# Patient Record
Sex: Female | Born: 1985 | Race: White | Hispanic: No | Smoking: Current every day smoker
Health system: Southern US, Community
[De-identification: ages and names within clinical notes are randomized; demographics above are authoritative.]

## PROBLEM LIST (undated history)

## (undated) DIAGNOSIS — F32A Depression, unspecified: Secondary | ICD-10-CM

## (undated) DIAGNOSIS — G43909 Migraine, unspecified, not intractable, without status migrainosus: Secondary | ICD-10-CM

## (undated) DIAGNOSIS — F319 Bipolar disorder, unspecified: Secondary | ICD-10-CM

## (undated) DIAGNOSIS — H269 Unspecified cataract: Secondary | ICD-10-CM

## (undated) DIAGNOSIS — F419 Anxiety disorder, unspecified: Secondary | ICD-10-CM

## (undated) DIAGNOSIS — K219 Gastro-esophageal reflux disease without esophagitis: Secondary | ICD-10-CM

## (undated) DIAGNOSIS — F99 Mental disorder, not otherwise specified: Secondary | ICD-10-CM

## (undated) DIAGNOSIS — F329 Major depressive disorder, single episode, unspecified: Secondary | ICD-10-CM

## (undated) HISTORY — PX: ABDOMINAL SURGERY: SHX537

## (undated) HISTORY — PX: TUBAL LIGATION: SHX77

## (undated) HISTORY — DX: Unspecified cataract: H26.9

## (undated) HISTORY — PX: CHOLECYSTECTOMY: SHX55

## (undated) HISTORY — PX: HERNIA REPAIR: SHX51

## (undated) HISTORY — DX: Migraine, unspecified, not intractable, without status migrainosus: G43.909

---
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
## 2006-05-02 ENCOUNTER — Emergency Department (HOSPITAL_COMMUNITY): Admission: EM | Admit: 2006-05-02 | Discharge: 2006-05-02 | Payer: Self-pay | Admitting: Emergency Medicine

## 2006-05-04 ENCOUNTER — Emergency Department (HOSPITAL_COMMUNITY): Admission: EM | Admit: 2006-05-04 | Discharge: 2006-05-04 | Payer: Self-pay | Admitting: Family Medicine

## 2007-01-03 ENCOUNTER — Emergency Department (HOSPITAL_COMMUNITY): Admission: EM | Admit: 2007-01-03 | Discharge: 2007-01-03 | Payer: Self-pay | Admitting: Emergency Medicine

## 2007-02-06 ENCOUNTER — Emergency Department (HOSPITAL_COMMUNITY): Admission: EM | Admit: 2007-02-06 | Discharge: 2007-02-06 | Payer: Self-pay | Admitting: *Deleted

## 2007-07-11 ENCOUNTER — Inpatient Hospital Stay (HOSPITAL_COMMUNITY): Admission: AD | Admit: 2007-07-11 | Discharge: 2007-07-11 | Payer: Self-pay | Admitting: Obstetrics and Gynecology

## 2007-08-01 ENCOUNTER — Inpatient Hospital Stay (HOSPITAL_COMMUNITY): Admission: AD | Admit: 2007-08-01 | Discharge: 2007-08-01 | Payer: Self-pay | Admitting: Obstetrics and Gynecology

## 2007-08-01 ENCOUNTER — Ambulatory Visit (HOSPITAL_COMMUNITY): Admission: RE | Admit: 2007-08-01 | Discharge: 2007-08-01 | Payer: Self-pay | Admitting: Neurology

## 2007-09-16 ENCOUNTER — Inpatient Hospital Stay (HOSPITAL_COMMUNITY): Admission: AD | Admit: 2007-09-16 | Discharge: 2007-09-16 | Payer: Self-pay | Admitting: Obstetrics and Gynecology

## 2007-10-05 ENCOUNTER — Emergency Department (HOSPITAL_COMMUNITY): Admission: EM | Admit: 2007-10-05 | Discharge: 2007-10-05 | Payer: Self-pay | Admitting: Emergency Medicine

## 2007-10-13 ENCOUNTER — Inpatient Hospital Stay (HOSPITAL_COMMUNITY): Admission: RE | Admit: 2007-10-13 | Discharge: 2007-10-15 | Payer: Self-pay | Admitting: Obstetrics and Gynecology

## 2008-08-26 ENCOUNTER — Emergency Department (HOSPITAL_COMMUNITY): Admission: EM | Admit: 2008-08-26 | Discharge: 2008-08-26 | Payer: Self-pay | Admitting: Emergency Medicine

## 2008-08-30 ENCOUNTER — Emergency Department (HOSPITAL_COMMUNITY): Admission: EM | Admit: 2008-08-30 | Discharge: 2008-08-30 | Payer: Self-pay | Admitting: Family Medicine

## 2008-09-15 ENCOUNTER — Emergency Department (HOSPITAL_COMMUNITY): Admission: EM | Admit: 2008-09-15 | Discharge: 2008-09-15 | Payer: Self-pay | Admitting: Emergency Medicine

## 2009-09-11 ENCOUNTER — Emergency Department (HOSPITAL_COMMUNITY): Admission: EM | Admit: 2009-09-11 | Discharge: 2009-09-11 | Payer: Self-pay | Admitting: Family Medicine

## 2009-09-30 ENCOUNTER — Emergency Department (HOSPITAL_COMMUNITY): Admission: EM | Admit: 2009-09-30 | Discharge: 2009-09-30 | Payer: Self-pay | Admitting: Family Medicine

## 2009-11-14 ENCOUNTER — Inpatient Hospital Stay (HOSPITAL_COMMUNITY): Admission: AD | Admit: 2009-11-14 | Discharge: 2009-11-15 | Payer: Self-pay | Admitting: Obstetrics and Gynecology

## 2009-12-10 ENCOUNTER — Emergency Department (HOSPITAL_COMMUNITY): Admission: EM | Admit: 2009-12-10 | Discharge: 2009-12-10 | Payer: Self-pay | Admitting: Emergency Medicine

## 2009-12-23 ENCOUNTER — Ambulatory Visit: Payer: Self-pay | Admitting: Psychiatry

## 2009-12-23 ENCOUNTER — Inpatient Hospital Stay (HOSPITAL_COMMUNITY): Admission: AD | Admit: 2009-12-23 | Discharge: 2009-12-29 | Payer: Self-pay | Admitting: Psychiatry

## 2010-03-21 ENCOUNTER — Inpatient Hospital Stay (HOSPITAL_COMMUNITY): Admission: AD | Admit: 2010-03-21 | Discharge: 2010-03-21 | Payer: Self-pay | Admitting: Obstetrics and Gynecology

## 2010-03-21 ENCOUNTER — Ambulatory Visit: Payer: Self-pay | Admitting: Advanced Practice Midwife

## 2010-04-07 ENCOUNTER — Inpatient Hospital Stay (HOSPITAL_COMMUNITY): Admission: AD | Admit: 2010-04-07 | Discharge: 2010-04-07 | Payer: Self-pay | Admitting: Obstetrics and Gynecology

## 2010-04-07 ENCOUNTER — Ambulatory Visit: Payer: Self-pay | Admitting: Nurse Practitioner

## 2010-04-20 ENCOUNTER — Inpatient Hospital Stay (HOSPITAL_COMMUNITY): Admission: AD | Admit: 2010-04-20 | Discharge: 2010-04-20 | Payer: Self-pay | Admitting: Obstetrics and Gynecology

## 2010-04-20 ENCOUNTER — Ambulatory Visit: Payer: Self-pay | Admitting: Family

## 2010-06-03 ENCOUNTER — Inpatient Hospital Stay (HOSPITAL_COMMUNITY): Admission: RE | Admit: 2010-06-03 | Discharge: 2010-06-05 | Payer: Self-pay | Admitting: Obstetrics and Gynecology

## 2010-08-05 ENCOUNTER — Ambulatory Visit (HOSPITAL_COMMUNITY)
Admission: RE | Admit: 2010-08-05 | Discharge: 2010-08-05 | Payer: Self-pay | Source: Home / Self Care | Attending: Obstetrics and Gynecology | Admitting: Obstetrics and Gynecology

## 2010-08-30 HISTORY — PX: OOPHORECTOMY: SHX86

## 2010-09-20 ENCOUNTER — Encounter: Payer: Self-pay | Admitting: Emergency Medicine

## 2010-11-09 LAB — CBC
HCT: 39.2 % (ref 36.0–46.0)
MCHC: 34.1 g/dL (ref 30.0–36.0)
RBC: 4.11 MIL/uL (ref 3.87–5.11)
WBC: 5.5 10*3/uL (ref 4.0–10.5)

## 2010-11-09 LAB — SURGICAL PCR SCREEN
MRSA, PCR: NEGATIVE
Staphylococcus aureus: NEGATIVE

## 2010-11-11 LAB — CBC
Hemoglobin: 11.9 g/dL — ABNORMAL LOW (ref 12.0–15.0)
Hemoglobin: 12.6 g/dL (ref 12.0–15.0)
MCH: 34.1 pg — ABNORMAL HIGH (ref 26.0–34.0)
MCHC: 34.8 g/dL (ref 30.0–36.0)
MCV: 96.3 fL (ref 78.0–100.0)
RBC: 3.47 MIL/uL — ABNORMAL LOW (ref 3.87–5.11)
RBC: 3.73 MIL/uL — ABNORMAL LOW (ref 3.87–5.11)
RDW: 14.2 % (ref 11.5–15.5)
WBC: 8.5 10*3/uL (ref 4.0–10.5)

## 2010-11-12 LAB — FETAL FIBRONECTIN: Fetal Fibronectin: NEGATIVE

## 2010-11-12 LAB — URINALYSIS, ROUTINE W REFLEX MICROSCOPIC
Nitrite: NEGATIVE
Specific Gravity, Urine: 1.01 (ref 1.005–1.030)
Urobilinogen, UA: 2 mg/dL — ABNORMAL HIGH (ref 0.0–1.0)

## 2010-11-13 LAB — URINALYSIS, ROUTINE W REFLEX MICROSCOPIC
Hgb urine dipstick: NEGATIVE
Ketones, ur: >80 mg/dL — AB
Nitrite: NEGATIVE
Urobilinogen, UA: 4 mg/dL — ABNORMAL HIGH (ref 0.0–1.0)
pH: 6.5 (ref 5.0–8.0)

## 2010-11-13 LAB — CBC
HCT: 37.1 % (ref 36.0–46.0)
Hemoglobin: 12.9 g/dL (ref 12.0–15.0)
MCH: 34.1 pg — ABNORMAL HIGH (ref 26.0–34.0)
MCV: 98.2 fL (ref 78.0–100.0)
WBC: 7.2 10*3/uL (ref 4.0–10.5)

## 2010-11-13 LAB — COMPREHENSIVE METABOLIC PANEL
AST: 18 U/L (ref 0–37)
Albumin: 3 g/dL — ABNORMAL LOW (ref 3.5–5.2)
Calcium: 8.5 mg/dL (ref 8.4–10.5)
Chloride: 102 mEq/L (ref 96–112)
Glucose, Bld: 79 mg/dL (ref 70–99)
Total Protein: 6.1 g/dL (ref 6.0–8.3)

## 2010-11-14 LAB — URINALYSIS, ROUTINE W REFLEX MICROSCOPIC
Bilirubin Urine: NEGATIVE
Hgb urine dipstick: NEGATIVE
Ketones, ur: NEGATIVE mg/dL
Nitrite: NEGATIVE
Urobilinogen, UA: 0.2 mg/dL (ref 0.0–1.0)
pH: 7 (ref 5.0–8.0)

## 2010-11-14 LAB — URINE MICROSCOPIC-ADD ON

## 2010-11-14 LAB — WET PREP, GENITAL: Trich, Wet Prep: NONE SEEN

## 2010-11-14 LAB — GC/CHLAMYDIA PROBE AMP, GENITAL: Chlamydia, DNA Probe: NEGATIVE

## 2010-11-14 LAB — URINE CULTURE

## 2010-11-17 LAB — COMPREHENSIVE METABOLIC PANEL
Albumin: 2.8 g/dL — ABNORMAL LOW (ref 3.5–5.2)
BUN: 4 mg/dL — ABNORMAL LOW (ref 6–23)
CO2: 21 mEq/L (ref 19–32)
Chloride: 109 mEq/L (ref 96–112)
Creatinine, Ser: 0.38 mg/dL — ABNORMAL LOW (ref 0.4–1.2)
Glucose, Bld: 89 mg/dL (ref 70–99)
Potassium: 3.2 mEq/L — ABNORMAL LOW (ref 3.5–5.1)
Sodium: 135 mEq/L (ref 135–145)

## 2010-11-17 LAB — RAPID URINE DRUG SCREEN, HOSP PERFORMED
Amphetamines: NOT DETECTED
Benzodiazepines: NOT DETECTED
Tetrahydrocannabinol: POSITIVE — AB

## 2010-11-17 LAB — URINE MICROSCOPIC-ADD ON

## 2010-11-17 LAB — URINALYSIS, ROUTINE W REFLEX MICROSCOPIC
Bilirubin Urine: NEGATIVE
Specific Gravity, Urine: 1.012 (ref 1.005–1.030)
Urobilinogen, UA: 1 mg/dL (ref 0.0–1.0)
pH: 6 (ref 5.0–8.0)

## 2010-11-17 LAB — CBC
Hemoglobin: 12.6 g/dL (ref 12.0–15.0)
MCHC: 35.9 g/dL (ref 30.0–36.0)
Platelets: ADEQUATE 10*3/uL (ref 150–400)
RBC: 3.73 MIL/uL — ABNORMAL LOW (ref 3.87–5.11)
RDW: 13 % (ref 11.5–15.5)

## 2010-11-17 LAB — TRICYCLICS SCREEN, URINE: TCA Scrn: NOT DETECTED

## 2010-11-18 LAB — POCT I-STAT, CHEM 8
BUN: <3 mg/dL — ABNORMAL LOW (ref 6–23)
Calcium, Ion: 1.03 mmol/L — ABNORMAL LOW (ref 1.12–1.32)
Chloride: 108 mEq/L (ref 96–112)
HCT: 39 % (ref 36.0–46.0)
Sodium: 138 mEq/L (ref 135–145)
TCO2: 17 mmol/L (ref 0–100)

## 2010-11-18 LAB — DIFFERENTIAL
Basophils Absolute: 0.1 10*3/uL (ref 0.0–0.1)
Basophils Relative: 1 % (ref 0–1)
Eosinophils Absolute: 0 10*3/uL (ref 0.0–0.7)
Monocytes Relative: 4 % (ref 3–12)
Neutro Abs: 6.1 10*3/uL (ref 1.7–7.7)
Neutrophils Relative %: 73 % (ref 43–77)

## 2010-11-18 LAB — BASIC METABOLIC PANEL
BUN: 3 mg/dL — ABNORMAL LOW (ref 6–23)
Calcium: 8.7 mg/dL (ref 8.4–10.5)
GFR calc non Af Amer: >60 mL/min (ref >60)
Glucose, Bld: 80 mg/dL (ref 70–99)
Potassium: 3.4 mEq/L — ABNORMAL LOW (ref 3.5–5.1)
Sodium: 137 mEq/L (ref 135–145)

## 2010-11-18 LAB — RAPID URINE DRUG SCREEN, HOSP PERFORMED
Amphetamines: NOT DETECTED
Barbiturates: NOT DETECTED
Tetrahydrocannabinol: POSITIVE — AB

## 2010-11-18 LAB — CBC
MCHC: 35.8 g/dL (ref 30.0–36.0)
MCV: 94.2 fL (ref 78.0–100.0)
RDW: 13 % (ref 11.5–15.5)

## 2010-11-18 LAB — POCT PREGNANCY, URINE: Preg Test, Ur: POSITIVE

## 2010-11-18 LAB — ETHANOL: Alcohol, Ethyl (B): <5 mg/dL (ref 0–10)

## 2010-11-22 LAB — CBC
HCT: 37 % (ref 36.0–46.0)
Hemoglobin: 12.6 g/dL (ref 12.0–15.0)
RBC: 3.87 MIL/uL (ref 3.87–5.11)
RDW: 13.4 % (ref 11.5–15.5)
WBC: 7.8 10*3/uL (ref 4.0–10.5)

## 2010-11-22 LAB — URINALYSIS, ROUTINE W REFLEX MICROSCOPIC
Glucose, UA: NEGATIVE mg/dL
Hgb urine dipstick: NEGATIVE
Specific Gravity, Urine: >1.03 — ABNORMAL HIGH (ref 1.005–1.030)
pH: 6 (ref 5.0–8.0)

## 2010-11-22 LAB — RAPID URINE DRUG SCREEN, HOSP PERFORMED
Amphetamines: NOT DETECTED
Benzodiazepines: POSITIVE — AB
Cocaine: NOT DETECTED
Opiates: NOT DETECTED
Tetrahydrocannabinol: POSITIVE — AB

## 2010-11-22 LAB — GC/CHLAMYDIA PROBE AMP, GENITAL: GC Probe Amp, Genital: NEGATIVE

## 2010-11-22 LAB — ETHANOL: Alcohol, Ethyl (B): <5 mg/dL (ref 0–10)

## 2010-12-14 LAB — URINALYSIS, ROUTINE W REFLEX MICROSCOPIC
Bilirubin Urine: NEGATIVE
Hgb urine dipstick: NEGATIVE
Specific Gravity, Urine: 1.025 (ref 1.005–1.030)
Urobilinogen, UA: 0.2 mg/dL (ref 0.0–1.0)

## 2010-12-14 LAB — PREGNANCY, URINE: Preg Test, Ur: NEGATIVE

## 2011-01-12 NOTE — Discharge Summary (Signed)
NAMEAILEEN, [name] NO.:  [id number]   MEDICAL RECORD NO.:  [id number]          PATIENT TYPE:  INP   LOCATION:  9121                          FACILITY:  [facility]   PHYSICIAN:  Huel Cote, M.D. DATE OF BIRTH:  Jun 25, 1986   DATE OF ADMISSION:  10/13/2007  DATE OF DISCHARGE:  10/15/2007                               DISCHARGE SUMMARY   DISCHARGE DIAGNOSES:  1. Term pregnancy at 39 weeks delivered.  2. Status post normal spontaneous vaginal delivery.   DISCHARGE MEDICATIONS:  1. Motrin 600 mg p.o. every 6 hours.  2. Percocet 1 to 2 tablets p.o. every 4 ours p.r.n.   DISCHARGE FOLLOWUP:  The patient to follow up in the office in 6 weeks  for her routine postpartum  examination and probably a followup  ultrasound of the right ovarian cyst which was noted in early pregnancy.   The patient is a 25 year old G2, P0-0-1-0 who is admitted at 3 plus  weeks gestation for induction of labor given term status and favorable  cervix.  Prenatal care was complicated by her positive smoker status  which she cut down and is quitting with pregnancy.  There is no other  significant problems.  She did have a right ovarian cyst that was 2.5 cm  demonstrated at the time of her prenatal care which remained stable in  size but will need to be followed up postpartum.  She had an early  positive marijuana screen in July 2008, however, no use after that time.   Prenatal labs are as follows:  Rh positive, antibody negative, RPR  nonreactive, rubella immune, Hepatitis B surface antigen negative, HIV  negative, GC and Chlamydia negative.  Group B strep negative.   Past obstetrical history in 2004 shows spontaneous miscarriage.   PAST GYNECOLOGICAL HISTORY:  History of a LEEP and PAPS were normal  after that time.  There was a right ovarian cyst noted with pregnancy as  stated and the patient does have a history of mild endometriosis and  this will be followed up postpartum  with an  ultrasound.   PAST SURGICAL HISTORY:  In 2005, she had a laparoscopy with mild  endometriosis noted.   PAST MEDICAL HISTORY:  History of seizures as a child which resolved at  78 years of age without medications since then.   ALLERGIES:  None.   MEDICATIONS:  None except prenatal vitamins.   On admission she was afebrile with stable vital signs.  Fetal heart rate  was reactive.  Cervix was 52 at -2 station.  She had rupture of  membranes performed and clear fluid was noted.  She was placed on  Pitocin and progressed and reached complete diltation that evening,  pushing well with a normal spontaneous vaginal delivery of a vigorous  female infant over a small second degree laceration.  Apgar was 8 and 9.  Weight was 6 pounds 14 ounces.  Placenta was delivered spontaneously.  Secondary laceration was repaired with 2-0 Vicryl.  Estimated blood loss  was 350  ml.  Cervix and rectum were intact.  She was then admitted for routine  postpartum  care.  Did quite well.  On postpartum day 2, her hemoglobin  was 11.5.  By postoperative day #2 she was ambulating well. Taking her  medications p.o. and was stable for discharge to home.      Huel Cote, M.D.  Electronically Signed     KR/MEDQ  D:  10/15/2007  T:  10/16/2007  Job:  [id number]

## 2011-01-12 NOTE — Consult Note (Signed)
[name], [name] NO.:  [id number]   MEDICAL RECORD NO.:  [id number]          PATIENT TYPE:  MAT   LOCATION:  MATC                          FACILITY:  [facility]   PHYSICIAN:  Marlan Palau, M.D.  DATE OF BIRTH:  February 19, 1986   DATE OF CONSULTATION:  08/01/2007  DATE OF DISCHARGE:                                 CONSULTATION   HISTORY OF PRESENT ILLNESS:  [name] is a 25 year old right-  handed white female born 1986-06-11 with a history of seizures in the  past off medication since age 65 and history of what sounds like  migraine type headaches occurring two to three times a month.  This  patient presents with onset of a right hemisensory deficit that was  first noted upon awakening at 3:00 a.m. on August 01, 2007.  The  patient went to the bathroom at time came back and went back to sleep.  The patient awakened with the same deficit that has not changed a bit.  The patient went to [facility] hospital for further evaluation.  The  patient has developed a bit of a right frontotemporal headache that is  throbbing in nature without nausea, vomiting, visual field changes.  The  patient notes no other deficits such as weakness, vision changes, speech  changes.  Gait disturbance, with the above hemisensory deficit.  The  patient has never had a similar events with her headaches previously.  The patient comes to the [facility] hospital for further evaluation.  Neurology was called for evaluation.   PAST MEDICAL HISTORY:  1. New onset right hemisensory deficit as above.  2. Functional examination.  3. Current pregnancy with [redacted] weeks gestation.  4. Probable migraine headache.  5. History of seizures off medications for the last four years without      recurrence.  6. History of asthma.   The patient's prior seizures were described as generalized tonic-clonic  events.  The patient uses an albuterol inhaler if needed and is on  prenatal vitamins, has no known  allergies, does not smoke or drink.   SOCIAL HISTORY:  The patient is single, lives in the [place] area.  Does not work, lives with a boyfriend, has had one prior  pregnancy that terminated in miscarriage.  No problems with this  pregnancy have been noted.   Family medical history notable that both parents are alive and well.  The patient's mother has migraine.  Patient has two brothers, three  sisters who are alive and well.  History of diabetes, heart disease,  cancer and seizures in the family.   REVIEW OF SYSTEMS:  Notable for no recent fevers, chills.  The patient  does note headache.  Denies neck stiffness.  Does have some right  shoulder soreness today and has had some slight shortness of breath with  the pregnancy.  Denies any chest pain.  Denies abdominal pain, troubles  controlling bowels or bladder.  Denies any blackout episodes recently  but has had syncope in years past with seizures.  The patient did report  some slight dizziness today described as a  lightheaded sensation not  vertigo.   PHYSICAL EXAMINATION:  VITALS:  Blood pressure is 110/67, heart rate 91,  respiratory rate 20, temperature afebrile.  GENERAL:  The patient is a minimally obese pregnant female who is alert,  cooperative at time examination.  HEENT:  Head is atraumatic.  EYES:  Pupils are equal, round, react to  light.  Disks are flat bilaterally.  NECK:  Supple.  No carotid bruits noted.  RESPIRATORY:  Examination is clear.  CARDIOVASCULAR:  Revealed a regular rate and rhythm.  No obvious  murmurs, rubs noted.  EXTREMITIES:  Are without significant edema.  NEUROLOGIC EXAMINATION:  CRANIAL NERVES:  As above.  Facial symmetry is  present.  The patient has good sensation of the face on the left side,  slight decrease in the forehead on the right, is normal on the lower  face on the right.  The patient splits the midline with vibratory  sensation and has again full extraocular  movements, visual fields are  full.  Speech is well enunciated not aphasic.  Good strength of facial  muscles, muscles of head turn and shoulder shrug were noted bilaterally.  Good symmetric smile noted.  Motor test reveals 05/05 strength in all  fours.  Good symmetric motor is noted throughout.  Sensory testing is  notable for some decrease in pinprick and vibratory sensation of the  right arm, normal left.  Decreased pinprick sensation on the right leg  as compared the left, but symmetric vibratory sensation of the legs.  The patient has good finger-nose-finger and heel-to-shin bilaterally.  Gait was not tested.  No drift is seen in the upper extremities.  Deep  tendon reflexes symmetric, normal.  Toes are downgoing bilaterally.   Laboratory values notable for a urinalysis with specific gravity of  1.025, pH of 6.5, 11-20 white cells, many bacteria noted.   IMPRESSION:  1. New onset of right hemisensory deficit.  2. Functional examination.  3. Current pregnancy.  4. Urinary tract infection.  5. Prior history of seizures.   This patient has a subjective deficit at this point, splits midline with  vibratory sensation of the forehead suggestive of functional deficit.  Will need to pursue further workup to rule out cerebrovascular disease  or demyelinating disease with embellishment of examination.  The patient  does have a urinary tract infection that may require treatment.   PLAN:  1. MRI study of the brain.  2. MR angiogram of intracranial vessels.   If the two above studies are unremarkable, will plan discharge the  patient home.  If abnormalities are noted.  The patient may need to be  admitted for further evaluation.   I suppose neurologic migraine does need be considered in this case as  well.   INDICATIONS:  Please.  [facility], [address] 60[address]100  Dr. Samul Dada and Rudell Cobb, M.D.  Electronically Signed    CKW/MEDQ   D:  08/01/2007  T:  08/01/2007  Job:  [id number]   cc:   Malachi Pro. Ambrose Mantle, M.D.  Fax: [phone]

## 2011-01-15 NOTE — Op Note (Signed)
NAME:  [name], [name]                      ACCOUNT NO.:  [id number]   MEDICAL RECORD NO.:  [id number]                   PATIENT TYPE:  AMB   LOCATION:  SDC                                  [facility]ILITY:  [facility]   PHYSICIAN:  Huel Cote, M.D.              DATE OF BIRTH:  1986/04/05   DATE OF PROCEDURE:  02/26/2004  DATE OF DISCHARGE:                                 OPERATIVE REPORT   PREOPERATIVE DIAGNOSES:  1. Pelvic pain unresponsive to medical management.  2. Possible right ovarian mass.   POSTOPERATIVE DIAGNOSES:  1. Pelvic pain unresponsive to medical management.  2. Possible right ovarian mass.  3. Mild endometriosis noted on pelvic sidewall and a normal right ovary.   PROCEDURE:  Diagnostic laparoscopy, ablation of mild endometriosis.   SURGEON:  Huel Cote, M.D.   ANESTHESIA:  General.   FINDINGS:  There were normal uterus, tubes and ovaries noted on the left,  the right ovary was carefully inspected and no obvious foci of abnormality  was noted.  There were two possible areas of endometriosis on the lower  right pelvic sidewall which were cauterized. These were bright pink in  nature and very small and no other endometriosis was identified in the  anterior or posterior cul-de-sac.  The appendix was normal, gallbladder was  normal.   FLUIDS:  Estimated blood loss was minimal.  Urine output 200 mL.  IV fluids  1800 mL LR.   DESCRIPTION OF PROCEDURE:  The patient was taken to the operating room where  a general anesthesia was obtained without difficulty.  She was then prepped  and draped in the normal sinus rhythm in the dorsal lithotomy position.  A  Hulka tenaculum was placed within the cervix for uterine manipulation and  the patient then had a Foley catheter placed.  Attention was then turned to  the abdomen where a small infraumbilical incision was made with the scalpel  after injection with 0.25% Marcaine.  The Veress needle was introduced into  the  peritoneal cavity and intraperitoneal placement confirmed with both  injection aspiration with normal saline. Gas flow was then applied and  pneumoperitoneum obtained with approximately 2 1/2 liters of CO2 gas.  The  Veress needle was removed and the trocar replaced into the incision. The  camera was introduced and the pelvis and abdomen inspected with findings as  previously stated. The only pathology notable was two small pink foci of  possible endometriosis on the right pelvic sidewall, one lower in the cul-de-  sac and one just under the infundibulopelvic ligament. These were cauterized  with Kleppinger cautery. The right ovary was then carefully inspected and no  obvious foci of endometriosis or dermoid plug was noted; however, it should  be noted on the ultrasound this area was less than 5 mm in size and could  possibly be in the internal ovary without being visible externally.  The  appendix was inspected and  completely within normal limits. The gallbladder  and liver appeared normal. The left pelvic sidewall had mild colonic  adhesions; however, no abnormal pathology was noted on the left ovary, tube  or cul-de-sac.  At this point, all instruments and sponges were removed from  the patient's abdomen and the pneumoperitoneum reduced. The  trocar was removed under direct visualization and the umbilical incision  closed with one deep layer of #0 Vicryl in an interrupted suture and 3-0  Vicryl in the subcuticular stitch.  The Hulka tenaculum was removed from the  vagina with no active bleeding noted and the patient was awakened and taken  to the recovery room in stable condition.                                               Huel Cote, M.D.    KR/MEDQ  D:  02/26/2004  T:  02/26/2004  Job:  [id number]

## 2011-01-15 NOTE — H&P (Signed)
NAME:  [name], [name]                      ACCOUNT NO.:  [id number]   MEDICAL RECORD NO.:  [id number]                   PATIENT TYPE:  AMB   LOCATION:  SDC                                  [facility]   PHYSICIAN:  Huel Cote, M.D.              DATE OF BIRTH:  [birth year]   DATE OF ADMISSION:  DATE OF DISCHARGE:                                HISTORY & PHYSICAL   The patient is a 25 year old G1, P0 who is coming in for a diagnostic  laparoscopy giving an ongoing issue with pelvic pain that has failed  multiple attempts at medical management.  The patient was given multiple  agents for controlling the pain including birth control pills and Depo-  Provera and has consistently relied on narcotic pain relief despite these  interventions and for this reason, is seeking a diagnostic answer to her  pelvic pain.  A pelvic ultrasounds are significant for a small 6 cm  hyperechoic focus centrally on the right ovary which is possibly a small  dermoid plug or endometrioma, however, it is very small in size and is  unclear whether this will be clearly visible on laparoscopy.   PAST MEDICAL HISTORY:  None.   PAST OBSTETRICAL HISTORY:  Spontaneous miscarriage in December of 2004.   PAST GYNECOLOGIC HISTORY:  History of CIN-II with LEEP performed and last  Pap smear in February of 2005 demonstrated ascus.   PAST SURGICAL HISTORY:  LEEP procedure only.   PHYSICAL EXAMINATION:  VITAL SIGNS:  The patient has 98.3 temperature.  Pulse 65, blood pressure 113/55.  LUNGS:  Clear.  CARDIOVASCULAR:  Regular rate and rhythm.  ABDOMEN:  Soft and nontender.  PELVIC:  She has a normal size uterus and some tenderness in the right  adnexa, although no distinguishable palpable masses.  Left adnexa palpates  normal.  There is no other evidence of pathology.   The patient was counseled as to the risks and benefits of proceeding with  surgery including bleeding, infection, possible damage to bowel  and bladder.  She was given other options for conservative management, however, given her  persistent pain on hormonal regimen, feels that she needs to proceed with  laparoscopy to rule out any significant pathology.  We will proceed with a  diagnostic laparoscopy.  The patient was counseled for possible ovarian  cystectomy on the right as well as a possible oophorectomy should any  bleeding or abnormal pathology necessitate this.  There will also be careful  inspection for any areas of endometriosis or other pathology.                                               Huel Cote, M.D.    KR/MEDQ  D:  02/25/2004  T:  02/25/2004  Job:  [id number]

## 2011-02-08 ENCOUNTER — Emergency Department (HOSPITAL_COMMUNITY)
Admission: EM | Admit: 2011-02-08 | Discharge: 2011-02-08 | Disposition: A | Discharge status: Home / Self Care | Payer: Medicaid Other | Attending: Emergency Medicine | Admitting: Emergency Medicine

## 2011-02-08 ENCOUNTER — Emergency Department (HOSPITAL_COMMUNITY): Payer: Medicaid Other

## 2011-02-08 DIAGNOSIS — G40909 Epilepsy, unspecified, not intractable, without status epilepticus: Secondary | ICD-10-CM | POA: Insufficient documentation

## 2011-02-08 DIAGNOSIS — J45909 Unspecified asthma, uncomplicated: Secondary | ICD-10-CM | POA: Insufficient documentation

## 2011-02-08 DIAGNOSIS — M549 Dorsalgia, unspecified: Secondary | ICD-10-CM | POA: Insufficient documentation

## 2011-02-08 DIAGNOSIS — G8929 Other chronic pain: Secondary | ICD-10-CM | POA: Insufficient documentation

## 2011-02-08 DIAGNOSIS — Z79899 Other long term (current) drug therapy: Secondary | ICD-10-CM | POA: Insufficient documentation

## 2011-02-08 DIAGNOSIS — R112 Nausea with vomiting, unspecified: Secondary | ICD-10-CM | POA: Insufficient documentation

## 2011-02-08 DIAGNOSIS — R1013 Epigastric pain: Secondary | ICD-10-CM | POA: Insufficient documentation

## 2011-02-08 DIAGNOSIS — R197 Diarrhea, unspecified: Secondary | ICD-10-CM | POA: Insufficient documentation

## 2011-02-08 DIAGNOSIS — F319 Bipolar disorder, unspecified: Secondary | ICD-10-CM | POA: Insufficient documentation

## 2011-02-08 LAB — COMPREHENSIVE METABOLIC PANEL
BUN: 9 mg/dL (ref 6–23)
CO2: 23 mEq/L (ref 19–32)
Chloride: 103 mEq/L (ref 96–112)
Creatinine, Ser: 0.66 mg/dL (ref 0.4–1.2)
GFR calc Af Amer: >60 mL/min (ref >60)
GFR calc non Af Amer: >60 mL/min (ref >60)
Total Bilirubin: 0.4 mg/dL (ref 0.3–1.2)

## 2011-02-08 LAB — CBC
HCT: 37.7 % (ref 36.0–46.0)
Hemoglobin: 13.8 g/dL (ref 12.0–15.0)
MCH: 32.9 pg (ref 26.0–34.0)
MCHC: 36.6 g/dL — ABNORMAL HIGH (ref 30.0–36.0)
RDW: 12.8 % (ref 11.5–15.5)

## 2011-02-08 LAB — DIFFERENTIAL
Basophils Relative: 0 % (ref 0–1)
Eosinophils Relative: 0 % (ref 0–5)
Monocytes Absolute: 0.6 10*3/uL (ref 0.1–1.0)
Monocytes Relative: 11 % (ref 3–12)
Neutro Abs: 2.4 10*3/uL (ref 1.7–7.7)

## 2011-02-08 LAB — LIPASE, BLOOD: Lipase: 13 U/L (ref 11–59)

## 2011-02-08 LAB — URINALYSIS, ROUTINE W REFLEX MICROSCOPIC
Glucose, UA: NEGATIVE mg/dL
Ketones, ur: 15 mg/dL — AB
pH: 6 (ref 5.0–8.0)

## 2011-02-08 LAB — POCT PREGNANCY, URINE: Preg Test, Ur: NEGATIVE

## 2011-05-20 LAB — URINALYSIS, ROUTINE W REFLEX MICROSCOPIC
Glucose, UA: NEGATIVE
Ketones, ur: NEGATIVE
Nitrite: NEGATIVE
Protein, ur: NEGATIVE

## 2011-05-21 LAB — CBC
HCT: 36.9
HCT: 32.8 — ABNORMAL LOW
Hemoglobin: 13
Hemoglobin: 11.5 — ABNORMAL LOW
MCHC: 35.2
MCV: 96.2
MCV: 96.1
Platelets: 149 — ABNORMAL LOW
RBC: 3.83 — ABNORMAL LOW
RBC: 3.41 — ABNORMAL LOW
RDW: 14.1
WBC: 7.9
WBC: 15.5 — ABNORMAL HIGH

## 2011-06-04 LAB — DIFFERENTIAL
Basophils Absolute: 0 10*3/uL (ref 0.0–0.1)
Basophils Relative: 0 % (ref 0–1)
Lymphocytes Relative: 17 % (ref 12–46)
Neutro Abs: 8 10*3/uL — ABNORMAL HIGH (ref 1.7–7.7)

## 2011-06-04 LAB — POCT I-STAT, CHEM 8
BUN: 5 mg/dL — ABNORMAL LOW (ref 6–23)
Chloride: 109 mEq/L (ref 96–112)
HCT: 46 % (ref 36.0–46.0)
Sodium: 141 mEq/L (ref 135–145)
TCO2: 22 mmol/L (ref 0–100)

## 2011-06-04 LAB — RAPID URINE DRUG SCREEN, HOSP PERFORMED
Amphetamines: NOT DETECTED
Benzodiazepines: POSITIVE — AB
Cocaine: NOT DETECTED
Tetrahydrocannabinol: POSITIVE — AB

## 2011-06-04 LAB — CBC
MCHC: 33.7 g/dL (ref 30.0–36.0)
Platelets: 193 10*3/uL (ref 150–400)
RDW: 13.6 % (ref 11.5–15.5)

## 2011-06-04 LAB — ETHANOL: Alcohol, Ethyl (B): <5 mg/dL (ref 0–10)

## 2011-06-07 LAB — URINALYSIS, ROUTINE W REFLEX MICROSCOPIC
Glucose, UA: NEGATIVE
Ketones, ur: NEGATIVE
Nitrite: NEGATIVE
Protein, ur: NEGATIVE
pH: 6.5

## 2011-06-07 LAB — URINE CULTURE
Colony Count: NO GROWTH
Culture: NO GROWTH

## 2011-06-07 LAB — FETAL FIBRONECTIN: Fetal Fibronectin: NEGATIVE

## 2011-06-07 LAB — URINE MICROSCOPIC-ADD ON

## 2011-06-08 LAB — URINALYSIS, ROUTINE W REFLEX MICROSCOPIC
Hgb urine dipstick: NEGATIVE
Nitrite: NEGATIVE
Protein, ur: NEGATIVE
Specific Gravity, Urine: 1.02
Urobilinogen, UA: 0.2

## 2011-06-08 LAB — URINE MICROSCOPIC-ADD ON

## 2011-06-17 LAB — URINALYSIS, ROUTINE W REFLEX MICROSCOPIC
Bilirubin Urine: NEGATIVE
Glucose, UA: NEGATIVE
Ketones, ur: NEGATIVE
Protein, ur: NEGATIVE
pH: 7

## 2011-06-17 LAB — POCT PREGNANCY, URINE: Preg Test, Ur: POSITIVE

## 2011-06-17 LAB — URINE MICROSCOPIC-ADD ON

## 2011-11-12 ENCOUNTER — Emergency Department: Payer: Self-pay | Admitting: Emergency Medicine

## 2011-12-29 ENCOUNTER — Inpatient Hospital Stay (HOSPITAL_COMMUNITY)
Admission: AD | Admit: 2011-12-29 | Discharge: 2012-01-03 | DRG: 885 | Disposition: A | Discharge status: Home / Self Care | Payer: Medicaid Other | Source: Ambulatory Visit | Attending: Psychiatry | Admitting: Psychiatry

## 2011-12-29 ENCOUNTER — Emergency Department (HOSPITAL_COMMUNITY)
Admission: EM | Admit: 2011-12-29 | Discharge: 2011-12-29 | Disposition: A | Discharge status: Other Acute Inpatient Hospital | Payer: Medicaid Other | Source: Home / Self Care | Attending: Emergency Medicine | Admitting: Emergency Medicine

## 2011-12-29 ENCOUNTER — Encounter (HOSPITAL_COMMUNITY): Payer: Self-pay | Admitting: *Deleted

## 2011-12-29 DIAGNOSIS — J45909 Unspecified asthma, uncomplicated: Secondary | ICD-10-CM | POA: Insufficient documentation

## 2011-12-29 DIAGNOSIS — F17200 Nicotine dependence, unspecified, uncomplicated: Secondary | ICD-10-CM | POA: Insufficient documentation

## 2011-12-29 DIAGNOSIS — S8010XA Contusion of unspecified lower leg, initial encounter: Secondary | ICD-10-CM | POA: Insufficient documentation

## 2011-12-29 DIAGNOSIS — X789XXA Intentional self-harm by unspecified sharp object, initial encounter: Secondary | ICD-10-CM | POA: Insufficient documentation

## 2011-12-29 DIAGNOSIS — F1010 Alcohol abuse, uncomplicated: Secondary | ICD-10-CM | POA: Insufficient documentation

## 2011-12-29 DIAGNOSIS — F1210 Cannabis abuse, uncomplicated: Secondary | ICD-10-CM | POA: Insufficient documentation

## 2011-12-29 DIAGNOSIS — R45851 Suicidal ideations: Secondary | ICD-10-CM | POA: Insufficient documentation

## 2011-12-29 DIAGNOSIS — F314 Bipolar disorder, current episode depressed, severe, without psychotic features: Secondary | ICD-10-CM | POA: Diagnosis present

## 2011-12-29 DIAGNOSIS — F319 Bipolar disorder, unspecified: Secondary | ICD-10-CM | POA: Insufficient documentation

## 2011-12-29 DIAGNOSIS — Z23 Encounter for immunization: Secondary | ICD-10-CM

## 2011-12-29 DIAGNOSIS — F332 Major depressive disorder, recurrent severe without psychotic features: Secondary | ICD-10-CM | POA: Diagnosis present

## 2011-12-29 DIAGNOSIS — S61509A Unspecified open wound of unspecified wrist, initial encounter: Secondary | ICD-10-CM | POA: Insufficient documentation

## 2011-12-29 DIAGNOSIS — F1920 Other psychoactive substance dependence, uncomplicated: Secondary | ICD-10-CM | POA: Diagnosis present

## 2011-12-29 DIAGNOSIS — F39 Unspecified mood [affective] disorder: Principal | ICD-10-CM | POA: Diagnosis present

## 2011-12-29 DIAGNOSIS — S40029A Contusion of unspecified upper arm, initial encounter: Secondary | ICD-10-CM | POA: Insufficient documentation

## 2011-12-29 DIAGNOSIS — N809 Endometriosis, unspecified: Secondary | ICD-10-CM | POA: Diagnosis present

## 2011-12-29 DIAGNOSIS — K089 Disorder of teeth and supporting structures, unspecified: Secondary | ICD-10-CM | POA: Diagnosis present

## 2011-12-29 HISTORY — DX: Mental disorder, not otherwise specified: F99

## 2011-12-29 HISTORY — DX: Anxiety disorder, unspecified: F41.9

## 2011-12-29 HISTORY — DX: Bipolar disorder, unspecified: F31.9

## 2011-12-29 LAB — RAPID URINE DRUG SCREEN, HOSP PERFORMED
Barbiturates: POSITIVE — AB
Benzodiazepines: POSITIVE — AB
Cocaine: NOT DETECTED
Tetrahydrocannabinol: POSITIVE — AB

## 2011-12-29 LAB — URINALYSIS, ROUTINE W REFLEX MICROSCOPIC
Bilirubin Urine: NEGATIVE
Hgb urine dipstick: NEGATIVE
Ketones, ur: NEGATIVE mg/dL
Protein, ur: NEGATIVE mg/dL
Urobilinogen, UA: 0.2 mg/dL (ref 0.0–1.0)

## 2011-12-29 LAB — COMPREHENSIVE METABOLIC PANEL
ALT: 29 U/L (ref 0–35)
AST: 18 U/L (ref 0–37)
Alkaline Phosphatase: 86 U/L (ref 39–117)
CO2: 26 mEq/L (ref 19–32)
Calcium: 9.4 mg/dL (ref 8.4–10.5)
Potassium: 3.2 mEq/L — ABNORMAL LOW (ref 3.5–5.1)
Sodium: 140 mEq/L (ref 135–145)
Total Protein: 6.7 g/dL (ref 6.0–8.3)

## 2011-12-29 LAB — CBC
MCV: 93.7 fL (ref 78.0–100.0)
Platelets: 147 10*3/uL — ABNORMAL LOW (ref 150–400)
RBC: 4.12 MIL/uL (ref 3.87–5.11)
RDW: 13.1 % (ref 11.5–15.5)
WBC: 5.7 10*3/uL (ref 4.0–10.5)

## 2011-12-29 LAB — DIFFERENTIAL
Basophils Absolute: 0 10*3/uL (ref 0.0–0.1)
Eosinophils Absolute: 0 10*3/uL (ref 0.0–0.7)
Eosinophils Relative: 0 % (ref 0–5)
Lymphocytes Relative: 32 % (ref 12–46)
Lymphs Abs: 1.8 10*3/uL (ref 0.7–4.0)
Neutrophils Relative %: 57 % (ref 43–77)

## 2011-12-29 LAB — PREGNANCY, URINE: Preg Test, Ur: NEGATIVE

## 2011-12-29 MED ORDER — IBUPROFEN 800 MG PO TABS
800.0000 mg | ORAL_TABLET | Freq: Once | ORAL | Status: AC
  Administered 2011-12-29: 800 mg via ORAL
  Filled 2011-12-29: qty 1

## 2011-12-29 MED ORDER — POTASSIUM CHLORIDE CRYS ER 20 MEQ PO TBCR
20.0000 meq | EXTENDED_RELEASE_TABLET | Freq: Once | ORAL | Status: AC
  Administered 2011-12-29: 20 meq via ORAL
  Filled 2011-12-29: qty 1

## 2011-12-29 NOTE — ED Notes (Signed)
Pt requesting medication for a headache. EDP notified.

## 2011-12-29 NOTE — ED Provider Notes (Signed)
History  This chart was scribed for Diane Lennert, MD by [name]. The patient was seen in room APA15/APA15. Patient's care was started at 1552.  CSN: [id number]  Arrival date & time 12/29/11  1552   First MD Initiated Contact with Patient 12/29/11 1637      Chief Complaint  Patient presents with  . Medical Clearance    (Consider location/radiation/quality/duration/timing/severity/associated sxs/prior treatment) The history is provided by the patient. No language interpreter was used.    [name] is a 26 y.o. female with a history of bipolar 1 disorder who presents to the Emergency Department complaining of 2 days of gradual onset, constant suicidal ideation with associated self-injury and depression. Pt reports that she "tried to commit suicide" two days ago by cutting her left wrist with a knife. Pt states that she has been depressed due to fighting with her boyfriend and not seeing her children. Pt reports that she has not seen her 25 year old in over a year after he was taken by his father. Pt also states that her 76 year old was taken away by the pt's mother yesterday due to the pt's behavior. Pt reports that she was admitted to the [facility] in 2010 for similar issues. Pt states that her current medication is not effective and she wants help. Pt is a current everyday smoker and reports alcohol use.  Past Medical History  Diagnosis Date  . Asthma   . Bipolar 1 disorder   . Anxiety   . Endometriosis     Past Surgical History  Procedure Date  . Cholecystectomy   . Abdominal surgery   . Tubal ligation     History reviewed. No pertinent family history.  History  Substance Use Topics  . Smoking status: Current Everyday Smoker  . Smokeless tobacco: Not on file  . Alcohol Use: Yes    OB History    Grav Para Term Preterm Abortions TAB SAB Ect Mult Living                  Review of Systems  Constitutional: Negative for fatigue.  HENT:  Negative for congestion, sinus pressure and ear discharge.   Eyes: Negative for discharge.  Respiratory: Negative for cough.   Gastrointestinal: Negative for diarrhea.  Genitourinary: Negative for frequency and hematuria.  Musculoskeletal: Negative for back pain.  Skin: Negative for rash.  Neurological: Negative for seizures.  Hematological: Negative.   Psychiatric/Behavioral: Positive for suicidal ideas and self-injury. Negative for hallucinations.  All other systems reviewed and are negative.    Allergies  Review of patient's allergies indicates no known allergies.  Home Medications  No current outpatient prescriptions on file.  Triage Vitals: BP 122/83  Pulse 70  Temp(Src) 98.3 F (36.8 C) (Oral)  Resp 20  Ht 5\' 1"  (1.549 m)  Wt 156 lb (70.761 kg)  BMI 29.48 kg/m2  SpO2 100%  LMP 12/14/2011  Physical Exam  Nursing note and vitals reviewed. Constitutional: She is oriented to person, place, and time. She appears well-developed and well-nourished.  HENT:  Head: Normocephalic and atraumatic.  Eyes: Conjunctivae are normal. Pupils are equal, round, and reactive to light.  Neck: Normal range of motion. No tracheal deviation present.  Cardiovascular: Normal rate.   No murmur heard. Pulmonary/Chest: Effort normal and breath sounds normal.  Musculoskeletal: Normal range of motion.       Bruises on arms and legs, bilaterally. Small laceration to left wrist.  Neurological: She is alert and  oriented to person, place, and time.  Skin: Skin is dry.  Psychiatric:       Depressed and suicidal    ED Course  Procedures (including critical care time)  DIAGNOSTIC STUDIES: Oxygen Saturation is 100% on room air, normal by my interpretation.    COORDINATION OF CARE: 4:46PM - Patient understands and agrees with initial ED impression and plan with expectations set for ED visit.    Results for orders placed during the hospital encounter of 12/29/11  CBC      Component Value  Range   WBC 5.7  4.0 - 10.5 (K/uL)   RBC 4.12  3.87 - 5.11 (MIL/uL)   Hemoglobin 13.4  12.0 - 15.0 (g/dL)   HCT 96.0  45.4 - 09.8 (%)   MCV 93.7  78.0 - 100.0 (fL)   MCH 32.5  26.0 - 34.0 (pg)   MCHC 34.7  30.0 - 36.0 (g/dL)   RDW 11.9  14.7 - 82.9 (%)   Platelets 147 (*) 150 - 400 (K/uL)  DIFFERENTIAL      Component Value Range   Neutrophils Relative 57  43 - 77 (%)   Neutro Abs 3.3  1.7 - 7.7 (K/uL)   Lymphocytes Relative 32  12 - 46 (%)   Lymphs Abs 1.8  0.7 - 4.0 (K/uL)   Monocytes Relative 10  3 - 12 (%)   Monocytes Absolute 0.6  0.1 - 1.0 (K/uL)   Eosinophils Relative 0  0 - 5 (%)   Eosinophils Absolute 0.0  0.0 - 0.7 (K/uL)   Basophils Relative 1  0 - 1 (%)   Basophils Absolute 0.0  0.0 - 0.1 (K/uL)  COMPREHENSIVE METABOLIC PANEL      Component Value Range   Sodium 140  135 - 145 (mEq/L)   Potassium 3.2 (*) 3.5 - 5.1 (mEq/L)   Chloride 105  96 - 112 (mEq/L)   CO2 26  19 - 32 (mEq/L)   Glucose, Bld 103 (*) 70 - 99 (mg/dL)   BUN 8  6 - 23 (mg/dL)   Creatinine, Ser 5.62  0.50 - 1.10 (mg/dL)   Calcium 9.4  8.4 - 13.0 (mg/dL)   Total Protein 6.7  6.0 - 8.3 (g/dL)   Albumin 3.7  3.5 - 5.2 (g/dL)   AST 18  0 - 37 (U/L)   ALT 29  0 - 35 (U/L)   Alkaline Phosphatase 86  39 - 117 (U/L)   Total Bilirubin 0.3  0.3 - 1.2 (mg/dL)   GFR calc non Af Amer >90  >90 (mL/min)   GFR calc Af Amer >90  >90 (mL/min)  ETHANOL      Component Value Range   Alcohol, Ethyl (B) <11  0 - 11 (mg/dL)  URINE RAPID DRUG SCREEN (HOSP PERFORMED)      Component Value Range   Opiates NONE DETECTED  NONE DETECTED    Cocaine NONE DETECTED  NONE DETECTED    Benzodiazepines POSITIVE (*) NONE DETECTED    Amphetamines NONE DETECTED  NONE DETECTED    Tetrahydrocannabinol POSITIVE (*) NONE DETECTED    Barbiturates POSITIVE (*) NONE DETECTED    No results found.     No diagnosis found.    MDM       The chart was scribed for me under my direct supervision.  I personally performed the  history, physical, and medical decision making and all procedures in the evaluation of this patient.Diane Lennert, MD 12/29/11 2146
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
## 2011-12-29 NOTE — ED Notes (Signed)
Feels suicidal,  "I tried to commit suicide. "  Has superficial lac to lt wrist 2 days ago.  Cut with knife.
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
## 2011-12-30 ENCOUNTER — Encounter (HOSPITAL_COMMUNITY): Payer: Self-pay | Admitting: *Deleted

## 2011-12-30 DIAGNOSIS — F314 Bipolar disorder, current episode depressed, severe, without psychotic features: Secondary | ICD-10-CM | POA: Diagnosis present

## 2011-12-30 DIAGNOSIS — F332 Major depressive disorder, recurrent severe without psychotic features: Secondary | ICD-10-CM | POA: Diagnosis present

## 2011-12-30 MED ORDER — ONDANSETRON 4 MG PO TBDP: 4.0000 mg | ORAL_TABLET | Freq: Four times a day (QID) | ORAL | Status: AC | PRN

## 2011-12-30 MED ORDER — ADULT MULTIVITAMIN W/MINERALS CH
1.0000 | ORAL_TABLET | Freq: Every day | ORAL | Status: DC
  Administered 2011-12-30 – 2012-01-03 (×5): 1 via ORAL
  Filled 2011-12-30 (×7): qty 1

## 2011-12-30 MED ORDER — VITAMIN B-1 100 MG PO TABS
100.0000 mg | ORAL_TABLET | Freq: Every day | ORAL | Status: DC
  Administered 2011-12-31 – 2012-01-03 (×4): 100 mg via ORAL
  Filled 2011-12-30 (×6): qty 1

## 2011-12-30 MED ORDER — LOPERAMIDE HCL 2 MG PO CAPS: 2.0000 mg | ORAL_CAPSULE | ORAL | Status: AC | PRN

## 2011-12-30 MED ORDER — PNEUMOCOCCAL VAC POLYVALENT 25 MCG/0.5ML IJ INJ
0.5000 mL | INJECTION | INTRAMUSCULAR | Status: AC
  Administered 2011-12-30: 0.5 mL via INTRAMUSCULAR

## 2011-12-30 MED ORDER — CHLORDIAZEPOXIDE HCL 25 MG PO CAPS: 25.0000 mg | ORAL_CAPSULE | Freq: Four times a day (QID) | ORAL | Status: AC | PRN

## 2011-12-30 MED ORDER — THIAMINE HCL 100 MG/ML IJ SOLN: 100.0000 mg | Freq: Once | INTRAMUSCULAR | Status: DC

## 2011-12-30 MED ORDER — CHLORDIAZEPOXIDE HCL 25 MG PO CAPS
25.0000 mg | ORAL_CAPSULE | Freq: Every day | ORAL | Status: AC
  Administered 2012-01-03: 25 mg via ORAL
  Filled 2011-12-30: qty 1

## 2011-12-30 MED ORDER — CHLORDIAZEPOXIDE HCL 25 MG PO CAPS
25.0000 mg | ORAL_CAPSULE | Freq: Three times a day (TID) | ORAL | Status: AC
  Administered 2011-12-31 – 2012-01-01 (×3): 25 mg via ORAL
  Filled 2011-12-30 (×2): qty 1

## 2011-12-30 MED ORDER — CARBAMAZEPINE ER 200 MG PO TB12
200.0000 mg | ORAL_TABLET | Freq: Two times a day (BID) | ORAL | Status: DC
  Administered 2011-12-30 – 2011-12-31 (×2): 200 mg via ORAL
  Filled 2011-12-30 (×4): qty 1

## 2011-12-30 MED ORDER — IBUPROFEN 600 MG PO TABS
600.0000 mg | ORAL_TABLET | Freq: Four times a day (QID) | ORAL | Status: DC | PRN
  Administered 2011-12-30 – 2012-01-03 (×5): 600 mg via ORAL
  Filled 2011-12-30 (×5): qty 1

## 2011-12-30 MED ORDER — CITALOPRAM HYDROBROMIDE 20 MG PO TABS
30.0000 mg | ORAL_TABLET | Freq: Every day | ORAL | Status: DC
  Administered 2011-12-30 – 2011-12-31 (×2): 30 mg via ORAL
  Filled 2011-12-30 (×3): qty 1

## 2011-12-30 MED ORDER — ALBUTEROL SULFATE HFA 108 (90 BASE) MCG/ACT IN AERS: 2.0000 | INHALATION_SPRAY | Freq: Four times a day (QID) | RESPIRATORY_TRACT | Status: DC | PRN

## 2011-12-30 MED ORDER — HYDROXYZINE HCL 25 MG PO TABS: 25.0000 mg | ORAL_TABLET | Freq: Four times a day (QID) | ORAL | Status: AC | PRN

## 2011-12-30 MED ORDER — CHLORDIAZEPOXIDE HCL 25 MG PO CAPS
25.0000 mg | ORAL_CAPSULE | Freq: Four times a day (QID) | ORAL | Status: AC
  Administered 2011-12-30 – 2011-12-31 (×6): 25 mg via ORAL
  Filled 2011-12-30 (×6): qty 1

## 2011-12-30 MED ORDER — CHLORDIAZEPOXIDE HCL 25 MG PO CAPS
25.0000 mg | ORAL_CAPSULE | ORAL | Status: AC
  Administered 2012-01-01 – 2012-01-02 (×2): 25 mg via ORAL
  Filled 2011-12-30: qty 1

## 2011-12-30 MED ORDER — ALUM & MAG HYDROXIDE-SIMETH 200-200-20 MG/5ML PO SUSP: 30.0000 mL | ORAL | Status: DC | PRN

## 2011-12-30 MED ORDER — MAGNESIUM HYDROXIDE 400 MG/5ML PO SUSP: 30.0000 mL | Freq: Every day | ORAL | Status: DC | PRN

## 2011-12-30 MED ORDER — NICOTINE 21 MG/24HR TD PT24
21.0000 mg | MEDICATED_PATCH | Freq: Every day | TRANSDERMAL | Status: DC
  Administered 2011-12-30: 21 mg via TRANSDERMAL
  Filled 2011-12-30 (×7): qty 1

## 2011-12-30 MED ORDER — ACETAMINOPHEN 325 MG PO TABS
650.0000 mg | ORAL_TABLET | Freq: Four times a day (QID) | ORAL | Status: DC | PRN
Start: 2011-12-30 — End: 2012-01-03
  Administered 2011-12-30 (×2): 650 mg via ORAL

## 2011-12-30 MED ORDER — TRAZODONE HCL 50 MG PO TABS
50.0000 mg | ORAL_TABLET | Freq: Every evening | ORAL | Status: DC | PRN
  Administered 2011-12-30 – 2011-12-31 (×4): 50 mg via ORAL
  Filled 2011-12-30 (×9): qty 1
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
## 2011-12-30 NOTE — Progress Notes (Signed)
[facility] Group Notes:  (Counselor/Nursing/MHT/Case Management/Adjunct)  12/30/2011 5:29 PM  Type of Therapy:  Group Therapy at 11:00  Participation Level:  Minimal  Participation Quality:  Attentive and Sharing  Affect:  Anxious  Cognitive:  Alert and Oriented  Insight:  Limited  Engagement in Group:  Limited  Engagement in Therapy:  Limited  Modes of Intervention:  Clarification and Socialization  Summary of Progress/Problems:  [name] participated in activity in which patients choose photographs to represent what their life would look and feel like were it in balance and another for out of balance. She chose a photo of old doors padlocked and chained together to represent her life out of balance, she feels like she is stuck behind those doors; and she chose a photo of a girl running in the park with balance to represent balance. She also shared how much she misses her 2 children who are in custody of others.   [name] 12/30/2011, 5:38 PMB[facility] Group Notes:  (Counselor/Nursing/MHT/Case Management/Adjunct)  12/30/2011 5:29 PM  Type of Therapy:  Group Therapy  Participation Level: Minimal  Participation Quality:  Inattentive and anxious  Affect:  Depressed  Cognitive:  Oriented  Insight:  None shared  Engagement in Group:  Limited  Engagement in Therapy:  None  Modes of Intervention:  Socialization and Support  Summary of Progress/Problems: [name] spent majority of group time with nurse practitioner and returned to group somewhat anxious and tearful.  Patient did not take opportunity to share what she was upset about and did not participate in discussion.   [name] 12/30/2011, 5:45 PM

## 2011-12-30 NOTE — H&P (Signed)
Psychiatric Admission Assessment Adult  Patient Identification:  [name]  Date of Evaluation:  12/30/2011  Chief Complaint:  Substance Abuse  History of Present Illness:: This is a 26 year old caucasian female, admitted to [facility] from the [facility] ED with complaints of suicidal ideations and polysubstance abuse and dependency problems. Patient reports, "I came here because I attempted suicidal by wrist cutting 3 days ago. I slit my Lt. Wrist. I just have a lot on me and and I felt overwhelmed. I feel like I have no support system. I have a 25 year old that was taken away from me. I have not seen him in a year. He is my child and he belongs with me. I also have a83 year old son that my mother took away from me because she said that I have to get hold and control of myself before I can care for another human. My mother does not help or support me. She is too involved with my step-father. I was diagnosed with Bipolar disorder way back. I was taken Seroquel 300 mg at night time then, Citalopram 30 mg daily and klonopin 1 mg 4 times daily. I have been on Klonopin since 2009. That is the only medicine that works for me. I stopped the Seroquel because it was not helping my problem. I have been taking Klonopin because it helps me, when I don't have any, I will be buy them off the streets. I also use other pills which is for my back pains, I buy them off the streets sometimes".  Mood Symptoms:  Hopelessness, Mood Swings, Sadness, SI, Worthlessness,  Depression Symptoms:  depressed mood, feelings of worthlessness/guilt, suicidal thoughts without plan, suicidal attempt,  (Hypo) Manic Symptoms:  Impulsivity, Irritable Mood,  Anxiety Symptoms:  Excessive Worry,  Psychotic Symptoms:  Hallucinations: Auditory   PTSD Symptoms: Had a traumatic exposure:  None reported.  Past Psychiatric History: Diagnosis: Bipolar affective disorder  Hospitalizations: [facility] 2  Outpatient Care:  [facility]  Substance Abuse Care: None reported  Self-Mutilation: "I cut on wrists at times"  Suicidal Attempts: "Yes, in 2010 by overdose"  Violent Behaviors: None indicated   Past Medical History:   Past Medical History  Diagnosis Date  . Asthma   . Bipolar 1 disorder   . Anxiety   . Endometriosis   . Mental disorder      Allergies:  No Known Allergies  PTA Medications: Prescriptions prior to admission  Medication Sig Dispense Refill  . albuterol (PROVENTIL HFA;VENTOLIN HFA) 108 (90 BASE) MCG/ACT inhaler Inhale 2 puffs into the lungs every 6 (six) hours as needed. For shortness of breath      . citalopram (CELEXA) 40 MG tablet Take 40 mg by mouth daily.      . clonazePAM (KLONOPIN) 1 MG tablet Take 1 mg by mouth 4 (four) times daily.        Previous Psychotropic Medications:  Medication/Dose  S eroquel 300 mg Q hs  Klonopin 1 mg qid  Citalopram 20 mg daily  Albuterol inhaler         Substance Abuse History in the last 12 months: Substance Age of 1st Use Last Use Amount Specific Type  Nicotine 13 Prior to hosp 5-6 cigarettes daily Cigarettes  Alcohol 13 Prior to hosp "I drank a pack of beer + a 1/5th liquor" Beer, liquor  Cannabis 13 Prior to hosp "I smoke several joints a day" marijuana  Opiates 19 "I use pills  Occasionally" "What ever I can get off the street" Percocet's. Oxicodone  Cocaine 19 "I have not used in a while"  Crack  Methamphetamines Denies use     LSD Denies use     Ecstasy Denies use     Benzodiazepines Denies use     Caffeine      Inhalants      Others:                          Consequences of Substance Abuse: Medical Consequences:  Liver damage, possible death by overdose Legal Consequences:  arrests, jail time Family Consequences:  Family discord   Social History: Current Place of Residence: [place], [place]   Place of Birth:  [place]  Family Members: "My son and boyfriend"  Marital Status:   Single  Children: 2  Sons:2  Daughters:0  Relationships: "I have a boyfriend"  Education:  No high school diploma  Educational Problems/Performance: "I did not finish high school"  Religious Beliefs/Practices: None reported  History of Abuse (Emotional/Phsycial/Sexual): None reported  Occupational Experiences: Unemployed.  Military History:  None.  Legal History: None reported  Hobbies/Interests: None reported  Family History:  History reviewed. No pertinent family history.  Mental Status Examination/Evaluation: Objective:  Appearance: Disheveled  Eye Contact::  Good  Speech:  Clear and Coherent  Volume:  Normal  Mood:  Depressed, stressed  Affect:  Flat  Thought Process:  Tangential  Orientation:  Full  Thought Content:  Rumination  Suicidal Thoughts:  No  Homicidal Thoughts:  No  Memory:  Immediate;   Good Recent;   Good Remote;   Fair  Judgement:  Poor  Insight:  Lacking  Psychomotor Activity:  Normal  Concentration:  Fair  Recall:  Good  Akathisia:  No  Handed:  Right  AIMS (if indicated):     Assets:  Desire for Improvement  Sleep:  Number of Hours: 4.25     Laboratory/X-Ray: None Psychological Evaluation(s)      Assessment:    AXIS I:  Substance Induced Mood Disorder and Bipolar affective disorder. AXIS II:  Deferred AXIS III:   Past Medical History  Diagnosis Date  . Asthma   . Bipolar 1 disorder   . Anxiety   . Endometriosis   . Mental disorder    AXIS IV:  economic problems, educational problems, occupational problems, other psychosocial or environmental problems and problems related to social environment AXIS V:  11-20 some danger of hurting self or others possible OR occasionally fails to maintain minimal personal hygiene OR gross impairment in communication   Treatment Plan/Recommendations: Admit for safety and stabilization. Initiate Tegretol 200 mg twice daily for mood stabilization Citalopram 30 mg daily, patients home  medications. Albuterol inhaler 2 puffs Q 6 hours PRN. Obtain Tegretol levels.   Treatment Plan Summary: Daily contact with patient to assess and evaluate symptoms and progress in treatment Current Medications:  Current Facility-Administered Medications  Medication Dose Route Frequency Provider Last Rate Last Dose  . acetaminophen (TYLENOL) tablet 650 mg  650 mg Oral Q6H PRN Cleotis Nipper, MD   650 mg at 12/30/11 0119  . alum & mag hydroxide-simeth (MAALOX/MYLANTA) 200-200-20 MG/5ML suspension 30 mL  30 mL Oral Q4H PRN Cleotis Nipper, MD      . chlordiazePOXIDE (LIBRIUM) capsule 25 mg  25 mg Oral Q6H PRN Cleotis Nipper, MD      . chlordiazePOXIDE (LIBRIUM) capsule 25 mg  25 mg Oral QID Kathi Ludwig  Konrad Saha, MD   25 mg at 12/30/11 0837   Followed by  . chlordiazePOXIDE (LIBRIUM) capsule 25 mg  25 mg Oral TID Cleotis Nipper, MD       Followed by  . chlordiazePOXIDE (LIBRIUM) capsule 25 mg  25 mg Oral BH-qamhs Cleotis Nipper, MD       Followed by  . chlordiazePOXIDE (LIBRIUM) capsule 25 mg  25 mg Oral Daily Cleotis Nipper, MD      . hydrOXYzine (ATARAX/VISTARIL) tablet 25 mg  25 mg Oral Q6H PRN Cleotis Nipper, MD      . loperamide (IMODIUM) capsule 2-4 mg  2-4 mg Oral PRN Cleotis Nipper, MD      . magnesium hydroxide (MILK OF MAGNESIA) suspension 30 mL  30 mL Oral Daily PRN Cleotis Nipper, MD      . mulitivitamin with minerals tablet 1 tablet  1 tablet Oral Daily Cleotis Nipper, MD   1 tablet at 12/30/11 [phone]  . nicotine (NICODERM CQ - dosed in mg/24 hours) patch 21 mg  21 mg Transdermal Q0600 Cleotis Nipper, MD   21 mg at 12/30/11 0834  . ondansetron (ZOFRAN-ODT) disintegrating tablet 4 mg  4 mg Oral Q6H PRN Cleotis Nipper, MD      . pneumococcal 23 valent vaccine (PNU-IMMUNE) injection 0.5 mL  0.5 mL Intramuscular Tomorrow-1000 Alyson Kuroski-Mazzei, DO      . thiamine (B-1) injection 100 mg  100 mg Intramuscular Once Cleotis Nipper, MD      . thiamine (VITAMIN B-1) tablet 100 mg  100 mg Oral Daily Cleotis Nipper, MD      . traZODone (DESYREL) tablet 50 mg  50 mg Oral QHS,MR X 1 Cleotis Nipper, MD   50 mg at 12/30/11 0121   Facility-Administered Medications Ordered in Other Encounters  Medication Dose Route Frequency Provider Last Rate Last Dose  . ibuprofen (ADVIL,MOTRIN) tablet 800 mg  800 mg Oral Once [name], MD   800 mg at 12/29/11 2101  . potassium chloride SA (K-DUR,KLOR-CON) CR tablet 20 mEq  20 mEq Oral Once [name], MD   20 mEq at 12/29/11 2153    Observation Level/Precautions:  Q 15 minutes checks for safety  Laboratory:  Reviewed ED lab findings on file.  Psychotherapy:  Group  Medications:  See lists  Routine PRN Medications:  Yes  Consultations:  None indicated at this time  Discharge Concerns:  Safety  Other:     Armandina Stammer I 5/2/201310:18 AM

## 2011-12-30 NOTE — Tx Team (Signed)
Initial Interdisciplinary Treatment Plan  PATIENT STRENGTHS: (choose at least two) Ability for insight Active sense of humor Average or above average intelligence Communication skills Motivation for treatment/growth Physical Health  PATIENT STRESSORS: Marital or family conflict Medication change or noncompliance Substance abuse   PROBLEM LIST: Problem List/Patient Goals Date to be addressed Date deferred Reason deferred Estimated date of resolution  "Get on the right medications" 12/30/11           "Try to get off of smoking and doing drugs" 12/30/11           Increased risk for suicide 12/30/11     Depression 12/30/11     Substance abuse 12/30/11                  DISCHARGE CRITERIA:  Ability to meet basic life and health needs Adequate post-discharge living arrangements Improved stabilization in mood, thinking, and/or behavior Medical problems require only outpatient monitoring Motivation to continue treatment in a less acute level of care Need for constant or close observation no longer present Reduction of life-threatening or endangering symptoms to within safe limits Safe-care adequate arrangements made Verbal commitment to aftercare and medication compliance Withdrawal symptoms are absent or subacute and managed without 24-hour nursing intervention  PRELIMINARY DISCHARGE PLAN: Attend aftercare/continuing care group Attend 12-step recovery group Outpatient therapy Participate in family therapy Return to previous living arrangement  PATIENT/FAMIILY INVOLVEMENT: This treatment plan has been presented to and reviewed with the patient, [name], and/or family member.  The patient and family have been given the opportunity to ask questions and make suggestions.  Fransico Michael [facility] 12/30/2011, 12:35 AM
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
## 2011-12-30 NOTE — Progress Notes (Signed)
Pt. Pleasant and cooperative.  Interacting with peers.  Pt. Denies SI/HI and denies A/V hallucinations.  No signs or symptoms of infection noted on left wrist.  Support given.
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
## 2011-12-31 MED ORDER — SERTRALINE HCL 25 MG PO TABS
25.0000 mg | ORAL_TABLET | Freq: Every day | ORAL | Status: DC
  Administered 2011-12-31 – 2012-01-01 (×2): 25 mg via ORAL
  Filled 2011-12-31 (×4): qty 1

## 2011-12-31 MED ORDER — BENZOCAINE 10 % MT GEL: OROMUCOSAL | Status: DC | PRN

## 2011-12-31 MED ORDER — AMOXICILLIN 500 MG PO CAPS
500.0000 mg | ORAL_CAPSULE | Freq: Three times a day (TID) | ORAL | Status: DC
  Administered 2011-12-31 – 2012-01-03 (×10): 500 mg via ORAL
  Filled 2011-12-31: qty 21
  Filled 2011-12-31 (×5): qty 1
  Filled 2011-12-31: qty 21
  Filled 2011-12-31 (×4): qty 1
  Filled 2011-12-31: qty 21
  Filled 2011-12-31 (×3): qty 1

## 2011-12-31 MED ORDER — GABAPENTIN 100 MG PO CAPS
100.0000 mg | ORAL_CAPSULE | Freq: Three times a day (TID) | ORAL | Status: DC
  Administered 2011-12-31 – 2012-01-03 (×9): 100 mg via ORAL
  Filled 2011-12-31: qty 1
  Filled 2011-12-31 (×2): qty 15
  Filled 2011-12-31 (×10): qty 1
  Filled 2011-12-31: qty 15

## 2011-12-31 NOTE — Progress Notes (Signed)
Pt pleasant on approach, sitting in dayroom interacting appropriately with peers.  No complaints voiced.  Denies SI/HI/hallucinations.  Support and encouragement offered, will continue to monitor.

## 2011-12-31 NOTE — Progress Notes (Addendum)
Slept fair last nite, appetite is improving, energy level is normal, ability to pay attention is improving, depressed 5/10 and hopeless 3/10, denies si or HI, taking meds as ordered by MD, attending group and interacting w/peers in the dayroom, eating in the DR, q77min safety checks continues and support offered Safety maintained

## 2011-12-31 NOTE — Progress Notes (Signed)
Pt laying in bed resting with eyes closed. Respirations even and unlabored. No distress noted.  

## 2011-12-31 NOTE — Progress Notes (Signed)
[facility] Adult Inpatient Family/Significant Other Suicide Prevention Education  Suicide Prevention Education:  Contact Attempts: DeeDee patient's boyfriend's sister at [phone] has been identified as the person(s) who will aid the patient in the event of a mental health crisis.  With written consent from the patient, two attempts were made to provide suicide prevention education, prior to and/or following the patient's discharge.  We were unsuccessful in providing suicide prevention education as yet; weekend staff will attempt before providing suicide prevention education directly to patient.   Date and time of first attempt: 12/31/2011 4:18 PM    [name]  12/31/2011, 4:17 PM
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
## 2011-12-31 NOTE — Treatment Plan (Signed)
Interdisciplinary Treatment Plan Update (Adult)  Date: 12/31/2011  Time Reviewed: 8:27 AM   Progress in Treatment: Attending groups: Yes Participating in groups: Yes Taking medication as prescribed: Yes Tolerating medication: Yes   Family/Significant other contact made:  Counselor to contact if suicide prevention information needed Patient understands diagnosis:  Yes As evidenced by asking for help with mood stabilization, specifically depression and anxiety   Limited insight into substance abuse issues Discussing patient identified problems/goals with staff:  Yes See below Medical problems stabilized or resolved:  Yes Denies suicidal/homicidal ideation: Yes In AM group Issues/concerns per patient self-inventory:  Not filled out Other:  New problem(s) identified: N/A  Reason for Continuation of Hospitalization: Anxiety Depression Medication stabilization  Interventions implemented related to continuation of hospitalization:  Medication trial  Monitor  effectiveness, side effects  Encourage group attendance and participation Additional comments:  Estimated length of stay:2-3 days Discharge Plan:Go stay with ex boyfriend in [place], follow up [facility]   New goal(s): N/A  Review of initial/current patient goals per problem list:   1.  Goal(s):Safely detox from benzos  Met:  No  Target date:5/5  As evidenced ZO:XWRU score of 0, stable vitals  2.  Goal (s):Eliminate SI  Met:  Yes  Target date:5/3  As evidenced EA:[id number] denies SI today  3.  Goal(s): Reduce depression and anxiety  Met:  No  Target date:5/6  As evidenced YN:[id number] will rate her depression and anxiety at a 3  4.  Goal(s): Identify comprehensive sobriety and mental wellness plan  Met:  No  Target date:5/6 As evidenced QM:VHQI report  Attendees: Patient:     Family:     Physician:  Lynann Bologna 12/31/2011 8:27 AM   Nursing: Alease Frame   12/31/2011 8:27 AM   Case Manager:   Richelle Ito, LCSW 12/31/2011 8:27 AM   Counselor:  Ronda Fairly, LCSWA 12/31/2011 8:27 AM   Other:     Other:     Other:     Other:      Scribe for Treatment Team:   Ida Rogue, 12/31/2011 8:27 AM

## 2012-01-01 DIAGNOSIS — F332 Major depressive disorder, recurrent severe without psychotic features: Secondary | ICD-10-CM

## 2012-01-01 MED ORDER — SERTRALINE HCL 50 MG PO TABS
50.0000 mg | ORAL_TABLET | Freq: Every day | ORAL | Status: DC
  Administered 2012-01-02 – 2012-01-03 (×2): 50 mg via ORAL
  Filled 2012-01-01: qty 5
  Filled 2012-01-01 (×3): qty 1

## 2012-01-01 MED ORDER — TRAZODONE HCL 100 MG PO TABS
100.0000 mg | ORAL_TABLET | Freq: Every day | ORAL | Status: DC
  Administered 2012-01-01 – 2012-01-02 (×2): 100 mg via ORAL
  Filled 2012-01-01: qty 5
  Filled 2012-01-01 (×3): qty 1

## 2012-01-01 NOTE — Progress Notes (Signed)
Pt is pleasant and cooperative. Pt wanted to know her medications and asked to have a list of her medications. RN wrote out medication for pt. Pt attends groups. Pt is currently watching a movie with her peers. Pt was offered support and encouragement. Pt receptive to treatment and safety maintained on unit.

## 2012-01-01 NOTE — Progress Notes (Signed)
[facility] Adult Inpatient Family/Significant Other Suicide Prevention Education  Suicide Prevention Education:  Education Completed; Emelia Salisbury  Boyfriends sister [phone] has been identified by the patient as the family member/significant other with whom the patient will be residing, and identified as the person(s) who will aid the patient in the event of a mental health crisis (suicidal ideations/suicide attempt).  With written consent from the patient, the family member/significant other has been provided the following suicide prevention education, prior to the and/or following the discharge of the patient.  The suicide prevention education provided includes the following:  Suicide risk factors  Suicide prevention and interventions  National Suicide Hotline telephone number  [facility] assessment telephone number  [facility] Emergency Assistance 911  [facility] and/or Residential Mobile Crisis Unit telephone number  Request made of family/significant other to:  Remove weapons (e.g., guns, rifles, knives), all items previously/currently identified as safety concern.    Remove drugs/medications (over-the-counter, prescriptions, illicit drugs), all items previously/currently identified as a safety concern.  The family member/significant other verbalizes understanding of the suicide prevention education information provided.  The family member/significant other agrees to remove the items of safety concern listed above.  Pearson Picou L 01/01/2012, 10:13 AM

## 2012-01-01 NOTE — Progress Notes (Signed)
Patient ID: [name], female   DOB: May 25, 1986, 25 y.o.   MRN: [id number] Sharena presents with a bright affect today, fully alert, says she slept well on 100 mg of trazodone last night. She thinks this is better than the 50 mg of trazodone. She scores her depression level at 2/10 today. This is on a 1-10 scale if 10 is the worst symptoms. She scores her anxiety is 0, hopelessness is 0, and she denies any suicidal thoughts. She reports her appetite is good.  She reports she has support from her grandmother to with whom she is spoken. Her other biggest support is her ex-boyfriend. She says she has gotten back together with this ex-boyfriend she plans on returning to live with him.  She is tolerating the Zoloft well with no adverse effects, and I discussed the we will increase that to 50 mg starting tomorrow. She is tolerating detox from alprazolam with no adverse as. Her anxiety is much improved on the gabapentin which she is tolerating well. She denies any tooth pain today since starting on amoxicillin.  mental status exam: Fully alert female, pleasant, cooperative, with good eye contact. Mood is neutral, and affect appropriate. Thoughts and speech are normally organized. No evidence of psychosis. No evidence of dangerous thoughts. Insight poor. Judgment fair. Impulse control normal.  Plan:Continue current plan, will increase Zoloft to 50 mg daily starting tomorrow

## 2012-01-01 NOTE — Progress Notes (Signed)
Patient ID: [name], female   DOB: Oct 13, 1985, 26 y.o.   MRN: [id number] Pt. Awake, alert, NAD.  Affect: blunted but brightens with conversation, Mood: depressed  Reviewed nursing care plan.  Pt. Denies SI/HI/AVH.  Denies withdrawal symptoms.  Pt. Attended drug abuse group this AM.  Attended afternoon group with counselor.

## 2012-01-01 NOTE — Progress Notes (Signed)
Patient ID: [name], female   DOB: 01-20-1986, 26 y.o.   MRN: [id number] Pt. attended and participated in aftercare planning group. Pt. accepted information on suicide prevention, warning signs to look for with suicide and crisis line numbers to use. The pt. agreed to call crisis line numbers if having warning signs or having thoughts of suicide. Pt. listed their current anxiety level as 2 and depression as 3 on scale of 1 to 10 with 10 being the high.
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
## 2012-01-01 NOTE — Progress Notes (Signed)
Took over Pt care at 2300. Pt worked on puzzle with peer until bedtime, then went to room for bed without incident.  No complaints voiced.  Will continue to monitor.

## 2012-01-02 LAB — CARBAMAZEPINE LEVEL, TOTAL: Carbamazepine Lvl: 0.9 ug/mL — ABNORMAL LOW (ref 4.0–12.0)

## 2012-01-02 NOTE — Progress Notes (Signed)
Pt pleasant and bright on approach, asking about her Tegretol level and why it was drawn.  Explained this to Pt and Pt verbalized understanding.  Denies SI/HI/hallucinations, no complaints voiced at this time.  Positive for evening group.  Support and encouragement offered, will continue to monitor.

## 2012-01-02 NOTE — Progress Notes (Signed)
Patient ID: [name], female   DOB: [birth year], 26 y.o.   MRN: [id number]  [facility] Group Notes:  (Counselor/Nursing/MHT/Case Management/Adjunct)  01/02/2012 1:15 PM  Type of Therapy:  Group Therapy, Dance/Movement Therapy   Participation Level:  Minimal  Participation Quality:  Attentive  Affect:  Appropriate  Cognitive:  Appropriate  Insight:  Limited  Engagement in Group:  Limited  Engagement in Therapy:  Limited  Modes of Intervention:  Clarification, Problem-solving, Role-play, Socialization and Support  Summary of Progress/Problems:  Therapist discussed how cognitive distortions can led to past behaviors of addiction. Group discussed the importance and types of supports needed to assist them in order to prevent relapse.  Pt. stated' my mother, grandmother and children are my support system.   Pt. seems to be gaining awareness of the support systems in her recovery.     [name]

## 2012-01-02 NOTE — Progress Notes (Signed)
Pt pleasant and cooperative. Pt has been complaining about her toothache and knows she needs to see a dentist when she leaves. Pt gave pt medication for toothache. Pt was offered support and encouragement. Pt receptive to treatment and safety maintained on the unit.

## 2012-01-02 NOTE — Progress Notes (Signed)
Patient ID: KATHEE TUMLIN, female   DOB: 06-05-1986, 26 y.o.   MRN: [id number] Lisabeth is fully alert, calm, cooperative, with a bright affect. She was very pleased with her progress, so she feels very good on medications, she reports she didn't sleep much better, feels better physically, and feels she can concentrate well. She is very pleased with group therapy.  Her grandmother visited her yesterday and gave her good feedback that she looked like a new person. She has spoken with her mother who is going to give her son over to her boyfriend today and she would like to be discharged home to live with them tomorrow. She will be living down the road from her mother. She is told her mother that she would like to attend attend church with her every Sunday.  Mental status exam: Fully alert female, in full contact with reality. Affect is bright, mood is neutral. Pleasant, hopeful about the future. No dangerous thoughts. She is making plans for how to relate to her mother and be a good mother herself to her young son. No evidence of dangerous thoughts. Her attendance at group therapy has been satisfactory. She is asking about followup and we discussed followup at [facility] recovery services in [place], [place].  Plan: Discharge in a.m. if stable.

## 2012-01-02 NOTE — Progress Notes (Signed)
Patient ID: [name], female   DOB: 07-Mar-1986, 26 y.o.   MRN: [id number] Pt. attended and participated in aftercare planning group. Pt. accepted information on suicide prevention, warning signs to look for with suicide and crisis line numbers to use. The pt. agreed to call crisis line numbers if having warning signs or having thoughts of suicide. Pt. listed their current anxiety level as  2 and depression as 0 on a scale of 1 to 10 with 10 being the high.

## 2012-01-02 NOTE — Progress Notes (Signed)
[facility] Group Notes:  (Counselor/Nursing/MHT/Case Management/Adjunct)  01/02/2012 11:28 AM  Type of Therapy:  Non Denominational   Participation Level:  Minimal  Participation Quality:  Appropriate and Resistant  Affect:  Anxious and Appropriate  Cognitive:  Appropriate  Insight:  Good  Engagement in Group:  Limited  Engagement in Therapy:  Group is not considered therapy  Modes of Intervention:  Non Denominational   Summary of Progress/Problems:Pt attended group but participation level was minimal. Pt needed to be redirected several times during group as she was asking what time lunch was and if group was over.    Dalia Heading 01/02/2012, 11:28 AM

## 2012-01-03 DIAGNOSIS — F1920 Other psychoactive substance dependence, uncomplicated: Secondary | ICD-10-CM

## 2012-01-03 DIAGNOSIS — F39 Unspecified mood [affective] disorder: Principal | ICD-10-CM

## 2012-01-03 MED ORDER — TRAZODONE HCL 100 MG PO TABS: 100.0000 mg | ORAL_TABLET | Freq: Every day | ORAL | Status: DC

## 2012-01-03 MED ORDER — GABAPENTIN 100 MG PO CAPS: 100.0000 mg | ORAL_CAPSULE | Freq: Three times a day (TID) | ORAL | Status: DC

## 2012-01-03 MED ORDER — SERTRALINE HCL 50 MG PO TABS: 50.0000 mg | ORAL_TABLET | Freq: Every day | ORAL | Status: DC

## 2012-01-03 MED ORDER — AMOXICILLIN 500 MG PO CAPS: 500.0000 mg | ORAL_CAPSULE | Freq: Three times a day (TID) | ORAL | Status: AC

## 2012-01-03 NOTE — Progress Notes (Signed)
[facility] Group Notes:  (Counselor/Nursing/MHT/Case Management/Adjunct)  01/03/2012 12:50 PM  Type of Therapy:  Group Therapy at 11AM  Participation Level:  Active  Participation Quality:  Attentive and Sharing  Affect:  Appropriate  Cognitive:  Appropriate  Insight:  Limited  Engagement in Group:  Good  Engagement in Therapy:  Limited  Modes of Intervention:  Clarification, Problem-solving, Socialization and Support  Summary of Progress/Problems:  Group discussion focused on what patient's see as their own obstacles to recovery.  Patient shares belief that doing things differently and not using  will be difficult to deal with due to longevity of use and the simple habitual nature of use.[name] states that in order to stay clean she will attend [facility] on a regular basis and use family supports as boyfriend, mother and neighbor all wish her to remain sober.    [name] 01/03/2012, 12:50 PM

## 2012-01-03 NOTE — Treatment Plan (Signed)
Interdisciplinary Treatment Plan Update (Adult)  Date: 01/03/2012  Time Reviewed: 8:23 AM   Progress in Treatment: Attending groups: Yes Participating in groups: Yes Taking medication as prescribed: Yes Tolerating medication: Yes   Family/Significant other contact made:  Yes, by counselor Patient understands diagnosis:  Yes Discussing patient identified problems/goals with staff:  Yes Medical problems stabilized or resolved:  Yes Denies suicidal/homicidal ideation: Yes In tx team Issues/concerns per patient self-inventory:  Tooth ache Other:  New problem(s) identified: N/A  Reason for Continuation of Hospitalization: Other; describe D/C today  Interventions implemented related to continuation of hospitalization:   Additional comments:  Estimated length of stay:  Discharge Plan: Stay with boyfriend, follow up outpt  New goal(s): N/A  Review of initial/current patient goals per problem list:   1.  Goal(s):Safely detox from benzos  Met:  Yes  Target date:5/6  As evidenced ZO:XWRU score of 0, stable vitals  2.  Goal (s):Eliminate SI  Met:  Yes  Target date:5/3 As evidenced EA:VWUJ report  3.  Goal(s):Reduce depression and anxiety  Met:  Yes  Target date:5/6  As evidenced by: Depression a 1 on self inventory, Tametra denies anxiety  4.  Goal(s): Identify comprehensive sobriety and mental wellness plan  Met:  Yes  Target date:5/6  As evidenced by: Follow up at [facility], states she has many supports to help her, family and friends  Attendees: Patient:  [name] 01/03/2012 8:23 AM  Family:     Physician:  Lupe Carney 01/03/2012 8:23 AM   Nursing:  Robbie Louis  01/03/2012 8:23 AM   Case Manager:  Richelle Ito, LCSW 01/03/2012 8:23 AM   Counselor:  Ronda Fairly, LCSWA 01/03/2012 8:23 AM   Other:  Lynann Bologna 01/03/2012 8:23 AM  Other:     Other:     Other:      Scribe for Treatment Team:   Ida Rogue, 01/03/2012 8:23 AM

## 2012-01-03 NOTE — Progress Notes (Signed)
Pt was discharged home today.  She denied any S/I H/I or A/V hallucinations.    She was given f/u appointment, rx, sample medications, hotline info booklet, and AA meetings.  She voiced understanding to all instructions provided.  She declined the need for smoking cessation materials.

## 2012-01-03 NOTE — Discharge Summary (Signed)
Physician Discharge Summary Note  Patient:  [name] is an 26 y.o., female MRN:  [id number] DOB:  1985/11/14 Patient phone:  [phone] (home)  Patient address:   [address] [place] [address],   Date of Admission:  12/29/2011 Date of Discharge:  01/03/2012   Discharge Diagnoses:  AXIS I: Mood Disorder NOS; BPAD, per Hx; Polysubstance Dependence  AXIS II: Deferred  AXIS III:  Past Medical History   Diagnosis  Date   .  Asthma    .  Bipolar 1 disorder    .  Anxiety    .  Endometriosis    .  Mental disorder    AXIS IV: Moderate  AXIS V: 45  Level of Care:  OP  Hospital Course:   First admission for [name] who presented after making superficial cuts to her arm 3 days prior to admission. She admitted to suicidal thoughts and was also asking for help with polysubstance abuse. Her mother had taken her 69-year-old son from her was instructions for her to get her life in order.  She was admitted for detox unit and started on a Librium detox protocol to detox her from benzodiazepines, Klonopin, but she had been taking regularly. She had also been prescribed Celexa, and Seroquel, which he felt was not helping her. She admitted to mood swings, irritability, and depression, along with significant anxiety.  She was started on a low dose of gabapentin to address her complaints of chronic anxiety. She tolerated this well with good relief of the anxiety. Celexa was discontinued and she was started on Zoloft which was titrated to 50 mg daily and which he tolerated well. She slept well on 100 mg of trazodone each bedtime.  She has very poor dentition and on the unit developed a [name] in her right upper molar. She was placed on amoxicillin discharged with samples for the remainder of her course of treatment. She will follow up with her own dentist.  She did well in group therapy and her participation in unit activities was appropriate. Ultimately she was communicating  regularly with her mother, who agreed to let her have her son back. She planned to move back in with her old boyfriend who was welcoming both her and her son into the home.  They will live down the road from the patient's mother.   Consults:  None  Significant Diagnostic Studies:  UDS positive for barbiturates, cannabis and benzodiazepines.   Discharge Vitals:   Blood pressure 110/72, pulse 91, temperature 97.9 F (36.6 C), temperature source Oral, resp. rate 18, height 5' 5.5" (1.664 m), weight 70.534 kg (155 lb 8 oz), last menstrual period 12/14/2011.  Mental Status Exam: See Mental Status Examination and Suicide Risk Assessment completed by Attending Physician prior to discharge.  Discharge destination:  Home  Is patient on multiple antipsychotic therapies at discharge:  No   Has Patient had three or more failed trials of antipsychotic monotherapy by history:  No  Recommended Plan for Multiple Antipsychotic Therapies: N/A  Medication List  As of 01/03/2012  1:34 PM   STOP taking these medications         citalopram 40 MG tablet      clonazePAM 1 MG tablet         TAKE these medications      Indication    albuterol 108 (90 BASE) MCG/ACT inhaler   Commonly known as: PROVENTIL HFA;VENTOLIN HFA   Inhale 2 puffs into the lungs every 6 (six)  hours as needed. For shortness of breath       amoxicillin 500 MG capsule   Commonly known as: AMOXIL   Take 1 capsule (500 mg total) by mouth every 8 (eight) hours. Dental infection       gabapentin 100 MG capsule   Commonly known as: NEURONTIN   Take 1 capsule (100 mg total) by mouth 3 (three) times daily. For anxiety.       sertraline 50 MG tablet   Commonly known as: ZOLOFT   Take 1 tablet (50 mg total) by mouth daily. For depression and anxiety.       traZODone 100 MG tablet   Commonly known as: DESYREL   Take 1 tablet (100 mg total) by mouth at bedtime. For sleep.            Follow-up Information    Follow up with [facility]  on 01/04/2012. (9:00 in the morning [tomorrow])    Contact information:   [address] [place]  [place] [address]  [phone]         Follow-up recommendations:  Activity:  unrestricted Diet:  regular  Signed: Eleanna [name] A 01/03/2012, 1:34 PM
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
## 2012-01-03 NOTE — Progress Notes (Signed)
[facility] Case Management Discharge Plan:  Will you be returning to the same living situation after discharge: No. At discharge, do you have transportation home?:Yes,  Boyfriend Do you have the ability to pay for your medications:Yes,  mental health  Interagency Information:     Release of information consent forms completed and in the chart;  Patient's signature needed at discharge.  Patient to Follow up at:  Follow-up Information    Follow up with [facility] on 01/04/2012.   Contact information:   [address] [place] [address]  [phone]         Patient denies SI/HI:   Yes,  yes    Safety Planning and Suicide Prevention discussed:  Yes,  yes  Barrier to discharge identified:No.  Summary and Recommendations:   [name] 01/03/2012, 9:48 AM

## 2012-01-06 NOTE — Progress Notes (Signed)
Patient Discharge Instructions:  After Visit Summary (AVS):   Faxed to:  01/05/2012 Face Sheet:   Faxed to:  01/05/2012 Psychiatric Admission Assessment Note:   Faxed to:  01/05/2012 Suicide Risk Assessment - Discharge Assessment:   Faxed to:  01/05/2012 Faxed/Sent to the Next Level Care provider:  01/05/2012  Faxed to [facility] @ [phone]  Heloise Purpura, Eduard Clos, 01/06/2012, 6:38 PM

## 2012-01-21 ENCOUNTER — Other Ambulatory Visit: Payer: Self-pay | Admitting: Family Medicine

## 2012-01-26 ENCOUNTER — Other Ambulatory Visit: Payer: Self-pay

## 2012-01-27 ENCOUNTER — Other Ambulatory Visit: Payer: Self-pay

## 2012-01-27 ENCOUNTER — Inpatient Hospital Stay: Admission: RE | Admit: 2012-01-27 | Payer: Self-pay | Source: Ambulatory Visit

## 2012-12-18 ENCOUNTER — Emergency Department: Payer: Self-pay | Admitting: Emergency Medicine

## 2013-09-13 ENCOUNTER — Encounter (HOSPITAL_COMMUNITY): Payer: Self-pay

## 2013-09-13 ENCOUNTER — Inpatient Hospital Stay (HOSPITAL_COMMUNITY)
Admission: AD | Admit: 2013-09-13 | Discharge: 2013-09-19 | DRG: 885 | Disposition: A | Discharge status: Home / Self Care | Payer: Medicaid Other | Source: Intra-hospital | Attending: Psychiatry | Admitting: Psychiatry

## 2013-09-13 ENCOUNTER — Emergency Department (HOSPITAL_COMMUNITY)
Admission: EM | Admit: 2013-09-13 | Discharge: 2013-09-13 | Disposition: A | Discharge status: Other Acute Inpatient Hospital | Payer: Medicaid Other | Attending: Emergency Medicine | Admitting: Emergency Medicine

## 2013-09-13 ENCOUNTER — Encounter (HOSPITAL_COMMUNITY): Payer: Self-pay | Admitting: Emergency Medicine

## 2013-09-13 DIAGNOSIS — F39 Unspecified mood [affective] disorder: Secondary | ICD-10-CM | POA: Insufficient documentation

## 2013-09-13 DIAGNOSIS — Z3202 Encounter for pregnancy test, result negative: Secondary | ICD-10-CM | POA: Insufficient documentation

## 2013-09-13 DIAGNOSIS — F332 Major depressive disorder, recurrent severe without psychotic features: Principal | ICD-10-CM | POA: Diagnosis present

## 2013-09-13 DIAGNOSIS — F1210 Cannabis abuse, uncomplicated: Secondary | ICD-10-CM | POA: Diagnosis present

## 2013-09-13 DIAGNOSIS — F17200 Nicotine dependence, unspecified, uncomplicated: Secondary | ICD-10-CM | POA: Insufficient documentation

## 2013-09-13 DIAGNOSIS — F411 Generalized anxiety disorder: Secondary | ICD-10-CM | POA: Diagnosis present

## 2013-09-13 DIAGNOSIS — Z79899 Other long term (current) drug therapy: Secondary | ICD-10-CM | POA: Insufficient documentation

## 2013-09-13 DIAGNOSIS — F319 Bipolar disorder, unspecified: Secondary | ICD-10-CM | POA: Diagnosis present

## 2013-09-13 DIAGNOSIS — J45909 Unspecified asthma, uncomplicated: Secondary | ICD-10-CM | POA: Diagnosis present

## 2013-09-13 DIAGNOSIS — R45851 Suicidal ideations: Secondary | ICD-10-CM

## 2013-09-13 DIAGNOSIS — F32A Depression, unspecified: Secondary | ICD-10-CM

## 2013-09-13 DIAGNOSIS — F329 Major depressive disorder, single episode, unspecified: Secondary | ICD-10-CM

## 2013-09-13 DIAGNOSIS — Z8742 Personal history of other diseases of the female genital tract: Secondary | ICD-10-CM | POA: Insufficient documentation

## 2013-09-13 DIAGNOSIS — F314 Bipolar disorder, current episode depressed, severe, without psychotic features: Secondary | ICD-10-CM

## 2013-09-13 LAB — CBC
HCT: 42.6 % (ref 36.0–46.0)
HEMOGLOBIN: 14.8 g/dL (ref 12.0–15.0)
MCH: 33 pg (ref 26.0–34.0)
MCHC: 34.7 g/dL (ref 30.0–36.0)
MCV: 94.9 fL (ref 78.0–100.0)
PLATELETS: 206 10*3/uL (ref 150–400)
RBC: 4.49 MIL/uL (ref 3.87–5.11)
RDW: 13.2 % (ref 11.5–15.5)
WBC: 5.6 10*3/uL (ref 4.0–10.5)

## 2013-09-13 LAB — COMPREHENSIVE METABOLIC PANEL
ALK PHOS: 106 U/L (ref 39–117)
ALT: 18 U/L (ref 0–35)
AST: 16 U/L (ref 0–37)
Albumin: 4 g/dL (ref 3.5–5.2)
BUN: 9 mg/dL (ref 6–23)
CHLORIDE: 100 meq/L (ref 96–112)
CO2: 27 meq/L (ref 19–32)
Calcium: 9.4 mg/dL (ref 8.4–10.5)
Creatinine, Ser: 0.91 mg/dL (ref 0.50–1.10)
GFR, EST NON AFRICAN AMERICAN: 86 mL/min — AB (ref >90)
GLUCOSE: 86 mg/dL (ref 70–99)
Potassium: 4.3 mEq/L (ref 3.7–5.3)
SODIUM: 139 meq/L (ref 137–147)
Total Protein: 7.6 g/dL (ref 6.0–8.3)

## 2013-09-13 LAB — POCT PREGNANCY, URINE: PREG TEST UR: NEGATIVE

## 2013-09-13 LAB — ETHANOL: Alcohol, Ethyl (B): <11 mg/dL (ref 0–11)

## 2013-09-13 LAB — RAPID URINE DRUG SCREEN, HOSP PERFORMED
AMPHETAMINES: NOT DETECTED
BARBITURATES: NOT DETECTED
Benzodiazepines: POSITIVE — AB
Cocaine: NOT DETECTED
Opiates: NOT DETECTED
TETRAHYDROCANNABINOL: POSITIVE — AB

## 2013-09-13 LAB — ACETAMINOPHEN LEVEL: Acetaminophen (Tylenol), Serum: <15 ug/mL (ref 10–30)

## 2013-09-13 LAB — SALICYLATE LEVEL: Salicylate Lvl: <2 mg/dL — ABNORMAL LOW (ref 2.8–20.0)

## 2013-09-13 MED ORDER — ONDANSETRON HCL 4 MG PO TABS: 4.0000 mg | ORAL_TABLET | Freq: Three times a day (TID) | ORAL | Status: DC | PRN

## 2013-09-13 MED ORDER — QUETIAPINE FUMARATE 25 MG PO TABS
100.0000 mg | ORAL_TABLET | Freq: Every day | ORAL | Status: DC
  Administered 2013-09-13: 100 mg via ORAL
  Filled 2013-09-13: qty 4

## 2013-09-13 MED ORDER — ALUM & MAG HYDROXIDE-SIMETH 200-200-20 MG/5ML PO SUSP: 30.0000 mL | ORAL | Status: DC | PRN

## 2013-09-13 MED ORDER — ALBUTEROL SULFATE (2.5 MG/3ML) 0.083% IN NEBU: 2.5000 mg | INHALATION_SOLUTION | Freq: Four times a day (QID) | RESPIRATORY_TRACT | Status: DC | PRN

## 2013-09-13 MED ORDER — CLONAZEPAM 0.5 MG PO TABS
1.0000 mg | ORAL_TABLET | Freq: Four times a day (QID) | ORAL | Status: DC
  Administered 2013-09-13 (×2): 1 mg via ORAL
  Filled 2013-09-13 (×2): qty 2

## 2013-09-13 MED ORDER — DULOXETINE HCL 60 MG PO CPEP
60.0000 mg | ORAL_CAPSULE | Freq: Every day | ORAL | Status: DC
  Administered 2013-09-13: 60 mg via ORAL
  Filled 2013-09-13: qty 1

## 2013-09-13 MED ORDER — ACETAMINOPHEN 325 MG PO TABS
650.0000 mg | ORAL_TABLET | ORAL | Status: DC | PRN
  Administered 2013-09-13: 650 mg via ORAL
  Filled 2013-09-13: qty 2

## 2013-09-13 MED ORDER — ALBUTEROL SULFATE HFA 108 (90 BASE) MCG/ACT IN AERS: 2.0000 | INHALATION_SPRAY | Freq: Four times a day (QID) | RESPIRATORY_TRACT | Status: DC | PRN

## 2013-09-13 MED ORDER — ZIPRASIDONE HCL 20 MG PO CAPS: 60.0000 mg | ORAL_CAPSULE | Freq: Two times a day (BID) | ORAL | Status: DC

## 2013-09-13 MED ORDER — IBUPROFEN 200 MG PO TABS: 600.0000 mg | ORAL_TABLET | Freq: Three times a day (TID) | ORAL | Status: DC | PRN

## 2013-09-13 MED ORDER — ZOLPIDEM TARTRATE 5 MG PO TABS: 5.0000 mg | ORAL_TABLET | Freq: Every evening | ORAL | Status: DC | PRN
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
## 2013-09-13 NOTE — Tx Team (Signed)
Initial Interdisciplinary Treatment Plan  PATIENT STRENGTHS: (choose at least two) Motivation for treatment/growth  PATIENT STRESSORS: Financial difficulties Loss of child, great grandmother and grandfather   PROBLEM LIST: Problem List/Patient Goals Date to be addressed Date deferred Reason deferred Estimated date of resolution  Depression 09/13/13     Suicidal Ideation 09/13/13                                                DISCHARGE CRITERIA:  Improved stabilization in mood, thinking, and/or behavior  PRELIMINARY DISCHARGE PLAN: Outpatient therapy  PATIENT/FAMIILY INVOLVEMENT: This treatment plan has been presented to and reviewed with the patient, [name], and/or family member.  The patient and family have been given the opportunity to ask questions and make suggestions.  Gretta ArabHerbin, Kristie Bracewell [facility] 09/13/2013, 11:50 PM
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
## 2013-09-13 NOTE — ED Notes (Signed)
Pt reports having suicidal thoughts and plan, denies any attempts. Pt calm and cooperative at triage. No acute distress noted.

## 2013-09-13 NOTE — ED Provider Notes (Signed)
[facility] Dr Elsie SaasJonnalagadda, bed ready.  Pt alert, content. Nad. Vitals normal.    Diane RootsKevin E Reyah Streeter, MD 09/13/13 2234

## 2013-09-13 NOTE — ED Notes (Signed)
Called for sitter and security wanded pt at triage.

## 2013-09-13 NOTE — Progress Notes (Signed)
Spoke with Dr. Denton LankSteinl before and after assessment. Reviewed disposition recommendations. Rosey BathKelly Daishia Fetterly, RN

## 2013-09-13 NOTE — Discharge Instructions (Signed)
Transfer to [facility] 

## 2013-09-13 NOTE — ED Notes (Signed)
Food tray ordered

## 2013-09-14 DIAGNOSIS — F314 Bipolar disorder, current episode depressed, severe, without psychotic features: Secondary | ICD-10-CM

## 2013-09-14 DIAGNOSIS — F1210 Cannabis abuse, uncomplicated: Secondary | ICD-10-CM

## 2013-09-14 DIAGNOSIS — R45851 Suicidal ideations: Secondary | ICD-10-CM

## 2013-09-14 DIAGNOSIS — F319 Bipolar disorder, unspecified: Secondary | ICD-10-CM | POA: Diagnosis present

## 2013-09-14 MED ORDER — TRAZODONE HCL 50 MG PO TABS
50.0000 mg | ORAL_TABLET | Freq: Every evening | ORAL | Status: DC | PRN
  Administered 2013-09-14 – 2013-09-16 (×2): 50 mg via ORAL
  Filled 2013-09-14 (×3): qty 1

## 2013-09-14 MED ORDER — ZIPRASIDONE HCL 60 MG PO CAPS
60.0000 mg | ORAL_CAPSULE | Freq: Two times a day (BID) | ORAL | Status: DC
  Administered 2013-09-14 – 2013-09-19 (×11): 60 mg via ORAL
  Filled 2013-09-14 (×15): qty 1

## 2013-09-14 MED ORDER — ACETAMINOPHEN 325 MG PO TABS
650.0000 mg | ORAL_TABLET | Freq: Four times a day (QID) | ORAL | Status: DC | PRN
Start: 2013-09-14 — End: 2013-09-19
  Administered 2013-09-19: 650 mg via ORAL
  Filled 2013-09-14: qty 2

## 2013-09-14 MED ORDER — MAGNESIUM HYDROXIDE 400 MG/5ML PO SUSP
30.0000 mL | Freq: Every day | ORAL | Status: DC | PRN
  Administered 2013-09-17 – 2013-09-18 (×2): 30 mL via ORAL

## 2013-09-14 MED ORDER — QUETIAPINE FUMARATE 100 MG PO TABS
100.0000 mg | ORAL_TABLET | Freq: Every day | ORAL | Status: DC
  Administered 2013-09-14: 100 mg via ORAL
  Filled 2013-09-14 (×5): qty 1

## 2013-09-14 MED ORDER — ALBUTEROL SULFATE HFA 108 (90 BASE) MCG/ACT IN AERS
2.0000 | INHALATION_SPRAY | Freq: Four times a day (QID) | RESPIRATORY_TRACT | Status: DC | PRN
  Administered 2013-09-14 – 2013-09-18 (×4): 2 via RESPIRATORY_TRACT
  Filled 2013-09-14: qty 6.7

## 2013-09-14 MED ORDER — DULOXETINE HCL 60 MG PO CPEP
60.0000 mg | ORAL_CAPSULE | Freq: Every day | ORAL | Status: DC
  Administered 2013-09-14 – 2013-09-19 (×6): 60 mg via ORAL
  Filled 2013-09-14 (×8): qty 1

## 2013-09-14 MED ORDER — CLONAZEPAM 1 MG PO TABS
1.0000 mg | ORAL_TABLET | Freq: Two times a day (BID) | ORAL | Status: DC
  Administered 2013-09-14 – 2013-09-17 (×7): 1 mg via ORAL
  Filled 2013-09-14 (×8): qty 1

## 2013-09-14 MED ORDER — ALUM & MAG HYDROXIDE-SIMETH 200-200-20 MG/5ML PO SUSP
30.0000 mL | ORAL | Status: DC | PRN
  Administered 2013-09-14: 30 mL via ORAL

## 2013-09-14 NOTE — ED Provider Notes (Signed)
Medical screening examination/treatment/procedure(s) were performed by non-physician practitioner and as supervising physician I was immediately available for consultation/collaboration.  EKG Interpretation   None         William Tea Collums, MD 09/14/13 0134 

## 2013-09-14 NOTE — Progress Notes (Signed)
Adult Psychoeducational Group Note  Date:  09/14/2013 Time:  10:42 PM  Group Topic/Focus:  Wrap-Up Group:   The focus of this group is to help patients review their daily goal of treatment and discuss progress on daily workbooks.  Participation Level:  Active  Participation Quality:  Appropriate and Attentive  Affect:  Appropriate  Cognitive:  Alert and Appropriate  Insight: Appropriate  Engagement in Group:  Engaged  Modes of Intervention:  Discussion, Education, Socialization and Support  Additional Comments:  Pt was active during this group. Pt rated her day at a 10, being the worst. Pt is feeling stressed and depressed about her son being taken away from her and has been thinking about her grandparents who recently passed away.   [name], [name] 09/14/2013, 10:42 PM

## 2013-09-14 NOTE — [facility] Group Notes (Signed)
[facility] LCSW Group Therapy  Feelings Around Relapse 1:15 -2:30        09/14/2013  4:29 PM   Type of Therapy:  Group Therapy  Participation Level:  Appropriate  Participation Quality:  Appropriate  Affect:  Appropriate  Cognitive:  Attentive Appropriate  Insight:  Developing/Improving  Engagement in Therapy: Developing/Improving  Modes of Intervention:  Discussion Exploration Problem-Solving Supportive  Summary of Progress/Problems:  The topic for today was feelings around relapse.    Patient processed feelings toward relapse and was able to relate to peers. She shared relapsing for her would be to becoming suicidal.  Patient identified coping skills that can be used to prevent a relapse.   Wynn BankerHodnett, Enza Shone Hairston 09/14/2013 4:29 PM
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
## 2013-09-14 NOTE — Progress Notes (Signed)
Patient ID: [name], female   DOB: 06/03/1986, 28 y.o.   MRN: [id number] D: Pt is awake and active on the unit this PM. Pt endorses passive SI but she is able to contract for safety. Pt rates their depression at 10 and hopelessness at 7. Pt's mood is anxious and her affect is depressed. Pt writes that she plans to "get my son back and find a place to live." Pt is attending groups and is cooperative with staff.   A: Encouraged pt to discuss feelings with staff and administered medication per MD orders. Writer also encouraged pt to participate in groups.  R: Writer will continue to monitor. 15 minute checks are ongoing for safety.
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
## 2013-09-14 NOTE — [facility] Group Notes (Signed)
.  [facility]   09/14/2013 10:03 AM    Participation Quality:  Appropraite  Mood/Affect:  Appropriate  Depression Rating:  10  Anxiety Rating:  10  Thoughts of Suicide:  No  Will you contract for safety?   NA  Current AVH:  No  Plan for Discharge/Comments:  Patient attended discharge planning group and actively participated in group.  Patient reports she is being evicted from her home.  [facility]  CSW provided all participants with daily workbook.   Transportation Means: Patient has transportation.   Supports:  Patient has a support system.   Meranda Dechaine, Joesph JulyQuylle Hairston
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
## 2013-09-14 NOTE — [facility] Suicide Risk Assessment (Signed)
[facility] INPATIENT:  Family/Significant Other Suicide Prevention Education  Suicide Prevention Education:  Patient Refusal for Family/Significant Other Suicide Prevention Education: The patient [name] has refused to provide written consent for family/significant other to be provided Family/Significant Other Suicide Prevention Education during admission and/or prior to discharge.  Physician notified.  [name], [name] 09/14/2013, 4:31 PM

## 2013-09-14 NOTE — Progress Notes (Signed)
Recreation Therapy Notes  Date: 01.16.2015 Time: 2:45pm Location: [address] Dayroom   Group Topic: Leisure Education  Goal Area(s) Addresses:  Patient will identify positive leisure activities.  Patient will identify one positive benefit of participation in leisure activities.   Behavioral Response: Appropriate, Observation  Intervention: Game  Activity: Leisure ABC's. As a group patients were asked to identify leisure activities to correspond with each letter of the alphabet.   Education:  Leisure Education, PharmacologistCoping Skills, Discharge Planning  Education Outcome: Acknowledges understanding  Clinical Observations/Feedback: Patient attended group session, but did not participate in activity. Additionally patient made no contributions to group discussion, but appeared to actively listen as she maintained appropriate eye contact with speaker.   Marykay Lexenise L Sharmel Ballantine, LRT/CTRS  Jearl KlinefelterBlanchfield, Chaske Paskett L 09/14/2013 4:15 PM

## 2013-09-15 MED ORDER — QUETIAPINE FUMARATE 200 MG PO TABS
200.0000 mg | ORAL_TABLET | Freq: Every day | ORAL | Status: DC
  Administered 2013-09-15 – 2013-09-17 (×3): 200 mg via ORAL
  Filled 2013-09-15 (×5): qty 1

## 2013-09-15 NOTE — Progress Notes (Signed)
[facility] MD Progress Note  09/15/2013 3:27 PM [name]  MRN:  [id number] Subjective:  Patient stated she was tired today despite sleeping "Ok", she felt like she needed a nap.  Appetite is "good", depression remains high due to her homeless state and not having her 27 yo son.  Denies hallucinations.  Attends groups and stays in the dayroom but socializes little. Diagnosis:   DSM5:  Substance/Addictive Disorders:  Alcohol Related Disorder - Moderate (303.90) and Cannabis Use Disorder - Severe (304.30)  Axis I: Anxiety Disorder NOS, Bipolar, Depressed and Substance Abuse Axis II: Deferred Axis III:  Past Medical History  Diagnosis Date  . Asthma   . Bipolar 1 disorder   . Anxiety   . Endometriosis   . Mental disorder    Axis IV: economic problems, housing problems, other psychosocial or environmental problems, problems related to social environment and problems with primary support group Axis V: 41-50 serious symptoms  ADL's:  Intact  Sleep: Good  Appetite:  Good  Suicidal Ideation:  Plan:  vague Intent:  yes Means:  none Homicidal Ideation:  Denies   Psychiatric Specialty Exam: Review of Systems  Constitutional: Negative.   HENT: Negative.   Eyes: Negative.   Respiratory: Negative.   Cardiovascular: Negative.   Gastrointestinal: Negative.   Genitourinary: Negative.   Musculoskeletal: Negative.   Skin: Negative.   Neurological: Negative.   Endo/Heme/Allergies: Negative.   Psychiatric/Behavioral: Positive for depression and suicidal ideas. The patient is nervous/anxious.     Blood pressure 102/70, pulse 88, temperature 98 F (36.7 C), temperature source Oral, resp. rate 15, height 5' 0.5" (1.537 m), weight 86.183 kg (190 lb), last menstrual period 09/13/2013.Body mass index is 36.48 kg/(m^2).  General Appearance: Casual  Eye Contact::  Fair  Speech:  Slow  Volume:  Decreased  Mood:  Anxious and Depressed  Affect:  Congruent  Thought Process:  Coherent   Orientation:  Full (Time, Place, and Person)  Thought Content:  Rumination  Suicidal Thoughts:  Yes.  with intent/plan  Homicidal Thoughts:  No  Memory:  Immediate;   Fair Recent;   Fair Remote;   Fair  Judgement:  Poor  Insight:  Lacking  Psychomotor Activity:  Decreased  Concentration:  Fair  Recall:  Fair  Akathisia:  No  Handed:  Right  AIMS (if indicated):     Assets:  Leisure Time Physical Health Resilience  Sleep:      Current Medications: Current Facility-Administered Medications  Medication Dose Route Frequency Provider Last Rate Last Dose  . acetaminophen (TYLENOL) tablet 650 mg  650 mg Oral Q6H PRN Lurena Nida, NP      . albuterol (PROVENTIL HFA;VENTOLIN HFA) 108 (90 BASE) MCG/ACT inhaler 2 puff  2 puff Inhalation Q6H PRN Lurena Nida, NP   2 puff at 09/14/13 2141  . alum & mag hydroxide-simeth (MAALOX/MYLANTA) 200-200-20 MG/5ML suspension 30 mL  30 mL Oral Q4H PRN Lurena Nida, NP   30 mL at 09/14/13 2142  . clonazePAM (KLONOPIN) tablet 1 mg  1 mg Oral BID Durward Parcel, MD   1 mg at 09/15/13 0826  . DULoxetine (CYMBALTA) DR capsule 60 mg  60 mg Oral Daily Lurena Nida, NP   60 mg at 09/15/13 0826  . magnesium hydroxide (MILK OF MAGNESIA) suspension 30 mL  30 mL Oral Daily PRN Lurena Nida, NP      . QUEtiapine (SEROQUEL) tablet 200 mg  200 mg Oral QHS Waylan Boga, NP      .  traZODone (DESYREL) tablet 50 mg  50 mg Oral QHS PRN Lurena Nida, NP   50 mg at 09/14/13 2232  . ziprasidone (GEODON) capsule 60 mg  60 mg Oral BID WC Lurena Nida, NP   60 mg at 09/15/13 6629    Lab Results:  Results for orders placed during the hospital encounter of 09/13/13 (from the past 48 hour(s))  ACETAMINOPHEN LEVEL     Status: None   Collection Time    09/13/13  3:54 PM      Result Value Range   Acetaminophen (Tylenol), Serum <15.0  10 - 30 ug/mL   Comment:            THERAPEUTIC CONCENTRATIONS VARY     SIGNIFICANTLY. A RANGE OF 10-30     ug/mL MAY BE AN  EFFECTIVE     CONCENTRATION FOR MANY PATIENTS.     HOWEVER, SOME ARE BEST TREATED     AT CONCENTRATIONS OUTSIDE THIS     RANGE.     ACETAMINOPHEN CONCENTRATIONS     >150 ug/mL AT 4 HOURS AFTER     INGESTION AND >50 ug/mL AT 12     HOURS AFTER INGESTION ARE     OFTEN ASSOCIATED WITH TOXIC     REACTIONS.  CBC     Status: None   Collection Time    09/13/13  3:54 PM      Result Value Range   WBC 5.6  4.0 - 10.5 K/uL   RBC 4.49  3.87 - 5.11 MIL/uL   Hemoglobin 14.8  12.0 - 15.0 g/dL   HCT 42.6  36.0 - 46.0 %   MCV 94.9  78.0 - 100.0 fL   MCH 33.0  26.0 - 34.0 pg   MCHC 34.7  30.0 - 36.0 g/dL   RDW 13.2  11.5 - 15.5 %   Platelets 206  150 - 400 K/uL  COMPREHENSIVE METABOLIC PANEL     Status: Abnormal   Collection Time    09/13/13  3:54 PM      Result Value Range   Sodium 139  137 - 147 mEq/L   Potassium 4.3  3.7 - 5.3 mEq/L   Chloride 100  96 - 112 mEq/L   CO2 27  19 - 32 mEq/L   Glucose, Bld 86  70 - 99 mg/dL   BUN 9  6 - 23 mg/dL   Creatinine, Ser 0.91  0.50 - 1.10 mg/dL   Calcium 9.4  8.4 - 10.5 mg/dL   Total Protein 7.6  6.0 - 8.3 g/dL   Albumin 4.0  3.5 - 5.2 g/dL   AST 16  0 - 37 U/L   ALT 18  0 - 35 U/L   Alkaline Phosphatase 106  39 - 117 U/L   Total Bilirubin <0.2 (*) 0.3 - 1.2 mg/dL   Comment: REPEATED TO VERIFY   GFR calc non Af Amer 86 (*) >90 mL/min   GFR calc Af Amer >90  >90 mL/min   Comment: (NOTE)     The eGFR has been calculated using the CKD EPI equation.     This calculation has not been validated in all clinical situations.     eGFR's persistently <90 mL/min signify possible Chronic Kidney     Disease.  ETHANOL     Status: None   Collection Time    09/13/13  3:54 PM      Result Value Range   Alcohol, Ethyl (B) <11  0 - 11  mg/dL   Comment:            LOWEST DETECTABLE LIMIT FOR     SERUM ALCOHOL IS 11 mg/dL     FOR MEDICAL PURPOSES ONLY  SALICYLATE LEVEL     Status: Abnormal   Collection Time    09/13/13  3:54 PM      Result Value Range    Salicylate Lvl <2.3 (*) 2.8 - 20.0 mg/dL  URINE RAPID DRUG SCREEN (HOSP PERFORMED)     Status: Abnormal   Collection Time    09/13/13  4:53 PM      Result Value Range   Opiates NONE DETECTED  NONE DETECTED   Cocaine NONE DETECTED  NONE DETECTED   Benzodiazepines POSITIVE (*) NONE DETECTED   Amphetamines NONE DETECTED  NONE DETECTED   Tetrahydrocannabinol POSITIVE (*) NONE DETECTED   Barbiturates NONE DETECTED  NONE DETECTED   Comment:            DRUG SCREEN FOR MEDICAL PURPOSES     ONLY.  IF CONFIRMATION IS NEEDED     FOR ANY PURPOSE, NOTIFY LAB     WITHIN 5 DAYS.                LOWEST DETECTABLE LIMITS     FOR URINE DRUG SCREEN     Drug Class       Cutoff (ng/mL)     Amphetamine      1000     Barbiturate      200     Benzodiazepine   300     Tricyclics       762     Opiates          300     Cocaine          300     THC              50  POCT PREGNANCY, URINE     Status: None   Collection Time    09/13/13  5:00 PM      Result Value Range   Preg Test, Ur NEGATIVE  NEGATIVE   Comment:            THE SENSITIVITY OF THIS     METHODOLOGY IS >24 mIU/mL    Physical Findings: AIMS: Facial and Oral Movements Muscles of Facial Expression: None, normal Lips and Perioral Area: None, normal Jaw: None, normal Tongue: None, normal,Extremity Movements Upper (arms, wrists, hands, fingers): None, normal Lower (legs, knees, ankles, toes): None, normal, Trunk Movements Neck, shoulders, hips: None, normal, Overall Severity Severity of abnormal movements (highest score from questions above): None, normal Incapacitation due to abnormal movements: None, normal Patient's awareness of abnormal movements (rate only patient's report): No Awareness, Dental Status Current problems with teeth and/or dentures?: No Does patient usually wear dentures?: No  CIWA:    COWS:     Treatment Plan Summary: Daily contact with patient to assess and evaluate symptoms and progress in treatment Medication  management  Plan:  Review of chart, vital signs, medications, and notes. 1-Individual and group therapy 2-Medication management for depression and anxiety:  Medications reviewed with the patient and her Seroquel was increased to 200 mg due to her slow start to sleep 3-Coping skills for depression, anxiety 4-Continue crisis stabilization and management 5-Address health issues--monitoring vital signs, stable 6-Treatment plan in progress to prevent relapse of depression and anxiety  Medical Decision Making Problem Points:  Established problem, stable/improving (1) and Review of  psycho-social stressors (1) Data Points:  Review of medication regiment & side effects (2)  I certify that inpatient services furnished can reasonably be expected to improve the patient's condition.   Waylan Boga, [place] 09/15/2013, 3:27 PM I agree with findings and treatment plan of this patient

## 2013-09-15 NOTE — Progress Notes (Signed)
[facility] Group Notes:  (Nursing/MHT/Case Management/Adjunct)  Date:  09/15/2013  Time:  10:12 PM  Type of Therapy:  Group Therapy  Participation Level:  Active  Participation Quality:  Appropriate  Affect:  Appropriate  Cognitive:  Appropriate  Insight:  Appropriate  Engagement in Group:  Engaged  Modes of Intervention:  Discussion  Summary of Progress/Problems:The pt expressed that she had a good day.  Octavio Mannshigpen, Javion Holmer Lee 09/15/2013, 10:12 PM

## 2013-09-15 NOTE — [facility] Group Notes (Signed)
[facility] Group Notes: (Clinical Social Work)   09/15/2013      Type of Therapy:  Group Therapy   Participation Level:  Did Not Attend    Latrish Mogel Grossman-Orr, LCSW 09/15/2013, 4:32 PM     

## 2013-09-15 NOTE — Progress Notes (Signed)
Patient ID: [name], female   DOB: 05/09/1986, 28 y.o.   MRN: [id number] D)   Has been in the dayroom this evening, talking to select peers, affect is sad, flat, conversation is minimal.  Requested to use her inhaler, Stated was feeling a little SOB, which was relieved.  Was concerned she wouldn't fall asleep with seroquel but wanted to try it alone, came back later to request trazodone, and has been resting. A)  Will continue to monitor for safety, continue POC R)  Safety maintained.

## 2013-09-15 NOTE — Progress Notes (Signed)
Psychoeducational Group Note  Date:  09/15/2013 Time: 1015  Group Topic/Focus:  Identifying Needs:   The focus of this group is to help patients identify their personal needs that have been historically problematic and identify healthy behaviors to address their needs.  Participation Level:  Active  Participation Quality:  Appropriate  Affect:  Appropriate  Cognitive:  Appropriate  Insight:  Engaged  Engagement in Group:  Engaged  Additional Comments:    09/15/2013,3:24 PM Tramaine Sauls, Joie BimlerPatricia Lynn

## 2013-09-15 NOTE — Progress Notes (Signed)
D) Pt has been attending the groups and interaction with her peers. Rates her depression at a 5 and her hopelessness at a 2. Does not verbalize a lot in the groups but does pay attention and listens. Denies SI and HI. A) Given support, reassurance and praise. Encouragement given. Provided with a 1:1 R) Denies SI and HI.

## 2013-09-16 NOTE — Progress Notes (Signed)
Psychoeducational Group Note  Date:  09/16/2013 Time:  1015  Group Topic/Focus:  Making Healthy Choices:   The focus of this group is to help patients identify negative/unhealthy choices they were using prior to admission and identify positive/healthier coping strategies to replace them upon discharge.  Participation Level:  Active  Participation Quality:  Appropriate  Affect:  Appropriate  Cognitive:  Oriented  Insight:  Engaged  Engagement in Group:  Improving  Additional Comments:    Jame Morrell A 09/16/2013  

## 2013-09-16 NOTE — Progress Notes (Signed)
Psychoeducational Group Note  Date: 09/16/2013 Time:  0930  Group Topic/Focus:  Gratefulness:  The focus of this group is to help patients identify what two things they are most grateful for in their lives. What helps ground them and to center them on their work to their recovery.  Participation Level:  Active  Participation Quality:  Appropriate  Affect:  Appropriate  Cognitive:  Oriented  Insight:  Improving  Engagement in Group:  Engaged  Additional Comments:    Gianah Batt A  

## 2013-09-16 NOTE — Progress Notes (Signed)
[facility] Group Notes:  (Nursing/MHT/Case Management/Adjunct)  Date:  09/16/2013  Time:  5:01 PM  Type of Therapy:  Psychoeducational Skills  Participation Level:  Minimal  Participation Quality:  Appropriate and Attentive  Affect:  Appropriate  Cognitive:  Appropriate  Insight:  Appropriate  Engagement in Group:  Engaged  Modes of Intervention:  Activity  Summary of Progress/Problems: Pts did an activity where they wrote down 5 unique qualities about themselves and had to guess who those qualities belonged to. Pts were able to share things they wouldn't normally have shared and learn about their peers.   [name] 09/16/2013, 5:01 PM 

## 2013-09-16 NOTE — Progress Notes (Signed)
[facility] Group Notes:  (Nursing/MHT/Case Management/Adjunct)  Date:  09/16/2013  Time:  11:57 PM  Type of Therapy:  Group Therapy  Participation Level:  Active  Participation Quality:  Appropriate  Affect:  Appropriate  Cognitive:  Appropriate  Insight:  Appropriate  Engagement in Group:  Engaged  Modes of Intervention:  Discussion  Summary of Progress/ProblemsThe patient said that she did not attend any groups today.  Octavio Mannshigpen, Audley Hinojos Lee 09/16/2013, 11:57 PM

## 2013-09-16 NOTE — [facility] Group Notes (Signed)
[facility] Group Notes: (Clinical Social Work)   09/16/2013      Type of Therapy:  Group Therapy   Participation Level:  Did Not Attend    Robinson Brinkley Grossman-Orr, LCSW 09/16/2013, 4:47 PM     

## 2013-09-16 NOTE — Progress Notes (Signed)
Patient ID: [name], female   DOB: 11/28/1985, 28 y.o.   MRN: [id number] D)   Has been quiet this evening, minimal interaction, affect is flat, depressed.  Attended group, came out and was waiting for hs seroquel so that she could go back to her room and go to bed. Wanted trazodone with increased dose of seroquel, stated just wanted to sleep.  Encouraged her to try the increased dose of seroquel alone, and she was able to fall asleep. A)  Wioll continue to try to develop rapport, [facility] R)  Safety maintained.

## 2013-09-16 NOTE — Progress Notes (Signed)
D) Pt has attended the groups and interacts with her peers appropriately. Denies SI and HI. Rates her depression at a 3 and her hopelessness at a 1. Pt pays attention and is attentive to all that goes on but is quiet and pleasant. States that she is feeling better overall. A) Given support, reassurance and praise. Encouraged to do her packet and to write out the 30 positives about herself. R) Denies SI and HI.

## 2013-09-16 NOTE — Progress Notes (Signed)
Patient ID: [name], female   DOB: October 22, 1985, 28 y.o.   MRN: [id number] [facility] MD Progress Note  09/16/2013 1:29 PM [name]  MRN:  [id number] Subjective:  Patient stated her sleep was fine but still lying in her bed, appetite is "good".  Depression is better because she is trying not to think of everything--her homelessness and loss of son.  She remains in denial, encouraged her to address her issues and facilitate a plan. Diagnosis:   DSM5:  Substance/Addictive Disorders:  Alcohol Related Disorder - Moderate (303.90) and Cannabis Use Disorder - Severe (304.30)  Axis I: Anxiety Disorder NOS, Bipolar, Depressed and Substance Abuse Axis II: Deferred Axis III:  Past Medical History  Diagnosis Date  . Asthma   . Bipolar 1 disorder   . Anxiety   . Endometriosis   . Mental disorder    Axis IV: economic problems, housing problems, other psychosocial or environmental problems, problems related to social environment and problems with primary support group Axis V: 41-50 serious symptoms  ADL's:  Intact  Sleep: Good  Appetite:  Good  Suicidal Ideation:  Plan:  vague Intent:  yes Means:  none Homicidal Ideation:  Denies   Psychiatric Specialty Exam: Review of Systems  Constitutional: Negative.   HENT: Negative.   Eyes: Negative.   Respiratory: Negative.   Cardiovascular: Negative.   Gastrointestinal: Negative.   Genitourinary: Negative.   Musculoskeletal: Negative.   Skin: Negative.   Neurological: Negative.   Endo/Heme/Allergies: Negative.   Psychiatric/Behavioral: Positive for depression and suicidal ideas. The patient is nervous/anxious.     Blood pressure 129/70, pulse 106, temperature 98.3 F (36.8 C), temperature source Oral, resp. rate 18, height 5' 0.5" (1.537 m), weight 86.183 kg (190 lb), last menstrual period 09/13/2013.Body mass index is 36.48 kg/(m^2).  General Appearance: Casual  Eye Contact::  Fair  Speech:  Slow  Volume:  Decreased  Mood:   Anxious and Depressed  Affect:  Congruent  Thought Process:  Coherent  Orientation:  Full (Time, Place, and Person)  Thought Content:  Rumination  Suicidal Thoughts:  Yes.  with intent/plan  Homicidal Thoughts:  No  Memory:  Immediate;   Fair Recent;   Fair Remote;   Fair  Judgement:  Poor  Insight:  Lacking  Psychomotor Activity:  Decreased  Concentration:  Fair  Recall:  Fair  Akathisia:  No  Handed:  Right  AIMS (if indicated):     Assets:  Leisure Time Physical Health Resilience  Sleep:  Number of Hours: 6.75   Current Medications: Current Facility-Administered Medications  Medication Dose Route Frequency Provider Last Rate Last Dose  . acetaminophen (TYLENOL) tablet 650 mg  650 mg Oral Q6H PRN Kristeen Mans, NP      . albuterol (PROVENTIL HFA;VENTOLIN HFA) 108 (90 BASE) MCG/ACT inhaler 2 puff  2 puff Inhalation Q6H PRN Kristeen Mans, NP   2 puff at 09/14/13 2141  . alum & mag hydroxide-simeth (MAALOX/MYLANTA) 200-200-20 MG/5ML suspension 30 mL  30 mL Oral Q4H PRN Kristeen Mans, NP   30 mL at 09/14/13 2142  . clonazePAM (KLONOPIN) tablet 1 mg  1 mg Oral BID Nehemiah Settle, MD   1 mg at 09/16/13 7829  . DULoxetine (CYMBALTA) DR capsule 60 mg  60 mg Oral Daily Kristeen Mans, NP   60 mg at 09/16/13 5621  . magnesium hydroxide (MILK OF MAGNESIA) suspension 30 mL  30 mL Oral Daily PRN Kristeen Mans, NP      .  QUEtiapine (SEROQUEL) tablet 200 mg  200 mg Oral QHS Nanine MeansJamison Lord, NP   200 mg at 09/15/13 2119  . traZODone (DESYREL) tablet 50 mg  50 mg Oral QHS PRN Kristeen MansFran E Hobson, NP   50 mg at 09/14/13 2232  . ziprasidone (GEODON) capsule 60 mg  60 mg Oral BID WC Kristeen MansFran E Hobson, NP   60 mg at 09/16/13 [id number]    Lab Results:  No results found for this or any previous visit (from the past 48 hour(s)).  Physical Findings: AIMS: Facial and Oral Movements Muscles of Facial Expression: None, normal Lips and Perioral Area: None, normal Jaw: None, normal Tongue: None,  normal,Extremity Movements Upper (arms, wrists, hands, fingers): None, normal Lower (legs, knees, ankles, toes): None, normal, Trunk Movements Neck, shoulders, hips: None, normal, Overall Severity Severity of abnormal movements (highest score from questions above): None, normal Incapacitation due to abnormal movements: None, normal Patient's awareness of abnormal movements (rate only patient's report): No Awareness, Dental Status Current problems with teeth and/or dentures?: No Does patient usually wear dentures?: No  CIWA:    COWS:     Treatment Plan Summary: Daily contact with patient to assess and evaluate symptoms and progress in treatment Medication management  Plan:  Review of chart, vital signs, medications, and notes. 1-Individual and group therapy 2-Medication management for depression and anxiety:  Medications reviewed with the patient and no changes made  3-Coping skills for depression, anxiety 4-Continue crisis stabilization and management 5-Address health issues--monitoring vital signs, stable 6-Treatment plan in progress to prevent relapse of depression and anxiety  Medical Decision Making Problem Points:  Established problem, stable/improving (1) and Review of psycho-social stressors (1) Data Points:  Review of medication regiment & side effects (2)  I certify that inpatient services furnished can reasonably be expected to improve the patient's condition.   Nanine MeansLORD, JAMISON, PMH-NP 09/16/2013, 1:29 PM I agree with findings and treatment plan of this patient.

## 2013-09-17 MED ORDER — CLONAZEPAM 1 MG PO TABS
1.0000 mg | ORAL_TABLET | Freq: Two times a day (BID) | ORAL | Status: DC | PRN
  Administered 2013-09-17 – 2013-09-19 (×4): 1 mg via ORAL
  Filled 2013-09-17 (×4): qty 1

## 2013-09-17 MED ORDER — DOXEPIN HCL 25 MG PO CAPS
25.0000 mg | ORAL_CAPSULE | Freq: Every evening | ORAL | Status: DC | PRN
  Administered 2013-09-17: 25 mg via ORAL
  Filled 2013-09-17 (×5): qty 1

## 2013-09-17 NOTE — Progress Notes (Signed)
Pt up to the med window for hs meds.  Pt reports she is doing better this evening.  She informed Clinical research associatewriter that she had med changes for this evening.  Upon checking pt's chart, pt had indeed some new orders that had been put in since the beginning of shift.  Pt was given meds plus a prn dose of klonopin.  Pt denies SI/HI/AV at this time.  Pt attended evening group and has been observed in the dayroom watching TV and talking with her peers.  Support and encouragement offered.  Safety maintained with q15 minute checks.

## 2013-09-17 NOTE — Progress Notes (Signed)
Patient ID: [name], female   DOB: 06/06/1986, 28 y.o.   MRN: [id number] D)   Attended group this evening, stated wasn't really a football fan but spent some of the evening in the dayroom watching the game.  Affect is flat, face vacant of expression, sad, minimal interaction seen with peers, staff.  Working on a plan for discharge, but uncertain. A)  Will continue to monitor for safety, continue POC R)  Safety maintained.

## 2013-09-17 NOTE — [facility] Group Notes (Signed)
[facility] LCSW Aftercare Discharge Planning Group Note   09/17/2013  8:45 AM  Participation Quality:  Did Not Attend - pt sleeping in their room  Tanasia Budzinski Horton, LCSW 09/17/2013 9:44 AM        

## 2013-09-17 NOTE — Progress Notes (Signed)
Adult Psychoeducational Group Note  Date:  09/17/2013 Time:  10:34 PM  Group Topic/Focus:  Wrap-Up Group:   The focus of this group is to help patients review their daily goal of treatment and discuss progress on daily workbooks.  Participation Level:  Active  Participation Quality:  Appropriate  Affect:  Appropriate  Cognitive:  Appropriate  Insight: Appropriate  Engagement in Group:  Engaged  Modes of Intervention:  Support  Additional Comments:  Pt stated that she was able to talk to the doctor and fix her meds and though she did not go to any of the groups today but wrap-up that she plans to do so tomorrow. Pt was given support to do just that, attend more groups  Jabez Molner 09/17/2013, 10:34 PM

## 2013-09-17 NOTE — Tx Team (Signed)
Interdisciplinary Treatment Plan Update (Adult)  Date: 09/17/2013  Time Reviewed:  9:45 AM  Progress in Treatment: Attending groups: Yes Participating in groups:  Yes Taking medication as prescribed:  Yes Tolerating medication:  Yes Family/Significant othe contact made: No, pt refused Patient understands diagnosis:  Yes Discussing patient identified problems/goals with staff:  Yes Medical problems stabilized or resolved:  Yes Denies suicidal/homicidal ideation: Yes Issues/concerns per patient self-inventory:  Yes Other:  New problem(s) identified: N/A  Discharge Plan or Barriers: Pt [facility] for medication management and therapy.   Reason for Continuation of Hospitalization: Anxiety Depression Medication Stabilization  Comments: N/A  Estimated length of stay: 3 days  For review of initial/current patient goals, please see plan of care.  Attendees: Patient:     Family:     Physician:  Dr. Javier GlazierJohnalagadda 09/17/2013 10:07 AM   Nursing:   Marzetta Boardhrista Dopson, RN 09/17/2013 10:07 AM   Clinical Social Worker:  Reyes Ivanhelsea Horton, LCSW 09/17/2013 10:07 AM   Other: Claudette Headonrad Withrow, PA 09/17/2013 10:07 AM   Other:  Sherrye PayorValerie Noch, care coordination 09/17/2013 10:07 AM   Other:  Quintella ReichertBeverly Knight, RN 09/17/2013 10:07 AM   Other:  Neill Loftarol Davis, RN 09/17/2013 10:07 AM   Other:    Other:    Other:    Other:    Other:    Other:     Scribe for Treatment Team:   Carmina MillerHorton, Denean Pavon Nicole, 09/17/2013 10:07 AM

## 2013-09-17 NOTE — Progress Notes (Addendum)
D:  Patient's self inventory sheet, patient has poor sleep, good appetite, normal energy level, good attention span.  Rated depression and hopeless #2, anxiety 6.  Denied withdrawals.  Denied SI.  Denied physical problems.  Patient plans to stay on medications and get her kids back.  No discharge plans.  No problems taking medications after discharge. A:  Medications administered per MD orders.  Emotional support and encouragement given patient. R:  Denied SI and HI.  Contracts for safety.  Denied A/V hallucinations.  Will continue to monitor patient for safety with 15 minute checks.  Safety maintained. Patient stated she has been very sleepy today, did not sleep well last night.  Not able to go to groups today.

## 2013-09-17 NOTE — [facility] Group Notes (Signed)
[facility] LCSW Group Therapy  09/17/2013 1:15 PM   Type of Therapy:  Group Therapy  Participation Level:  Did Not Attend - pt sleeping in her room  Diane IvanChelsea Horton, LCSW 09/17/2013 3:34 PM

## 2013-09-17 NOTE — Progress Notes (Signed)
Recreation Therapy Notes  Date: 01.19.2015 Time: 3:00pm Location: [address] Day[address]   Group Topic: Coping Skills  Goal Area(s) Addresses:  Patient will identify benefit of using coping skill.  Patient will identify ability to positively change life by using coping skills.   Behavioral Response: Did not attend.   Marykay Lexenise L Alexsandra Shontz, LRT/CTRS  Narayan Scull L 09/17/2013 5:03 PM

## 2013-09-17 NOTE — Progress Notes (Signed)
Patient ID: [name], female   DOB: [birth year], 28 y.o.   MRN: [id number] [facility] MD Progress Note  09/17/2013 6:42 PM [name] Threats  MRN:  [id number] Subjective:   Pt reports worsening anxiety and sleep. Pt also asked for Klonopin to be increased from 1mg  bid to 1mg  qid (informed pt that this was not an option at this time). Pt stated that Vistaril has not worked and that she has tried "every antidepressant there is and the Cymbalta helps some, but I've been on it a month and I need more than what I'm on." Pt mentioned that "abilify, prozac, zoloft, paxil, celexa, wellbutrin...none of those worked.Peggye [name] tried all of those". Pt rates depression and anxiety both at 9/10. Denies SI, HI, and AVH.  Diagnosis:   DSM5:  Substance/Addictive Disorders:  Alcohol Related Disorder - Moderate (303.90) and Cannabis Use Disorder - Severe (304.30)  Axis I: Anxiety Disorder NOS, Bipolar, Depressed and Substance Abuse Axis II: Deferred Axis III:  Past Medical History  Diagnosis Date  . Asthma   . Bipolar 1 disorder   . Anxiety   . Endometriosis   . Mental disorder    Axis IV: economic problems, housing problems, other psychosocial or environmental problems, problems related to social environment and problems with primary support group Axis V: 41-50 serious symptoms  ADL's:  Intact  Sleep: Good  Appetite:  Good  Suicidal Ideation:  Denies Homicidal Ideation:  Denies   Psychiatric Specialty Exam: Review of Systems  Constitutional: Negative.   HENT: Negative.   Eyes: Negative.   Respiratory: Negative.   Cardiovascular: Negative.   Gastrointestinal: Negative.   Genitourinary: Negative.   Musculoskeletal: Negative.   Skin: Negative.   Neurological: Negative.   Endo/Heme/Allergies: Negative.   Psychiatric/Behavioral: Positive for depression and suicidal ideas. The patient is nervous/anxious and has insomnia.     Blood pressure 109/74, pulse 112, temperature 97.7 F (36.5 C),  temperature source Oral, resp. rate 16, height 5' 0.5" (1.537 m), weight 86.183 kg (190 lb), last menstrual period 09/13/2013.Body mass index is 36.48 kg/(m^2).  General Appearance: Casual  Eye Contact::  Fair  Speech:  Normal rate rhythm and volume, no language deficits  Volume:  Decreased  Mood:  Anxious and Depressed  Affect:  Congruent  Thought Process:  Coherent  Orientation:  Full (Time, Place, and Person)  Thought Content:  Rumination  Suicidal Thoughts:  No  Homicidal Thoughts:  No  Memory:  Immediate;   Fair Recent;   Fair Remote;   Fair, patient she fund of knowledge  Judgement:  Poor  Insight:  Lacking  Psychomotor Activity:  Decreased. patient has no abnormal muscular skeletal problems  Concentration:  Fair  Recall:  Fair  Akathisia:  No  Handed:  Right  AIMS (if indicated):     Assets:  Leisure Time Physical Health Resilience  Sleep:  Number of Hours: 6.75   Current Medications: Current Facility-Administered Medications  Medication Dose Route Frequency Provider Last Rate Last Dose  . acetaminophen (TYLENOL) tablet 650 mg  650 mg Oral Q6H PRN Kristeen Mans, NP      . albuterol (PROVENTIL HFA;VENTOLIN HFA) 108 (90 BASE) MCG/ACT inhaler 2 puff  2 puff Inhalation Q6H PRN Kristeen Mans, NP   2 puff at 09/16/13 2107  . alum & mag hydroxide-simeth (MAALOX/MYLANTA) 200-200-20 MG/5ML suspension 30 mL  30 mL Oral Q4H PRN Kristeen Mans, NP   30 mL at 09/14/13 2142  . clonazePAM (KLONOPIN) tablet 1 mg  1  mg Oral BID Nehemiah SettleJanardhaha R Dominie Benedick, MD   1 mg at 09/17/13 1623  . DULoxetine (CYMBALTA) DR capsule 60 mg  60 mg Oral Daily Kristeen MansFran E Hobson, NP   60 mg at 09/17/13 [id number]  . magnesium hydroxide (MILK OF MAGNESIA) suspension 30 mL  30 mL Oral Daily PRN Kristeen MansFran E Hobson, NP   30 mL at 09/17/13 1625  . QUEtiapine (SEROQUEL) tablet 200 mg  200 mg Oral QHS Nanine MeansJamison Lord, NP   200 mg at 09/16/13 2107  . traZODone (DESYREL) tablet 50 mg  50 mg Oral QHS PRN Kristeen MansFran E Hobson, NP   50 mg at  09/16/13 2203  . ziprasidone (GEODON) capsule 60 mg  60 mg Oral BID WC Kristeen MansFran E Hobson, NP   60 mg at 09/17/13 1622    Lab Results:  No results found for this or any previous visit (from the past 48 hour(s)).  Physical Findings: AIMS: Facial and Oral Movements Muscles of Facial Expression: None, normal Lips and Perioral Area: None, normal Jaw: None, normal Tongue: None, normal,Extremity Movements Upper (arms, wrists, hands, fingers): None, normal Lower (legs, knees, ankles, toes): None, normal, Trunk Movements Neck, shoulders, hips: None, normal, Overall Severity Severity of abnormal movements (highest score from questions above): None, normal Incapacitation due to abnormal movements: None, normal Patient's awareness of abnormal movements (rate only patient's report): No Awareness, Dental Status Current problems with teeth and/or dentures?: No Does patient usually wear dentures?: No  CIWA:  CIWA-Ar Total: 1 COWS:  COWS Total Score: 5  Treatment Plan Summary: Daily contact with patient to assess and evaluate symptoms and progress in treatment Medication management  Plan:  Review of chart, vital signs, medications, and notes. 1-Individual and group therapy 2-Medication management for depression and anxiety:  Medications reviewed with the patient. Added doxepin 25mg  qhs PRN for worsening insomnia. 3-Coping skills for depression, anxiety 4-Continue crisis stabilization and management 5-Address health issues--monitoring vital signs, stable 6-Treatment plan in progress to prevent relapse of depression and anxiety  Medical Decision Making Problem Points:  Established problem, worsening (2) and Review of psycho-social stressors (1) Data Points:  Review of medication regiment & side effects (2) Review of new medications or change in dosage (2)  I certify that inpatient services furnished can reasonably be expected to improve the patient's condition.   Beau FannyWithrow, John C,  FNP-BC 09/17/2013, 6:42 PM  Reviewed the information documented and agree with the treatment plan.  Zebulen Simonis,JANARDHAHA R. 09/18/2013 6:33 PM

## 2013-09-18 MED ORDER — QUETIAPINE FUMARATE 300 MG PO TABS
300.0000 mg | ORAL_TABLET | Freq: Every day | ORAL | Status: DC
  Administered 2013-09-18: 300 mg via ORAL
  Filled 2013-09-18 (×3): qty 1

## 2013-09-18 MED ORDER — DOXEPIN HCL 50 MG PO CAPS
50.0000 mg | ORAL_CAPSULE | Freq: Every evening | ORAL | Status: DC | PRN
  Administered 2013-09-18: 50 mg via ORAL
  Filled 2013-09-18 (×6): qty 1

## 2013-09-18 NOTE — Progress Notes (Signed)
D:  Patient's self inventory form, patient has fair sleep, good appetite, normal energy, good attention span.  Rated depression and hopeless 2, anxiety 5.  Denied withdrawals.  Denied SI.  No physical problems.  After discharge, plans to stay on her medications and get her children back.   Plans to return home after discharge.  Needs financial assistance to purchase medications after discharge. A:  Medications administered per MD orders.  Emotional support and encouragement given patient. R:  Denied SI and HI.  Denied A/V hallucinations.  Denied pain.  Will continue to monitor patient for safety with 15 minute checks.  Safety maintained. Patient talked to MD and understands she will be discharged tomorrow.  Patient does not feel she is ready to go home because she is not ready to have to deal with trying to get her children back.

## 2013-09-18 NOTE — Progress Notes (Signed)
Adult Psychoeducational Group Note  Date:  09/18/2013 Time:  8:00 pm  Group Topic/Focus:  Wrap-Up Group:   The focus of this group is to help patients review their daily goal of treatment and discuss progress on daily workbooks.  Participation Level:  Active  Participation Quality:  Appropriate and Sharing  Affect:  Appropriate  Cognitive:  Appropriate  Insight: Appropriate  Engagement in Group:  Engaged  Modes of Intervention:  Discussion, Education, Socialization and Support  Additional Comments:  Pt stated that she is in the hospital due to her depression and anxiety. Pt stated she is supposed to go home tomorrow but that she is not ready. Pt stated that she is honest and kind.   Laural BenesJohnson, Quamesha Mullet 09/18/2013, 11:53 PM

## 2013-09-18 NOTE — Progress Notes (Signed)
Recreation Therapy Notes  Animal-Assisted Activity/Therapy (AAA/T) Program Checklist/Progress Notes Patient Eligibility Criteria Checklist & Daily Group note for Rec Tx Intervention  Date: 01.20.2015 Time: 2:45pm [address]: [address]    AAA/T Program Assumption of Risk Form signed by Patient/ or Parent Legal Guardian yes  Patient is free of allergies or sever asthma yes  Patient reports no fear of animals yes  Patient reports no history of cruelty to animals yes   Patient understands his/her participation is voluntary yes  Patient washes hands before animal contact yes  Patient washes hands after animal contact yes  Behavioral Response: Appropriate  Education: Hand Washing, Appropriate Animal Interaction   Education Outcome: Acknowledges understanding   Clinical Observations/Feedback: Patient interacted appropriately with therapy dog team and peers during session.   Marykay Lexenise L Lannah Koike, LRT/CTRS  Jearl KlinefelterBlanchfield, Damon Hargrove L 09/18/2013 4:27 PM

## 2013-09-18 NOTE — Progress Notes (Signed)
Patient ID: [name], female   DOB: 04-07-1986, 28 y.o.   MRN: [id number] Patient ID: [name], female   DOB: 12/20/1985, 28 y.o.   MRN: [id number] [facility] MD Progress Note  09/18/2013 12:39 PM [name]  MRN:  [id number] Subjective:   Patient stated that she continued without symptoms of depression, anxiety and sleep disturbance. Patient stated that she has been tossing and turning in her bed. Patient rates her depression 5 at 10 and anxiety 9/10. Patient has suicidal thoughts but contracts for safety while in the hospital. Patient seems to be seeking more medication but his symptoms are not responding to and has a questionable medication seeking behavior. Patient has been taking her antidepressant medication Cymbalta which helped her in the past. Patient stated that "abilify, prozac, zoloft, paxil, celexa, wellbutrin...none of those worked.[name] tried all of those".   Diagnosis:   DSM5:  Substance/Addictive Disorders:  Alcohol Related Disorder - Moderate (303.90) and Cannabis Use Disorder - Severe (304.30)  Axis I: Anxiety Disorder NOS, Bipolar, Depressed and Substance Abuse Axis II: Deferred Axis III:  Past Medical History  Diagnosis Date  . Asthma   . Bipolar 1 disorder   . Anxiety   . Endometriosis   . Mental disorder    Axis IV: economic problems, housing problems, other psychosocial or environmental problems, problems related to social environment and problems with primary support group Axis V: 41-50 serious symptoms  ADL's:  Intact  Sleep: Good  Appetite:  Good  Suicidal Ideation:  Denies Homicidal Ideation:  Denies   Psychiatric Specialty Exam: Review of Systems  Constitutional: Negative.   HENT: Negative.   Eyes: Negative.   Respiratory: Negative.   Cardiovascular: Negative.   Gastrointestinal: Negative.   Genitourinary: Negative.   Musculoskeletal: Negative.   Skin: Negative.   Neurological: Negative.   Endo/Heme/Allergies: Negative.    Psychiatric/Behavioral: Positive for depression and suicidal ideas. The patient is nervous/anxious and has insomnia.     Blood pressure 108/66, pulse 112, temperature 98.2 F (36.8 C), temperature source Oral, resp. rate 20, height 5' 0.5" (1.537 m), weight 86.183 kg (190 lb), last menstrual period 09/13/2013.Body mass index is 36.48 kg/(m^2).  General Appearance: Casual  Eye Contact::  Fair  Speech:  Slow  Volume:  Decreased  Mood:  Anxious and Depressed  Affect:  Congruent  Thought Process:  Coherent  Orientation:  Full (Time, Place, and Person)  Thought Content:  Rumination  Suicidal Thoughts:  No  Homicidal Thoughts:  No  Memory:  Immediate;   Fair Recent;   Fair Remote;   Fair  Judgement:  Poor  Insight:  Lacking  Psychomotor Activity:  Decreased  Concentration:  Fair  Recall:  Fair  Akathisia:  No  Handed:  Right  AIMS (if indicated):     Assets:  Leisure Time Physical Health Resilience  Sleep:  Number of Hours: 6.5   Current Medications: Current Facility-Administered Medications  Medication Dose Route Frequency Provider Last Rate Last Dose  . acetaminophen (TYLENOL) tablet 650 mg  650 mg Oral Q6H PRN Kristeen Mans, NP      . albuterol (PROVENTIL HFA;VENTOLIN HFA) 108 (90 BASE) MCG/ACT inhaler 2 puff  2 puff Inhalation Q6H PRN Kristeen Mans, NP   2 puff at 09/16/13 2107  . alum & mag hydroxide-simeth (MAALOX/MYLANTA) 200-200-20 MG/5ML suspension 30 mL  30 mL Oral Q4H PRN Kristeen Mans, NP   30 mL at 09/14/13 2142  . clonazePAM (KLONOPIN) tablet 1 mg  1  mg Oral BID PRN Beau FannyJohn C Withrow, FNP   1 mg at 09/17/13 2201  . doxepin (SINEQUAN) capsule 25 mg  25 mg Oral QHS,MR X 1 Beau FannyJohn C Withrow, FNP   25 mg at 09/17/13 2201  . DULoxetine (CYMBALTA) DR capsule 60 mg  60 mg Oral Daily Kristeen MansFran E Hobson, NP   60 mg at 09/18/13 0840  . magnesium hydroxide (MILK OF MAGNESIA) suspension 30 mL  30 mL Oral Daily PRN Kristeen MansFran E Hobson, NP   30 mL at 09/17/13 1625  . QUEtiapine (SEROQUEL) tablet  200 mg  200 mg Oral QHS Nanine MeansJamison Lord, NP   200 mg at 09/17/13 2201  . ziprasidone (GEODON) capsule 60 mg  60 mg Oral BID WC Kristeen MansFran E Hobson, NP   60 mg at 09/18/13 [id number]    Lab Results:  No results found for this or any previous visit (from the past 48 hour(s)).  Physical Findings: AIMS: Facial and Oral Movements Muscles of Facial Expression: None, normal Lips and Perioral Area: None, normal Jaw: None, normal Tongue: None, normal,Extremity Movements Upper (arms, wrists, hands, fingers): None, normal Lower (legs, knees, ankles, toes): None, normal, Trunk Movements Neck, shoulders, hips: None, normal, Overall Severity Severity of abnormal movements (highest score from questions above): None, normal Incapacitation due to abnormal movements: None, normal Patient's awareness of abnormal movements (rate only patient's report): No Awareness, Dental Status Current problems with teeth and/or dentures?: No Does patient usually wear dentures?: No  CIWA:  CIWA-Ar Total: 1 COWS:  COWS Total Score: 5  Treatment Plan Summary: Daily contact with patient to assess and evaluate symptoms and progress in treatment Medication management  Plan:  Review of chart, vital signs, medications, and notes. 1-Individual and group therapy 2-Medication management for depression and anxiety:   Medications reviewed with the patient.  Increase doxepin 50 mg qhs for insomnia. And continue to start the medication as prescribed at Geodon 60 mg for mood swings, increase Seroquel 300 mg and clonazepam 1 mg twice daily  3-Coping skills for depression, anxiety 4-Continue crisis stabilization and management 5-Address health issues--monitoring vital signs, stable 6-Treatment plan in progress to prevent relapse of depression and anxiety  Medical Decision Making Problem Points:  Established problem, worsening (2) and Review of psycho-social stressors (1) Data Points:  Review of medication regiment & side effects (2) Review  of new medications or change in dosage (2)  I certify that inpatient services furnished can reasonably be expected to improve the patient's condition.   Zekiah Coen,JANARDHAHA R. 09/18/2013, 12:39 PM

## 2013-09-18 NOTE — [facility] Group Notes (Signed)
[facility] LCSW Group Therapy      Feelings About Diagnosis 1:15 - 2:30 PM         09/18/2013  3:10 PM    Type of Therapy:  Group Therapy  Participation Level:  Minimal  Participation Quality:  Appropriate  Affect:  Appropriate  Cognitive:  Alert and Appropriate  Insight:  Developing/Improving and Engaged  Engagement in Therapy:  Developing/Improving and Engaged  Modes of Intervention:  Discussion, Education, Exploration, Problem-Solving, Rapport Building, Support  Summary of Progress/Problems:  Patient listened attentively but did not add to discussion.  Wynn BankerHodnett, [name] 09/18/2013  3:10 PM

## 2013-09-18 NOTE — Progress Notes (Signed)
The focus of this group is to educate the patient on the purpose and policies of crisis stabilization and provide a format to answer questions about their admission.  The group details unit policies and expectations of patients while admitted.  Patient did not attend 0900 nurse education orientation this morning.  Patient stayed in bed during group.

## 2013-09-19 MED ORDER — QUETIAPINE FUMARATE 300 MG PO TABS: 300.0000 mg | ORAL_TABLET | Freq: Every day | ORAL | Status: DC

## 2013-09-19 MED ORDER — DULOXETINE HCL 60 MG PO CPEP: 60.0000 mg | ORAL_CAPSULE | Freq: Every day | ORAL | Status: DC

## 2013-09-19 MED ORDER — CLONAZEPAM 1 MG PO TABS: 1.0000 mg | ORAL_TABLET | Freq: Two times a day (BID) | ORAL | Status: DC | PRN

## 2013-09-19 MED ORDER — DOXEPIN HCL 50 MG PO CAPS: 50.0000 mg | ORAL_CAPSULE | Freq: Every evening | ORAL | Status: DC | PRN

## 2013-09-19 MED ORDER — IBUPROFEN 400 MG PO TABS
400.0000 mg | ORAL_TABLET | Freq: Once | ORAL | Status: AC
  Administered 2013-09-19: 400 mg via ORAL
  Filled 2013-09-19 (×2): qty 1

## 2013-09-19 MED ORDER — ZIPRASIDONE HCL 60 MG PO CAPS: 60.0000 mg | ORAL_CAPSULE | Freq: Two times a day (BID) | ORAL | Status: DC

## 2013-09-19 NOTE — Progress Notes (Signed)
Patient ID: [name], female   DOB: 09/10/1985, 28 y.o.   MRN: [id number] Patient discharged per physician order; patient denies SI/HI and A/V hallucinations; patient received samples, prescriptions, and copy of AVS after it was reviewed; patient had no other questions or concerns at this time; patient verbalized and signed that she received all belongings; patient left the unit ambulatory
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
## 2013-09-19 NOTE — Tx Team (Signed)
Interdisciplinary Treatment Plan Update   Date Reviewed:  09/19/2013  Time Reviewed:  8:35 AM  Progress in Treatment:   Attending groups: Yes Participating in groups: Yes Taking medication as prescribed: Yes  Tolerating medication: Yes Family/Significant other contact made: No, patient declined collateral contact. Patient understands diagnosis: Yes  Discussing patient identified problems/goals with staff: Yes Medical problems stabilized or resolved: Yes Denies suicidal/homicidal ideation: Yes Patient has not harmed self or others: Yes  For review of initial/current patient goals, please see plan of care.  Estimated Length of Stay:  Discharge today  Reasons for Continued Hospitalization:    New Problems/Goals identified:    Discharge Plan or Barriers:   Home with outpatient follow up with [facility].   Attendees:  Patient:   Diane ChardChristian Rokosz 09/19/2013 8:35 AM   Signature: Mervyn GayJ. Jonnalagadda, MD 09/19/2013 8:35 AM  Signature:   09/19/2013 8:35 AM  Signature:   09/19/2013 8:35 AM  Signature: 09/19/2013 8:35 AM  Signature:   09/19/2013 8:35 AM  Signature:  Juline PatchQuylle Gerlean Cid, LCSW 09/19/2013 8:35 AM  Signature:  Reyes Ivanhelsea Horton, LCSW 09/19/2013 8:35 AM  Signature:  Leisa LenzValerie Enoch, Care Coordinator - [facility] 09/19/2013 8:35 AM  Signature:  Tomasita Morrowelora Sutton, Care Coordinator  -[facility] 09/19/2013 8:35 AM  Signature: 09/19/2013  8:35 AM  Signature:   Onnie BoerJennifer Clark, RN [facility] 09/19/2013  8:35 AM  Signature:   09/19/2013  8:35 AM    Scribe for Treatment Team:   Juline PatchQuylle Quillan Whitter,  09/19/2013 8:35 AM

## 2013-09-19 NOTE — [facility] Group Notes (Signed)
[facility] L[facility] Discharge Planning Group Note   09/19/2013 9:48 AM    Participation Quality:  Appropraite  Mood/Affect:  Appropriate  Depression Rating:  2  Anxiety Rating:  2  Thoughts of Suicide:  No  Will you contract for safety?   NA  Current AVH:  No  Plan for Discharge/Comments:  Patient attended discharge planning group and actively participated in group.   She advised of being okay for discharge home today.  [facility]  CSW provided all participants with daily workbook.   Transportation Means: Patient has transportation.   Supports:  Patient has a support system.   [name], [name]

## 2013-09-19 NOTE — Progress Notes (Signed)
[facility] :  Will you be returning to the same living situation after discharge: Yes,  Patient to return to friend's home. At discharge, do you have transportation home?:Yes,  Patient to arrange transportation. Do you have the ability to pay for your medications:No.  Patient will need assistance with indigent medications.  Release of information consent forms completed and in the chart;  Patient's signature needed at discharge.  Patient to Follow up at: Follow-up Information   Follow up with Ms. Leandro ReasonerCarrie Coleman - [facility] On 09/20/2013. (Thursday, September 20, 2013 at 1:00 PM)    Contact information:   [address] [place], [place]    [id number]  [phone]      Patient denies SI/HI:   Patient no longer endorsing SI/HI or other thoughts of self harm.     Safety Planning and Suicide Prevention discussed:  .Reviewed with all patients during discharge planning group   [name], [name] 09/19/2013, 10:54 AM

## 2013-09-19 NOTE — [facility] Suicide Risk Assessment (Signed)
Suicide Risk Assessment  Discharge Assessment     Demographic Factors:  Adolescent or young adult, Caucasian and Low socioeconomic status  Mental Status Per Nursing Assessment::   On Admission:  Suicidal ideation indicated by others  Current Mental Status by Physician: Patient was calm and cooperative. Patient has no abnormal psychomotor activity patient has normal rate rhythm and volume of speech. Patient has no suicidal, homicidal ideation, intention or plans. Patient has fair insight judgment and impulse control.  Loss Factors: Financial problems/change in socioeconomic status  Historical Factors: Impulsivity  Risk Reduction Factors:   NA  Continued Clinical Symptoms:  Bipolar Disorder:   Depressive phase Previous Psychiatric Diagnoses and Treatments Medical Diagnoses and Treatments/Surgeries  Cognitive Features That Contribute To Risk:  Polarized thinking    Suicide Risk:  Minimal: No identifiable suicidal ideation.  Patients presenting with no risk factors but with morbid ruminations; may be classified as minimal risk based on the severity of the depressive symptoms  Discharge Diagnoses:   AXIS I:  Bipolar, Depressed AXIS II:  Deferred AXIS III:   Past Medical History  Diagnosis Date  . Asthma   . Bipolar 1 disorder   . Anxiety   . Endometriosis   . Mental disorder    AXIS IV:  other psychosocial or environmental problems, problems related to social environment and problems with primary support group AXIS V:  61-70 mild symptoms  Plan Of Care/Follow-up recommendations:  Activity:  As tolerated Diet:  Regular  Is patient on multiple antipsychotic therapies at discharge:  No   Has Patient had three or more failed trials of antipsychotic monotherapy by history:  No  Recommended Plan for Multiple Antipsychotic Therapies: NA  [name],Diane R. 09/19/2013, 1:46 PM

## 2013-09-19 NOTE — Progress Notes (Signed)
Pt has been observed in the dayroom watching TV and playing cards with her peers.  She states she is feeling better, but does not feel she is ready to be discharged.  She says she is supposed to discharge Wednesday to home.  She says she is not ready to deal with the process of trying to get her children back .  Pt denies SI/HI/AV at this time.  Pt forwards little with staff and conversation was minimal.  Support and encouragement offered.  Safety maintained with q15 minute checks.

## 2013-09-24 NOTE — Progress Notes (Signed)
Patient Discharge Instructions:  After Visit Summary (AVS):   Faxed to:  09/24/13 Discharge Summary Note:   Faxed to:  09/24/13 Psychiatric Admission Assessment Note:   Faxed to:  09/24/13 Suicide Risk Assessment - Discharge Assessment:   Faxed to:  09/24/13 Faxed/Sent to the Next Level Care provider:  09/24/13 [facility]axed to [facility] @ [phone]  Jerelene ReddenSheena E [place], 09/24/2013, 3:57 PM

## 2014-03-10 ENCOUNTER — Encounter (HOSPITAL_COMMUNITY): Payer: Self-pay | Admitting: Emergency Medicine

## 2014-03-10 ENCOUNTER — Emergency Department (HOSPITAL_COMMUNITY)
Admission: EM | Admit: 2014-03-10 | Discharge: 2014-03-11 | Disposition: A | Discharge status: Home / Self Care | Payer: Medicaid Other | Attending: Emergency Medicine | Admitting: Emergency Medicine

## 2014-03-10 DIAGNOSIS — F17200 Nicotine dependence, unspecified, uncomplicated: Secondary | ICD-10-CM | POA: Insufficient documentation

## 2014-03-10 DIAGNOSIS — J45909 Unspecified asthma, uncomplicated: Secondary | ICD-10-CM | POA: Insufficient documentation

## 2014-03-10 DIAGNOSIS — F319 Bipolar disorder, unspecified: Secondary | ICD-10-CM | POA: Diagnosis not present

## 2014-03-10 DIAGNOSIS — R45851 Suicidal ideations: Secondary | ICD-10-CM | POA: Insufficient documentation

## 2014-03-10 DIAGNOSIS — Z8742 Personal history of other diseases of the female genital tract: Secondary | ICD-10-CM | POA: Diagnosis not present

## 2014-03-10 DIAGNOSIS — Z79899 Other long term (current) drug therapy: Secondary | ICD-10-CM | POA: Diagnosis not present

## 2014-03-10 HISTORY — DX: Depression, unspecified: F32.A

## 2014-03-10 HISTORY — DX: Major depressive disorder, single episode, unspecified: F32.9

## 2014-03-10 LAB — COMPREHENSIVE METABOLIC PANEL
ALK PHOS: 113 U/L (ref 39–117)
ALT: 18 U/L (ref 0–35)
AST: 22 U/L (ref 0–37)
Albumin: 4.3 g/dL (ref 3.5–5.2)
Anion gap: 16 — ABNORMAL HIGH (ref 5–15)
BILIRUBIN TOTAL: 0.5 mg/dL (ref 0.3–1.2)
BUN: 10 mg/dL (ref 6–23)
CHLORIDE: 99 meq/L (ref 96–112)
CO2: 24 mEq/L (ref 19–32)
Calcium: 9.7 mg/dL (ref 8.4–10.5)
Creatinine, Ser: 0.73 mg/dL (ref 0.50–1.10)
GFR calc Af Amer: >90 mL/min (ref >90)
GFR calc non Af Amer: >90 mL/min (ref >90)
Glucose, Bld: 93 mg/dL (ref 70–99)
POTASSIUM: 4.1 meq/L (ref 3.7–5.3)
SODIUM: 139 meq/L (ref 137–147)
TOTAL PROTEIN: 8.1 g/dL (ref 6.0–8.3)

## 2014-03-10 LAB — RAPID URINE DRUG SCREEN, HOSP PERFORMED
Amphetamines: NOT DETECTED
BARBITURATES: NOT DETECTED
Benzodiazepines: POSITIVE — AB
Cocaine: NOT DETECTED
Opiates: NOT DETECTED
Tetrahydrocannabinol: NOT DETECTED

## 2014-03-10 LAB — CBC
HEMATOCRIT: 41.9 % (ref 36.0–46.0)
Hemoglobin: 14.5 g/dL (ref 12.0–15.0)
MCH: 31.6 pg (ref 26.0–34.0)
MCHC: 34.6 g/dL (ref 30.0–36.0)
MCV: 91.3 fL (ref 78.0–100.0)
PLATELETS: 200 10*3/uL (ref 150–400)
RBC: 4.59 MIL/uL (ref 3.87–5.11)
RDW: 12.9 % (ref 11.5–15.5)
WBC: 5.8 10*3/uL (ref 4.0–10.5)

## 2014-03-10 LAB — POC URINE PREG, ED: Preg Test, Ur: NEGATIVE

## 2014-03-10 LAB — SALICYLATE LEVEL: Salicylate Lvl: <2 mg/dL — ABNORMAL LOW (ref 2.8–20.0)

## 2014-03-10 LAB — ACETAMINOPHEN LEVEL

## 2014-03-10 LAB — ETHANOL: Alcohol, Ethyl (B): <11 mg/dL (ref 0–11)

## 2014-03-10 NOTE — BH Assessment (Signed)
Attempted tele-assessment but tele-cart would not connect while in triage 3, presumably due to poor Wifi signal. Gilberto BetterLaQuesta Sims, TTS [facility] at [employer] [employer] [facility], will assess Pt in person.  Harlin RainFord Ellis Ria CommentWarrick Jr, LPC, [facility] Triage Specialist [phone]

## 2014-03-10 NOTE — BH Assessment (Signed)
Received call for assessment. Spoke to [employer]ren Parker, PA-C who said Pt has a history of depression and reports suicidal ideation due to conflicts with her husband. Tele-assessment will be initiated.  Harlin RainFord Ellis Ria CommentWarrick Jr, LPC, [facility] Triage Specialist [phone]
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
## 2014-03-10 NOTE — BH Assessment (Signed)
Assessment completed. Consulted Alberteen SamFran Hobson, NP who recommended that [facility]. Pt has been accepted to OBS Bed 5- Dr. Lucianne MussKumar. Mellody DrownLauren Parker, PA-C has been notified of the recommendation.

## 2014-03-10 NOTE — ED Provider Notes (Signed)
CSN: [id number]     Arrival date & time 03/10/14  2032 History  This chart was scribed for Diane Drown, PA-C, working with Doug Sou, MD by Leona Carry, ED Scribe. The patient was seen in [facility]. The patient's care was started at 10:30 PM.     Chief Complaint  Patient presents with  . Suicidal   HPI Comments: [name] is a 28 y.o. female [facility] complaining of suicidal ideations beginning today. Patient states that her plan is to cut her wrists. She has previously attempted to commit suicide by cutting her wrists. Patient reports that her suicidal ideations today were triggered when son's father took her son away from her today. She denies use of drugs or alcohol today. She reports that she last drank alcohol 5 days ago and last smoked marijuana one week ago. Patient denies visual changes or hallucinations. Patient goes to [facility] for behavioral health, reports compliance with BH medication. Patient is currently taking Seroquel 300 mg at night. She takes Klonopin 1 mg 4 times/day, Latuda 40mg .   The history is provided by the patient. No language interpreter was used.    Past Medical History  Diagnosis Date  . Asthma   . Bipolar 1 disorder   . Anxiety   . Endometriosis   . Mental disorder   . Depression    Past Surgical History  Procedure Laterality Date  . Cholecystectomy    . Abdominal surgery    . Tubal ligation     No family history on file. History  Substance Use Topics  . Smoking status: Current Every Day Smoker -- 0.25 packs/day for 13 years    Types: Cigarettes  . Smokeless tobacco: Not on file  . Alcohol Use: Yes     Comment: Drinks twice a month   OB History   Grav Para Term Preterm Abortions TAB SAB Ect Mult Living                 Review of Systems  Constitutional: Negative for fever and chills.  Eyes: Negative for visual disturbance.  Gastrointestinal: Negative for abdominal  pain.  Psychiatric/Behavioral: Positive for suicidal ideas. Negative for hallucinations and self-injury.  All other systems reviewed and are negative.     Allergies  Review of patient's allergies indicates no known allergies.  Home Medications   Prior to Admission medications   Medication Sig Start Date End Date Taking? Authorizing Provider  cholecalciferol (VITAMIN D) 1000 UNITS tablet Take 1,000 Units by mouth daily.   Yes Historical Provider, MD  clonazePAM (KLONOPIN) 1 MG tablet Take 1 tablet (1 mg total) by mouth 2 (two) times daily as needed (anxiety). 09/19/13  Yes Beau Fanny, FNP  lurasidone (LATUDA) 40 MG TABS tablet Take 40 mg by mouth daily with breakfast.   Yes Historical Provider, MD   Triage Vitals: BP 120/86  Pulse 72  Temp(Src) 98.3 F (36.8 C) (Oral)  Resp 18  Ht 5\' 1"  (1.549 m)  Wt 194 lb (87.998 kg)  BMI 36.67 kg/m2  SpO2 98%  LMP 02/06/2014 Physical Exam  Nursing note and vitals reviewed. Constitutional: She is oriented to person, place, and time. She appears well-developed and well-nourished.  Non-toxic appearance. She does not have a sickly appearance. She does not appear ill. No distress.  HENT:  Head: Normocephalic and atraumatic.  Eyes: EOM are normal. Pupils are equal, round, and reactive to light. Right eye exhibits no discharge. Left eye exhibits no  discharge. No scleral icterus.  Neck: Normal range of motion. Neck supple.  Cardiovascular: Normal rate and regular rhythm.   No murmur heard. Pulmonary/Chest: Effort normal and breath sounds normal. No respiratory distress. She has no wheezes. She has no rales. She exhibits no tenderness.  Abdominal: Soft. Bowel sounds are normal. She exhibits no distension. There is no tenderness. There is no rebound and no guarding.  Musculoskeletal: Normal range of motion. She exhibits no edema.  Neurological: She is alert and oriented to person, place, and time. No cranial nerve deficit or sensory deficit.   Skin: Skin is warm and dry. No rash noted. She is not diaphoretic.  Multiple white objects in hair.  Psychiatric: She has a normal mood and affect. Her behavior is normal. She expresses suicidal ideation. She expresses no homicidal ideation. She expresses suicidal plans. She expresses no homicidal plans.  Pt is tearfull    ED Course  Procedures (including critical care time)  10:36 PM - Discussed treatment plan with pt at bedside.    Labs Review Results for orders placed during the hospital encounter of 03/10/14  ACETAMINOPHEN LEVEL      Result Value Ref Range   Acetaminophen (Tylenol), Serum <15.0  10 - 30 ug/mL  CBC      Result Value Ref Range   WBC 5.8  4.0 - 10.5 K/uL   RBC 4.59  3.87 - 5.11 MIL/uL   Hemoglobin 14.5  12.0 - 15.0 g/dL   HCT 09.841.9  11.936.0 - 14.746.0 %   MCV 91.3  78.0 - 100.0 fL   MCH 31.6  26.0 - 34.0 pg   MCHC 34.6  30.0 - 36.0 g/dL   RDW 82.912.9  56.211.5 - 13.015.5 %   Platelets 200  150 - 400 K/uL  COMPREHENSIVE METABOLIC PANEL      Result Value Ref Range   Sodium 139  137 - 147 mEq/L   Potassium 4.1  3.7 - 5.3 mEq/L   Chloride 99  96 - 112 mEq/L   CO2 24  19 - 32 mEq/L   Glucose, Bld 93  70 - 99 mg/dL   BUN 10  6 - 23 mg/dL   Creatinine, Ser 8.650.73  0.50 - 1.10 mg/dL   Calcium 9.7  8.4 - 78.410.5 mg/dL   Total Protein 8.1  6.0 - 8.3 g/dL   Albumin 4.3  3.5 - 5.2 g/dL   AST 22  0 - 37 U/L   ALT 18  0 - 35 U/L   Alkaline Phosphatase 113  39 - 117 U/L   Total Bilirubin 0.5  0.3 - 1.2 mg/dL   GFR calc non Af Amer >90  >90 mL/min   GFR calc Af Amer >90  >90 mL/min   Anion gap 16 (*) 5 - 15  ETHANOL      Result Value Ref Range   Alcohol, Ethyl (B) <11  0 - 11 mg/dL  SALICYLATE LEVEL      Result Value Ref Range   Salicylate Lvl <2.0 (*) 2.8 - 20.0 mg/dL  URINE RAPID DRUG SCREEN (HOSP PERFORMED)      Result Value Ref Range   Opiates NONE DETECTED  NONE DETECTED   Cocaine NONE DETECTED  NONE DETECTED   Benzodiazepines POSITIVE (*) NONE DETECTED   Amphetamines NONE  DETECTED  NONE DETECTED   Tetrahydrocannabinol NONE DETECTED  NONE DETECTED   Barbiturates NONE DETECTED  NONE DETECTED  POC URINE PREG, ED      Result Value Ref  Range   Preg Test, Ur NEGATIVE  NEGATIVE   No results found.   MDM   Final diagnoses:  Suicide ideation   Patient brought in by mobile crisis due to SI, specific plan includes cutting wrists. Reports compliance medication. Consult TTS placed. Questionable lice in hair. Patient excepted at outside facility for further evaluation of her depression and SI. Pt also assessed by Dr. Read Drivers, who advises likely dandruff, no lice identified on exam.  Meds given in ED:  Medications - No data to display  Discharge Medication List as of 03/11/2014 12:03 AM    START taking these medications   Details  permethrin (PERMETHRIN LICE TREATMENT) 1 % lotion Apply 1 application topically once. Shampoo, rinse and towel dry hair, saturate hair and scalp with permethrin. Rinse after 10 min; repeat in 1 week if needed, Starting 03/11/2014, Print        This chart was scribed in my presence and reviewed by me personally.   Clabe Seal, PA-C 03/11/14 [phone]

## 2014-03-11 ENCOUNTER — Encounter (HOSPITAL_COMMUNITY): Payer: Self-pay | Admitting: *Deleted

## 2014-03-11 ENCOUNTER — Inpatient Hospital Stay (HOSPITAL_COMMUNITY)
Admission: AD | Admit: 2014-03-11 | Discharge: 2014-03-18 | DRG: 885 | Disposition: A | Discharge status: Home / Self Care | Payer: Medicaid Other | Source: Intra-hospital | Attending: Psychiatry | Admitting: Psychiatry

## 2014-03-11 DIAGNOSIS — F319 Bipolar disorder, unspecified: Secondary | ICD-10-CM | POA: Diagnosis present

## 2014-03-11 DIAGNOSIS — F332 Major depressive disorder, recurrent severe without psychotic features: Secondary | ICD-10-CM | POA: Diagnosis present

## 2014-03-11 DIAGNOSIS — F1210 Cannabis abuse, uncomplicated: Secondary | ICD-10-CM | POA: Diagnosis present

## 2014-03-11 DIAGNOSIS — J45909 Unspecified asthma, uncomplicated: Secondary | ICD-10-CM | POA: Diagnosis present

## 2014-03-11 DIAGNOSIS — G4700 Insomnia, unspecified: Secondary | ICD-10-CM | POA: Diagnosis present

## 2014-03-11 DIAGNOSIS — IMO0002 Reserved for concepts with insufficient information to code with codable children: Secondary | ICD-10-CM | POA: Diagnosis not present

## 2014-03-11 DIAGNOSIS — F17200 Nicotine dependence, unspecified, uncomplicated: Secondary | ICD-10-CM | POA: Diagnosis present

## 2014-03-11 DIAGNOSIS — F329 Major depressive disorder, single episode, unspecified: Secondary | ICD-10-CM | POA: Diagnosis present

## 2014-03-11 DIAGNOSIS — F314 Bipolar disorder, current episode depressed, severe, without psychotic features: Secondary | ICD-10-CM

## 2014-03-11 DIAGNOSIS — F411 Generalized anxiety disorder: Secondary | ICD-10-CM | POA: Diagnosis present

## 2014-03-11 DIAGNOSIS — R45851 Suicidal ideations: Secondary | ICD-10-CM

## 2014-03-11 MED ORDER — PERMETHRIN 1 % EX LOTN: 1.0000 "application " | TOPICAL_LOTION | Freq: Once | CUTANEOUS | Status: DC

## 2014-03-11 MED ORDER — HYDROXYZINE HCL 25 MG PO TABS
25.0000 mg | ORAL_TABLET | Freq: Four times a day (QID) | ORAL | Status: DC | PRN
Start: 2014-03-11 — End: 2014-03-18
  Administered 2014-03-11 – 2014-03-16 (×5): 25 mg via ORAL
  Filled 2014-03-11 (×5): qty 1

## 2014-03-11 MED ORDER — LURASIDONE HCL 40 MG PO TABS
40.0000 mg | ORAL_TABLET | Freq: Every day | ORAL | Status: DC
  Administered 2014-03-11 – 2014-03-12 (×2): 40 mg via ORAL
  Filled 2014-03-11 (×6): qty 1

## 2014-03-11 MED ORDER — ALUM & MAG HYDROXIDE-SIMETH 200-200-20 MG/5ML PO SUSP: 30.0000 mL | ORAL | Status: DC | PRN

## 2014-03-11 MED ORDER — ACETAMINOPHEN 325 MG PO TABS
650.0000 mg | ORAL_TABLET | Freq: Four times a day (QID) | ORAL | Status: DC | PRN
  Administered 2014-03-11 – 2014-03-17 (×2): 650 mg via ORAL
  Filled 2014-03-11 (×2): qty 2

## 2014-03-11 MED ORDER — CLONAZEPAM 1 MG PO TABS
1.0000 mg | ORAL_TABLET | Freq: Two times a day (BID) | ORAL | Status: DC | PRN
  Administered 2014-03-11 – 2014-03-16 (×10): 1 mg via ORAL
  Filled 2014-03-11 (×10): qty 1

## 2014-03-11 MED ORDER — MAGNESIUM HYDROXIDE 400 MG/5ML PO SUSP
30.0000 mL | Freq: Every day | ORAL | Status: DC | PRN
Start: 2014-03-11 — End: 2014-03-18

## 2014-03-11 NOTE — Progress Notes (Signed)
[facility] INPATIENT:  Family/Significant Other Suicide Prevention Education  Suicide Prevention Education:  Patient Refusal for Family/Significant Other Suicide Prevention Education: The patient [name] has refused to provide written consent for family/significant other to be provided Family/Significant Other Suicide Prevention Education during admission and/or prior to discharge.  Physician notified.  Glenice LaineIbekwe, Ciani Rutten B 03/11/2014, 3:15 AM

## 2014-03-11 NOTE — Plan of Care (Signed)
[facility] Observation Crisis Plan  Reason for Crisis Plan:  Crisis Stabilization   Plan of Care:  Referral for Inpatient Hospitalization  Family Support:    none  Current Living Environment:  Living Arrangements: Spouse/significant other  Insurance:   Hospital Account   Name Acct ID Class Status Primary Coverage   [name] [id number] BEHAVIORAL HEALTH OBSERVATION [place]        Guarantor Account (for Hospital Account [id number])   Name Relation to Pt Service Area Active? Acct Type   [name] Self [facility] Yes Behavioral Health   Address Phone       [address] [facility] [place], [place] [address] [phone])          Coverage Information (for Hospital Account [id number])   F/O Payor/Plan Precert #   [facility]    Subscriber Subscriber #   [name] [id number]   Address Phone   [address] [place], [place] [address] [phone]      Legal Guardian:   self  [facility]  Current Outpatient Providers:  none  Psychiatrist:   none  Counselor/Therapist:   none  Compliant with Medications:  Yes  Additional Information:   [name] 7/13/20153:09 AM

## 2014-03-11 NOTE — Progress Notes (Signed)
Patient remains suicidal with a plan to cut her wrists if discharged. Patient's mood is depressed and affect flat. Patient sleeping throughout day. Patient resistant to showering while in the OBS unit.

## 2014-03-11 NOTE — Progress Notes (Signed)
Adult Psychoeducational Group Note  Date:  03/11/2014 Time:  8:30pm Group Topic/Focus:  Wrap-Up Group:   The focus of this group is to help patients review their daily goal of treatment and discuss progress on daily workbooks.  Participation Level:  Active  Participation Quality:  Appropriate and Attentive  Affect:  Appropriate  Cognitive:  Alert and Appropriate  Insight: Appropriate  Engagement in Group:  Engaged  Modes of Intervention:  Discussion  Additional Comments:  Pt. Was attentive and appropriate during today's group discussion. Pt stated that today was a stressful day. Pt shared that she is willing to journal her feelings, so that she want keep things bothered up.   Bing PlumeScott, Verlaine Embry D 03/11/2014, 10:34 PM
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
## 2014-03-11 NOTE — Discharge Instructions (Signed)
You have met requirement for inpatient care. Call for a follow up appointment with a Family or Primary Care Provider.  Return if Symptoms worsen.   Take medication as prescribed.

## 2014-03-11 NOTE — Discharge Summary (Signed)
Patient continues to voice suicidal ideation, needs inpatient admission. Patient was seen, evaluated by me, treatment plan formulated by me

## 2014-03-11 NOTE — H&P (Signed)
Patient seen, evaluated by me. Discuss with patient the need for her to work on her coping skills, contact DSS say she can get custody of her son.
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
## 2014-03-11 NOTE — Plan of Care (Signed)
[facility] Observation Crisis Plan  Reason for Crisis Plan:  Crisis Stabilization   Plan of Care:  Referral for Inpatient Hospitalization  Family Support:    Pt reports that she has no social supports.  Current Living Environment:  Living Arrangements: Spouse/significant other; Pt is currently living with her boyfriend's father.  The boyfriend moved out of the household yesterday, 03/10/2014.  Insurance:  [facility] [facility] [name] Account   Name Acct ID Class Status Primary Coverage   Diane MartyrDishon, [name] [id number] [facility] [place]        Guarantor Account (for [name] Account [id number])   Name Relation to Pt Service Area Active? Acct Type   [name], [name] Self [facility] Yes Behavioral Health   Address Phone       [address] [facility] [place], [place] [address] [phone])          Coverage Information (for [name] Account [id number])   F/O Payor/Plan Precert #   [facility] [place]    Subscriber Subscriber #   Diane MartyrDishon, [name] [id number] S   Address Phone   [address] [place], [place] [address] [phone]      Legal Guardian:   Self  [name]; pt [facility]  Current Outpatient Providers:  [facility]  Psychiatrist:    [facility]  Counselor/Therapist:    [facility]  Compliant with Medications:  Yes  Additional Information: After consulting with Diane Headonrad Withrow, NP it has been determined that pt presents a danger to herself, for which psychiatric hospitalization is indicated.  [name] to the service of Diane MassedFernando Cobos, MD, Rm [id number].  Pt signed Voluntary Admission and Consent for Treatment.  She also signed Consent for Release of Information to [facility], her current outpatient provider.    Diane Canninghomas Faithlynn Deeley, MA Triage Specialist Diane GibneyHughes, [name]  [name] 7/13/20154:22 PM
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
## 2014-03-12 MED ORDER — VENLAFAXINE HCL ER 37.5 MG PO CP24
37.5000 mg | ORAL_CAPSULE | Freq: Every day | ORAL | Status: DC
  Administered 2014-03-12 – 2014-03-13 (×2): 37.5 mg via ORAL
  Filled 2014-03-12 (×5): qty 1

## 2014-03-12 MED ORDER — QUETIAPINE FUMARATE 200 MG PO TABS
200.0000 mg | ORAL_TABLET | Freq: Every day | ORAL | Status: DC
  Filled 2014-03-12 (×2): qty 1

## 2014-03-12 MED ORDER — DICYCLOMINE HCL 10 MG PO CAPS
10.0000 mg | ORAL_CAPSULE | Freq: Three times a day (TID) | ORAL | Status: DC
  Administered 2014-03-12 – 2014-03-18 (×22): 10 mg via ORAL
  Filled 2014-03-12 (×32): qty 1

## 2014-03-12 MED ORDER — TRAZODONE HCL 50 MG PO TABS
50.0000 mg | ORAL_TABLET | Freq: Every evening | ORAL | Status: DC | PRN
  Administered 2014-03-12 – 2014-03-13 (×2): 50 mg via ORAL
  Filled 2014-03-12 (×9): qty 1

## 2014-03-12 MED ORDER — LURASIDONE HCL 40 MG PO TABS
20.0000 mg | ORAL_TABLET | Freq: Every day | ORAL | Status: DC
  Administered 2014-03-12 – 2014-03-14 (×3): 20 mg via ORAL
  Filled 2014-03-12 (×6): qty 1
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
## 2014-03-12 NOTE — Progress Notes (Signed)
Recreation Therapy Notes  Animal-Assisted Activity/Therapy (AAA/T) Program Checklist/Progress Notes Patient Eligibility Criteria Checklist & Daily Group note for Rec Tx Intervention  Date: 07.14.2015 Time: 3:15pm [address]   AAA/T Program Assumption of Risk Form signed by Patient/ or Parent Legal Guardian yes  Patient is free of allergies or sever asthma yes  Patient reports no fear of animals yes  Patient reports no history of cruelty to animals yes   Patient understands his/her participation is voluntary yes  Patient washes hands before animal contact yes  Patient washes hands after animal contact yes  Behavioral Response: Engaged, Appropriate   Education: Hand Washing, Appropriate Animal Interaction   Education Outcome: Acknowledges understanding   Clinical Observations/Feedback: Patient engaged appropriately with therapy dog and peers in session.  Marykay Lexenise L Chanise Habeck, LRT/CTRS  Megham Dwyer L 03/12/2014 5:08 PM
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
## 2014-03-12 NOTE — [facility] Suicide Risk Assessment (Signed)
[facility] INPATIENT:  Family/Significant Other Suicide Prevention Education  Suicide Prevention Education:   Patient Refusal for Family/Significant Other Suicide Prevention Education: The patient [name] has refused to provide written consent for family/significant other to be provided Family/Significant Other Suicide Prevention Education during admission and/or prior to discharge.  Physician notified.  Wynn BankerHodnett, Mart Colpitts Hairston 03/12/2014, 3:26 PM

## 2014-03-12 NOTE — Progress Notes (Signed)
NUTRITION ASSESSMENT  Pt identified as at risk on the Malnutrition Screen Tool  INTERVENTION: 1. Educated patient on the importance of nutrition and encouraged intake of food and beverages. 2. Discussed weight goals. 3. Supplements: none at this time.  NUTRITION DIAGNOSIS: Predicted sub optimal intake related to depression AEB patient report.  Goal: Pt to meet >/= 90% of their estimated nutrition needs.  Monitor:  PO intake  Assessment:  Patient admitted with SI, bipolar.  Decreased intake for the past month.  Vomited this am without known cause.  UBW unknown per patient.  Thinks that she has lost weight.  Per e-chart, patient has gained 9 lbs in the past 6 months.  28 y.o. female  Height: Ht Readings from Last 1 Encounters:  03/11/14 5\' 2"  (1.575 m)    Weight: Wt Readings from Last 1 Encounters:  03/11/14 199 lb (90.266 kg)    Weight Hx: Wt Readings from Last 10 Encounters:  03/11/14 199 lb (90.266 kg)  03/10/14 194 lb (87.998 kg)  09/14/13 190 lb (86.183 kg)  12/30/11 155 lb 8 oz (70.534 kg)  12/29/11 156 lb (70.761 kg)    BMI:  Body mass index is 36.39 kg/(m^2). Pt meets criteria for obesity grade 2 based on current BMI.  Estimated Nutritional Needs: Kcal: 25-30 kcal/kg Protein: > 1 gram protein/kg Fluid: 1 ml/kcal  Diet Order: General Pt is also offered choice of unit snacks mid-morning and mid-afternoon.  Pt is eating as desired.   Lab results and medications reviewed.   Oran ReinLaura Casten Floren, RD, LDN Clinical Inpatient Dietitian Pager:  [phone] Weekend and after hours pager:  [phone]
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
## 2014-03-12 NOTE — Progress Notes (Signed)
D:  Patient's self inventory sheet, patient has poor sleep, improving appetite, low energy level, improving attention span.  Rated depression, anxiety and hopeless 7.  Denied withdrawals.  SI, contracts for safety, no plan.  Has experienced abdominal pain in past 24 hours, worst pain 6, 1 pain goal.  Plans to get child back to be happy again.  Feels sick, in bed.  No discharge plans.  No problems taking meds after discharge. A:  Medications administered per MD orders.  Emotional support and encouragement given patient. R:  Denied HI.  Denied A/V hallucinations.  SI, contracts for safety, no plan.  Safety maintained with 15 minute checks.

## 2014-03-12 NOTE — Progress Notes (Signed)
The focus of this group is to educate the patient on the purpose and policies of crisis stabilization and provide a format to answer questions about their admission.  The group details unit policies and expectations of patients while admitted.  Patient attended nurse education orientation group this morning.  Patient actively participated, appropriate affect, alert, appropriate insight and engagement.  Today patient will work on 3 goals for discharge.  

## 2014-03-12 NOTE — Progress Notes (Signed)
Adult Psychoeducational Group Note  Date:  03/12/2014 Time:  10:22 PM  Group Topic/Focus:  Wrap-Up Group:   The focus of this group is to help patients review their daily goal of treatment and discuss progress on daily workbooks.  Participation Level:  Active  Participation Quality:  Appropriate and Attentive  Affect:  Appropriate  Cognitive:  Alert and Appropriate  Insight: Appropriate and Good  Engagement in Group:  Engaged  Modes of Intervention:  Activity  Additional Comments:  Pt was in group and was engaged in the group discussion   [name] 03/12/2014, 10:22 PM

## 2014-03-13 LAB — TSH: TSH: 2.84 u[IU]/mL (ref 0.350–4.500)

## 2014-03-13 MED ORDER — VENLAFAXINE HCL ER 75 MG PO CP24
75.0000 mg | ORAL_CAPSULE | Freq: Every day | ORAL | Status: DC
  Administered 2014-03-14 – 2014-03-18 (×5): 75 mg via ORAL
  Filled 2014-03-13 (×7): qty 1

## 2014-03-13 NOTE — Progress Notes (Signed)
D: Patient has depressed affect and mood. She reported on the self inventory sheet that sleep and appetite are both fair, energy level is normal and concentration today is good. Patient rates depression and anxiety "6" and feelings of hopelessness "3". She's participating in group sessions. Patient is compliant with medication regimen.  A: Support and encouragement provided to patient. Scheduled medications administered per MD orders. Maintain Q15 minute checks for safety.  R: Patient receptive. Denies SI/HI. Patient remains safe.

## 2014-03-13 NOTE — Progress Notes (Signed)
Patient ID: [name], female   DOB: 10/27/1985, 28 y.o.   MRN: [id number] D: Client visible on unit in dayroom watching TV and interacting. She rated depression at "6" of 10 and anxiety at "5" of 10. Pt. Says she learned "you got to take care of yourself first before you can take care of anyone else." Client heard from DSS today and she will be able to have supervised visits with her son. A: Writer encouraged client to keep looking forward and continue to gain coping skills from group. Staff will monitor q1015min for safety. R: Pt. Is safe on unit, attended group.

## 2014-03-13 NOTE — ED Provider Notes (Signed)
Medical screening examination/treatment/procedure(s) were performed by non-physician practitioner and as supervising physician I was immediately available for consultation/collaboration.   EKG Interpretation None        Hanley SeamenJohn L Adaja Wander, MD 03/13/14 2234

## 2014-03-13 NOTE — [facility] Group Notes (Signed)
[facility]   03/13/2014 11:52 AM  Participation Quality:  Patient did not attend group.   Cem Kosman, Joesph JulyQuylle Hairston

## 2014-03-13 NOTE — [facility] Group Notes (Signed)
[facility] LCSW Group Therapy  Emotional Regulation 1:15 - 2: 30 PM        03/13/2014     Type of Therapy:  Group Therapy  Participation Level:  Appropriate  Participation Quality:  Appropriate  Affect:  Appropriate  Cognitive:  Attentive Appropriate  Insight:  Developing/Improving Engaged  Engagement in Therapy:  Developing/Improving Engaged  Modes of Intervention:  Discussion Exploration Problem-Solving Supportive  Summary of Progress/Problems:  Group topic was emotional regulations.  Patient participated in the discussion and was able to identify an emotion that needed to regulated.  She shared anger is the emotions she needs to have better control over.  Patient talked about stuffing things and then exploding to the point of becoming violent. Patient was able to identify approprite coping skills.  Wynn BankerHodnett, Lux Meaders Hairston 03/13/2014

## 2014-03-13 NOTE — Progress Notes (Signed)
[facility] MD Progress Note  03/13/2014 2:29 PM [name]  MRN:  [id number] Subjective:   " I am depressed" Objective:  Patient presents depressed, and is intermittently tearful during session. States she has been trying to contact different family members on the phone to get information about her child, and " nobody is answering the phone". Staff reports patient presents depressed and at times somewhat needy/attention seeking, but overall behavior in good control . Going to groups. Sleep has been fair but improved with Trazodone. Denies any medication side effects.  TSH within normal levels.  Diagnosis: Bipolar, Depressed    Total Time spent with patient: 20 minutes    ADL's:  Fair   Sleep: Fair   Appetite:  Fair   Suicidal Ideation:  Denies any current suicidal ideations Homicidal Ideation:  Denies any current homicidal ideations AEB (as evidenced by):  Psychiatric Specialty Exam: Physical Exam  Review of Systems  Constitutional: Negative for fever and chills.  Respiratory: Negative for cough and shortness of breath.   Cardiovascular: Negative for chest pain.  Genitourinary: Negative for dysuria and urgency.  Psychiatric/Behavioral: Positive for depression. The patient is nervous/anxious.     Blood pressure 135/78, pulse 78, temperature 98 F (36.7 C), temperature source Oral, resp. rate 18, height 5\' 2"  (1.575 m), weight 90.266 kg (199 lb), last menstrual period 02/06/2014, SpO2 98.00%.Body mass index is 36.39 kg/(m^2).  General Appearance: Fairly Groomed  Patent attorney::  Good  Speech:  Slow  Volume:  Decreased  Mood:  Anxious and Depressed  Affect:  Constricted and intermittently tearful  Thought Process:  Linear  Orientation:  Full (Time, Place, and Person)  Thought Content:  no hallucinations and no delusions  Suicidal Thoughts:  No- currently denies any thoughts of hurting self or anyone else   Homicidal Thoughts:  No  Memory:  NA  Judgement:  Fair   Insight:  Fair  Psychomotor Activity:  Normal  Concentration:  Good  Recall:  Good  Fund of Knowledge:Good  Language: Good  Akathisia:  Negative  Handed:  Right  AIMS (if indicated):     Assets:  Communication Skills Desire for Improvement Resilience  Sleep:  Number of Hours: 5.75   Musculoskeletal: Strength & Muscle Tone: within normal limits Gait & Station: normal Patient leans: N/A  Current Medications: Current Facility-Administered Medications  Medication Dose Route Frequency Provider Last Rate Last Dose  . acetaminophen (TYLENOL) tablet 650 mg  650 mg Oral Q6H PRN Kristeen Mans, NP   650 mg at 03/11/14 1814  . alum & mag hydroxide-simeth (MAALOX/MYLANTA) 200-200-20 MG/5ML suspension 30 mL  30 mL Oral Q4H PRN Kristeen Mans, NP      . clonazePAM Scarlette Calico) tablet 1 mg  1 mg Oral BID PRN Kristeen Mans, NP   1 mg at 03/13/14 1201  . dicyclomine (BENTYL) capsule 10 mg  10 mg Oral TID AC & HS Kerry Hough, PA-C   10 mg at 03/13/14 1201  . hydrOXYzine (ATARAX/VISTARIL) tablet 25 mg  25 mg Oral Q6H PRN Beau Fanny, FNP   25 mg at 03/12/14 2117  . lurasidone (LATUDA) tablet 20 mg  20 mg Oral Q breakfast Nehemiah Massed, MD   20 mg at 03/13/14 0739  . magnesium hydroxide (MILK OF MAGNESIA) suspension 30 mL  30 mL Oral Daily PRN Kristeen Mans, NP      . traZODone (DESYREL) tablet 50 mg  50 mg Oral QHS,MR X 1 Kerry Hough, PA-C  50 mg at 03/12/14 2210  . venlafaxine XR (EFFEXOR-XR) 24 hr capsule 37.5 mg  37.5 mg Oral Q breakfast Nehemiah MassedFernando Seymore Brodowski, MD   37.5 mg at 03/13/14 [id number]    Lab Results:  Results for orders placed during the hospital encounter of 03/11/14 (from the past 48 hour(s))  TSH     Status: None   Collection Time    03/13/14  6:26 AM      Result Value Ref Range   TSH 2.840  0.350 - 4.500 uIU/mL   Comment: Performed [facility]    Physical Findings: AIMS: Facial and Oral Movements Muscles of Facial Expression: None, normal Lips and Perioral Area:  None, normal Jaw: None, normal Tongue: None, normal,Extremity Movements Upper (arms, wrists, hands, fingers): None, normal Lower (legs, knees, ankles, toes): None, normal, Trunk Movements Neck, shoulders, hips: None, normal, Overall Severity Severity of abnormal movements (highest score from questions above): None, normal Incapacitation due to abnormal movements: None, normal Patient's awareness of abnormal movements (rate only patient's report): No Awareness, Dental Status Current problems with teeth and/or dentures?: No Does patient usually wear dentures?: No  CIWA:  CIWA-Ar Total: 1 COWS:  COWS Total Score: 2  Objective: Patient remains depressed and focused on her psychosocial stressors, but is not suicidal or psychotic. Tolerating Effexor XR and Latuda well thus far. Active in milieu Responds well to support, empathy  Treatment Plan Summary: Daily contact with patient to assess and evaluate symptoms and progress in treatment Medication management See below  Plan: Continue inpatient treatment and support/ group therapies, milieu. Increase EFFEXOR XR to 75 mgrs a day  For now continue LATUDA at 20 mgrs a day.  Medical Decision Making Problem Points:  Established problem, stable/improving (1), Review of last therapy session (1) and Review of psycho-social stressors (1) Data Points:  Review of new medications or change in dosage (2)  I certify that inpatient services furnished can reasonably be expected to improve the patient's condition.   Kenia Teagarden 03/13/2014, 2:29 PM

## 2014-03-13 NOTE — Progress Notes (Signed)
D: pt c/o stomach cramps. Rates pain 6/10. Denies si/hi/avh. Pt is childlike and attention seeking. Pt stated she is feeling very anxious. Pt is cooperative on unit.  A: PA on call made aware of pt stomach cramps. Bentyl ordered and given, along with trazodone, klonopin and vistaril for anxiety. Support and encouragement offered. q 15 min safety checks R: pt remains safe on unit. No further signs of stress noted.

## 2014-03-13 NOTE — Progress Notes (Signed)
Adult Psychoeducational Group Note  Date:  03/13/2014 Time:  11:10 PM  Group Topic/Focus:  Wrap-Up Group:   The focus of this group is to help patients review their daily goal of treatment and discuss progress on daily workbooks.  Participation Level:  Active  Participation Quality:  Appropriate  Affect:  Appropriate  Cognitive:  Appropriate  Insight: Appropriate  Engagement in Group:  Engaged  Modes of Intervention:  Discussion  Additional CommentsThe patient expressed that she learn from coping skills in group.The patient said that she has to talk about her anger and not let it build up.  Octavio Mannshigpen, [place] Gamero Lee 03/13/2014, 11:10 PM

## 2014-03-14 MED ORDER — DIVALPROEX SODIUM ER 500 MG PO TB24
500.0000 mg | ORAL_TABLET | Freq: Every day | ORAL | Status: DC
  Administered 2014-03-14 – 2014-03-17 (×4): 500 mg via ORAL
  Filled 2014-03-14 (×7): qty 1

## 2014-03-14 MED ORDER — TRAZODONE HCL 100 MG PO TABS
100.0000 mg | ORAL_TABLET | Freq: Every evening | ORAL | Status: DC | PRN
  Administered 2014-03-14 – 2014-03-17 (×8): 100 mg via ORAL
  Filled 2014-03-14 (×14): qty 1

## 2014-03-14 NOTE — Progress Notes (Signed)
Adult Psychoeducational Group Note  Date:  03/14/2014 Time:  8:45 AM  Group Topic/Focus:  Morning Wellness Group  Participation Level:  Did Not Attend  Additional Comments: Patient was lying in bed during group.  Harold BarbanByrd, Artha Chiasson E 03/14/2014, 11:14 AM

## 2014-03-14 NOTE — [facility] Group Notes (Signed)
[facility] LCSW Group Therapy  [facility] [facility] [employer] [facility] [place] 1:15 - 2:30 PM  03/14/2014   Type of Therapy:  Group Therapy  Participation Level: Active  Participation Quality:  Attentive  Affect:  Appropriate  Cognitive:  Appropriate  Insight:  Developing/Improving and Engaged  Engagement in Therapy:  Developing/Improving Engaged  Modes of Intervention:  Discussion, Education, Exploration, Problem-Solving, Rapport Building, Support   Summary of Progress/Problems:  Patient listened attentively to speaker from [employer].  Patient expressed interest in attending programming at [facility].   [name], [name] 03/14/2014

## 2014-03-14 NOTE — Progress Notes (Signed)
Patient ID: Diane MartyrChristina L [name], female   DOB: 12/01/1985, 28 y.o.   MRN: [id number] D: Client visible in dayroom, watching TV, interaction, reports depression at "7" of 10. "I talk to my son, he said mommy I miss you"  A: Writer provided emotional support encouraged client to keep visits with children upon discharge and incorporate coping skills into daily living.  Staff will monitor q4115min for safety. R: Pt. Is safe on unit, attends karaoke and dances.
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
## 2014-03-14 NOTE — Progress Notes (Signed)
D: Patient appropriate and cooperative with staff and peers. Patient continues to have depressed affect and mood. She reported on the self inventory sheet that sleep and appetite are fair, normal energy level and good ability to concentrate. Patient was not present for morning group. Patient adheres to all medications.  A: Support and encouragement provided to patient. Administered scheduled medications per ordering MD. Monitor Q15 minute checks for safety.  R: Patient receptive. Denies SI/HI and AVH. Patient remains safe on the unit.

## 2014-03-15 NOTE — Progress Notes (Signed)
[facility] Group Notes:  (Nursing/MHT/Case Management/Adjunct)  Date:  03/15/2014  Time:  11:05 PM  Type of Therapy:  Psychoeducational Skills  Participation Level:  Active  Participation Quality:  Attentive  Affect:  Defensive  Cognitive:  Appropriate  Insight:  Lacking  Engagement in Group:  Limited  Modes of Intervention:  Education  Summary of Progress/Problems: The patient described her day as having been "okay" today. She states that she felt "sleepy" throughout the day and that she attended one of the groups. The patient was unable to state what her relapse prevention will involve. The patient also shared that she anticipates being discharged on Monday or Tuesday.   Rainer Mounce S 03/15/2014, 11:05 PM

## 2014-03-15 NOTE — Tx Team (Signed)
Interdisciplinary Treatment Plan Update   Date Reviewed:  03/15/2014  Time Reviewed:  8:36 AM  Progress in Treatment:   Attending groups: Yes Participating in groups: Yes Taking medication as prescribed: Yes  Tolerating medication: Yes Family/Significant other contact made:  No, patient declines collateral contact Patient understands diagnosis: Yes  Discussing patient identified problems/goals with staff: Yes Medical problems stabilized or resolved: Yes Denies suicidal/homicidal ideation: Yes Patient has not harmed self or others: Yes  For review of initial/current patient goals, please see plan of care.  Estimated Length of Stay: 2-3 days  Reasons for Continued Hospitalization:  Anxiety Depression Medication stabilization   New Problems/Goals identified:    Discharge Plan or Barriers:   Home with outpatient follow up with [facility]  Additional Comments:  Continue medication stabilization  Attendees:  Patient:  03/15/2014 8:36 AM   Signature:  Sallyanne HaversF. Cobos, MD 03/15/2014 8:36 AM  Signature:  03/15/2014 8:36 AM  Signature:   03/15/2014 8:36 AM  Signature Genelle GatherPatti Dukes RN 03/15/2014 8:36 AM  Signature: Harold Barbanonecia Byrd, RN 03/15/2014 8:36 AM  Signature:  Juline PatchQuylle Fletcher Ostermiller, LCSW 03/15/2014 8:36 AM  Signature:  Reyes Ivanhelsea Horton, LCSW 03/15/2014 8:36 AM  Signature:  Cleopatra Cedareggie King, [facility] Team 03/15/2014 8:36 AM  Signature:  03/15/2014 8:36 AM  Signature:  03/15/2014  8:36 AM  Signature:   03/15/2014  8:36 AM  Signature: 03/15/2014  8:36 AM    Scribe for Treatment Team:   Juline PatchQuylle Krishna Heuer,  03/15/2014 8:36 AM

## 2014-03-15 NOTE — Progress Notes (Signed)
Patient ID: [name], female   DOB: 01/26/1986, 28 y.o.   MRN: [id number]  D: Patient with dull, flat affect endorsing depression. Pt states she needs to find a new place to live after her discharge. Pt states she is currently living with her ex-boyfriend's father temporarily until she finds another place.  A: Q 15 minute safety checks, encourage staff/peer interaction and group participation. Administer medications as ordered by MD.  R: Pt compliant with medications and group sessions. Pt denies SI or plans to harm herself. Pt states she is ready to be discharged on Monday or Tuesday.

## 2014-03-15 NOTE — [facility] Group Notes (Signed)
[facility] LCSW Group Therapy  Feelings Around Relapse 1:15 -2:30        03/15/2014   Type of Therapy:  Group Therapy  Participation Level:  Appropriate  Participation Quality:  Appropriate  Affect:  Appropriate  Cognitive:  Attentive Appropriate  Insight:  Developing/Improving  Engagement in Therapy: Developing/Improving  Modes of Intervention:  Discussion Exploration Problem-Solving Supportive  Summary of Progress/Problems:  The topic for today was feelings around relapse.    Patient processed feelings toward relapse and was able to relate to peers. Patient shared she would be isolating and focusing on not having her children.  Patient identified coping skills that can be used to prevent a relapse.   Wynn BankerHodnett, Melinda Gwinner Hairston 03/15/2014

## 2014-03-15 NOTE — Progress Notes (Signed)
Patient ID: [name], female   DOB: 1986/05/10, 28 y.o.   MRN: [id number] [facility] MD Progress Note  03/15/2014 12:52 PM [name]  MRN:  [id number] Subjective:   Patient states she feels her mood " is picking up a little bit"  Objective:  I have discussed case with treatment team Patient still depressed, but mood and affect somewhat improved. She is presenting with less constricted affect and today is not tearful.  States that she knows her child is with child's father and grandmother, and although she reports ongoing family stress and recent DSS involvement, she seems reassured in that she knows where her child is and how he is . She spoke with her child yesterday over the phone. No side effects on medications. Was started on Depakote  Yesterday, and was recently started on Effexor XR - no side effects so far. We again reviewed side effects and teratogenic effects. Abnormal involuntary movement of lower lip seems improved and is quite mild today. No disruptive behaviors on unit.  Diagnosis: Bipolar, Depressed    Total Time spent with patient: 20 minutes    ADL's:  Fair   Sleep: improved   Appetite:  Fair   Suicidal Ideation:  Denies any current suicidal ideations Homicidal Ideation:  Denies any current homicidal ideations AEB (as evidenced by):  Psychiatric Specialty Exam: Physical Exam  Review of Systems  Constitutional: Negative for fever and chills.  Respiratory: Negative for cough and shortness of breath.   Cardiovascular: Negative for chest pain.  Genitourinary: Negative for dysuria and urgency.  Psychiatric/Behavioral: Positive for depression. The patient is nervous/anxious.     Blood pressure 96/63, pulse 102, temperature 98.2 F (36.8 C), temperature source Oral, resp. rate 18, height 5\' 2"  (1.575 m), weight 90.266 kg (199 lb), last menstrual period 02/06/2014, SpO2 98.00%.Body mass index is 36.39 kg/(m^2).  General Appearance: Fairly Groomed  Proofreader::  Good  Speech:  Slow  Volume:  Decreased  Mood:  Although depressed, slowly improving  Affect: less constricted, briefly smiles at times, not tearful today  Thought Process:  Linear  Orientation:  Full (Time, Place, and Person)  Thought Content:  no hallucinations and no delusions  Suicidal Thoughts:  No- currently denies any thoughts of hurting self or anyone else   Homicidal Thoughts:  No  Memory:  NA  Judgement:  Fair  Insight:  Fair  Psychomotor Activity:  Normal  Concentration:  Good  Recall:  Good  Fund of Knowledge:Good  Language: Good  Akathisia:  Negative  Handed:  Right  AIMS (if indicated):     Assets:  Communication Skills Desire for Improvement Resilience  Sleep:  Number of Hours: 5.75   Musculoskeletal: Strength & Muscle Tone: within normal limits Gait & Station: normal Patient leans: N/A  Current Medications: Current Facility-Administered Medications  Medication Dose Route Frequency Provider Last Rate Last Dose  . acetaminophen (TYLENOL) tablet 650 mg  650 mg Oral Q6H PRN Kristeen Mans, NP   650 mg at 03/11/14 1814  . alum & mag hydroxide-simeth (MAALOX/MYLANTA) 200-200-20 MG/5ML suspension 30 mL  30 mL Oral Q4H PRN Kristeen Mans, NP      . clonazePAM Scarlette Calico) tablet 1 mg  1 mg Oral BID PRN Kristeen Mans, NP   1 mg at 03/14/14 2140  . dicyclomine (BENTYL) capsule 10 mg  10 mg Oral TID AC & HS Kerry Hough, PA-C   10 mg at 03/15/14 1206  . divalproex (DEPAKOTE ER) 24  hr tablet 500 mg  500 mg Oral QHS Nehemiah MassedFernando Cobos, MD   500 mg at 03/14/14 2140  . hydrOXYzine (ATARAX/VISTARIL) tablet 25 mg  25 mg Oral Q6H PRN Beau FannyJohn C Withrow, FNP   25 mg at 03/12/14 2117  . magnesium hydroxide (MILK OF MAGNESIA) suspension 30 mL  30 mL Oral Daily PRN Kristeen MansFran E Hobson, NP      . traZODone (DESYREL) tablet 100 mg  100 mg Oral QHS,MR X 1 Nehemiah MassedFernando Cobos, MD   100 mg at 03/14/14 2348  . venlafaxine XR (EFFEXOR-XR) 24 hr capsule 75 mg  75 mg Oral Q breakfast Nehemiah MassedFernando  Cobos, MD   75 mg at 03/15/14 [id number]    Lab Results:  No results found for this or any previous visit (from the past 48 hour(s)).  Physical Findings: AIMS: Facial and Oral Movements Muscles of Facial Expression: None, normal Lips and Perioral Area: None, normal Jaw: None, normal Tongue: None, normal,Extremity Movements Upper (arms, wrists, hands, fingers): None, normal Lower (legs, knees, ankles, toes): None, normal, Trunk Movements Neck, shoulders, hips: None, normal, Overall Severity Severity of abnormal movements (highest score from questions above): None, normal Incapacitation due to abnormal movements: None, normal Patient's awareness of abnormal movements (rate only patient's report): No Awareness, Dental Status Current problems with teeth and/or dentures?: No Does patient usually wear dentures?: No  CIWA:  CIWA-Ar Total: 1 COWS:  COWS Total Score: 2  Assessment: Slow improvement of mood and affect, but today affect is improved compared to admission. Seems less intensely ruminative about family stressors. So far tolerating new medications well, consisting of Effexor XR and Depakote ER.   Treatment Plan Summary: Daily contact with patient to assess and evaluate symptoms and progress in treatment Medication management See below  Plan: Continue inpatient treatment and support/ group therapies, milieu. EFFEXOR XR to 75 mgrs QDAY DEPAKOTE ER at 500 mgrs QHS  Medical Decision Making Problem Points:  Established problem, stable/improving (1), Review of last therapy session (1) and Review of psycho-social stressors (1) Data Points:  Review of new medications or change in dosage (2)  I certify that inpatient services furnished can reasonably be expected to improve the patient's condition.   COBOS, FERNANDO 03/15/2014, 12:52 PM

## 2014-03-15 NOTE — Progress Notes (Signed)
D: Patient's affect/mood are flat and depressed. She reported on the self inventory sheet that sleep and appetite are fair, energy level is normal and concentration today is good. Patient rates depression "7", feelings of hopelessness "1" and anxiety "5". She's not participating in group sessions. Writer observed that the patient has been resting in bed all day thus far. Patient compliant with medication regimen.   A: Support and encouragement provided to patient. Scheduled medications administered per MD orders. Maintain Q15 minute checks for safety.  R: Patient receptive. Denies SI/HI. Patient remains safe.

## 2014-03-16 DIAGNOSIS — F3130 Bipolar disorder, current episode depressed, mild or moderate severity, unspecified: Secondary | ICD-10-CM

## 2014-03-16 NOTE — Progress Notes (Signed)
Patient ID: Diane MartyrChristina L [name], female   DOB: 02/07/1986, 28 y.o.   MRN: [id number]  D: Patient with dull, flat affect endorsing depression. Pt with complaints of anxiety. PRN medications given as ordered. Pt with minimal interaction in milieu. A: Q 15 minute safety checks, encourage staff/peer interaction and group participation. Administer medications as ordered by MD. Encourage sharing of feelings. R: Patient compliant with medications and group session. Pt denies SI or plans to harm herself and verbally contracts for safety while on unit. Pt states she is looking forward to being discharged soon so she can work on finding a new place to live.

## 2014-03-16 NOTE — [facility] Group Notes (Signed)
[facility] Group Notes:  (Clinical Social Work)  03/16/2014   1:15-2:15PM  Summary of Progress/Problems:   The main focus of today's process group was for the patient to identify ways in which they have sabotaged their own mental health wellness/recovery.  Motivational interviewing and a handout were used to explore the benefits and costs of their self-sabotaging behavior as well as the benefits and costs of changing this behavior.  The Stages of Change were explained to the group using a handout, and patients identified where they are with regard to changing self-defeating behaviors.  The patient expressed she self-sabotages with isolation.  She states she does this in order to retain her anger and not explode at someone else.  Her affect was flat and she appeared to be both anxious and depressed throughout group.  She spoke little, and only when called on directly.  Type of Therapy:  Process Group  Participation Level:  Active  Participation Quality:  Attentive  Affect:  Anxious, Depressed, Flat and Irritable  Cognitive:  Alert  Insight:  Developing/Improving  Engagement in Therapy:  Developing/Improving  Modes of Intervention:  Education, Motivational Interviewing   Ambrose MantleMareida Grossman-Orr, LCSW 03/16/2014, 4:00pm

## 2014-03-16 NOTE — Progress Notes (Signed)
Patient ID: [name], female   DOB: 08/07/1986, 28 y.o.   MRN: [id number] [facility] MD Progress Note  03/16/2014 10:02 AM [name]  MRN:  [id number] Subjective:   Patient states she is good, and ready to go home. She would like to go home on Monday as this is the only day she has transportation. Her mood has improved, she is no longer feeling as depressed and hopeless. Currently rates her depression at 2-3/10, anxiety 1/10, and hopelessness 0/10. She reports not resting well at night, which contributes to her sleeping during the day. She states she tosses and turns and never sleeping well even with prescription medication (Trazadone).   Objective:  Patient seen and chart reviewed. She is observed lying in bed. She states she goes to most afternoon group sessions, and that she goes back to bed in the morning. Patient still depressed, but mood and affect somewhat improved. She made several attempts to reach out to her child yesterday but was unsuccessful.  No side effects on medications. Reports compliance with Depakote and Effexor XR - denies s/e at this time. We again reviewed side effects and teratogenic effects.  No disruptive behaviors on unit.  Diagnosis: Bipolar, Depressed    Total Time spent with patient: 20 minutes  ADL's:  Fair   Sleep: fair   Appetite:  Fair   Suicidal Ideation:  Denies any current suicidal ideations Homicidal Ideation:  Denies any current homicidal ideations AEB (as evidenced by):  Psychiatric Specialty Exam: Physical Exam   Review of Systems  Constitutional: Negative for fever and chills.  Respiratory: Negative for cough and shortness of breath.   Cardiovascular: Negative for chest pain.  Genitourinary: Negative for dysuria and urgency.  Psychiatric/Behavioral: Positive for depression. The patient is nervous/anxious.     Blood pressure 119/71, pulse 84, temperature 97.9 F (36.6 C), temperature source Oral, resp. rate 20, height 5\' 2"   (1.575 m), weight 90.266 kg (199 lb), last menstrual period 02/06/2014, SpO2 98.00%.Body mass index is 36.39 kg/(m^2).  General Appearance: Fairly Groomed  Patent attorney::  Good  Speech:  Slow  Volume:  Normal  Mood:  Although depressed,  Affect: constricted   Thought Process:  Circumstantial and Linear  Orientation:  Full (Time, Place, and Person)  Thought Content:  no hallucinations and no delusions  Suicidal Thoughts:  No- currently denies any thoughts of hurting self or anyone else   Homicidal Thoughts:  No  Memory:  NA  Judgement:  Fair  Insight:  Fair  Psychomotor Activity:  Normal  Concentration:  Good  Recall:  Good  Fund of Knowledge:Good  Language: Good  Akathisia:  Negative  Handed:  Right  AIMS (if indicated):     Assets:  Communication Skills Desire for Improvement Resilience  Sleep:  Number of Hours: 6   Musculoskeletal: Strength & Muscle Tone: within normal limits Gait & Station: normal Patient leans: N/A  Current Medications: Current Facility-Administered Medications  Medication Dose Route Frequency Provider Last Rate Last Dose  . acetaminophen (TYLENOL) tablet 650 mg  650 mg Oral Q6H PRN Kristeen Mans, NP   650 mg at 03/11/14 1814  . alum & mag hydroxide-simeth (MAALOX/MYLANTA) 200-200-20 MG/5ML suspension 30 mL  30 mL Oral Q4H PRN Kristeen Mans, NP      . clonazePAM Scarlette Calico) tablet 1 mg  1 mg Oral BID PRN Kristeen Mans, NP   1 mg at 03/15/14 2059  . dicyclomine (BENTYL) capsule 10 mg  10 mg Oral TID  AC & HS Kerry HoughSpencer E Simon, PA-C   10 mg at 03/15/14 2059  . divalproex (DEPAKOTE ER) 24 hr tablet 500 mg  500 mg Oral QHS Nehemiah MassedFernando Cobos, MD   500 mg at 03/15/14 2059  . hydrOXYzine (ATARAX/VISTARIL) tablet 25 mg  25 mg Oral Q6H PRN Beau FannyJohn C Withrow, FNP   25 mg at 03/15/14 2059  . magnesium hydroxide (MILK OF MAGNESIA) suspension 30 mL  30 mL Oral Daily PRN Kristeen MansFran E Hobson, NP      . traZODone (DESYREL) tablet 100 mg  100 mg Oral QHS,MR X 1 Nehemiah MassedFernando Cobos, MD   100  mg at 03/15/14 2249  . venlafaxine XR (EFFEXOR-XR) 24 hr capsule 75 mg  75 mg Oral Q breakfast Nehemiah MassedFernando Cobos, MD   75 mg at 03/15/14 [id number]    Lab Results:  No results found for this or any previous visit (from the past 48 hour(s)).  Physical Findings: AIMS: Facial and Oral Movements Muscles of Facial Expression: None, normal Lips and Perioral Area: None, normal Jaw: None, normal Tongue: None, normal,Extremity Movements Upper (arms, wrists, hands, fingers): None, normal Lower (legs, knees, ankles, toes): None, normal, Trunk Movements Neck, shoulders, hips: None, normal, Overall Severity Severity of abnormal movements (highest score from questions above): None, normal Incapacitation due to abnormal movements: None, normal Patient's awareness of abnormal movements (rate only patient's report): No Awareness, Dental Status Current problems with teeth and/or dentures?: No Does patient usually wear dentures?: No  CIWA:  CIWA-Ar Total: 1 COWS:  COWS Total Score: 2  Assessment: Slow improvement of mood and affect, but today affect is improved compared to admission. So far tolerating new medications well, consisting of Effexor XR and Depakote ER. She is encouraged to participate in morning and afternoon group sessions.Discussed the importance of attending group sessions as it is vital to her treatment plan. She went to back bed despite efforts to get her to join the group.    Treatment Plan Summary: Daily contact with patient to assess and evaluate symptoms and progress in treatment Medication management See below  Plan: Continue inpatient treatment and support/ group therapies, milieu.  EFFEXOR XR to 75 mgrs QDAY DEPAKOTE ER at 500 mgrs QHS  Medical Decision Making Problem Points:  Established problem, stable/improving (1), Review of last therapy session (1) and Review of psycho-social stressors (1) Data Points:  Review of new medications or change in dosage (2)  I certify that  inpatient services furnished can reasonably be expected to improve the patient's condition.   Malachy ChamberSTARKES, Kennedy Bohanon S FNP-BC  03/16/2014, 10:02 AM

## 2014-03-16 NOTE — [facility] Group Notes (Signed)
[facility] Group Notes:  (Nursing/MHT/Case Management/Adjunct)  Date:  03/16/2014  Time:  11:27 AM  Type of Therapy:  Psychoeducational Skills  Participation Level:  Did Not Attend  Brandy Kabat Shanta 03/16/2014, 11:27 AM 

## 2014-03-16 NOTE — Progress Notes (Signed)
Patient ID: [name], female   DOB: 03/01/1986, 28 y.o.   MRN: [id number]   D: Pt has been appropriate on the unit today, she did not attend morning group due to being tired. Pt reported that she was not a morning person, that why she did not attend group. Once patient was up she attended all groups and engaged in treatment. Pt took all medications without any problems, she reported no issues or concerns. Pt reported being negative SI/HI, no AH/VH noted. A: 15 min checks continued for patient safety. R: Pt safety maintained.

## 2014-03-17 NOTE — [facility] Group Notes (Signed)
[facility] Group Notes:  (Clinical Social Work)  03/17/2014   1:15-2:15PM  Summary of Progress/Problems:  The main focus of today's process group was to   identify the patient's current support system and decide on other supports that can be put in place.  The picture on workbook was used to discuss why additional supports are needed.  An emphasis was placed on using counselor, doctor, therapy groups, 12-step groups, and problem-specific support groups to expand supports.   There was also an extensive discussion about what constitutes a healthy support versus an unhealthy support.  The patient expressed full comprehension of the concepts presented.  One current health support is her grandma.  She does have a doctor who does her prescriptions as well as a therapist.  She was very quiet, left before the end of group.  Type of Therapy:  Process Group  Participation Level:  Active  Participation Quality:  Attentive   Affect:  Blunted and Anxious  Cognitive:  Appropriate and Oriented  Insight:  Developing/Improving  Engagement in Therapy:  Improving  Modes of Intervention:  Education,  Support and Processing  Ambrose MantleMareida Grossman-Orr, LCSW 03/17/2014, 4:00pm

## 2014-03-17 NOTE — [facility] Group Notes (Signed)
[facility] Group Notes:  (Nursing/MHT/Case Management/Adjunct)  Date:  03/17/2014  Time:  10:54 AM  Type of Therapy:  Psychoeducational Skills  Participation Level:  Did Not Attend   Buford DresserForrest, Page Lancon Shanta 03/17/2014, 10:54 AM

## 2014-03-17 NOTE — Progress Notes (Signed)
[facility] Group Notes:  (Nursing/MHT/Case Management/Adjunct)  Date:  03/17/2014  Time:  3:11 AM  Type of Therapy:  Psychoeducational Skills  Participation Level:  Minimal  Participation Quality:  Attentive  Affect:  Depressed  Cognitive:  Appropriate  Insight:  Improving  Engagement in Group:  Developing/Improving  Modes of Intervention:  Education  Summary of Progress/Problems: Patient states that she slept half of the day, but was pleased with the fact that she went outside. As a theme for the day, her support system is going to be her therapist.   [name], [name] 03/17/2014, 3:11 AM

## 2014-03-17 NOTE — Progress Notes (Signed)
Psychoeducational Group Note  Date: 03/17/2014 Time: 1100  Group Topic/Focus:  Making Healthy Choices:   The focus of this group is to help patients identify negative/unhealthy choices they were using prior to admission and identify positive/healthier coping strategies to replace them upon discharge.  Participation Level:  Did Not Attend  PAdditional Comments:    03/17/2014,6:37 PM [name], [name]

## 2014-03-17 NOTE — Progress Notes (Signed)
Patient ID: [name], female   DOB: 07/16/1986, 28 y.o.   MRN: [id number]   D: Pt has been appropriate on the unit today, she did not attend morning group due to being tired. Pt reported that she was not a morning person, that why she did not attend group. Once patient was up she attended all groups and engaged in treatment. Pt took all medications without any problems, she reported no issues or concerns. Pt reported being negative SI/HI, no AH/VH noted. A: 15 min checks continued for patient safety. R: Pt safety maintained.    

## 2014-03-17 NOTE — Progress Notes (Signed)
D Pt. Denies SI and HI, no complaints of pain or discomfort note.d    A Writer offered support and encouragement,  Discussed discharge plans with pt.  R. Pt. Remains safe on the unit,  States she is ready for discharge tomorrow.  Pt. Is slow to respond.

## 2014-03-17 NOTE — Progress Notes (Signed)
Patient ID: [name], female   DOB: Apr 25, 1986, 28 y.o.   MRN: [id number] [facility] MD Progress Note  03/17/2014 12:49 PM JAHDAI PADOVANO  MRN:  [id number] Subjective:   Patient states she is good, and ready to go home. She would like to go home on Monday as this is the only day she has transportation. Her mood has improved, she is no longer feeling as depressed and hopeless. Currently rates her depression at 2/10, anxiety 2/10, and hopelessness 0/10. She reports not resting well at night, which contributes to her sleeping during the day. She states she tosses and turns and never sleeping well even with prescription medication (Trazadone). Upon discharge she plans to get her son back who is with her ex-boyfriend(not the biological father). She plans on staying at her ex-boyfriends dads house.    Objective:  Patient seen and chart reviewed. She is observed in the dayroom comforting one of her female peers. She states she goes to most afternoon group sessions, and that she goes back to bed in the morning. Patient still depressed, but mood and affect somewhat improved. No side effects on medications. Reports compliance with Depakote and Effexor XR - denies s/e at this time. No disruptive behaviors on unit.  Diagnosis: Bipolar, Depressed    Total Time spent with patient: 20 minutes  ADL's:  Fair   Sleep: fair   Appetite:  Fair   Suicidal Ideation:  Denies any current suicidal ideations Homicidal Ideation:  Denies any current homicidal ideations AEB (as evidenced by):  Psychiatric Specialty Exam: Physical Exam   Review of Systems  Constitutional: Negative for fever and chills.  Respiratory: Negative for cough and shortness of breath.   Cardiovascular: Negative for chest pain.  Genitourinary: Negative for dysuria and urgency.  Psychiatric/Behavioral: Positive for depression. The patient is nervous/anxious.     Blood pressure 101/69, pulse 71, temperature 98.2 F (36.8 C),  temperature source Oral, resp. rate 16, height 5\' 2"  (1.575 m), weight 90.266 kg (199 lb), last menstrual period 02/06/2014, SpO2 98.00%.Body mass index is 36.39 kg/(m^2).  General Appearance: Fairly Groomed  Patent attorney::  Good  Speech:  Clear and Coherent and Normal Rate  Volume:  Normal  Mood:  Depressed   Affect: constricted   Thought Process:  Circumstantial and Linear  Orientation:  Full (Time, Place, and Person)  Thought Content:  no hallucinations and no delusions  Suicidal Thoughts:  No- currently denies any thoughts of hurting self or anyone else   Homicidal Thoughts:  No  Memory:  NA  Judgement:  Fair  Insight:  Fair  Psychomotor Activity:  Normal  Concentration:  Good  Recall:  Good  Fund of Knowledge:Good  Language: Good  Akathisia:  Negative  Handed:  Right  AIMS (if indicated):     Assets:  Communication Skills Desire for Improvement Resilience  Sleep:  Number of Hours: 6.25   Musculoskeletal: Strength & Muscle Tone: within normal limits Gait & Station: normal Patient leans: N/A  Current Medications: Current Facility-Administered Medications  Medication Dose Route Frequency Provider Last Rate Last Dose  . acetaminophen (TYLENOL) tablet 650 mg  650 mg Oral Q6H PRN Kristeen Mans, NP   650 mg at 03/17/14 1156  . alum & mag hydroxide-simeth (MAALOX/MYLANTA) 200-200-20 MG/5ML suspension 30 mL  30 mL Oral Q4H PRN Kristeen Mans, NP      . clonazePAM Scarlette Calico) tablet 1 mg  1 mg Oral BID PRN Kristeen Mans, NP   1 mg at  03/16/14 2035  . dicyclomine (BENTYL) capsule 10 mg  10 mg Oral TID AC & HS Kerry HoughSpencer E Simon, PA-C   10 mg at 03/17/14 1156  . divalproex (DEPAKOTE ER) 24 hr tablet 500 mg  500 mg Oral QHS Nehemiah MassedFernando Cobos, MD   500 mg at 03/16/14 2035  . hydrOXYzine (ATARAX/VISTARIL) tablet 25 mg  25 mg Oral Q6H PRN Beau FannyJohn C Withrow, FNP   25 mg at 03/16/14 2035  . magnesium hydroxide (MILK OF MAGNESIA) suspension 30 mL  30 mL Oral Daily PRN Kristeen MansFran E Hobson, NP      .  traZODone (DESYREL) tablet 100 mg  100 mg Oral QHS,MR X 1 Nehemiah MassedFernando Cobos, MD   100 mg at 03/16/14 2234  . venlafaxine XR (EFFEXOR-XR) 24 hr capsule 75 mg  75 mg Oral Q breakfast Nehemiah MassedFernando Cobos, MD   75 mg at 03/17/14 [id number]    Lab Results:  No results found for this or any previous visit (from the past 48 hour(s)).  Physical Findings: AIMS: Facial and Oral Movements Muscles of Facial Expression: None, normal Lips and Perioral Area: None, normal Jaw: None, normal Tongue: None, normal,Extremity Movements Upper (arms, wrists, hands, fingers): None, normal Lower (legs, knees, ankles, toes): None, normal, Trunk Movements Neck, shoulders, hips: None, normal, Overall Severity Severity of abnormal movements (highest score from questions above): None, normal Incapacitation due to abnormal movements: None, normal Patient's awareness of abnormal movements (rate only patient's report): No Awareness, Dental Status Current problems with teeth and/or dentures?: No Does patient usually wear dentures?: No  CIWA:  CIWA-Ar Total: 1 COWS:  COWS Total Score: 2  Assessment: Slow improvement of mood and affect, but today affect is improved compared to admission. So far tolerating new medications well, consisting of Effexor XR and Depakote ER. She is encouraged to participate in morning and afternoon group sessions.Discussed the importance of attending group sessions as it is vital to her treatment plan. She went to back bed despite efforts to get her to join the group.    Treatment Plan Summary: Daily contact with patient to assess and evaluate symptoms and progress in treatment Medication management See below  Plan: Continue inpatient treatment and support/ group therapies, milieu.  EFFEXOR XR to 75 mgrs QDAY DEPAKOTE ER at 500 mgrs QHS  Medical Decision Making Problem Points:  Established problem, stable/improving (1), Review of last therapy session (1) and Review of psycho-social stressors  (1) Data Points:  Review of new medications or change in dosage (2)  I certify that inpatient services furnished can reasonably be expected to improve the patient's condition.

## 2014-03-17 NOTE — Progress Notes (Signed)
[facility] Group Notes:  (Nursing/MHT/Case Management/Adjunct)  Date:  03/17/2014  Time:  9:55 PM  Type of Therapy:  Psychoeducational Skills  Participation Level:  Minimal  Participation Quality:  Attentive  Affect:  Flat  Cognitive:  Appropriate  Insight:  Lacking  Engagement in Group:  Resistant  Modes of Intervention:  Education  Summary of Progress/Problems: The patient mentioned that she had a good day overall since she was able to talk with her peers. No additional details were provided and was hesitant to share with the group. The patient's goal for tomorrow is to get discharged.   Hazle CocaGOODMAN, Gianluca Chhim S 03/17/2014, 9:55 PM

## 2014-03-18 DIAGNOSIS — F332 Major depressive disorder, recurrent severe without psychotic features: Principal | ICD-10-CM

## 2014-03-18 MED ORDER — TRAZODONE HCL 100 MG PO TABS: 100.0000 mg | ORAL_TABLET | Freq: Every evening | ORAL | Status: DC | PRN

## 2014-03-18 MED ORDER — CLONAZEPAM 1 MG PO TABS: 1.0000 mg | ORAL_TABLET | Freq: Two times a day (BID) | ORAL | Status: DC | PRN

## 2014-03-18 MED ORDER — DIVALPROEX SODIUM ER 500 MG PO TB24: 500.0000 mg | ORAL_TABLET | Freq: Every day | ORAL | Status: DC

## 2014-03-18 MED ORDER — VENLAFAXINE HCL ER 75 MG PO CP24: 75.0000 mg | ORAL_CAPSULE | Freq: Every day | ORAL | Status: DC

## 2014-03-18 NOTE — Progress Notes (Signed)
Patient ID: [name], female   DOB: 07/20/1986, 28 y.o.   MRN: [id number]   D: Pt has been in the bed most of the day, she reported that she just wanted to be discharged. Pt reported that she was no longer depressed and that she felt much better. Pt took all medications without any problems, no issues or concerns noted. Pt reported being negative SI/HI, no AH/VH noted. A: 15 min checks continued for patient safety. R: Pt safety maintained.

## 2014-03-18 NOTE — Discharge Summary (Signed)
Physician Discharge Summary Note  Patient:  [name] is an 28 y.o., female MRN:  [id number] DOB:  1985-12-15 Patient phone:  [phone] (home)  Patient address:   [address] [place] [address],  Total Time spent with patient: Greater than 30 minutes  Date of Admission:  03/11/2014 Date of Discharge: 03/18/14  Reason for Admission: Mood stablization  Discharge Diagnoses: Active Problems:   MDD (major depressive disorder), recurrent episode, severe   MDD (major depressive disorder)   Psychiatric Specialty Exam: Physical Exam  Psychiatric: Her speech is normal and behavior is normal. Judgment and thought content normal. Her mood appears not anxious. Her affect is not angry, not blunt, not labile and not inappropriate. Cognition and memory are normal. She does not exhibit a depressed mood.    Review of Systems  Constitutional: Negative.   HENT: Negative.   Eyes: Negative.   Respiratory: Negative.   Gastrointestinal: Negative.   Genitourinary: Negative.   Musculoskeletal: Negative.   Skin: Negative.   Neurological: Negative.   Endo/Heme/Allergies: Negative.   Psychiatric/Behavioral: Positive for depression (Stable). Negative for suicidal ideas, hallucinations, memory loss and substance abuse. The patient has insomnia (Stable). The patient is not nervous/anxious.     Blood pressure 105/62, pulse 106, temperature 98 F (36.7 C), temperature source Oral, resp. rate 18, height 5\' 2"  (1.575 m), weight 90.266 kg (199 lb), last menstrual period 02/06/2014, SpO2 98.00%.Body mass index is 36.39 kg/(m^2).   General Appearance: Fairly Groomed   Patent attorney:: Fair   Speech: Clear and Coherent   Volume: Normal   Mood: Euthymic   Affect: Appropriate   Thought Process: Coherent and Goal Directed   Orientation: Full (Time, Place, and Person)   Thought Content: plans as she moves on   Suicidal Thoughts: No   Homicidal Thoughts: No   Memory: Immediate; Fair   Recent; Fair  Remote; Fair   Judgement: Fair   Insight: Present   Psychomotor Activity: Normal   Concentration: Fair   Recall: [employer] of Knowledge:NA   Language: Fair   Akathisia: No   Handed:   AIMS (if indicated):   Assets: Desire for Improvement  Social Support   Sleep: Number of Hours: 6.25    Past Psychiatric History: Diagnosis: MDD (major depressive disorder), recurrent episode, severe  Hospitalizations: [facility] adult unit  Outpatient Care: [facility]  Substance Abuse Care: NA  Self-Mutilation: NA  Suicidal Attempts: NA  Violent Behaviors: NA   Musculoskeletal: Strength & Muscle Tone: within normal limits Gait & Station: normal Patient leans: N/A  DSM5: Schizophrenia Disorders:  NA Obsessive-Compulsive Disorders:  NA Trauma-Stressor Disorders:  NA Substance/Addictive Disorders:  NA Depressive Disorders:  MDD (major depressive disorder), recurrent episode, severe  Axis Diagnosis:  AXIS I:  MDD (major depressive disorder), recurrent episode, severe AXIS II:  Deferred AXIS III:   Past Medical History  Diagnosis Date  . Asthma   . Bipolar 1 disorder   . Anxiety   . Endometriosis   . Mental disorder   . Depression    AXIS IV:  other psychosocial or environmental problems and mental illness, chronic AXIS V:  63  Level of Care:  OP  Hospital Course:  [name] is an 28 y.o. female presenting to [facility] ED with suicidal ideations and a plan to cut her wrist. Pt reported that she contacted the suicide hotline and they sent someone to her home. Pt also reported that she was transported to the hospital by  mobile crisis. Pt stated "my baby daddy took my son". "If I could I would cut my wrist".   While a patient in this hospital, [name] received medication management for mood stabilization. She was medicated and discharged on Effexor-XR 75 mg daily for symptoms of depression, Depakote ER 500 mg Q bedtime for mood stabilization, Clonazepam 1  mg twice daily for high anxiety levels and Trazodone 100 mg Q bedtime for insomnia. She also enrolled in the group counseling sessions and AA/NA meetings being offered and held on this unit. She learned coping skills. [name] presented no other significant health issues that required treatments and or monitoring. She tolerated her treatment regimen without any adverse effects reported.  [name] responded well to her treatment regimen and her mood is stable. This evidenced by her reports of improved mood, presentation of good affects/eye contact and absence of suicidal ideations. She is currently being discharged to continue psychiatric treatment and routine medication management [facility] [place]. She is provided with all the pertinent information required to make this appointment without problems.  Upon discharge, she adamantly denies any SIHI, AVH, delusional thoughts, paranoia and or withdrawal symptoms. She was provided with a 14 days worth, supply samples [facility]. [facility] in no distress. Transportation per father.  Consults:  psychiatry  Significant Diagnostic Studies:  labs: CBC with diff, CMP, UDS, toxicology tests, U/A  Discharge Vitals:   Blood pressure 105/62, pulse 106, temperature 98 F (36.7 C), temperature source Oral, resp. rate 18, height 5\' 2"  (1.575 m), weight 90.266 kg (199 lb), last menstrual period 02/06/2014, SpO2 98.00%. Body mass index is 36.39 kg/(m^2). Lab Results:   No results found for this or any previous visit (from the past 72 hour(s)).  Physical Findings: AIMS: Facial and Oral Movements Muscles of Facial Expression: None, normal Lips and Perioral Area: None, normal Jaw: None, normal Tongue: None, normal,Extremity Movements Upper (arms, wrists, hands, fingers): None, normal Lower (legs, knees, ankles, toes): None, normal, Trunk Movements Neck, shoulders, hips: None,  normal, Overall Severity Severity of abnormal movements (highest score from questions above): None, normal Incapacitation due to abnormal movements: None, normal Patient's awareness of abnormal movements (rate only patient's report): No Awareness, Dental Status Current problems with teeth and/or dentures?: No Does patient usually wear dentures?: No  CIWA:  CIWA-Ar Total: 1 COWS:  COWS Total Score: 2  Psychiatric Specialty Exam: See Psychiatric Specialty Exam and Suicide Risk Assessment completed by Attending Physician prior to discharge.  Discharge destination:  Home  Is patient on multiple antipsychotic therapies at discharge:  No   Has Patient had three or more failed trials of antipsychotic monotherapy by history:  No  Recommended Plan for Multiple Antipsychotic Therapies: NA    Medication List    STOP taking these medications       cholecalciferol 1000 UNITS tablet  Commonly known as:  VITAMIN D     lurasidone 40 MG Tabs tablet  Commonly known as:  LATUDA      TAKE these medications     Indication   clonazePAM 1 MG tablet  Commonly known as:  KLONOPIN  Take 1 tablet (1 mg total) by mouth 2 (two) times daily as needed (anxiety).   Indication:  Anxiety     divalproex 500 MG 24 hr tablet  Commonly known as:  DEPAKOTE ER  Take 1 tablet (500 mg total) by mouth at bedtime. For mood stabilization   Indication:  Mood stabilization     traZODone 100 MG tablet  Commonly known as:  DESYREL  Take 1 tablet (100 mg total) by mouth at bedtime and may repeat dose one time if needed. For sleep   Indication:  Trouble Sleeping     venlafaxine XR 75 MG 24 hr capsule  Commonly known as:  EFFEXOR-XR  Take 1 capsule (75 mg total) by mouth daily with breakfast. For sleep   Indication:  Major Depressive Disorder, Pain       Follow-up Information   Follow up with [facility]  On 03/20/2014. (You are scheduled with Ms. Terrilee Croak at 2:30 PM on Wednesday, March 20, 2014)     Contact information:   [address] [place], [place]   [address]  [phone]     Follow-up recommendations:  Activity:  As tolerated Diet: As recommended by your primary care doctor. Keep all scheduled follow-up appointments as recommended.  Comments:  Take all your medications as prescribed by your mental healthcare provider. Report any adverse effects and or reactions from your medicines to your outpatient provider promptly. Patient is instructed and cautioned to not engage in alcohol and or illegal drug use while on prescription medicines. In the event of worsening symptoms, patient is instructed to call the crisis hotline, 911 and or go to the nearest ED for appropriate evaluation and treatment of symptoms. Follow-up with your primary care provider for your other medical issues, concerns and or health care needs.   Total Discharge Time:  Greater than 30 minutes.  Signed: Sanjuana Kava, PMHNP-BC 03/19/2014, 10:09 AM I personally assessed the patient and formulated the plan Madie Reno A. Dub Mikes, M.D.

## 2014-03-18 NOTE — Progress Notes (Signed)
[facility] Discharge [facility] :  Will you be returning to the same living situation after discharge: Yes,  Pt will return home At discharge, do you have transportation home?:Yes,  Pt's ex-boyfriend's father will provide transportation Do you have the ability to pay for your medications:Yes,  Pt provided with prescriptions   Release of information consent forms completed and in the chart;  Patient's signature needed at discharge.  Patient to Follow up at: Follow-up Information   Follow up with [facility]  On 03/20/2014. (You are scheduled with Ms. Terrilee CroakKnight at 2:30 PM on Wednesday, March 20, 2014)    Contact information:   [address] [place], [place]   [id number]  [phone]      Patient denies SI/HI:   Yes,  Pt no longer suicidal    Safety Planning and Suicide Prevention discussed:  No. Pt refused family contact.  Elaina Hoopsarter, Vir W[place]ine M 03/18/2014, 4:02 PM

## 2014-03-18 NOTE — [facility] Suicide Risk Assessment (Signed)
Suicide Risk Assessment  Discharge Assessment     Demographic Factors:  Caucasian  Total Time spent with patient: 30 minutes  Psychiatric Specialty Exam:     Blood pressure 105/62, pulse 106, temperature 98 F (36.7 C), temperature source Oral, resp. rate 18, height 5\' 2"  (1.575 m), weight 90.266 kg (199 lb), last menstrual period 02/06/2014, SpO2 98.00%.Body mass index is 36.39 kg/(m^2).  General Appearance: Fairly Groomed  Patent attorneyye Contact::  Fair  Speech:  Clear and Coherent  Volume:  Normal  Mood:  Euthymic  Affect:  Appropriate  Thought Process:  Coherent and Goal Directed  Orientation:  Full (Time, Place, and Person)  Thought Content:  plans as she moves on  Suicidal Thoughts:  No  Homicidal Thoughts:  No  Memory:  Immediate;   Fair Recent;   Fair Remote;   Fair  Judgement:  Fair  Insight:  Present  Psychomotor Activity:  Normal  Concentration:  Fair  Recall:  [employer]  Fund of Knowledge:NA  Language: Fair  Akathisia:  No  Handed:    AIMS (if indicated):     Assets:  Desire for Improvement Social Support  Sleep:  Number of Hours: 6.25    Musculoskeletal: Strength & Muscle Tone: within normal limits Gait & Station: normal Patient leans: N/A   Mental Status Per Nursing Assessment::   On Admission:  Suicide plan;Intention to act on suicide plan  Current Mental Status by Physician: In full contact with reality. She is willing and motivated to pursue outpatient treatment. Stays she is trying to get disability for her "Bipolar and depression" as a chronic illness she is aware of what she needs to do to mange it  Loss Factors: NA  Historical Factors: NA  Risk Reduction Factors:   Sense of responsibility to family and Positive social support  Continued Clinical Symptoms:  Bipolar Disorder:   Depressive phase  Cognitive Features That Contribute To Risk:  Closed-mindedness Polarized thinking Thought constriction (tunnel vision)    Suicide Risk:  Minimal: No  identifiable suicidal ideation.  Patients presenting with no risk factors but with morbid ruminations; may be classified as minimal risk based on the severity of the depressive symptoms  Discharge Diagnoses:   AXIS I:  Bipolar Depressed AXIS II:  No diagnosis AXIS III:   Past Medical History  Diagnosis Date  . Asthma   . Bipolar 1 disorder   . Anxiety   . Endometriosis   . Mental disorder   . Depression    AXIS IV:  other psychosocial or environmental problems AXIS V:  61-70 mild symptoms  Plan Of Care/Follow-up recommendations:  Activity:  as toerated Diet:  regular Follow up outpatient basis Is patient on multiple antipsychotic therapies at discharge:  No   Has Patient had three or more failed trials of antipsychotic monotherapy by history:  No  Recommended Plan for Multiple Antipsychotic Therapies: NA    [name] A 03/18/2014, 3:13 PM

## 2014-03-18 NOTE — [facility] Group Notes (Signed)
[facility] LCSW Group Therapy  03/18/2014 1:15pm   Type of Therapy: Group Therapy- Overcoming Obstacles  Participation Level: Minimal  Participation Quality:  Appropriate  Affect:  Flat   Cognitive: Alert and Oriented   Insight:  Limited   Engagement in Therapy: Developing/Improving and Engaged   Modes of Intervention: Clarification, Confrontation, Discussion, Education, Exploration, Limit-setting, Orientation, Problem-solving, Rapport Building, Dance movement psychotherapisteality Testing, Socialization and Support   Summary of Progress/Problems: Pt identified obstacles faced currently and processed barriers involved in overcoming these obstacles. Pt identified steps necessary for overcoming these obstacles and explored motivation (internal and external) for facing these difficulties head on. Pt further identified one area of concern in their lives and chose a goal to focus on for today. Pt was absent for a large portion of group as she was meeting with the doctor.  While present, Pt was reserved but answered questions when prompted.  Pt reports that she wants to change "getting my kids back" as she feels they were wrongly taken by DSS.  Pt had difficulty stating steps she would need to take in order to get her kids back with the exception of "proving that I am a good mom."  Pt's affect was flat and she appeared anxious AEB shaky hands and poor eye contact.    Therapeutic Modalities:   Cognitive Behavioral Therapy Solution Focused Therapy Motivational Interviewing Relapse Prevention Therapy  Chad CordialLauren Carter, LCSWA .03/18/2014 3:42 PM

## 2014-03-18 NOTE — Progress Notes (Signed)
Pt d/c. AVS gone over. rx given and explained. Pt denies si/hi/avh. Pt ambulatory, no physical signs of distress. All pt belongings returned.

## 2014-03-18 NOTE — [facility] Group Notes (Signed)
[facility] Note  03/18/2014 8:45 AM  Pt did not attend, sleeping in room.  Chad CordialLauren Carter, LCSWA 03/18/2014 10:18 AM'

## 2014-03-21 NOTE — Progress Notes (Signed)
Patient Discharge Instructions:  After Visit Summary (AVS):   Faxed to:  03/21/14 Discharge Summary Note:   Faxed to:  03/21/14 Psychiatric Admission Assessment Note:   Faxed to:  03/21/14 Suicide Risk Assessment - Discharge Assessment:   Faxed to:  03/21/14 Faxed/Sent to the Next Level Care provider:  03/21/14 [facility]ax[facility] to [facility] @ [phone]  Jerelene ReddenSheena E [place], 03/21/2014, 2:27 PM

## 2014-08-08 ENCOUNTER — Emergency Department (HOSPITAL_COMMUNITY)
Admission: EM | Admit: 2014-08-08 | Discharge: 2014-08-08 | Disposition: A | Discharge status: Home / Self Care | Payer: Self-pay | Attending: Emergency Medicine | Admitting: Emergency Medicine

## 2014-08-08 ENCOUNTER — Encounter (HOSPITAL_COMMUNITY): Payer: Self-pay | Admitting: Cardiology

## 2014-08-08 ENCOUNTER — Emergency Department (HOSPITAL_COMMUNITY): Payer: Medicaid Other

## 2014-08-08 DIAGNOSIS — J45909 Unspecified asthma, uncomplicated: Secondary | ICD-10-CM | POA: Insufficient documentation

## 2014-08-08 DIAGNOSIS — R10819 Abdominal tenderness, unspecified site: Secondary | ICD-10-CM

## 2014-08-08 DIAGNOSIS — R109 Unspecified abdominal pain: Secondary | ICD-10-CM

## 2014-08-08 DIAGNOSIS — Z79899 Other long term (current) drug therapy: Secondary | ICD-10-CM | POA: Insufficient documentation

## 2014-08-08 DIAGNOSIS — R112 Nausea with vomiting, unspecified: Secondary | ICD-10-CM

## 2014-08-08 DIAGNOSIS — F319 Bipolar disorder, unspecified: Secondary | ICD-10-CM | POA: Insufficient documentation

## 2014-08-08 DIAGNOSIS — Z3202 Encounter for pregnancy test, result negative: Secondary | ICD-10-CM | POA: Insufficient documentation

## 2014-08-08 DIAGNOSIS — R197 Diarrhea, unspecified: Secondary | ICD-10-CM

## 2014-08-08 DIAGNOSIS — N72 Inflammatory disease of cervix uteri: Secondary | ICD-10-CM | POA: Insufficient documentation

## 2014-08-08 DIAGNOSIS — F419 Anxiety disorder, unspecified: Secondary | ICD-10-CM | POA: Insufficient documentation

## 2014-08-08 DIAGNOSIS — Z720 Tobacco use: Secondary | ICD-10-CM | POA: Insufficient documentation

## 2014-08-08 LAB — LIPASE, BLOOD: LIPASE: 17 U/L (ref 11–59)

## 2014-08-08 LAB — COMPREHENSIVE METABOLIC PANEL
ALBUMIN: 3.8 g/dL (ref 3.5–5.2)
ALT: 20 U/L (ref 0–35)
ANION GAP: 17 — AB (ref 5–15)
AST: 16 U/L (ref 0–37)
Alkaline Phosphatase: 76 U/L (ref 39–117)
BUN: 7 mg/dL (ref 6–23)
CALCIUM: 9.2 mg/dL (ref 8.4–10.5)
CO2: 18 mEq/L — ABNORMAL LOW (ref 19–32)
CREATININE: 0.66 mg/dL (ref 0.50–1.10)
Chloride: 103 mEq/L (ref 96–112)
GFR calc non Af Amer: >90 mL/min (ref >90)
GLUCOSE: 112 mg/dL — AB (ref 70–99)
Potassium: 3.3 mEq/L — ABNORMAL LOW (ref 3.7–5.3)
Sodium: 138 mEq/L (ref 137–147)
TOTAL PROTEIN: 7.1 g/dL (ref 6.0–8.3)
Total Bilirubin: 0.3 mg/dL (ref 0.3–1.2)

## 2014-08-08 LAB — CBC WITH DIFFERENTIAL/PLATELET
BASOS PCT: 1 % (ref 0–1)
Basophils Absolute: 0.1 10*3/uL (ref 0.0–0.1)
EOS PCT: 2 % (ref 0–5)
Eosinophils Absolute: 0.2 10*3/uL (ref 0.0–0.7)
HCT: 40.2 % (ref 36.0–46.0)
HEMOGLOBIN: 14.1 g/dL (ref 12.0–15.0)
LYMPHS ABS: 2.4 10*3/uL (ref 0.7–4.0)
Lymphocytes Relative: 38 % (ref 12–46)
MCH: 33.2 pg (ref 26.0–34.0)
MCHC: 35.1 g/dL (ref 30.0–36.0)
MCV: 94.6 fL (ref 78.0–100.0)
MONOS PCT: 9 % (ref 3–12)
Monocytes Absolute: 0.5 10*3/uL (ref 0.1–1.0)
NEUTROS PCT: 50 % (ref 43–77)
Neutro Abs: 3.1 10*3/uL (ref 1.7–7.7)
Platelets: 190 10*3/uL (ref 150–400)
RBC: 4.25 MIL/uL (ref 3.87–5.11)
RDW: 13 % (ref 11.5–15.5)
WBC: 6.2 10*3/uL (ref 4.0–10.5)

## 2014-08-08 LAB — URINE MICROSCOPIC-ADD ON

## 2014-08-08 LAB — URINALYSIS, ROUTINE W REFLEX MICROSCOPIC
Bilirubin Urine: NEGATIVE
Glucose, UA: NEGATIVE mg/dL
Hgb urine dipstick: NEGATIVE
Ketones, ur: NEGATIVE mg/dL
NITRITE: NEGATIVE
PROTEIN: NEGATIVE mg/dL
SPECIFIC GRAVITY, URINE: 1.022 (ref 1.005–1.030)
UROBILINOGEN UA: 0.2 mg/dL (ref 0.0–1.0)
pH: 6 (ref 5.0–8.0)

## 2014-08-08 LAB — WET PREP, GENITAL
Clue Cells Wet Prep HPF POC: NONE SEEN
Trich, Wet Prep: NONE SEEN
YEAST WET PREP: NONE SEEN

## 2014-08-08 LAB — HIV ANTIBODY (ROUTINE TESTING W REFLEX): HIV 1&2 Ab, 4th Generation: NONREACTIVE

## 2014-08-08 LAB — POC URINE PREG, ED: PREG TEST UR: NEGATIVE

## 2014-08-08 LAB — RPR

## 2014-08-08 MED ORDER — CEFTRIAXONE SODIUM 250 MG IJ SOLR
250.0000 mg | Freq: Once | INTRAMUSCULAR | Status: AC
  Administered 2014-08-08: 250 mg via INTRAMUSCULAR
  Filled 2014-08-08: qty 250

## 2014-08-08 MED ORDER — AZITHROMYCIN 250 MG PO TABS
1000.0000 mg | ORAL_TABLET | Freq: Once | ORAL | Status: AC
  Administered 2014-08-08: 1000 mg via ORAL
  Filled 2014-08-08: qty 4

## 2014-08-08 MED ORDER — MORPHINE SULFATE 4 MG/ML IJ SOLN
4.0000 mg | Freq: Once | INTRAMUSCULAR | Status: AC
  Administered 2014-08-08: 4 mg via INTRAVENOUS
  Filled 2014-08-08: qty 1

## 2014-08-08 MED ORDER — SODIUM CHLORIDE 0.9 % IV BOLUS (SEPSIS)
1000.0000 mL | Freq: Once | INTRAVENOUS | Status: AC
  Administered 2014-08-08: 1000 mL via INTRAVENOUS

## 2014-08-08 MED ORDER — LIDOCAINE HCL (PF) 1 % IJ SOLN
5.0000 mL | Freq: Once | INTRAMUSCULAR | Status: AC
  Administered 2014-08-08: 5 mL

## 2014-08-08 MED ORDER — ONDANSETRON HCL 4 MG PO TABS: 4.0000 mg | ORAL_TABLET | Freq: Four times a day (QID) | ORAL | Status: DC

## 2014-08-08 MED ORDER — AZITHROMYCIN 1 G PO PACK
1.0000 g | PACK | Freq: Once | ORAL | Status: DC
Start: 2014-08-08 — End: 2014-08-08

## 2014-08-08 MED ORDER — ONDANSETRON 4 MG PO TBDP
4.0000 mg | ORAL_TABLET | Freq: Once | ORAL | Status: AC
  Administered 2014-08-08: 4 mg via ORAL
  Filled 2014-08-08: qty 1

## 2014-08-08 MED ORDER — LIDOCAINE HCL (PF) 1 % IJ SOLN
INTRAMUSCULAR | Status: AC
  Filled 2014-08-08: qty 5

## 2014-08-08 MED ORDER — ONDANSETRON HCL 4 MG/2ML IJ SOLN
4.0000 mg | Freq: Once | INTRAMUSCULAR | Status: AC
  Administered 2014-08-08: 4 mg via INTRAVENOUS
  Filled 2014-08-08: qty 2

## 2014-08-08 NOTE — ED Notes (Signed)
Pt reports that she is 2 months late for her period and has been having abd pain for a couple of weeks. 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 Pt reports that she has been sick on her stomach.

## 2014-08-08 NOTE — ED Provider Notes (Signed)
CSN: [id number]     Arrival date & time 08/08/14  0807 History   First MD Initiated Contact with Patient 08/08/14 [phone]     Chief Complaint  Patient presents with  . Abdominal Pain     (Consider location/radiation/quality/duration/timing/severity/associated sxs/prior Treatment) HPI  [name] is a 28 y.o. female with PMH of endometriosis, asthma, bipolar, anxiety, depression, cholecystectomy, tubal pregnancy with removal of right ovary, tubal ligation presenting with effusion abdominal pain for couple of weeks. Patient notes nausea and 3 episodes of emesis as well as diarrhea. Patient notes stools are loose but no blood or tarry stool. No hematemesis. Patient denies fevers, chills. Patient denies urinary complaints. No hematuria. She does endorse increase in vaginal discharge with foul odor. Patient has not seen an OB/GYN and over 2 years. Denies history of STDs. Patient concerned she may be pregnant. Last bowel movement yesterday. No back pain. No headaches.   Past Medical History  Diagnosis Date  . Asthma   . Bipolar 1 disorder   . Anxiety   . Endometriosis   . Mental disorder   . Depression    Past Surgical History  Procedure Laterality Date  . Cholecystectomy    . Abdominal surgery    . Tubal ligation     No family history on file. History  Substance Use Topics  . Smoking status: Current Every Day Smoker -- 0.25 packs/day for 13 years    Types: Cigarettes  . Smokeless tobacco: Not on file  . Alcohol Use: Yes     Comment: Drinks twice a month   OB History    No data available     Review of Systems  Constitutional: Negative for fever and chills.  HENT: Negative for congestion and rhinorrhea.   Eyes: Negative for visual disturbance.  Respiratory: Negative for cough and shortness of breath.   Cardiovascular: Negative for chest pain.  Gastrointestinal: Positive for nausea, vomiting, abdominal pain and diarrhea.  Genitourinary: Negative for dysuria and  hematuria.  Musculoskeletal: Negative for back pain.  Skin: Negative for rash.  Neurological: Negative for weakness and headaches.      Allergies  Review of patient's allergies indicates no known allergies.  Home Medications   Prior to Admission medications   Medication Sig Start Date End Date Taking? Authorizing Provider  albuterol (PROVENTIL HFA;VENTOLIN HFA) 108 (90 BASE) MCG/ACT inhaler Inhale 1-2 puffs into the lungs every 6 (six) hours as needed for wheezing or shortness of breath.   Yes Historical Provider, MD  aspirin-acetaminophen-caffeine (EXCEDRIN MIGRAINE) [phone] MG per tablet Take 1 tablet by mouth every 6 (six) hours as needed for headache.   Yes Historical Provider, MD  clonazePAM (KLONOPIN) 1 MG tablet Take 1 tablet (1 mg total) by mouth 2 (two) times daily as needed (anxiety). Patient not taking: Reported on 08/08/2014 03/18/14   Sanjuana Kava, NP  divalproex (DEPAKOTE ER) 500 MG 24 hr tablet Take 1 tablet (500 mg total) by mouth at bedtime. For mood stabilization Patient not taking: Reported on 08/08/2014 03/18/14   Sanjuana Kava, NP  traZODone (DESYREL) 100 MG tablet Take 1 tablet (100 mg total) by mouth at bedtime and may repeat dose one time if needed. For sleep Patient not taking: Reported on 08/08/2014 03/18/14   Sanjuana Kava, NP  venlafaxine XR (EFFEXOR-XR) 75 MG 24 hr capsule Take 1 capsule (75 mg total) by mouth daily with breakfast. For sleep Patient not taking: Reported on 08/08/2014 03/18/14   Sanjuana Kava, NP  BP 109/49 mmHg  Pulse 72  Temp(Src) 97.6 F (36.4 C) (Oral)  Resp 14  SpO2 100%  LMP 07/26/2014 Physical Exam  Constitutional: She appears well-developed and well-nourished. No distress.  HENT:  Head: Normocephalic and atraumatic.  Eyes: Conjunctivae and EOM are normal. Right eye exhibits no discharge. Left eye exhibits no discharge.  Cardiovascular: Normal rate, regular rhythm and normal heart sounds.   Pulmonary/Chest: Effort normal and  breath sounds normal. No respiratory distress. She has no wheezes.  Abdominal: Soft. Bowel sounds are normal. She exhibits no distension.  Diffuse abdominal pain worse in left upper and lower quadrants without rebound, rigidity, guarding. No CVA tenderness or back tenderness.  Genitourinary:  Cervix pink without lesions, it is erythematous and mildly friable. Os closed. No CMT mild left adnexal tenderness. Moderate white thin discharge without foul odor. Nursing tech in room for exam.   Neurological: She is alert. She exhibits normal muscle tone. Coordination normal.  Skin: Skin is warm and dry. She is not diaphoretic.  Nursing note and vitals reviewed.   ED Course  Procedures (including critical care time) Labs Review Labs Reviewed  WET PREP, GENITAL - Abnormal; Notable for the following:    WBC, Wet Prep HPF POC MODERATE (*)    All other components within normal limits  COMPREHENSIVE METABOLIC PANEL - Abnormal; Notable for the following:    Potassium 3.3 (*)    CO2 18 (*)    Glucose, Bld 112 (*)    Anion gap 17 (*)    All other components within normal limits  URINALYSIS, ROUTINE W REFLEX MICROSCOPIC - Abnormal; Notable for the following:    APPearance CLOUDY (*)    Leukocytes, UA MODERATE (*)    All other components within normal limits  URINE MICROSCOPIC-ADD ON - Abnormal; Notable for the following:    Squamous Epithelial / LPF MANY (*)    Bacteria, UA MANY (*)    All other components within normal limits  GC/CHLAMYDIA PROBE AMP  CBC WITH DIFFERENTIAL  LIPASE, BLOOD  RPR  HIV ANTIBODY (ROUTINE TESTING)  POC URINE PREG, ED    Imaging Review Dg Abd 2 Views  08/08/2014   CLINICAL DATA:  28 year old female with diffuse abdominal pain x1 month. Vomiting since yesterday. Negative urine pregnancy test. Initial encounter.  EXAM: ABDOMEN - 2 VIEW  COMPARISON:  02/08/2011 abdominal series.  FINDINGS: Lung bases appear stable. No pneumoperitoneum. Stable cholecystectomy clips. Non  obstructed bowel gas pattern. Abdominal and pelvic visceral contours remain normal. No osseous abnormality identified.  IMPRESSION: Non obstructed bowel gas pattern, no free air.   Electronically Signed   By: Augusto GambleLee  Hall M.D.   On: 08/08/2014 11:47     EKG Interpretation None      Meds given in ED:  Medications  sodium chloride 0.9 % bolus 1,000 mL (0 mLs Intravenous Stopped 08/08/14 1104)  ondansetron (ZOFRAN-ODT) disintegrating tablet 4 mg (4 mg Oral Given 08/08/14 0901)  morphine 4 MG/ML injection 4 mg (4 mg Intravenous Given 08/08/14 0901)  morphine 4 MG/ML injection 4 mg (4 mg Intravenous Given 08/08/14 1050)  ondansetron (ZOFRAN) injection 4 mg (4 mg Intravenous Given 08/08/14 1050)  cefTRIAXone (ROCEPHIN) injection 250 mg (250 mg Intramuscular Given 08/08/14 1220)  azithromycin (ZITHROMAX) tablet 1,000 mg (1,000 mg Oral Given 08/08/14 1224)  lidocaine (PF) (XYLOCAINE) 1 % injection 5 mL (5 mLs Other Given 08/08/14 1220)    Discharge Medication List as of 08/08/2014 11:56 AM    START taking these medications  Details  ondansetron (ZOFRAN) 4 MG tablet Take 1 tablet (4 mg total) by mouth every 6 (six) hours., Starting 08/08/2014, Until Discontinued, Print          MDM   Final diagnoses:  Abdominal tenderness  Abdominal pain in female  Nausea vomiting and diarrhea  Cervicitis   Pt with history of 2-3 weeks of abdominal pain with intermittent nausea. Few episodes of emesis but none in ED. Patient is nontoxic, nonseptic appearing, in no apparent distress.  Patient's pain and other symptoms adequately managed in emergency department.  Fluid bolus given.  Labs, imaging and vitals reviewed.  Patient does not meet the SIRS or Sepsis criteria.  On repeat exam patient does not have a surgical abdomen and there are no peritoneal signs.  No indication of appendicitis, bowel obstruction, bowel perforation, diverticulitis, or ectopic pregnancy.  Pt with cervicitis on exam without CMT,  with mild left adnexal tenderness and moderate WBC with wet prep. I doubt PID. STD testing pending. Pt tx'd with medications above. Pt not to have intercourse until partners evaluated and treated. Patient discharged home with symptomatic treatment and given strict instructions for follow-up with their primary care physician.  I have also discussed reasons to return immediately to the ER.  Patient expresses understanding and agrees with plan. [facility]  Discussed return precautions with patient. Discussed all results and patient verbalizes understanding and agrees with plan.  Case has been discussed with Dr. Criss AlvineGoldston who agrees with the above plan and to discharge.     Louann SjogrenVictoria L Bellah Alia, PA-C 08/08/14 1854  Audree CamelScott T Goldston, MD 08/09/14 [phone]

## 2014-08-08 NOTE — ED Notes (Signed)
Provider at the bedside.  

## 2014-08-08 NOTE — Discharge Instructions (Signed)
Return to the emergency room with worsening of symptoms, new symptoms or with symptoms that are concerning, especially fevers, severe or worsening pain, unable to tolerate fluids, blood in stool or vomit.  Ibuprofen 400mg  (2 tablets 200mg ) every 5-6 hours for 3-5 days and then as needed for pain. zofran for nausea. [facility] to make [facility] with [facility] as soon as possible. Number provided above.   Abdominal Pain, Women Abdominal (stomach, pelvic, or belly) pain can be caused by many things. It is important to tell your doctor:  The location of the pain.  Does it come and go or is it present all the time?  Are there things that start the pain (eating certain foods, exercise)?  Are there other symptoms associated with the pain (fever, nausea, vomiting, diarrhea)? All of this is helpful to know when trying to find the cause of the pain. CAUSES   Stomach: virus or bacteria infection, or ulcer.  Intestine: appendicitis (inflamed appendix), regional ileitis (Crohn's disease), ulcerative colitis (inflamed colon), irritable bowel syndrome, diverticulitis (inflamed diverticulum of the colon), or cancer of the stomach or intestine.  Gallbladder disease or stones in the gallbladder.  Kidney disease, kidney stones, or infection.  Pancreas infection or cancer.  Fibromyalgia (pain disorder).  Diseases of the female organs:  Uterus: fibroid (non-cancerous) tumors or infection.  Fallopian tubes: infection or tubal pregnancy.  Ovary: cysts or tumors.  Pelvic adhesions (scar tissue).  Endometriosis (uterus lining tissue growing in the pelvis and on the pelvic organs).  Pelvic congestion syndrome (female organs filling up with blood just before the menstrual period).  Pain with the menstrual period.  Pain with ovulation (producing an egg).  Pain with an IUD (intrauterine device, birth control) in the uterus.  Cancer of the female organs.  Functional pain (pain not caused by  a disease, may improve without treatment).  Psychological pain.  Depression. DIAGNOSIS  Your doctor will decide the seriousness of your pain by doing an examination.  Blood tests.  X-rays.  Ultrasound.  CT scan (computed tomography, special type of X-ray).  MRI (magnetic resonance imaging).  Cultures, for infection.  Barium enema (dye inserted in the large intestine, to better view it with X-rays).  Colonoscopy (looking in intestine with a lighted tube).  Laparoscopy (minor surgery, looking in abdomen with a lighted tube).  Major abdominal exploratory surgery (looking in abdomen with a large incision). TREATMENT  The treatment will depend on the cause of the pain.   Many cases can be observed and treated at home.  Over-the-counter medicines recommended by your caregiver.  Prescription medicine.  Antibiotics, for infection.  Birth control pills, for painful periods or for ovulation pain.  Hormone treatment, for endometriosis.  Nerve blocking injections.  Physical therapy.  Antidepressants.  Counseling with a psychologist or psychiatrist.  Minor or major surgery. HOME CARE INSTRUCTIONS   Do not take laxatives, unless directed by your caregiver.  Take over-the-counter pain medicine only if ordered by your caregiver. Do not take aspirin because it can cause an upset stomach or bleeding.  Try a clear liquid diet (broth or water) as ordered by your caregiver. Slowly move to a bland diet, as tolerated, if the pain is related to the stomach or intestine.  Have a thermometer and take your temperature several times a day, and record it.  Bed rest and sleep, if it helps the pain.  Avoid sexual intercourse, if it causes pain.  Avoid stressful situations.  Keep your follow-up appointments and tests, as your  caregiver orders.  If the pain does not go away with medicine or surgery, you may try:  Acupuncture.  Relaxation exercises (yoga, meditation).  Group  therapy.  Counseling. SEEK MEDICAL CARE IF:   You notice certain foods cause stomach pain.  Your home care treatment is not helping your pain.  You need stronger pain medicine.  You want your IUD removed.  You feel faint or lightheaded.  You develop nausea and vomiting.  You develop a rash.  You are having side effects or an allergy to your medicine. SEEK IMMEDIATE MEDICAL CARE IF:   Your pain does not go away or gets worse.  You have a fever.  Your pain is felt only in portions of the abdomen. The right side could possibly be appendicitis. The left lower portion of the abdomen could be colitis or diverticulitis.  You are passing blood in your stools (bright red or black tarry stools, with or without vomiting).  You have blood in your urine.  You develop chills, with or without a fever.  You pass out. MAKE SURE YOU:   Understand these instructions.  Will watch your condition.  Will get help right away if you are not doing well or get worse. Document Released: 06/13/2007 Document Revised: 12/31/2013 Document Reviewed: 07/03/2009 [facility] Patient Information 2015 [place], [place]. This information is not intended to replace advice given to you by your health care provider. Make sure you discuss any questions you have with your health care provider.

## 2014-08-09 LAB — GC/CHLAMYDIA PROBE AMP
CT Probe RNA: NEGATIVE
GC Probe RNA: NEGATIVE

## 2014-09-23 ENCOUNTER — Encounter (HOSPITAL_COMMUNITY): Payer: Self-pay | Admitting: *Deleted

## 2014-09-23 ENCOUNTER — Emergency Department (HOSPITAL_COMMUNITY)
Admission: EM | Admit: 2014-09-23 | Discharge: 2014-09-23 | Disposition: A | Discharge status: Home / Self Care | Payer: Medicaid Other | Attending: Emergency Medicine | Admitting: Emergency Medicine

## 2014-09-23 ENCOUNTER — Emergency Department (HOSPITAL_COMMUNITY): Payer: Medicaid Other

## 2014-09-23 DIAGNOSIS — M545 Low back pain: Secondary | ICD-10-CM | POA: Insufficient documentation

## 2014-09-23 DIAGNOSIS — Z8742 Personal history of other diseases of the female genital tract: Secondary | ICD-10-CM | POA: Insufficient documentation

## 2014-09-23 DIAGNOSIS — J45901 Unspecified asthma with (acute) exacerbation: Secondary | ICD-10-CM | POA: Insufficient documentation

## 2014-09-23 DIAGNOSIS — Z3202 Encounter for pregnancy test, result negative: Secondary | ICD-10-CM | POA: Insufficient documentation

## 2014-09-23 DIAGNOSIS — Z79899 Other long term (current) drug therapy: Secondary | ICD-10-CM | POA: Insufficient documentation

## 2014-09-23 DIAGNOSIS — K2970 Gastritis, unspecified, without bleeding: Secondary | ICD-10-CM

## 2014-09-23 DIAGNOSIS — R0602 Shortness of breath: Secondary | ICD-10-CM

## 2014-09-23 DIAGNOSIS — Z720 Tobacco use: Secondary | ICD-10-CM | POA: Insufficient documentation

## 2014-09-23 DIAGNOSIS — F319 Bipolar disorder, unspecified: Secondary | ICD-10-CM | POA: Insufficient documentation

## 2014-09-23 DIAGNOSIS — F419 Anxiety disorder, unspecified: Secondary | ICD-10-CM | POA: Insufficient documentation

## 2014-09-23 LAB — CBC WITH DIFFERENTIAL/PLATELET
Basophils Absolute: 0 10*3/uL (ref 0.0–0.1)
Basophils Relative: 1 % (ref 0–1)
EOS PCT: 1 % (ref 0–5)
Eosinophils Absolute: 0 10*3/uL (ref 0.0–0.7)
HEMATOCRIT: 44.3 % (ref 36.0–46.0)
Hemoglobin: 15.8 g/dL — ABNORMAL HIGH (ref 12.0–15.0)
Lymphocytes Relative: 30 % (ref 12–46)
Lymphs Abs: 2.1 10*3/uL (ref 0.7–4.0)
MCH: 33.7 pg (ref 26.0–34.0)
MCHC: 35.7 g/dL (ref 30.0–36.0)
MCV: 94.5 fL (ref 78.0–100.0)
MONO ABS: 0.4 10*3/uL (ref 0.1–1.0)
Monocytes Relative: 5 % (ref 3–12)
NEUTROS PCT: 63 % (ref 43–77)
Neutro Abs: 4.4 10*3/uL (ref 1.7–7.7)
PLATELETS: 196 10*3/uL (ref 150–400)
RBC: 4.69 MIL/uL (ref 3.87–5.11)
RDW: 12.5 % (ref 11.5–15.5)
WBC: 6.9 10*3/uL (ref 4.0–10.5)

## 2014-09-23 LAB — COMPREHENSIVE METABOLIC PANEL
ALT: 21 U/L (ref 0–35)
ANION GAP: 11 (ref 5–15)
AST: 22 U/L (ref 0–37)
Albumin: 4.3 g/dL (ref 3.5–5.2)
Alkaline Phosphatase: 87 U/L (ref 39–117)
BUN: 6 mg/dL (ref 6–23)
CALCIUM: 9.5 mg/dL (ref 8.4–10.5)
CO2: 21 mmol/L (ref 19–32)
CREATININE: 0.84 mg/dL (ref 0.50–1.10)
Chloride: 106 mmol/L (ref 96–112)
GFR calc Af Amer: >90 mL/min (ref >90)
GFR calc non Af Amer: >90 mL/min (ref >90)
GLUCOSE: 99 mg/dL (ref 70–99)
Potassium: 3.4 mmol/L — ABNORMAL LOW (ref 3.5–5.1)
Sodium: 138 mmol/L (ref 135–145)
Total Bilirubin: 0.9 mg/dL (ref 0.3–1.2)
Total Protein: 7.5 g/dL (ref 6.0–8.3)

## 2014-09-23 LAB — URINALYSIS, ROUTINE W REFLEX MICROSCOPIC
GLUCOSE, UA: NEGATIVE mg/dL
Hgb urine dipstick: NEGATIVE
Ketones, ur: 15 mg/dL — AB
Nitrite: NEGATIVE
Protein, ur: 30 mg/dL — AB
SPECIFIC GRAVITY, URINE: 1.03 (ref 1.005–1.030)
Urobilinogen, UA: 0.2 mg/dL (ref 0.0–1.0)
pH: 5.5 (ref 5.0–8.0)

## 2014-09-23 LAB — POC URINE PREG, ED: Preg Test, Ur: NEGATIVE

## 2014-09-23 LAB — I-STAT TROPONIN, ED: Troponin i, poc: 0 ng/mL (ref 0.00–0.08)

## 2014-09-23 LAB — URINE MICROSCOPIC-ADD ON

## 2014-09-23 LAB — D-DIMER, QUANTITATIVE: D-Dimer, Quant: <0.27 ug/mL-FEU (ref 0.00–0.48)

## 2014-09-23 MED ORDER — ALBUTEROL SULFATE (2.5 MG/3ML) 0.083% IN NEBU
5.0000 mg | INHALATION_SOLUTION | Freq: Once | RESPIRATORY_TRACT | Status: AC
  Administered 2014-09-23: 5 mg via RESPIRATORY_TRACT
  Filled 2014-09-23: qty 6

## 2014-09-23 MED ORDER — GI COCKTAIL ~~LOC~~
30.0000 mL | Freq: Once | ORAL | Status: AC
  Administered 2014-09-23: 30 mL via ORAL
  Filled 2014-09-23: qty 30

## 2014-09-23 MED ORDER — KETOROLAC TROMETHAMINE 60 MG/2ML IM SOLN
60.0000 mg | Freq: Once | INTRAMUSCULAR | Status: AC
  Administered 2014-09-23: 60 mg via INTRAMUSCULAR
  Filled 2014-09-23: qty 2

## 2014-09-23 MED ORDER — RANITIDINE HCL 150 MG PO CAPS: 150.0000 mg | ORAL_CAPSULE | Freq: Every day | ORAL | Status: DC

## 2014-09-23 MED ORDER — PREDNISONE 10 MG PO TABS: 20.0000 mg | ORAL_TABLET | Freq: Every day | ORAL | Status: DC

## 2014-09-23 NOTE — Discharge Instructions (Signed)
Asthma Asthma is a condition of the lungs in which the airways tighten and narrow. Asthma can make it hard to breathe. Asthma cannot be cured, but medicine and lifestyle changes can help control it. Asthma may be started (triggered) by:  Animal skin flakes (dander).  Dust.  Cockroaches.  Pollen.  Mold.  Smoke.  Cleaning products.  Hair sprays or aerosol sprays.  Paint fumes or strong smells.  Cold air, weather changes, and winds.  Crying or laughing hard.  Stress.  Certain medicines or drugs.  Foods, such as dried fruit, potato chips, and sparkling grape juice.  Infections or conditions (colds, flu).  Exercise.  Certain medical conditions or diseases.  Exercise or tiring activities. HOME CARE   Take medicine as told by your doctor.  Use a peak flow meter as told by your doctor. A peak flow meter is a tool that measures how well the lungs are working.  Record and keep track of the peak flow meter's readings.  Understand and use the asthma action plan. An asthma action plan is a written plan for taking care of your asthma and treating your attacks.  To help prevent asthma attacks:  Do not smoke. Stay away from secondhand smoke.  Change your heating and air conditioning filter often.  Limit your use of fireplaces and wood stoves.  Get rid of pests (such as roaches and mice) and their droppings.  Throw away plants if you see mold on them.  Clean your floors. Dust regularly. Use cleaning products that do not smell.  Have someone vacuum when you are not home. Use a vacuum cleaner with a HEPA filter if possible.  Replace carpet with wood, tile, or vinyl flooring. Carpet can trap animal skin flakes and dust.  Use allergy-proof pillows, mattress covers, and box spring covers.  Wash bed sheets and blankets every week in hot water and dry them in a dryer.  Use blankets that are made of polyester or cotton.  Clean bathrooms and kitchens with bleach. If  possible, have someone repaint the walls in these rooms with mold-resistant paint. Keep out of the rooms that are being cleaned and painted.  Wash hands often. GET HELP IF:  You have make a whistling sound when breaking (wheeze), have shortness of breath, or have a cough even if taking medicine to prevent attacks.  The colored mucus you cough up (sputum) is thicker than usual.  The colored mucus you cough up changes from clear or white to yellow, green, gray, or bloody.  You have problems from the medicine you are taking such as:  A rash.  Itching.  Swelling.  Trouble breathing.  You need reliever medicines more than 2-3 times a week.  Your peak flow measurement is still at 50-79% of your personal best after following the action plan for 1 hour.  You have a fever. GET HELP RIGHT AWAY IF:   You seem to be worse and are not responding to medicine during an asthma attack.  You are short of breath even at rest.  You get short of breath when doing very little activity.  You have trouble eating, drinking, or talking.  You have chest pain.  You have a fast heartbeat.  Your lips or fingernails start to turn blue.  You are light-headed, dizzy, or faint.  Your peak flow is less than 50% of your personal best. MAKE SURE YOU:   Understand these instructions.  Will watch your condition.  Will get help right away if you  are not doing well or get worse. Document Released: 02/02/2008 Document Revised: 12/31/2013 Document Reviewed: 03/15/2013 [facility] Patient Information 2015 [place], [place]. This information is not intended to replace advice given to you by your health care provider. Make sure you discuss any questions you have with your health care provider.  Gastritis, Adult Gastritis is soreness and swelling (inflammation) of the lining of the stomach. Gastritis can develop as a sudden onset (acute) or long-term (chronic) condition. If gastritis is not treated, it can lead  to stomach bleeding and ulcers. CAUSES  Gastritis occurs when the stomach lining is weak or damaged. Digestive juices from the stomach then inflame the weakened stomach lining. The stomach lining may be weak or damaged due to viral or bacterial infections. One common bacterial infection is the Helicobacter pylori infection. Gastritis can also result from excessive alcohol consumption, taking certain medicines, or having too much acid in the stomach.  SYMPTOMS  In some cases, there are no symptoms. When symptoms are present, they may include:  Pain or a burning sensation in the upper abdomen.  Nausea.  Vomiting.  An uncomfortable feeling of fullness after eating. DIAGNOSIS  Your caregiver may suspect you have gastritis based on your symptoms and a physical exam. To determine the cause of your gastritis, your caregiver may perform the following:  Blood or stool tests to check for the H pylori bacterium.  Gastroscopy. A thin, flexible tube (endoscope) is passed down the esophagus and into the stomach. The endoscope has a light and camera on the end. Your caregiver uses the endoscope to view the inside of the stomach.  Taking a tissue sample (biopsy) from the stomach to examine under a microscope. TREATMENT  Depending on the cause of your gastritis, medicines may be prescribed. If you have a bacterial infection, such as an H pylori infection, antibiotics may be given. If your gastritis is caused by too much acid in the stomach, H2 blockers or antacids may be given. Your caregiver may recommend that you stop taking aspirin, ibuprofen, or other nonsteroidal anti-inflammatory drugs (NSAIDs). HOME CARE INSTRUCTIONS  Only take over-the-counter or prescription medicines as directed by your caregiver.  If you were given antibiotic medicines, take them as directed. Finish them even if you start to feel better.  Drink enough fluids to keep your urine clear or pale yellow.  Avoid foods and drinks  that make your symptoms worse, such as:  Caffeine or alcoholic drinks.  Chocolate.  Peppermint or mint flavorings.  Garlic and onions.  Spicy foods.  Citrus fruits, such as oranges, lemons, or limes.  Tomato-based foods such as sauce, chili, salsa, and pizza.  Fried and fatty foods.  Eat small, frequent meals instead of large meals. SEEK IMMEDIATE MEDICAL CARE IF:   You have black or dark red stools.  You vomit blood or material that looks like coffee grounds.  You are unable to keep fluids down.  Your abdominal pain gets worse.  You have a fever.  You do not feel better after 1 week.  You have any other questions or concerns. MAKE SURE YOU:  Understand these instructions.  Will watch your condition.  Will get help right away if you are not doing well or get worse. Document Released: 08/10/2001 Document Revised: 02/15/2012 Document Reviewed: 09/29/2011 [facility] Patient Information 2015 [place]Care, [place]LLC. This information is not intended to replace advice given to you by your health care provider. Make sure you discuss any questions you have with your health care provider. Emergency Department  Resource Guide 1) Find a Librarian, academic and Pay Out of Pocket Although you won't have to find out who is covered by your insurance plan, it is a good idea to ask around and get recommendations. You will then need to call the office and see if the doctor you have chosen will accept you as a new patient and what types of options they offer for patients who are self-pay. Some doctors offer discounts or will set up payment plans for their patients who do not have insurance, but you will need to ask so you aren't surprised when you get to your appointment.  2) Contact Your Local Health Department Not all health departments have doctors that can see patients for sick visits, but many do, so it is worth a call to see if yours does. If you don't know where your local health department is, you can  check in your phone book. The CDC also has a tool to help you locate your state's health department, and many state websites also have listings of all of their local health departments.  3) Find a Walk-in Clinic If your illness is not likely to be very severe or complicated, you may want to try a walk in clinic. These are popping up all over the country in pharmacies, drugstores, and shopping centers. They're usually staffed by nurse practitioners or physician assistants that have been trained to treat common illnesses and complaints. They're usually fairly quick and inexpensive. However, if you have serious medical issues or chronic medical problems, these are probably not your best option.  No Primary Care Doctor: - Call Health Connect at  [phone] - they can help you locate a primary care doctor that  accepts your insurance, provides certain services, etc. - Physician Referral Service- [phone]  Chronic Pain Problems: Organization         Address     Phone             Notes  [facility]  [phone] Patients need to be referred by their primary care doctor.   Medication Assistance: Organization         Address     Phone             Notes  [facility] Medication [facility] [address] [place] [place], [place] [address] [phone] --Must be a resident of [facility] -- Must have NO insurance coverage whatsoever (no Medicaid/ Medicare, etc.) -- The pt. MUST have a primary care doctor that directs their care regularly and follows them in the community   [facility]ist  [phone]   [employer]  [phone]    Agencies that provide inexpensive medical care: Organization         Address     Phone             Notes  [facility]  [phone]   [facility]    [phone]   [facility] [address] [place], [place] [address] [phone]   [facility] [address] [place]  [address], [place] [phone]   Planned Parenthood    [phone]   [facility]    [phone]   [facility] and [facility]  [address], [place] Phone:  [phone], Fax:  [phone] Hours of Operation:  9 am - 6 pm, M-F.  Also accepts Medicaid/Medicare and self-pay.  [facility]  [address], [place] Phone: [phone], Fax: [phone]. Hours of Operation:  8:30 am - 5:30 pm, M-F.  Also accepts Medicaid and self-pay.  [facility] [place] [address], [place] Phone: [phone]   [facility]     [address] Natasha Bence [place], [place] [phone], Ext. [phone] Mondays & Thursdays: 7-9 AM.  First 15 patients are seen on a first come, first serve basis.   [facility] [address] [place] [address] [phone] Accepts Medicaid   Medicaid-accepting [facility] Providers:  Organization         Address     Phone             Notes  [facility] [place] [address],  [phone] Also accepts self-pay patients.  [facility] [address] Laurell Josephs [place], [place]  [phone]   [facility] [address], [place] [phone]   [facility] [address] [place] [phone]   Renaye Rakers [address], [place]   [phone] Only accepts [place] Access [place] patients after they have their name applied to their card.   Self-Pay (no insurance) in [facility]:  Organization         Address     Phone             Notes  Sickle Cell Patients, [facility] [address] [place], [place] [phone]   [facility] [address] [place], [place] [phone]   [facility] [place]  [address], [place] [phone]   [facility]/Dr. Osei-Bonsu  [address], [place] or [address], [place] [phone] Phone number for both [place] and [place] locations is the same.  [facility]gent Medical and [facility] [address], [place] [phone]   [facility] [address] [place] or [address] [phone]   [facility] [address], [place] [phone], phone; [phone], fax Sees patients 1st and 3rd Saturday of every month.  Must not qualify for public or private insurance (i.e. Medicaid, Medicare, [place] Health Choice, Veterans' Benefits)  Household income should be no more than 200% of the poverty level The clinic cannot treat you if you are pregnant or think you are pregnant  Sexually transmitted diseases are not treated at the clinic.    Dental Care:  Organization         Address     Phone             Notes  [facility] Department of [facility] [address] [place], [place] [phone] Accepts children up to age 39 who are enrolled in [place] or [place] Health Choice; pregnant women with a Medicaid card; and children who have applied for Medicaid or [place] Health Choice, but were declined, whose parents can pay a reduced fee at time of service.  [facility] Department of [facility]  [address], [place] [phone] Accepts children up to age 28 who are enrolled in [place] or [place] Health Choice; pregnant women with a Medicaid card; and children who have applied for Medicaid or [place] Health  Choice, but were declined, whose parents can pay a reduced fee at time of service.  [facility]  [address] [place], [place] [phone] Patients are seen by appointment only. Walk-ins are not accepted. [facility] will see patients 1 years of age and older. Monday - Tuesday (8am-5pm) Most Wednesdays (8:30-5pm) $30 per visit,  cash only  [facility]  [address], [facility] [phone] Patients are seen by appointment only. Walk-ins are not accepted. [facility] will see patients 63 years of age and older. One Wednesday Evening (Monthly: Volunteer Based).  $30 per visit, cash only  [employer]  [phone] for adults; Children under age 67, call Graduate Pediatric Dentistry at [phone]. Children aged 64-14, please call [phone] to request a pediatric application.  Dental services are provided in all areas of dental care including fillings, crowns and bridges, complete and partial dentures, implants, gum treatment, root canals, and extractions. Preventive care is also provided. Treatment is provided to both adults and children. Patients are selected via a lottery and there is often a waiting list.   [facility] [address] [place]  [phone] [email or web address]   [facility] [address] [place], [place] [phone], Ext. [phone] Second and Fourth Thursday of each month, opens at 6:30 AM; Clinic ends at 9 AM.  Patients are seen on a first-come first-served basis, and a limited number are seen during each clinic.   [facility]  [address] Ether Griffins [place], [place] [phone]   Eligibility Requirements You must have lived in [place], [place], or [place] counties for at least the last three months.   You cannot be eligible for state or federal sponsored [place], including [employer], [place], or [employer].   You generally cannot be eligible for healthcare insurance through your employer.    How to apply: Eligibility screenings are held every Tuesday and Wednesday afternoon from 1:00 pm until 4:00 pm. You do not need an appointment for the interview!  [facility] [address] [place], [place] [phone]   [facility] Health Department   [phone]   [facility] Health Department  [phone]   [facility] Health Department  [phone]    Behavioral Health Resources in the Community: Intensive Outpatient Programs Organization         Address     Phone             Notes  [facility] Services [address], [place], [place] [phone]   [facility] Outpatient [address] [place], [place] [phone]   [facility] [address], [place], [place]  [phone]   [facility] Mental Health [address],  [place], [place] [phone] or [phone]     Substance Abuse Resources Organization         Address     Phone             Notes  [facility] Drug [facility]  [phone]   [facility]  [phone]   [facility] [place]  [phone]   Floydene Flock  [phone]   Residential & Outpatient Substance Abuse Program  [phone]   Psychological Services Organization         Address     Phone             Notes  [facility] Behavioral Health  [phone]   [employer]   [facility] Mental Health [address], [place] [phone] or [phone]    Mobile Crisis Teams Organization         Address     Phone             Notes  [facility], Mobile Crisis Care Unit  [phone]   [facility]  [address] [place], [place] [phone]   Doristine Locks [address] [place] [phone]    Self-Help/Support Groups Organization         Address     Phone             Notes  [facility] [place] - variety of support groups  336- I7437963 Call for more information  Narcotics Anonymous (NA), [facility] [address], [employer] [place]  2 meetings at this location   Statistician         Address     Phone             Notes  [facility] [address],    [place]  [phone]   [facility]  [address] [place] [address], [place], [place] [phone]   [facility] [address] [place], [place] [phone] Admissions: 8am-3pm M-F  [facility] [address],    [place], [place] [phone]   [facility] [address] [place], [place], [place] [phone]   [facility] [address]  [place], [place] [phone]   [facility] - Intensive Outpatient [address] Laurell Josephs [address], [place], [place] [phone]   [facility] ([facility]iction [facility] [address] [place]  [place], [place] [phone] or [phone]   [facility] ([facility]) [address], [place], [place] [phone] Accepts Medicaid  [facility] [address]  [place] [phone] Substance Abuse/Addiction Treatment   [facility] Organization         Address     Phone             Notes  [facility]  [phone]   Angie Fava, PhD [address] Ervin Knack [place], [place]   [phone] or [phone]   [facility]   [address] [place], [place] [phone]   [facility] [address] [place], [place] [phone] Insurance/Medicaid/sponsorship through [facility] Families [address]                                    [place], [place] [phone] Therapy/tele-psych/case  [facility] [address], [place] [phone]    Dr. Lolly Mustache  [phone]   [facility] [place]  [employer] [facility] Dept. 1) [address] [place] 2) [address], [place] 3)  [address], [place] [phone]  [phone]   [facility] Child Abuse Hotline 250-122-4456 or [phone] (After Hours)

## 2014-09-23 NOTE — ED Provider Notes (Signed)
CSN: [id number]     Arrival date & time 09/23/14  1258 History   First MD Initiated Contact with Patient 09/23/14 1622     Chief Complaint  Patient presents with  . Shortness of Breath  . Abdominal Pain     (Consider location/radiation/quality/duration/timing/severity/associated sxs/prior Treatment) HPI Comments: Patient presents with multiple complaints. She states she's had a cough and does like she is gasping for air at times. She does have a history of asthma and has been using her inhaler more frequently over the last few days. She feels like she's been wheezing more. She denies he fevers or chills. She denies any chest pain. She also has some lower back pain. It's pain across her low back. There's no radiation down her legs. She denies any numbness or weakness in her legs. She denies any recent injuries to her back. She's had prior back pain from time to time. She's been using Excedrin fairly frequently for her back pain with some improvement of symptoms. She also complains of pain in her abdomen. She has pain in her epigastrium that it radiates across her abdomen. Not related to eating. She denies any nausea vomiting or diarrhea. She denies any fevers or chills. She denies any urinary symptoms.  Patient is a 29 y.o. female presenting with shortness of breath and abdominal pain.  Shortness of Breath Associated symptoms: abdominal pain, cough and wheezing   Associated symptoms: no chest pain, no diaphoresis, no fever, no headaches, no rash and no vomiting   Abdominal Pain Associated symptoms: cough, fatigue and shortness of breath   Associated symptoms: no chest pain, no chills, no diarrhea, no fever, no hematuria, no nausea, no vaginal bleeding, no vaginal discharge and no vomiting     Past Medical History  Diagnosis Date  . Asthma   . Bipolar 1 disorder   . Anxiety   . Endometriosis   . Mental disorder   . Depression    Past Surgical History  Procedure Laterality Date  .  Cholecystectomy    . Abdominal surgery    . Tubal ligation     History reviewed. No pertinent family history. History  Substance Use Topics  . Smoking status: Current Every Day Smoker -- 0.25 packs/day for 13 years    Types: Cigarettes  . Smokeless tobacco: Not on file  . Alcohol Use: Yes     Comment: Drinks twice a month   OB History    No data available     Review of Systems  Constitutional: Positive for fatigue. Negative for fever, chills and diaphoresis.  HENT: Negative for congestion, rhinorrhea and sneezing.   Eyes: Negative.   Respiratory: Positive for cough, shortness of breath and wheezing. Negative for chest tightness.   Cardiovascular: Negative for chest pain and leg swelling.  Gastrointestinal: Positive for abdominal pain. Negative for nausea, vomiting, diarrhea and blood in stool.  Genitourinary: Negative for frequency, hematuria, flank pain, vaginal bleeding, vaginal discharge and difficulty urinating.  Musculoskeletal: Negative for back pain and arthralgias.  Skin: Negative for rash.  Neurological: Negative for dizziness, speech difficulty, weakness, numbness and headaches.      Allergies  Coconut flavor  Home Medications   Prior to Admission medications   Medication Sig Start Date End Date Taking? Authorizing Provider  albuterol (PROVENTIL HFA;VENTOLIN HFA) 108 (90 BASE) MCG/ACT inhaler Inhale 1-2 puffs into the lungs every 6 (six) hours as needed for wheezing or shortness of breath.   Yes Historical Provider, MD  aspirin-acetaminophen-caffeine [facility] MIGRAINE) [phone]  MG per tablet Take 1 tablet by mouth every 6 (six) hours as needed for headache.   Yes Historical Provider, MD  clonazePAM (KLONOPIN) 1 MG tablet Take 1 tablet (1 mg total) by mouth 2 (two) times daily as needed (anxiety). Patient not taking: Reported on 08/08/2014 03/18/14   Sanjuana Kava, NP  divalproex (DEPAKOTE ER) 500 MG 24 hr tablet Take 1 tablet (500 mg total) by mouth at bedtime.  For mood stabilization Patient not taking: Reported on 08/08/2014 03/18/14   Sanjuana Kava, NP  ondansetron (ZOFRAN) 4 MG tablet Take 1 tablet (4 mg total) by mouth every 6 (six) hours. Patient not taking: Reported on 09/23/2014 08/08/14   Louann Sjogren, PA-C  predniSONE (DELTASONE) 10 MG tablet Take 2 tablets (20 mg total) by mouth daily. 09/23/14   Rolan Bucco, MD  ranitidine (ZANTAC) 150 MG capsule Take 1 capsule (150 mg total) by mouth daily. 09/23/14   Rolan Bucco, MD  traZODone (DESYREL) 100 MG tablet Take 1 tablet (100 mg total) by mouth at bedtime and may repeat dose one time if needed. For sleep Patient not taking: Reported on 08/08/2014 03/18/14   Sanjuana Kava, NP  venlafaxine XR (EFFEXOR-XR) 75 MG 24 hr capsule Take 1 capsule (75 mg total) by mouth daily with breakfast. For sleep Patient not taking: Reported on 08/08/2014 03/18/14   Sanjuana Kava, NP   BP 114/67 mmHg  Pulse 65  Temp(Src) 97.4 F (36.3 C) (Oral)  Resp 18  SpO2 97%  LMP 09/12/2014 Physical Exam  Constitutional: She is oriented to person, place, and time. She appears well-developed and well-nourished.  HENT:  Head: Normocephalic and atraumatic.  Mouth/Throat: Oropharynx is clear and moist.  Eyes: Pupils are equal, round, and reactive to light.  Neck: Normal range of motion. Neck supple.  Cardiovascular: Normal rate, regular rhythm and normal heart sounds.   Pulmonary/Chest: Effort normal and breath sounds normal. No respiratory distress. She has no wheezes. She has no rales. She exhibits no tenderness.  Abdominal: Soft. Bowel sounds are normal. There is tenderness (mild tenderness to the epigastrium). There is no rebound and no guarding.  Musculoskeletal: Normal range of motion. She exhibits no edema.  Tenderness across the musculature of the lumbar spine bilaterally. There is no pain along the spine. Negative straight leg raise bilaterally. Patient has normal motor function and sensation in the lower  extremities.  Lymphadenopathy:    She has no cervical adenopathy.  Neurological: She is alert and oriented to person, place, and time.  Skin: Skin is warm and dry. No rash noted.  Psychiatric: She has a normal mood and affect.    ED Course  Procedures (including critical care time) Labs Review Labs Reviewed  CBC WITH DIFFERENTIAL/PLATELET - Abnormal; Notable for the following:    Hemoglobin 15.8 (*)    All other components within normal limits  COMPREHENSIVE METABOLIC PANEL - Abnormal; Notable for the following:    Potassium 3.4 (*)    All other components within normal limits  URINALYSIS, ROUTINE W REFLEX MICROSCOPIC - Abnormal; Notable for the following:    Color, Urine AMBER (*)    Bilirubin Urine SMALL (*)    Ketones, ur 15 (*)    Protein, ur 30 (*)    Leukocytes, UA TRACE (*)    All other components within normal limits  URINE MICROSCOPIC-ADD ON - Abnormal; Notable for the following:    Squamous Epithelial / LPF FEW (*)    Bacteria, UA MANY (*)  All other components within normal limits  URINE CULTURE  D-DIMER, QUANTITATIVE  POC URINE PREG, ED  I-STAT TROPOININ, ED    Imaging Review Dg Chest 2 View  09/23/2014   CLINICAL DATA:  Shortness of breath for 3 days, history of asthma  EXAM: CHEST  2 VIEW  COMPARISON:  09/12/2011  FINDINGS: The heart size and mediastinal contours are within normal limits. Both lungs are clear. The visualized skeletal structures are unremarkable.  IMPRESSION: No active cardiopulmonary disease.   Electronically Signed   By: Alcide CleverMark  Lukens M.D.   On: 09/23/2014 15:03     EKG Interpretation   Date/Time:  Monday September 23 2014 13:05:30 EST Ventricular Rate:  85 PR Interval:  126 QRS Duration: 70 QT Interval:  380 QTC Calculation: 452 R Axis:   -3 Text Interpretation:  Normal sinus rhythm with sinus arrhythmia Moderate  voltage criteria for LVH, may be normal variant Borderline ECG since last  tracing no significant change Confirmed by  Aniylah Avans  MD, Ellora Varnum ([id number]) on  09/23/2014 11:23:45 PM      MDM   Final diagnoses:  Gastritis  Asthma exacerbation    Patient has multiple complaints. As far as her cough and shortness of breath she has no evidence of pneumonia. Her d-dimer is negative and she has no other suggestions of pulmonary embolus. I will start her on a prednisone pack and advised her to continue using her inhaler more frequently for the next few days every 4-6 hours and then go back to using it PRN she after that.  Her abdominal pain sounds more like a gastritis. Her pain improved after GI cocktail. Her labs are unremarkable and she has no pain over her gallbladder. She has no vomiting or fevers. I will start her on Zantac.  Her back pain seems musculoskeletal in nature. She has no neurologic deficits or signs of cauda equina. Her urine does not appear to be infected but it was sent for culture. She doesn't have any urinary signs of infection this time. She was encouraged to have close follow-up a primary care physician. She was given resources for outpatient follow-up.    Rolan BuccoMelanie Lambert Jeanty, MD 09/23/14 2329

## 2014-09-23 NOTE — ED Notes (Signed)
Pt reports abd pain x2 days. Denies n/v/d. Feels like "she is gasping for air." having a productive cough. Airway intact at triage.

## 2014-09-25 LAB — URINE CULTURE
COLONY COUNT: NO GROWTH
CULTURE: NO GROWTH

## 2014-12-11 ENCOUNTER — Emergency Department (HOSPITAL_COMMUNITY): Payer: Medicaid Other

## 2014-12-11 ENCOUNTER — Emergency Department (HOSPITAL_COMMUNITY)
Admission: EM | Admit: 2014-12-11 | Discharge: 2014-12-11 | Disposition: A | Discharge status: Home / Self Care | Payer: Medicaid Other | Attending: Emergency Medicine | Admitting: Emergency Medicine

## 2014-12-11 ENCOUNTER — Encounter (HOSPITAL_COMMUNITY): Payer: Self-pay | Admitting: Family Medicine

## 2014-12-11 DIAGNOSIS — B9689 Other specified bacterial agents as the cause of diseases classified elsewhere: Secondary | ICD-10-CM

## 2014-12-11 DIAGNOSIS — J45909 Unspecified asthma, uncomplicated: Secondary | ICD-10-CM | POA: Insufficient documentation

## 2014-12-11 DIAGNOSIS — N760 Acute vaginitis: Secondary | ICD-10-CM | POA: Insufficient documentation

## 2014-12-11 DIAGNOSIS — Z3202 Encounter for pregnancy test, result negative: Secondary | ICD-10-CM | POA: Insufficient documentation

## 2014-12-11 DIAGNOSIS — F419 Anxiety disorder, unspecified: Secondary | ICD-10-CM | POA: Insufficient documentation

## 2014-12-11 DIAGNOSIS — Z720 Tobacco use: Secondary | ICD-10-CM | POA: Insufficient documentation

## 2014-12-11 DIAGNOSIS — F319 Bipolar disorder, unspecified: Secondary | ICD-10-CM | POA: Insufficient documentation

## 2014-12-11 DIAGNOSIS — Z79899 Other long term (current) drug therapy: Secondary | ICD-10-CM | POA: Insufficient documentation

## 2014-12-11 DIAGNOSIS — R1032 Left lower quadrant pain: Secondary | ICD-10-CM

## 2014-12-11 DIAGNOSIS — Z7982 Long term (current) use of aspirin: Secondary | ICD-10-CM | POA: Insufficient documentation

## 2014-12-11 DIAGNOSIS — Z7952 Long term (current) use of systemic steroids: Secondary | ICD-10-CM | POA: Insufficient documentation

## 2014-12-11 LAB — POC URINE PREG, ED: PREG TEST UR: NEGATIVE

## 2014-12-11 LAB — URINALYSIS, ROUTINE W REFLEX MICROSCOPIC
BILIRUBIN URINE: NEGATIVE
Glucose, UA: NEGATIVE mg/dL
HGB URINE DIPSTICK: NEGATIVE
Ketones, ur: NEGATIVE mg/dL
Leukocytes, UA: NEGATIVE
Nitrite: NEGATIVE
PROTEIN: NEGATIVE mg/dL
Specific Gravity, Urine: >1.03 — ABNORMAL HIGH (ref 1.005–1.030)
Urobilinogen, UA: 0.2 mg/dL (ref 0.0–1.0)
pH: 6 (ref 5.0–8.0)

## 2014-12-11 LAB — WET PREP, GENITAL
Trich, Wet Prep: NONE SEEN
WBC, Wet Prep HPF POC: NONE SEEN
YEAST WET PREP: NONE SEEN

## 2014-12-11 MED ORDER — HYDROCODONE-ACETAMINOPHEN 5-325 MG PO TABS
2.0000 | ORAL_TABLET | Freq: Once | ORAL | Status: AC
  Administered 2014-12-11: 2 via ORAL
  Filled 2014-12-11: qty 2

## 2014-12-11 MED ORDER — HYDROCODONE-ACETAMINOPHEN 5-325 MG PO TABS: 1.0000 | ORAL_TABLET | ORAL | Status: DC | PRN

## 2014-12-11 MED ORDER — NORGESTIMATE-ETH ESTRADIOL 0.25-35 MG-MCG PO TABS: 1.0000 | ORAL_TABLET | Freq: Every day | ORAL | Status: DC

## 2014-12-11 MED ORDER — METRONIDAZOLE 500 MG PO TABS: 500.0000 mg | ORAL_TABLET | Freq: Two times a day (BID) | ORAL | Status: DC

## 2014-12-11 MED ORDER — IBUPROFEN 800 MG PO TABS: 800.0000 mg | ORAL_TABLET | Freq: Three times a day (TID) | ORAL | Status: DC | PRN

## 2014-12-11 MED ORDER — IBUPROFEN 800 MG PO TABS
800.0000 mg | ORAL_TABLET | Freq: Once | ORAL | Status: AC
  Administered 2014-12-11: 800 mg via ORAL
  Filled 2014-12-11: qty 1

## 2014-12-11 NOTE — ED Provider Notes (Signed)
TIME SEEN: 11:10 AM  CHIEF COMPLAINT: Abdominal pain, lower back pain, foul odor to urine  HPI: Pt is a 29 y.o. female with history of asthma, bipolar disorder, endometriosis who presents to the emergency department with 2 years of lower back pain and one month of left-sided pelvic pain. Reports pain in her abdomen was worse today. States it feels similar to her prior endometriosis. She used to follow with [facility] but no longer has insurance. States her last menstrual period was 4/3 and lasted until 4/7. States that it was 10 days early and this is abnormal for her. Denies vaginal discharge. States she does have an odor with her urine but no dysuria or hematuria. No urinary frequency or urgency. She is sexually active with one female partner and does not use protection. Reports they have been monogamous for several years. She is status post BTL and has had a right oophorectomy secondary to a large cyst. Also has had a cholecystectomy. Is not on any birth control pills. Denies fevers, chills, nausea, vomiting or diarrhea.  ROS: See HPI Constitutional: no fever  Eyes: no drainage  ENT: no runny nose   Cardiovascular:  no chest pain  Resp: no SOB  GI: no vomiting GU: no dysuria Integumentary: no rash  Allergy: no hives  Musculoskeletal: no leg swelling  Neurological: no slurred speech ROS otherwise negative  PAST MEDICAL HISTORY/PAST SURGICAL HISTORY:  Past Medical History  Diagnosis Date  . Asthma   . Bipolar 1 disorder   . Anxiety   . Endometriosis   . Mental disorder   . Depression     MEDICATIONS:  Prior to Admission medications   Medication Sig Start Date End Date Taking? Authorizing Provider  albuterol (PROVENTIL HFA;VENTOLIN HFA) 108 (90 BASE) MCG/ACT inhaler Inhale 1-2 puffs into the lungs every 6 (six) hours as needed for wheezing or shortness of breath.    Historical Provider, MD  aspirin-acetaminophen-caffeine (EXCEDRIN MIGRAINE) [phone] MG per tablet Take 1  tablet by mouth every 6 (six) hours as needed for headache.    Historical Provider, MD  clonazePAM (KLONOPIN) 1 MG tablet Take 1 tablet (1 mg total) by mouth 2 (two) times daily as needed (anxiety). Patient not taking: Reported on 08/08/2014 03/18/14   Sanjuana Kava, NP  divalproex (DEPAKOTE ER) 500 MG 24 hr tablet Take 1 tablet (500 mg total) by mouth at bedtime. For mood stabilization Patient not taking: Reported on 08/08/2014 03/18/14   Sanjuana Kava, NP  ondansetron (ZOFRAN) 4 MG tablet Take 1 tablet (4 mg total) by mouth every 6 (six) hours. Patient not taking: Reported on 09/23/2014 08/08/14   Oswaldo Conroy, PA-C  predniSONE (DELTASONE) 10 MG tablet Take 2 tablets (20 mg total) by mouth daily. 09/23/14   Rolan Bucco, MD  ranitidine (ZANTAC) 150 MG capsule Take 1 capsule (150 mg total) by mouth daily. 09/23/14   Rolan Bucco, MD  traZODone (DESYREL) 100 MG tablet Take 1 tablet (100 mg total) by mouth at bedtime and may repeat dose one time if needed. For sleep Patient not taking: Reported on 08/08/2014 03/18/14   Sanjuana Kava, NP  venlafaxine XR (EFFEXOR-XR) 75 MG 24 hr capsule Take 1 capsule (75 mg total) by mouth daily with breakfast. For sleep Patient not taking: Reported on 08/08/2014 03/18/14   Sanjuana Kava, NP    ALLERGIES:  Allergies  Allergen Reactions  . Coconut Flavor Anaphylaxis    SOCIAL HISTORY:  History  Substance Use Topics  . Smoking  status: Current Every Day Smoker -- 0.25 packs/day for 13 years    Types: Cigarettes  . Smokeless tobacco: Not on file  . Alcohol Use: Yes     Comment: Drinks twice a month    FAMILY HISTORY: History reviewed. No pertinent family history.  EXAM: BP 128/76 mmHg  Pulse 80  Temp(Src) 98 F (36.7 C) (Oral)  Resp 16  Ht 5\' 3"  (1.6 m)  Wt 197 lb (89.359 kg)  BMI 34.91 kg/m2  SpO2 98%  LMP 12/01/2014 CONSTITUTIONAL: Alert and oriented and responds appropriately to questions. Well-appearing; well-nourished HEAD:  Normocephalic EYES: Conjunctivae clear, PERRL ENT: normal nose; no rhinorrhea; moist mucous membranes; pharynx without lesions noted NECK: Supple, no meningismus, no LAD  CARD: RRR; S1 and S2 appreciated; no murmurs, no clicks, no rubs, no gallops RESP: Normal chest excursion without splinting or tachypnea; breath sounds clear and equal bilaterally; no wheezes, no rhonchi, no rales,  ABD/GI: Normal bowel sounds; non-distended; soft, tender to palpation in the left lower quadrant without guarding or rebound or peritoneal signs GU:  Patient has normal external genitalia, no cervical motion tenderness, patient is thin white vaginal discharge without odor, no vaginal bleeding, patient has left adnexal tenderness without fullness BACK:  The back appears normal and is non-tender to palpation, there is no CVA tenderness EXT: Normal ROM in all joints; non-tender to palpation; no edema; normal capillary refill; no cyanosis    SKIN: Normal color for age and race; warm NEURO: Moves all extremities equally PSYCH: The patient's mood and manner are appropriate. Grooming and personal hygiene are appropriate.  MEDICAL DECISION MAKING: Patient here with left-sided pelvic pain for the past month. Differential includes endometriosis, ovarian cyst, less likely torsion or ectopic. Will obtain urine, urine pregnancy, pelvic cultures and transvaginal ultrasound with Doppler. We'll give Vicodin and ibuprofen for pain.  ED PROGRESS: Patient does have clue cells on her wet prep. Given she is having an odor and she does have vaginal discharge on exam will treat with Flagyl. Urine shows no sign of infection and she is not pregnant. Transvaginal ultrasound shows a normal left ovary without cyst or sign of torsion. Discussed with patient I will start her on Sprintec for birth control that may help with pain given her history of endometriosis but that she will ultimately need to follow up with OB/GYN as outpatient. We'll give [facility] and wellness follow-up information and OB/GYN follow-up information. We'll discharge with Vicodin and ibuprofen. Discussed return precautions. She verbalizes understanding and is comfortable with plan     Layla MawKristen N Damian Hofstra, DO 12/11/14 1251

## 2014-12-11 NOTE — ED Notes (Signed)
Pt sts left lower abd pain and lower back pain. Denies N,V,D. sts odor to urine.

## 2014-12-11 NOTE — ED Notes (Signed)
Dr. Ward at the bedside.  

## 2014-12-11 NOTE — Discharge Instructions (Signed)
Abdominal Pain, Women °Abdominal (stomach, pelvic, or belly) pain can be caused by many things. It is important to tell your doctor: °· The location of the pain. °· Does it come and go or is it present all the time? °· Are there things that start the pain (eating certain foods, exercise)? °· Are there other symptoms associated with the pain (fever, nausea, vomiting, diarrhea)? °All of this is helpful to know when trying to find the cause of the pain. °CAUSES  °· Stomach: virus or bacteria infection, or ulcer. °· Intestine: appendicitis (inflamed appendix), regional ileitis (Crohn's disease), ulcerative colitis (inflamed colon), irritable bowel syndrome, diverticulitis (inflamed diverticulum of the colon), or cancer of the stomach or intestine. °· Gallbladder disease or stones in the gallbladder. °· Kidney disease, kidney stones, or infection. °· Pancreas infection or cancer. °· Fibromyalgia (pain disorder). °· Diseases of the female organs: °· Uterus: fibroid (non-cancerous) tumors or infection. °· Fallopian tubes: infection or tubal pregnancy. °· Ovary: cysts or tumors. °· Pelvic adhesions (scar tissue). °· Endometriosis (uterus lining tissue growing in the pelvis and on the pelvic organs). °· Pelvic congestion syndrome (female organs filling up with blood just before the menstrual period). °· Pain with the menstrual period. °· Pain with ovulation (producing an egg). °· Pain with an IUD (intrauterine device, birth control) in the uterus. °· Cancer of the female organs. °· Functional pain (pain not caused by a disease, may improve without treatment). °· Psychological pain. °· Depression. °DIAGNOSIS  °Your doctor will decide the seriousness of your pain by doing an examination. °· Blood tests. °· X-rays. °· Ultrasound. °· CT scan (computed tomography, special type of X-ray). °· MRI (magnetic resonance imaging). °· Cultures, for infection. °· Barium enema (dye inserted in the large intestine, to better view it with  X-rays). °· Colonoscopy (looking in intestine with a lighted tube). °· Laparoscopy (minor surgery, looking in abdomen with a lighted tube). °· Major abdominal exploratory surgery (looking in abdomen with a large incision). °TREATMENT  °The treatment will depend on the cause of the pain.  °· Many cases can be observed and treated at home. °· Over-the-counter medicines recommended by your caregiver. °· Prescription medicine. °· Antibiotics, for infection. °· Birth control pills, for painful periods or for ovulation pain. °· Hormone treatment, for endometriosis. °· Nerve blocking injections. °· Physical therapy. °· Antidepressants. °· Counseling with a psychologist or psychiatrist. °· Minor or major surgery. °HOME CARE INSTRUCTIONS  °· Do not take laxatives, unless directed by your caregiver. °· Take over-the-counter pain medicine only if ordered by your caregiver. Do not take aspirin because it can cause an upset stomach or bleeding. °· Try a clear liquid diet (broth or water) as ordered by your caregiver. Slowly move to a bland diet, as tolerated, if the pain is related to the stomach or intestine. °· Have a thermometer and take your temperature several times a day, and record it. °· Bed rest and sleep, if it helps the pain. °· Avoid sexual intercourse, if it causes pain. °· Avoid stressful situations. °· Keep your follow-up appointments and tests, as your caregiver orders. °· If the pain does not go away with medicine or surgery, you may try: °· Acupuncture. °· Relaxation exercises (yoga, meditation). °· Group therapy. °· Counseling. °SEEK MEDICAL CARE IF:  °· You notice certain foods cause stomach pain. °· Your home care treatment is not helping your pain. °· You need stronger pain medicine. °· You want your IUD removed. °· You feel faint or   lightheaded.  You develop nausea and vomiting.  You develop a rash.  You are having side effects or an allergy to your medicine. SEEK IMMEDIATE MEDICAL CARE IF:   Your  pain does not go away or gets worse.  You have a fever.  Your pain is felt only in portions of the abdomen. The right side could possibly be appendicitis. The left lower portion of the abdomen could be colitis or diverticulitis.  You are passing blood in your stools (bright red or black tarry stools, with or without vomiting).  You have blood in your urine.  You develop chills, with or without a fever.  You pass out. MAKE SURE YOU:   Understand these instructions.  Will watch your condition.  Will get help right away if you are not doing well or get worse. Document Released: 06/13/2007 Document Revised: 12/31/2013 Document Reviewed: 07/03/2009 [facility] Patient Information 2015 [place], [place]. This information is not intended to replace advice given to you by your health care provider. Make sure you discuss any questions you have with your health care provider.  Bacterial Vaginosis Bacterial vaginosis is a vaginal infection that occurs when the normal balance of bacteria in the vagina is disrupted. It results from an overgrowth of certain bacteria. This is the most common vaginal infection in women of childbearing age. Treatment is important to prevent complications, especially in pregnant women, as it can cause a premature delivery. CAUSES  Bacterial vaginosis is caused by an increase in harmful bacteria that are normally present in smaller amounts in the vagina. Several different kinds of bacteria can cause bacterial vaginosis. However, the reason that the condition develops is not fully understood. RISK FACTORS Certain activities or behaviors can put you at an increased risk of developing bacterial vaginosis, including:  Having a new sex partner or multiple sex partners.  Douching.  Using an intrauterine device (IUD) for contraception. Women do not get bacterial vaginosis from toilet seats, bedding, swimming pools, or contact with objects around them. SIGNS AND SYMPTOMS  Some  women with bacterial vaginosis have no signs or symptoms. Common symptoms include:  Grey vaginal discharge.  A fishlike odor with discharge, especially after sexual intercourse.  Itching or burning of the vagina and vulva.  Burning or pain with urination. DIAGNOSIS  Your health care provider will take a medical history and examine the vagina for signs of bacterial vaginosis. A sample of vaginal fluid may be taken. Your health care provider will look at this sample under a microscope to check for bacteria and abnormal cells. A vaginal pH test may also be done.  TREATMENT  Bacterial vaginosis may be treated with antibiotic medicines. These may be given in the form of a pill or a vaginal cream. A second round of antibiotics may be prescribed if the condition comes back after treatment.  HOME CARE INSTRUCTIONS   Only take over-the-counter or prescription medicines as directed by your health care provider.  If antibiotic medicine was prescribed, take it as directed. Make sure you finish it even if you start to feel better.  Do not have sex until treatment is completed.  Tell all sexual partners that you have a vaginal infection. They should see their health care provider and be treated if they have problems, such as a mild rash or itching.  Practice safe sex by using condoms and only having one sex partner. SEEK MEDICAL CARE IF:   Your symptoms are not improving after 3 days of treatment.  You have increased  discharge or pain.  You have a fever. MAKE SURE YOU:   Understand these instructions.  Will watch your condition.  Will get help right away if you are not doing well or get worse. FOR MORE INFORMATION  Centers for Disease Control and Prevention, Division of STD Prevention: SolutionApps.co.zawww.cdc.gov/std American Sexual Health Association (ASHA): www.ashastd.org  Document Released: 08/16/2005 Document Revised: 06/06/2013 Document Reviewed: 03/28/2013 [facility] Patient Information 2015  [place]. This information is not intended to replace advice given to you by your health care provider. Make sure you discuss any questions you have with your health care provider.   Possible Endometriosis Endometriosis is a condition in which the tissue that lines the uterus (endometrium) grows outside of its normal location. The tissue may grow in many locations close to the uterus, but it commonly grows on the ovaries, fallopian tubes, vagina, or bowel. Because the uterus expels, or sheds, its lining every menstrual cycle, there is bleeding wherever the endometrial tissue is located. This can cause pain because blood is irritating to tissues not normally exposed to it.  CAUSES  The cause of endometriosis is not known.  SIGNS AND SYMPTOMS  Often, there are no symptoms. When symptoms are present, they can vary with the location of the displaced tissue. Various symptoms can occur at different times. Although symptoms occur mainly during a woman's menstrual period, they can also occur midcycle and usually stop with menopause. Some people may go months with no symptoms at all. Symptoms may include:   Back or abdominal pain.   Heavier bleeding during periods.   Pain during intercourse.   Painful bowel movements.   Infertility. DIAGNOSIS  Your health care provider will do a physical exam and ask about your symptoms. Various tests may be done, such as:   Blood tests and urine tests. These are done to help rule out other problems.   Ultrasound. This test is done to look for abnormal tissue.   An X-ray of the lower bowel (barium enema).  Laparoscopy. In this procedure, a thin, lighted tube with a tiny camera on the end (laparoscope) is inserted into your abdomen. This helps your health care provider look for abnormal tissue to confirm the diagnosis. The health care provider may also remove a small piece of tissue (biopsy) from any abnormal tissue found. This tissue sample can then be  sent to a lab so it can be looked at under a microscope. TREATMENT  Treatment will vary and may include:   Medicines to relieve pain. Nonsteroidal anti-inflammatory drugs (NSAIDs) are a type of pain medicine that can help to relieve the pain caused by endometriosis.  Hormonal therapy. When using hormonal therapy, periods are eliminated. This eliminates the monthly exposure to blood by the displaced endometrial tissue.   Surgery. Surgery may sometimes be done to remove the abnormal endometrial tissue. In severe cases, surgery may be done to remove the fallopian tubes, uterus, and ovaries (hysterectomy). HOME CARE INSTRUCTIONS   Take all medicines as directed by your health care provider. Do not take aspirin because it may increase bleeding when you are not on hormonal therapy.   Avoid activities that produce pain, including sexual activity. SEEK MEDICAL CARE IF:  You have pelvic pain before, after, or during your periods.  You have pelvic pain between periods that gets worse during your period.  You have pelvic pain during or after sex.  You have pelvic pain with bowel movements or urination, especially during your period.  You have problems getting  pregnant.  You have a fever. SEEK IMMEDIATE MEDICAL CARE IF:   Your pain is severe and is not responding to pain medicine.   You have severe nausea and vomiting, or you cannot keep foods down.   You have pain that is limited to the right lower part of your abdomen.   You have swelling or increasing pain in your abdomen.   You see blood in your stool.  MAKE SURE YOU:   Understand these instructions.  Will watch your condition.  Will get help right away if you are not doing well or get worse. Document Released: 08/13/2000 Document Revised: 12/31/2013 Document Reviewed: 04/13/2013 [facility] Patient Information 2015 [place], [place]. This information is not intended to replace advice given to you by your health care  provider. Make sure you discuss any questions you have with your health care provider.    Emergency Department Resource Guide 1) Find a Doctor and Pay Out of Pocket Although you won't have to find out who is covered by your insurance plan, it is a good idea to ask around and get recommendations. You will then need to call the office and see if the doctor you have chosen will accept you as a new patient and what types of options they offer for patients who are self-pay. Some doctors offer discounts or will set up payment plans for their patients who do not have insurance, but you will need to ask so you aren't surprised when you get to your appointment.  2) Contact Your Local Health Department Not all health departments have doctors that can see patients for sick visits, but many do, so it is worth a call to see if yours does. If you don't know where your local health department is, you can check in your phone book. The CDC also has a tool to help you locate your state's health department, and many state websites also have listings of all of their local health departments.  3) Find a Walk-in Clinic If your illness is not likely to be very severe or complicated, you may want to try a walk in clinic. These are popping up all over the country in pharmacies, drugstores, and shopping centers. They're usually staffed by nurse practitioners or physician assistants that have been trained to treat common illnesses and complaints. They're usually fairly quick and inexpensive. However, if you have serious medical issues or chronic medical problems, these are probably not your best option.  No Primary Care Doctor: - Call Health Connect at  [phone] - they can help you locate a primary care doctor that  accepts your insurance, provides certain services, etc. - Physician Referral Service- [phone]  Chronic Pain Problems: Organization         Address  Phone   Notes  [facility]  [phone] Patients need to be referred by their primary care doctor.   Medication Assistance: Organization         Address  Phone   Notes  [facility] Medication [facility] [address] [place] [place], [place] [address] [phone] --Must be a resident of [facility] -- Must have NO insurance coverage whatsoever (no Medicaid/ Medicare, etc.) -- The pt. MUST have a primary care doctor that directs their care regularly and follows them in the community   [facility]  [phone]   [employer]  [phone]    Agencies that provide inexpensive medical care: Organization  Address  Phone   Notes  [facility]  [phone]   [facility]    [phone]   [facility] [address] [place], [place] [address] [phone]   [facility] [address], [place] [phone]   Planned Parenthood    [phone]   [facility]    [phone]   [facility]  [address], [place] Phone:  [phone], Fax:  [phone] Hours of Operation:  9 am - 6 pm, M-F.  Also accepts Medicaid/Medicare and self-pay.  [facility]  [address], [place] Phone: [phone], Fax: [phone]. Hours of Operation:  8:30 am - 5:30 pm, M-F.  Also accepts Medicaid and self-pay.  [facility] [place] [address] [place] Phone: [phone]   [facility] [address] Natasha Bence [place], [place] [phone], Ext. [phone] Mondays & Thursdays: 7-9 AM.  First 15 patients are seen on a first come, first serve basis.    Medicaid-accepting [facility] Providers:  Organization         Address  Phone   Notes  [facility] [address], [place] [phone] Also accepts self-pay patients.  [facility] [address] Laurell Josephs [place], [place]   [phone]   [facility] [address], [place] [phone]   [facility] [address], [place] [phone]   Renaye Rakers [address], [place]   [phone] Only accepts [place] Access [place] patients after they have their name applied to their card.   Self-Pay (no insurance) in [facility]:  Organization         Address  Phone   Notes  Sickle Cell Patients, [facility] [address] [place], [place] [phone]   [facility] [address] [place], [place] [phone]   [facility] [place]  [address], [place] [phone]   [facility]/Dr. Osei-Bonsu  [address] [place] or [address], [place] [phone] Phone number for both [place] and [place] locations is the same.  [facility] [address] [place] [phone]   [facility] [address] [place] or [address] [phone]   [facility] [address] [place] [phone], phone; [phone], fax Sees patients 1st and 3rd Saturday of every month.  Must not qualify for public or private insurance (i.e. Medicaid, Medicare, [place] Health Choice, Veterans' Benefits)  Household income should be no more than 200% of the poverty level The clinic cannot treat you if you are pregnant or think you are pregnant  Sexually transmitted diseases are not treated at the clinic.    Dental Care: Organization         Address  Phone  Notes  [facility] Department of [facility] [address] [place], [place] [phone] Accepts children up to age 58 who are enrolled in [place] or [place] Health Choice; pregnant women with a Medicaid card; and children who have applied for Medicaid or [place] Health Choice, but  were declined, whose parents can pay a reduced fee at time  of service.  [facility] Department of [facility]  [address], [place] [phone] Accepts children up to age 12 who are enrolled in [place] or [place] Health Choice; pregnant women with a Medicaid card; and children who have applied for Medicaid or [place] Health Choice, but were declined, whose parents can pay a reduced fee at time of service.  [facility]  [address] [place], [place] [phone] Patients are seen by appointment only. Walk-ins are not accepted. [facility] will see patients 58 years of age and older. Monday - Tuesday (8am-5pm) Most Wednesdays (8:30-5pm) $30 per visit, cash only  [facility]  [address], [facility] [phone] Patients are seen by appointment only. Walk-ins are not accepted. [facility] will see patients 25 years of age and older. One Wednesday Evening (Monthly: Volunteer Based).  $30 per visit, cash only  [employer]  [phone] for adults; Children under age 10, call Graduate Pediatric Dentistry at [phone]. Children aged 11-14, please call [phone] to request a pediatric application.  Dental services are provided in all areas of dental care including fillings, crowns and bridges, complete and partial dentures, implants, gum treatment, root canals, and extractions. Preventive care is also provided. Treatment is provided to both adults and children. Patients are selected via a lottery and there is often a waiting list.   [facility] [address] [place]  [phone] [email or web address]   [facility] [address] [place], [place] [phone], Ext. [phone] Second and Fourth Thursday of each month, opens at 6:30 AM; Clinic ends at 9 AM.  Patients are seen on a first-come first-served basis, and a limited number are seen during each clinic.    [facility]  [address] [place], [place] [phone]   Eligibility Requirements You must have lived in [place], [place], or [place] counties for at least the last three months.   You cannot be eligible for state or federal sponsored [place], including [employer], [place], or [employer].   You generally cannot be eligible for healthcare insurance through your employer.    How to apply: Eligibility screenings are held every Tuesday and Wednesday afternoon from 1:00 pm until 4:00 pm. You do not need an appointment for the interview!  [facility] [address] [place], [place] [phone]   [facility] Health Department  [phone]   [facility] Health Department  [phone]   [facility] Health Department  [phone]    Behavioral Health Resources in the Community: Intensive Outpatient Programs Organization         Address  Phone  Notes  [facility] [address], [place], [place] [phone]   [facility] Outpatient [address] [place], [place] [phone]   [facility] [address], [place], [place]  [phone]   [facility] Mental Health [address],  [place], [place] [phone] or [phone]   Substance Abuse Resources Organization         Address  Phone  Notes  [facility]  [phone]   [facility]  [phone]   [facility]  [phone]   Floydene Flock  [phone]   Residential & Outpatient Substance Abuse Program  [phone]   Psychological Services Organization         Address  Phone  Notes  [facility]  [phone]   [facility]  [phone]   [facility] Health [address], [place] [phone] or [phone]    Mobile Crisis Teams Organization         Address  Phone  Notes  [facility], Mobile Crisis Care  Unit  [phone]   [facility]  [address] [place], [place] [phone]   Doristine Locks [address] [place] [phone]    Self-Help/Support Groups Organization         Address  Phone             Notes  [facility] [place] - variety of support groups  [phone]- I7437963 Call for more information  Narcotics Anonymous (NA), [facility] [address], [employer] [place]  2 meetings at this location   Statistician         Address  Phone  Notes  [facility] [address],    [place]  [phone]   [facility]  [address] [place] [address], [place], [place] [phone]   [facility] [address] [place], [place] [phone] Admissions: 8am-3pm M-F  [facility] [address]    [place], [place] [phone]   [facility] [address] [place], [place], [place] [phone]   [facility] [address]  [place], [place] [phone]   [facility] - Intensive Outpatient [address]ure[address], [place], [place] [phone]   [facility] ([facility] [address] [place]  [place], [place] [phone] or [phone]   [facility] ([facility]) [address] [place], [place] [phone] Accepts Medicaid  [facility] [address]  [place] [phone] Substance Abuse/Addiction Treatment   [facility] Organization         Address  Phone  Notes  [facility]  [phone]   Angie Fava, PhD [address] Ervin Knack [place], [place]   [phone] or [phone]   [facility]   [address] [place], [place] [phone]   [facility] [address] [place], [place] [phone] Insurance/Medicaid/sponsorship through [facility] [address], [address]                                     [place], [place] [phone] Therapy/tele-psych/case  [facility] [address], [place] [phone]    Dr. Lolly Mustache  [phone]   [facility]  [employer] [facility] Dept. 1) [address], [place] 2) [address], [place] 3)  [address], [place] [phone]  [phone]   [facility] Child Abuse Hotline [phone])001-2095 or [phone] (After Hours)

## 2014-12-12 LAB — GC/CHLAMYDIA PROBE AMP (~~LOC~~) NOT AT ARMC
Chlamydia: NEGATIVE
NEISSERIA GONORRHEA: NEGATIVE

## 2015-01-06 ENCOUNTER — Encounter (HOSPITAL_COMMUNITY): Payer: Self-pay | Admitting: Emergency Medicine

## 2015-01-06 ENCOUNTER — Emergency Department (HOSPITAL_COMMUNITY): Payer: Medicaid Other

## 2015-01-06 ENCOUNTER — Emergency Department (HOSPITAL_COMMUNITY)
Admission: EM | Admit: 2015-01-06 | Discharge: 2015-01-06 | Disposition: A | Discharge status: Home / Self Care | Payer: Self-pay | Attending: Emergency Medicine | Admitting: Emergency Medicine

## 2015-01-06 DIAGNOSIS — Z3202 Encounter for pregnancy test, result negative: Secondary | ICD-10-CM | POA: Insufficient documentation

## 2015-01-06 DIAGNOSIS — R630 Anorexia: Secondary | ICD-10-CM | POA: Insufficient documentation

## 2015-01-06 DIAGNOSIS — J45909 Unspecified asthma, uncomplicated: Secondary | ICD-10-CM | POA: Insufficient documentation

## 2015-01-06 DIAGNOSIS — Z79899 Other long term (current) drug therapy: Secondary | ICD-10-CM | POA: Insufficient documentation

## 2015-01-06 DIAGNOSIS — Z793 Long term (current) use of hormonal contraceptives: Secondary | ICD-10-CM | POA: Insufficient documentation

## 2015-01-06 DIAGNOSIS — Z792 Long term (current) use of antibiotics: Secondary | ICD-10-CM | POA: Insufficient documentation

## 2015-01-06 DIAGNOSIS — K2970 Gastritis, unspecified, without bleeding: Secondary | ICD-10-CM | POA: Insufficient documentation

## 2015-01-06 DIAGNOSIS — Z7952 Long term (current) use of systemic steroids: Secondary | ICD-10-CM | POA: Insufficient documentation

## 2015-01-06 DIAGNOSIS — Z720 Tobacco use: Secondary | ICD-10-CM | POA: Insufficient documentation

## 2015-01-06 DIAGNOSIS — Z87448 Personal history of other diseases of urinary system: Secondary | ICD-10-CM | POA: Insufficient documentation

## 2015-01-06 DIAGNOSIS — Z8659 Personal history of other mental and behavioral disorders: Secondary | ICD-10-CM | POA: Insufficient documentation

## 2015-01-06 LAB — COMPREHENSIVE METABOLIC PANEL
ALT: 20 U/L (ref 14–54)
AST: 16 U/L (ref 15–41)
Albumin: 3.6 g/dL (ref 3.5–5.0)
Alkaline Phosphatase: 60 U/L (ref 38–126)
Anion gap: 8 (ref 5–15)
BILIRUBIN TOTAL: 0.5 mg/dL (ref 0.3–1.2)
BUN: 10 mg/dL (ref 6–20)
CHLORIDE: 108 mmol/L (ref 101–111)
CO2: 23 mmol/L (ref 22–32)
Calcium: 9.1 mg/dL (ref 8.9–10.3)
Creatinine, Ser: 0.84 mg/dL (ref 0.44–1.00)
GFR calc Af Amer: >60 mL/min (ref >60)
GLUCOSE: 95 mg/dL (ref 70–99)
Potassium: 3.5 mmol/L (ref 3.5–5.1)
Sodium: 139 mmol/L (ref 135–145)
Total Protein: 6.3 g/dL — ABNORMAL LOW (ref 6.5–8.1)

## 2015-01-06 LAB — CBC WITH DIFFERENTIAL/PLATELET
BASOS ABS: 0.1 10*3/uL (ref 0.0–0.1)
BASOS PCT: 1 % (ref 0–1)
Eosinophils Absolute: 0.1 10*3/uL (ref 0.0–0.7)
Eosinophils Relative: 1 % (ref 0–5)
HCT: 40.1 % (ref 36.0–46.0)
Hemoglobin: 13.7 g/dL (ref 12.0–15.0)
Lymphocytes Relative: 35 % (ref 12–46)
Lymphs Abs: 1.7 10*3/uL (ref 0.7–4.0)
MCH: 31.7 pg (ref 26.0–34.0)
MCHC: 34.2 g/dL (ref 30.0–36.0)
MCV: 92.8 fL (ref 78.0–100.0)
MONO ABS: 0.4 10*3/uL (ref 0.1–1.0)
Monocytes Relative: 8 % (ref 3–12)
NEUTROS ABS: 2.7 10*3/uL (ref 1.7–7.7)
NEUTROS PCT: 55 % (ref 43–77)
Platelets: 187 10*3/uL (ref 150–400)
RBC: 4.32 MIL/uL (ref 3.87–5.11)
RDW: 13.1 % (ref 11.5–15.5)
WBC: 5 10*3/uL (ref 4.0–10.5)

## 2015-01-06 LAB — URINALYSIS, ROUTINE W REFLEX MICROSCOPIC
GLUCOSE, UA: NEGATIVE mg/dL
KETONES UR: 15 mg/dL — AB
Nitrite: NEGATIVE
PROTEIN: NEGATIVE mg/dL
Specific Gravity, Urine: 1.035 — ABNORMAL HIGH (ref 1.005–1.030)
Urobilinogen, UA: 0.2 mg/dL (ref 0.0–1.0)
pH: 5.5 (ref 5.0–8.0)

## 2015-01-06 LAB — URINE MICROSCOPIC-ADD ON

## 2015-01-06 LAB — VALPROIC ACID LEVEL: Valproic Acid Lvl: <10 ug/mL — ABNORMAL LOW (ref 50.0–100.0)

## 2015-01-06 LAB — LIPASE, BLOOD: LIPASE: 23 U/L (ref 22–51)

## 2015-01-06 LAB — POC URINE PREG, ED: Preg Test, Ur: NEGATIVE

## 2015-01-06 MED ORDER — GI COCKTAIL ~~LOC~~
30.0000 mL | Freq: Once | ORAL | Status: AC
  Administered 2015-01-06: 30 mL via ORAL
  Filled 2015-01-06: qty 30

## 2015-01-06 MED ORDER — PANTOPRAZOLE SODIUM 20 MG PO TBEC: 20.0000 mg | DELAYED_RELEASE_TABLET | Freq: Every day | ORAL | Status: DC

## 2015-01-06 MED ORDER — ONDANSETRON HCL 4 MG/2ML IJ SOLN
4.0000 mg | Freq: Once | INTRAMUSCULAR | Status: AC
  Administered 2015-01-06: 4 mg via INTRAVENOUS
  Filled 2015-01-06: qty 2

## 2015-01-06 MED ORDER — MORPHINE SULFATE 4 MG/ML IJ SOLN
4.0000 mg | Freq: Once | INTRAMUSCULAR | Status: AC
  Administered 2015-01-06: 4 mg via INTRAVENOUS
  Filled 2015-01-06: qty 1

## 2015-01-06 MED ORDER — IOHEXOL 300 MG/ML  SOLN
100.0000 mL | Freq: Once | INTRAMUSCULAR | Status: AC | PRN
  Administered 2015-01-06: 100 mL via INTRAVENOUS

## 2015-01-06 MED ORDER — FAMOTIDINE 20 MG PO TABS: 20.0000 mg | ORAL_TABLET | Freq: Two times a day (BID) | ORAL | Status: DC

## 2015-01-06 MED ORDER — PANTOPRAZOLE SODIUM 40 MG IV SOLR
40.0000 mg | Freq: Once | INTRAVENOUS | Status: AC
  Administered 2015-01-06: 40 mg via INTRAVENOUS
  Filled 2015-01-06: qty 40

## 2015-01-06 MED ORDER — SUCRALFATE 1 G PO TABS
1.0000 g | ORAL_TABLET | Freq: Once | ORAL | Status: AC
  Administered 2015-01-06: 1 g via ORAL
  Filled 2015-01-06: qty 1

## 2015-01-06 MED ORDER — KETOROLAC TROMETHAMINE 30 MG/ML IJ SOLN
30.0000 mg | Freq: Once | INTRAMUSCULAR | Status: AC
  Administered 2015-01-06: 30 mg via INTRAVENOUS
  Filled 2015-01-06: qty 1

## 2015-01-06 MED ORDER — IOHEXOL 300 MG/ML  SOLN
25.0000 mL | Freq: Once | INTRAMUSCULAR | Status: AC | PRN
  Administered 2015-01-06: 25 mL via ORAL

## 2015-01-06 NOTE — ED Notes (Signed)
Patient returned from CT

## 2015-01-06 NOTE — ED Notes (Signed)
Milana HuntsmanMade Tatyana, PA aware of patient's pain. NO new orders at this time.

## 2015-01-06 NOTE — ED Provider Notes (Signed)
CSN: [id number]     Arrival date & time 01/06/15  4098 History   First MD Initiated Contact with Patient 01/06/15 1031     Chief Complaint  Patient presents with  . Abdominal Pain     (Consider location/radiation/quality/duration/timing/severity/associated sxs/prior Treatment) HPI [name] is a 29 y.o. female history of asthma, bipolar disorder, presents to emergency department complaining of abdominal pain. Patient states pain is in epigastric area. Started 2 days ago. She reports similar symptoms 4 years ago, states had an inflamed gallbladder and had to get it out. States no pain since then. She has tried taking ibuprofen 800 mg with no relief of her pain. She reports associated nausea. States unable to eat without discomfort. States she's now eating only once a day. she also states she tried some times with no relief. She reports nausea and vomiting this morning. She reports weakness. She states she feels dizzy like she is going to "pass out." She denies lower abdominal pain. She denies any back pain. She denies any diarrhea. No blood in her stool or emesis. She reports chills, however did not measure her temperature at home. She denies pregnancy. No other complaints =   Past Medical History  Diagnosis Date  . Asthma   . Bipolar 1 disorder   . Anxiety   . Endometriosis   . Mental disorder   . Depression    Past Surgical History  Procedure Laterality Date  . Cholecystectomy    . Abdominal surgery    . Tubal ligation     History reviewed. No pertinent family history. History  Substance Use Topics  . Smoking status: Current Every Day Smoker -- 0.25 packs/day for 13 years    Types: Cigarettes  . Smokeless tobacco: Not on file  . Alcohol Use: Yes     Comment: Drinks twice a month   OB History    No data available     Review of Systems  Constitutional: Negative for fever and chills.  Respiratory: Negative for cough, chest tightness and shortness of breath.    Cardiovascular: Negative for chest pain, palpitations and leg swelling.  Gastrointestinal: Positive for abdominal pain. Negative for nausea, vomiting, diarrhea, constipation and blood in stool.  Genitourinary: Negative for dysuria, flank pain, vaginal bleeding, vaginal discharge, vaginal pain and pelvic pain.  Musculoskeletal: Negative for myalgias, arthralgias, neck pain and neck stiffness.  Skin: Negative for rash.  Neurological: Negative for dizziness, weakness and headaches.  All other systems reviewed and are negative.     Allergies  Coconut flavor  Home Medications   Prior to Admission medications   Medication Sig Start Date End Date Taking? Authorizing Provider  albuterol (PROVENTIL HFA;VENTOLIN HFA) 108 (90 BASE) MCG/ACT inhaler Inhale 1-2 puffs into the lungs every 6 (six) hours as needed for wheezing or shortness of breath.    Historical Provider, MD  aspirin-acetaminophen-caffeine (EXCEDRIN MIGRAINE) [phone] MG per tablet Take 1 tablet by mouth every 6 (six) hours as needed for headache.    Historical Provider, MD  clonazePAM (KLONOPIN) 1 MG tablet Take 1 tablet (1 mg total) by mouth 2 (two) times daily as needed (anxiety). Patient not taking: Reported on 08/08/2014 03/18/14   Sanjuana Kava, NP  divalproex (DEPAKOTE ER) 500 MG 24 hr tablet Take 1 tablet (500 mg total) by mouth at bedtime. For mood stabilization Patient not taking: Reported on 08/08/2014 03/18/14   Sanjuana Kava, NP  HYDROcodone-acetaminophen (NORCO/VICODIN) 5-325 MG per tablet Take 1 tablet by mouth every  4 (four) hours as needed. 12/11/14   Kristen N Ward, DO  ibuprofen (ADVIL,MOTRIN) 800 MG tablet Take 1 tablet (800 mg total) by mouth every 8 (eight) hours as needed for mild pain. 12/11/14   Kristen N Ward, DO  metroNIDAZOLE (FLAGYL) 500 MG tablet Take 1 tablet (500 mg total) by mouth 2 (two) times daily. 12/11/14   Kristen N Ward, DO  norgestimate-ethinyl estradiol (SPRINTEC 28) 0.25-35 MG-MCG tablet Take 1  tablet by mouth daily. 12/11/14   Kristen N Ward, DO  ondansetron (ZOFRAN) 4 MG tablet Take 1 tablet (4 mg total) by mouth every 6 (six) hours. Patient not taking: Reported on 09/23/2014 08/08/14   Oswaldo ConroyVictoria Creech, PA-C  predniSONE (DELTASONE) 10 MG tablet Take 2 tablets (20 mg total) by mouth daily. 09/23/14   Rolan BuccoMelanie Belfi, MD  ranitidine (ZANTAC) 150 MG capsule Take 1 capsule (150 mg total) by mouth daily. 09/23/14   Rolan BuccoMelanie Belfi, MD  traZODone (DESYREL) 100 MG tablet Take 1 tablet (100 mg total) by mouth at bedtime and may repeat dose one time if needed. For sleep Patient not taking: Reported on 08/08/2014 03/18/14   Sanjuana KavaAgnes I Nwoko, NP  venlafaxine XR (EFFEXOR-XR) 75 MG 24 hr capsule Take 1 capsule (75 mg total) by mouth daily with breakfast. For sleep Patient not taking: Reported on 08/08/2014 03/18/14   Sanjuana KavaAgnes I Nwoko, NP   BP 104/66 mmHg  Pulse 70  Temp(Src) 98.2 F (36.8 C)  Resp 18  Wt 176 lb (79.833 kg)  SpO2 97%  LMP 11/29/2014 Physical Exam  Constitutional: She appears well-developed and well-nourished. No distress.  HENT:  Head: Normocephalic.  Eyes: Conjunctivae are normal.  Neck: Neck supple.  Cardiovascular: Normal rate, regular rhythm and normal heart sounds.   Pulmonary/Chest: Effort normal and breath sounds normal. No respiratory distress. She has no wheezes. She has no rales.  Abdominal: Soft. Bowel sounds are normal. She exhibits no distension. There is tenderness. There is no rebound and no guarding.  Epigastric tenderness  Musculoskeletal: She exhibits no edema.  Neurological: She is alert.  Skin: Skin is warm and dry.  Psychiatric: She has a normal mood and affect. Her behavior is normal.  Nursing note and vitals reviewed.   ED Course  Procedures (including critical care time) Labs Review Labs Reviewed  COMPREHENSIVE METABOLIC PANEL - Abnormal; Notable for the following:    Total Protein 6.3 (*)    All other components within normal limits  URINALYSIS,  ROUTINE W REFLEX MICROSCOPIC - Abnormal; Notable for the following:    Color, Urine AMBER (*)    Specific Gravity, Urine 1.035 (*)    Hgb urine dipstick MODERATE (*)    Bilirubin Urine SMALL (*)    Ketones, ur 15 (*)    Leukocytes, UA SMALL (*)    All other components within normal limits  VALPROIC ACID LEVEL - Abnormal; Notable for the following:    Valproic Acid Lvl <10 (*)    All other components within normal limits  URINE MICROSCOPIC-ADD ON - Abnormal; Notable for the following:    Squamous Epithelial / LPF MANY (*)    Bacteria, UA FEW (*)    Casts GRANULAR CAST (*)    All other components within normal limits  LIPASE, BLOOD  CBC WITH DIFFERENTIAL/PLATELET  POC URINE PREG, ED    Imaging Review Ct Abdomen Pelvis W Contrast  01/06/2015   CLINICAL DATA:  Upper abdominal pain for the past 2 days.  EXAM: CT ABDOMEN AND PELVIS WITH CONTRAST  TECHNIQUE: Multidetector CT imaging of the abdomen and pelvis was performed using the standard protocol following bolus administration of intravenous contrast.  CONTRAST:  100mL OMNIPAQUE IOHEXOL 300 MG/ML  SOLN  COMPARISON:  Pelvic ultrasound dated 12/11/2014  FINDINGS: Cholecystectomy clips. Calcified granulomata in the spleen. Normal appearing liver, pancreas, adrenal glands, kidneys, urinary bladder, uterus and left ovary. Surgically absent right ovary. No gastrointestinal abnormalities or enlarged lymph nodes. Normal appearing appendix. Sclerosis on both sides of the anterior aspect of the left sacroiliac joint, most likely degenerative. Minimal lumbar and mild lower thoracic spine degenerative changes. Clear lung bases. Tiny umbilical hernia containing fat.  IMPRESSION: 1. No acute abnormality. 2. Tiny umbilical hernia containing fat. 3. Status post right oophorectomy.   Electronically Signed   By: Beckie SaltsSteven  Reid M.D.   On: 01/06/2015 14:43     EKG Interpretation None      MDM   Final diagnoses:  Gastritis     patient with epigastric  abdominal pain, nausea, symptoms for 2 days. History of cholecystectomy, but states this feels similar. Cholecystectomy is 4 years ago, I doubt she has a retained stone, no symptoms prior to this. Will get labs including lipase and LFTs. We'll try a GI cocktail, protonix, pain medications. Patient's abdomen is soft.   12:50 PM Patient is not feeling any better after GI cocktail, Protonix, morphine, Zofran, Toradol. She is laying in bed in a ball, states it's painful to lay stretched out. I discussed with Dr. Silverio LayYao, since not improving with treatment we will do a CT scan of abdomen and pelvis.  3:01 PM CT is negative. No improvement with Carafate either. Patient does not appear to be in any distress. She is talking on the phone. I suspect she may have gastritis versus PUD. Will start on Pepcid, Protonix, Maalox as needed. Follow with primary care doctor. She states she has an [facility] in about a week or 2 [facility]  Filed Vitals:   01/06/15 1345 01/06/15 1400 01/06/15 1443 01/06/15 1445  BP: 126/77 122/87 132/81 119/79  Pulse: 66 56 64 48  Temp:      Resp:   12   Weight:      SpO2: 92% 98% 96% 97%     Jaynie Crumbleatyana Timiko Offutt, PA-C 01/06/15 1544  Richardean Canalavid H Yao, MD 01/06/15 [phone]

## 2015-01-06 NOTE — ED Notes (Signed)
Called Phlebotomy to obtain blood level.

## 2015-01-06 NOTE — ED Notes (Signed)
Tatyana, PA at the bedside  

## 2015-01-06 NOTE — ED Notes (Signed)
Pt reports upper abdominal pain for the last 2 days. Reports subjective fever.

## 2015-01-06 NOTE — ED Notes (Signed)
Attempted blood draw.

## 2015-01-06 NOTE — ED Notes (Signed)
Pt finished contrast. CT made aware.

## 2015-01-06 NOTE — Discharge Instructions (Signed)
Take both pepcid and protonix daily. Take maalox in addition for quick pain relief. Avoid smoking, no NSAID medications such as ibuprofen, aleve, naprosyn, motrin, no drinking alcohol, no spicy and tomato based foods. Avoid caffeine. Follow up with primary care doctor.     Gastritis, Adult Gastritis is soreness and swelling (inflammation) of the lining of the stomach. Gastritis can develop as a sudden onset (acute) or long-term (chronic) condition. If gastritis is not treated, it can lead to stomach bleeding and ulcers. CAUSES  Gastritis occurs when the stomach lining is weak or damaged. Digestive juices from the stomach then inflame the weakened stomach lining. The stomach lining may be weak or damaged due to viral or bacterial infections. One common bacterial infection is the Helicobacter pylori infection. Gastritis can also result from excessive alcohol consumption, taking certain medicines, or having too much acid in the stomach.  SYMPTOMS  In some cases, there are no symptoms. When symptoms are present, they may include:  Pain or a burning sensation in the upper abdomen.  Nausea.  Vomiting.  An uncomfortable feeling of fullness after eating. DIAGNOSIS  Your caregiver may suspect you have gastritis based on your symptoms and a physical exam. To determine the cause of your gastritis, your caregiver may perform the following:  Blood or stool tests to check for the H pylori bacterium.  Gastroscopy. A thin, flexible tube (endoscope) is passed down the esophagus and into the stomach. The endoscope has a light and camera on the end. Your caregiver uses the endoscope to view the inside of the stomach.  Taking a tissue sample (biopsy) from the stomach to examine under a microscope. TREATMENT  Depending on the cause of your gastritis, medicines may be prescribed. If you have a bacterial infection, such as an H pylori infection, antibiotics may be given. If your gastritis is caused by too much  acid in the stomach, H2 blockers or antacids may be given. Your caregiver may recommend that you stop taking aspirin, ibuprofen, or other nonsteroidal anti-inflammatory drugs (NSAIDs). HOME CARE INSTRUCTIONS  Only take over-the-counter or prescription medicines as directed by your caregiver.  If you were given antibiotic medicines, take them as directed. Finish them even if you start to feel better.  Drink enough fluids to keep your urine clear or pale yellow.  Avoid foods and drinks that make your symptoms worse, such as:  Caffeine or alcoholic drinks.  Chocolate.  Peppermint or mint flavorings.  Garlic and onions.  Spicy foods.  Citrus fruits, such as oranges, lemons, or limes.  Tomato-based foods such as sauce, chili, salsa, and pizza.  Fried and fatty foods.  Eat small, frequent meals instead of large meals. SEEK IMMEDIATE MEDICAL CARE IF:   You have black or dark red stools.  You vomit blood or material that looks like coffee grounds.  You are unable to keep fluids down.  Your abdominal pain gets worse.  You have a fever.  You do not feel better after 1 week.  You have any other questions or concerns. MAKE SURE YOU:  Understand these instructions.  Will watch your condition.  Will get help right away if you are not doing well or get worse. Document Released: 08/10/2001 Document Revised: 02/15/2012 Document Reviewed: 09/29/2011 [facility] Patient Information 2015 [place]. This information is not intended to replace advice given to you by your health care provider. Make sure you discuss any questions you have with your health care provider.

## 2015-01-15 ENCOUNTER — Other Ambulatory Visit: Payer: Self-pay | Admitting: Obstetrics and Gynecology

## 2015-01-15 ENCOUNTER — Encounter: Payer: Self-pay | Admitting: Obstetrics and Gynecology

## 2015-01-15 ENCOUNTER — Ambulatory Visit (INDEPENDENT_AMBULATORY_CARE_PROVIDER_SITE_OTHER): Payer: Medicaid Other | Admitting: Obstetrics and Gynecology

## 2015-01-15 VITALS — BP 115/80 | HR 71 | Temp 98.1°F | Ht 63.0 in | Wt 177.1 lb

## 2015-01-15 DIAGNOSIS — Z118 Encounter for screening for other infectious and parasitic diseases: Secondary | ICD-10-CM

## 2015-01-15 DIAGNOSIS — Z113 Encounter for screening for infections with a predominantly sexual mode of transmission: Secondary | ICD-10-CM

## 2015-01-15 DIAGNOSIS — R102 Pelvic and perineal pain: Secondary | ICD-10-CM | POA: Insufficient documentation

## 2015-01-15 LAB — POCT URINALYSIS DIP (DEVICE)
Glucose, UA: NEGATIVE mg/dL
Leukocytes, UA: NEGATIVE
NITRITE: NEGATIVE
Protein, ur: 100 mg/dL — AB
Specific Gravity, Urine: >=1.03 (ref 1.005–1.030)
UROBILINOGEN UA: 0.2 mg/dL (ref 0.0–1.0)
pH: 5.5 (ref 5.0–8.0)

## 2015-01-15 MED ORDER — NORGESTIMATE-ETH ESTRADIOL 0.25-35 MG-MCG PO TABS: 1.0000 | ORAL_TABLET | Freq: Every day | ORAL | Status: DC

## 2015-01-15 NOTE — Progress Notes (Signed)
Patient ID: [name], female   DOB: 06/04/1986, 29 y.o.   MRN: [id number] 29 yo [id number] with LMP 01/02/2015 presenting today as an ED follow up for the evaluation of left lower quadrant pain. Patient was diagnosed with mild endometriosis in 2005 with fulguration of endometrial implants. Patient had right oophorectomy in 2011 with BTL without mention of endometriosis visible in the pelvis at that time. Patient reports feeling well up until 1 year ago when her lower abdominal pain returned. She states the pain is similar to her endometriosis pain. She is unable to describe the pain. She reports that the pain is worst with intercourse. It is located in the left lower quadrant. There are no alleviating factors. She reports urinary frequency and urgency. She denies dysuria. She reports regular bowel movement without constipation. Her pain is not relieved after a bowel movement nor is it aggravated by the ingestion of certain foods. She reports irregular painful cycles in the past few months which have significantly improved with the initiation of OCP 2 months ago. She is sexually active with a new partner and is requesting STD screen.   Past Medical History  Diagnosis Date  . Asthma   . Bipolar 1 disorder   . Anxiety   . Endometriosis   . Mental disorder   . Depression    Past Surgical History  Procedure Laterality Date  . Cholecystectomy    . Abdominal surgery    . Tubal ligation     No family history on file. History  Substance Use Topics  . Smoking status: Current Every Day Smoker -- 0.25 packs/day for 13 years    Types: Cigarettes  . Smokeless tobacco: Not on file  . Alcohol Use: Yes     Comment: Drinks twice a month   ROS See pertinent in HPI  GENERAL: Well-developed, well-nourished female in no acute distress.  ABDOMEN: Soft, nontender, nondistended. No organomegaly. PELVIC: Normal external female genitalia. Vagina is pink and rugated.  Normal discharge. Normal appearing cervix.  Uterus is normal in size. No adnexal mass or tenderness. Mild suprapubic tenderness EXTREMITIES: No cyanosis, clubbing, or edema, 2+ distal pulses.  12/11/2014 Ultrasound FINDINGS: Uterus  Measurements: 8.5 x 3.8 x 5.2 cm. No fibroids or other mass visualized.  Endometrium  Thickness: 4 mm. No focal abnormality visualized.  Right ovary  Surgically absent.  Left ovary  Measurements: 2.7 x 2.5 x 2.5 cm. 1.4 x 1.0 x 1.2 cm simple cyst/follicle.  Pulsed Doppler evaluation of the left ovary demonstrates normal low-resistance arterial and venous waveforms.  Other findings  No free fluid.  IMPRESSION: Status post right oophorectomy. Otherwise negative pelvic ultrasound. No evidence of left ovarian torsion.  01/06/2015 CT Abd/pelvis FINDINGS: Cholecystectomy clips. Calcified granulomata in the spleen. Normal appearing liver, pancreas, adrenal glands, kidneys, urinary bladder, uterus and left ovary. Surgically absent right ovary. No gastrointestinal abnormalities or enlarged lymph nodes. Normal appearing appendix. Sclerosis on both sides of the anterior aspect of the left sacroiliac joint, most likely degenerative. Minimal lumbar and mild lower thoracic spine degenerative changes. Clear lung bases. Tiny umbilical hernia containing fat.  IMPRESSION: 1. No acute abnormality. 2. Tiny umbilical hernia containing fat. 3. Status post right oophorectomy.  A/P 29 yo with pelvic pain - STD screen performed - Wet prep collected - Urine culture also sent - Discussed to continue OCP to help control endometriosis if it is a recurrence. Reassured the patient that there is no evidence of ovarian cysts on her last radiologic imaging -  Refill on OCP provided - Discussed the need to refer to Urology for possible evaluation for interstitial cystitis. Patient is uninsured at the moment but will take the information in case she is able to afford the initial evaluation - Patient  will be contacted with any abnormal results - RTC prn

## 2015-01-16 ENCOUNTER — Telehealth: Payer: Self-pay

## 2015-01-16 LAB — GC/CHLAMYDIA PROBE AMP
CT Probe RNA: NEGATIVE
GC PROBE AMP APTIMA: NEGATIVE

## 2015-01-16 LAB — WET PREP, GENITAL
Trich, Wet Prep: NONE SEEN
Yeast Wet Prep HPF POC: NONE SEEN

## 2015-01-16 LAB — HEPATITIS B SURFACE ANTIGEN: HEP B S AG: NEGATIVE

## 2015-01-16 LAB — HIV ANTIBODY (ROUTINE TESTING W REFLEX): HIV 1&2 Ab, 4th Generation: NONREACTIVE

## 2015-01-16 LAB — HEPATITIS C ANTIBODY: HCV AB: NEGATIVE

## 2015-01-16 LAB — RPR

## 2015-01-16 MED ORDER — METRONIDAZOLE 500 MG PO TABS: 500.0000 mg | ORAL_TABLET | Freq: Two times a day (BID) | ORAL | Status: DC

## 2015-01-16 NOTE — Telephone Encounter (Signed)
Called patient and informed her of results and RX at pharmacy. Patient verbalized understanding and gratitude. No questions or concerns.

## 2015-01-16 NOTE — Telephone Encounter (Signed)
-----   Message from Catalina AntiguaPeggy Constant, MD sent at 01/16/2015  9:19 AM EDT ----- Please inform the patient of positive BV. Flagyl e-prescribed  Thanks  Kinder Morgan EnergyPeggy

## 2015-01-16 NOTE — Addendum Note (Signed)
Addended by: Catalina AntiguaONSTANT, Tamantha Saline on: 01/16/2015 09:19 AM   Modules accepted: Orders

## 2015-01-26 ENCOUNTER — Encounter (HOSPITAL_COMMUNITY): Payer: Self-pay | Admitting: Emergency Medicine

## 2015-01-26 ENCOUNTER — Emergency Department (HOSPITAL_COMMUNITY): Payer: Self-pay

## 2015-01-26 ENCOUNTER — Emergency Department (HOSPITAL_COMMUNITY)
Admission: EM | Admit: 2015-01-26 | Discharge: 2015-01-26 | Disposition: A | Discharge status: Home / Self Care | Payer: Self-pay | Attending: Emergency Medicine | Admitting: Emergency Medicine

## 2015-01-26 DIAGNOSIS — Z8659 Personal history of other mental and behavioral disorders: Secondary | ICD-10-CM | POA: Insufficient documentation

## 2015-01-26 DIAGNOSIS — R109 Unspecified abdominal pain: Secondary | ICD-10-CM

## 2015-01-26 DIAGNOSIS — Z7982 Long term (current) use of aspirin: Secondary | ICD-10-CM | POA: Insufficient documentation

## 2015-01-26 DIAGNOSIS — Z720 Tobacco use: Secondary | ICD-10-CM | POA: Insufficient documentation

## 2015-01-26 DIAGNOSIS — Z9049 Acquired absence of other specified parts of digestive tract: Secondary | ICD-10-CM | POA: Insufficient documentation

## 2015-01-26 DIAGNOSIS — R1011 Right upper quadrant pain: Secondary | ICD-10-CM | POA: Insufficient documentation

## 2015-01-26 DIAGNOSIS — Z8742 Personal history of other diseases of the female genital tract: Secondary | ICD-10-CM | POA: Insufficient documentation

## 2015-01-26 DIAGNOSIS — Z79899 Other long term (current) drug therapy: Secondary | ICD-10-CM | POA: Insufficient documentation

## 2015-01-26 DIAGNOSIS — Z9851 Tubal ligation status: Secondary | ICD-10-CM | POA: Insufficient documentation

## 2015-01-26 DIAGNOSIS — R112 Nausea with vomiting, unspecified: Secondary | ICD-10-CM | POA: Insufficient documentation

## 2015-01-26 DIAGNOSIS — Z3202 Encounter for pregnancy test, result negative: Secondary | ICD-10-CM | POA: Insufficient documentation

## 2015-01-26 DIAGNOSIS — J45909 Unspecified asthma, uncomplicated: Secondary | ICD-10-CM | POA: Insufficient documentation

## 2015-01-26 LAB — CBC WITH DIFFERENTIAL/PLATELET
BASOS ABS: 0 10*3/uL (ref 0.0–0.1)
BASOS PCT: 1 % (ref 0–1)
EOS PCT: 1 % (ref 0–5)
Eosinophils Absolute: 0.1 10*3/uL (ref 0.0–0.7)
HCT: 39.8 % (ref 36.0–46.0)
HEMOGLOBIN: 13.6 g/dL (ref 12.0–15.0)
LYMPHS ABS: 1.7 10*3/uL (ref 0.7–4.0)
Lymphocytes Relative: 34 % (ref 12–46)
MCH: 32.2 pg (ref 26.0–34.0)
MCHC: 34.2 g/dL (ref 30.0–36.0)
MCV: 94.3 fL (ref 78.0–100.0)
Monocytes Absolute: 0.3 10*3/uL (ref 0.1–1.0)
Monocytes Relative: 7 % (ref 3–12)
NEUTROS PCT: 57 % (ref 43–77)
Neutro Abs: 2.8 10*3/uL (ref 1.7–7.7)
PLATELETS: 162 10*3/uL (ref 150–400)
RBC: 4.22 MIL/uL (ref 3.87–5.11)
RDW: 13.3 % (ref 11.5–15.5)
WBC: 4.9 10*3/uL (ref 4.0–10.5)

## 2015-01-26 LAB — URINALYSIS, ROUTINE W REFLEX MICROSCOPIC
GLUCOSE, UA: NEGATIVE mg/dL
HGB URINE DIPSTICK: NEGATIVE
Ketones, ur: NEGATIVE mg/dL
Leukocytes, UA: NEGATIVE
Nitrite: NEGATIVE
Protein, ur: NEGATIVE mg/dL
SPECIFIC GRAVITY, URINE: 1.029 (ref 1.005–1.030)
Urobilinogen, UA: 0.2 mg/dL (ref 0.0–1.0)
pH: 5.5 (ref 5.0–8.0)

## 2015-01-26 LAB — COMPREHENSIVE METABOLIC PANEL
ALT: 14 U/L (ref 14–54)
AST: 14 U/L — AB (ref 15–41)
Albumin: 3.6 g/dL (ref 3.5–5.0)
Alkaline Phosphatase: 55 U/L (ref 38–126)
Anion gap: 7 (ref 5–15)
BILIRUBIN TOTAL: 0.3 mg/dL (ref 0.3–1.2)
BUN: 10 mg/dL (ref 6–20)
CALCIUM: 8.7 mg/dL — AB (ref 8.9–10.3)
CHLORIDE: 108 mmol/L (ref 101–111)
CO2: 24 mmol/L (ref 22–32)
Creatinine, Ser: 0.78 mg/dL (ref 0.44–1.00)
GFR calc non Af Amer: >60 mL/min (ref >60)
Glucose, Bld: 96 mg/dL (ref 65–99)
POTASSIUM: 3.6 mmol/L (ref 3.5–5.1)
SODIUM: 139 mmol/L (ref 135–145)
Total Protein: 6.7 g/dL (ref 6.5–8.1)

## 2015-01-26 LAB — LIPASE, BLOOD: Lipase: 17 U/L — ABNORMAL LOW (ref 22–51)

## 2015-01-26 MED ORDER — HYDROCODONE-ACETAMINOPHEN 5-325 MG PO TABS: 1.0000 | ORAL_TABLET | Freq: Four times a day (QID) | ORAL | Status: DC | PRN

## 2015-01-26 NOTE — ED Provider Notes (Signed)
CSN: [id number]     Arrival date & time 01/26/15  1138 History   First MD Initiated Contact with Patient 01/26/15 1149     Chief Complaint  Patient presents with  . Abdominal Pain    RUQ pain, nausea, vomiting, diarrhea     (Consider location/radiation/quality/duration/timing/severity/associated sxs/prior Treatment) HPI [name] is a 29 y.o. female with hx of asthma, bipolar disorder, anxiety, presents to ED with complaint of abdominal pain. She states she has had abdominal pain for several weeks. Pain is in the right upper quadrant. It does not radiate. She has had nausea and vomiting. She has had some loose bowel movements. She denies any fever or chills. She has had history of cholecystectomy. She reports being seen at [facility] emergency department 2 weeks ago and  (by me) and states that we did not give her any answers. States she was discharged on some medications that she has been taking. States she is here for 2nd opinion. States she did try to follow up with [facility] but they were not accepting new pts until June 6th. Pt states she could not work today due to pain so came to ED.    Past Medical History  Diagnosis Date  . Asthma   . Bipolar 1 disorder   . Anxiety   . Endometriosis   . Mental disorder   . Depression    Past Surgical History  Procedure Laterality Date  . Cholecystectomy    . Abdominal surgery    . Tubal ligation     Family History  Problem Relation Age of Onset  . Stroke Mother   . Hyperlipidemia Other    History  Substance Use Topics  . Smoking status: Current Every Day Smoker -- 0.25 packs/day for 13 years    Types: Cigarettes  . Smokeless tobacco: Not on file  . Alcohol Use: Yes     Comment: Drinks twice a month   OB History    Gravida Para Term Preterm AB TAB SAB Ectopic Multiple Living   0 2 0 2 0 0 2     Review of Systems  Constitutional: Negative for fever and chills.  Respiratory: Negative for cough, chest  tightness and shortness of breath.   Cardiovascular: Negative for chest pain, palpitations and leg swelling.  Gastrointestinal: Positive for nausea, vomiting and abdominal pain. Negative for diarrhea.  Genitourinary: Negative for dysuria, flank pain, vaginal bleeding, vaginal discharge, vaginal pain and pelvic pain.  Musculoskeletal: Negative for myalgias, arthralgias, neck pain and neck stiffness.  Skin: Negative for rash.  Neurological: Negative for dizziness, weakness and headaches.  All other systems reviewed and are negative.     Allergies  Coconut flavor and Bee venom  Home Medications   Prior to Admission medications   Medication Sig Start Date End Date Taking? Authorizing Provider  albuterol (PROVENTIL HFA;VENTOLIN HFA) 108 (90 BASE) MCG/ACT inhaler Inhale 1-2 puffs into the lungs every 6 (six) hours as needed for wheezing or shortness of breath.   Yes Historical Provider, MD  aspirin-acetaminophen-caffeine (EXCEDRIN MIGRAINE) [phone] MG per tablet Take 1 tablet by mouth every 6 (six) hours as needed for headache.   Yes Historical Provider, MD  norgestimate-ethinyl estradiol (SPRINTEC 28) 0.25-35 MG-MCG tablet Take 1 tablet by mouth daily. 01/15/15  Yes Peggy Constant, MD  pantoprazole (PROTONIX) 20 MG tablet Take 1 tablet (20 mg total) by mouth daily. 01/06/15  Yes Vincient Vanaman, PA-C  HYDROcodone-acetaminophen (NORCO) 5-325 MG per tablet Take  1 tablet by mouth every 6 (six) hours as needed. 01/26/15   Mandy Fitzwater, PA-C  ibuprofen (ADVIL,MOTRIN) 800 MG tablet Take 1 tablet (800 mg total) by mouth every 8 (eight) hours as needed for mild pain. Patient not taking: Reported on 01/26/2015 12/11/14   Kristen N Ward, DO  metroNIDAZOLE (FLAGYL) 500 MG tablet Take 1 tablet (500 mg total) by mouth 2 (two) times daily. Patient not taking: Reported on 01/26/2015 01/16/15   Catalina Antigua, MD   BP 116/67 mmHg  Pulse 59  Temp(Src) 98.4 F (36.9 C) (Oral)  Resp 17  SpO2 99%  LMP  01/02/2015 Physical Exam  Constitutional: She appears well-developed and well-nourished. No distress.  HENT:  Head: Normocephalic.  Eyes: Conjunctivae are normal.  Neck: Neck supple.  Cardiovascular: Normal rate, regular rhythm and normal heart sounds.   Pulmonary/Chest: Effort normal and breath sounds normal. No respiratory distress. She has no wheezes. She has no rales.  Abdominal: Soft. Bowel sounds are normal. She exhibits no distension. There is tenderness. There is no rebound.  Right upper quadrant tenderness. No CVA tenderness bilaterally  Musculoskeletal: She exhibits no edema.  Neurological: She is alert.  Skin: Skin is warm and dry.  Psychiatric: She has a normal mood and affect. Her behavior is normal.  Nursing note and vitals reviewed.   ED Course  Procedures (including critical care time) Labs Review Labs Reviewed  COMPREHENSIVE METABOLIC PANEL - Abnormal; Notable for the following:    Calcium 8.7 (*)    AST 14 (*)    All other components within normal limits  LIPASE, BLOOD - Abnormal; Notable for the following:    Lipase 17 (*)    All other components within normal limits  URINALYSIS, ROUTINE W REFLEX MICROSCOPIC (NOT AT [facility]) - Abnormal; Notable for the following:    Color, Urine AMBER (*)    APPearance CLOUDY (*)    Bilirubin Urine SMALL (*)    All other components within normal limits  CBC WITH DIFFERENTIAL/PLATELET  PREGNANCY, URINE    Imaging Review Dg Abd 2 Views  01/26/2015   CLINICAL DATA:  Right-sided epigastric pain for 3 days with nausea and vomiting, history of cholecystectomy  EXAM: ABDOMEN - 2 VIEW  COMPARISON:  None.  FINDINGS: Cholecystectomy clips right upper quadrant. No evidence of free air. No abnormally dilated loops of bowel. No abnormal opacities.  IMPRESSION: Negative abdominal radiographs   Electronically Signed   By: Esperanza Heir M.D.   On: 01/26/2015 13:06     EKG Interpretation None      MDM   Final diagnoses:  Abdominal  pain   Patient with persistent right upper quadrant pain for now almost a month. She was seen 2 weeks ago actually by me, she had full evaluation including labs and CT abdomen and pelvis which came back negative. We'll get labs, abdominal x-ray, will monitor. Patient's abdomen is soft with no peritoneal signs.  Labs today are unremarkable. Urinalysis unremarkable. No elevation in white blood cell count. Suspicion for acute infection given how long the pain was there and unremarkable lab work. No evidence of obstruction.  Discussed with Dr. Effie Shy, at this time no further evaluation indicated emergency department. Patient does need to follow-up. Advised her to call [facility] again and get an appointment for June 6. Will give pain medications to take for pain at home. Continue Carafate and Pepcid. Return precautions discussed.  Filed Vitals:   01/26/15 1148 01/26/15 1335  BP: 121/87 116/67  Pulse: 87  59  Temp: 98.2 F (36.8 C) 98.4 F (36.9 C)  TempSrc: Oral Oral  Resp: 16 17  SpO2: 100% 99%         Jaynie Crumbleatyana Bertie Mcconathy, PA-C 01/26/15 1538  Mancel BaleElliott Wentz, MD 01/27/15 [phone]

## 2015-01-26 NOTE — Discharge Instructions (Signed)
norco as prescribed as needed. Please follow up with Wellness center or place of your choice for further evaluation.    Abdominal Pain Many things can cause abdominal pain. Usually, abdominal pain is not caused by a disease and will improve without treatment. It can often be observed and treated at home. Your health care provider will do a physical exam and possibly order blood tests and X-rays to help determine the seriousness of your pain. However, in many cases, more time must pass before a clear cause of the pain can be found. Before that point, your health care provider may not know if you need more testing or further treatment. HOME CARE INSTRUCTIONS  Monitor your abdominal pain for any changes. The following actions may help to alleviate any discomfort you are experiencing:  Only take over-the-counter or prescription medicines as directed by your health care provider.  Do not take laxatives unless directed to do so by your health care provider.  Try a clear liquid diet (broth, tea, or water) as directed by your health care provider. Slowly move to a bland diet as tolerated. SEEK MEDICAL CARE IF:  You have unexplained abdominal pain.  You have abdominal pain associated with nausea or diarrhea.  You have pain when you urinate or have a bowel movement.  You experience abdominal pain that wakes you in the night.  You have abdominal pain that is worsened or improved by eating food.  You have abdominal pain that is worsened with eating fatty foods.  You have a fever. SEEK IMMEDIATE MEDICAL CARE IF:   Your pain does not go away within 2 hours.  You keep throwing up (vomiting).  Your pain is felt only in portions of the abdomen, such as the right side or the left lower portion of the abdomen.  You pass bloody or black tarry stools. MAKE SURE YOU:  Understand these instructions.   Will watch your condition.   Will get help right away if you are not doing well or get worse.   Document Released: 05/26/2005 Document Revised: 08/21/2013 Document Reviewed: 04/25/2013 [facility] Patient Information 2015 [place], [place]. This information is not intended to replace advice given to you by your health care provider. Make sure you discuss any questions you have with your health care provider.

## 2015-01-26 NOTE — ED Notes (Signed)
Pt states she has had RUQ abdominal pain x several months, [facility], pt states she feels the RUQ is more firm than other parts of the abdomen. Pt states she has vomiting x 3 days, diarrhea x 7 days, constant nausea.

## 2015-01-26 NOTE — ED Notes (Signed)
D&C IV 

## 2015-01-27 LAB — PREGNANCY, URINE: PREG TEST UR: NEGATIVE

## 2015-01-29 ENCOUNTER — Ambulatory Visit: Payer: Self-pay

## 2015-02-12 ENCOUNTER — Ambulatory Visit: Payer: Self-pay

## 2015-02-18 ENCOUNTER — Emergency Department (HOSPITAL_COMMUNITY)
Admission: EM | Admit: 2015-02-18 | Discharge: 2015-02-18 | Disposition: A | Discharge status: Home / Self Care | Payer: Self-pay | Attending: Emergency Medicine | Admitting: Emergency Medicine

## 2015-02-18 ENCOUNTER — Emergency Department (HOSPITAL_COMMUNITY): Payer: Self-pay

## 2015-02-18 ENCOUNTER — Encounter (HOSPITAL_COMMUNITY): Payer: Self-pay | Admitting: Emergency Medicine

## 2015-02-18 DIAGNOSIS — Z720 Tobacco use: Secondary | ICD-10-CM | POA: Insufficient documentation

## 2015-02-18 DIAGNOSIS — R112 Nausea with vomiting, unspecified: Secondary | ICD-10-CM | POA: Insufficient documentation

## 2015-02-18 DIAGNOSIS — Z8659 Personal history of other mental and behavioral disorders: Secondary | ICD-10-CM | POA: Insufficient documentation

## 2015-02-18 DIAGNOSIS — Z8742 Personal history of other diseases of the female genital tract: Secondary | ICD-10-CM | POA: Insufficient documentation

## 2015-02-18 DIAGNOSIS — R42 Dizziness and giddiness: Secondary | ICD-10-CM | POA: Insufficient documentation

## 2015-02-18 DIAGNOSIS — B9789 Other viral agents as the cause of diseases classified elsewhere: Secondary | ICD-10-CM

## 2015-02-18 DIAGNOSIS — Z3202 Encounter for pregnancy test, result negative: Secondary | ICD-10-CM | POA: Insufficient documentation

## 2015-02-18 DIAGNOSIS — R079 Chest pain, unspecified: Secondary | ICD-10-CM | POA: Insufficient documentation

## 2015-02-18 DIAGNOSIS — J45901 Unspecified asthma with (acute) exacerbation: Secondary | ICD-10-CM | POA: Insufficient documentation

## 2015-02-18 DIAGNOSIS — Z7982 Long term (current) use of aspirin: Secondary | ICD-10-CM | POA: Insufficient documentation

## 2015-02-18 DIAGNOSIS — H538 Other visual disturbances: Secondary | ICD-10-CM | POA: Insufficient documentation

## 2015-02-18 DIAGNOSIS — R1084 Generalized abdominal pain: Secondary | ICD-10-CM | POA: Insufficient documentation

## 2015-02-18 DIAGNOSIS — J069 Acute upper respiratory infection, unspecified: Secondary | ICD-10-CM | POA: Insufficient documentation

## 2015-02-18 DIAGNOSIS — J988 Other specified respiratory disorders: Secondary | ICD-10-CM

## 2015-02-18 DIAGNOSIS — Z79899 Other long term (current) drug therapy: Secondary | ICD-10-CM | POA: Insufficient documentation

## 2015-02-18 LAB — URINALYSIS, ROUTINE W REFLEX MICROSCOPIC
Glucose, UA: NEGATIVE mg/dL
Hgb urine dipstick: NEGATIVE
Ketones, ur: 15 mg/dL — AB
LEUKOCYTES UA: NEGATIVE
NITRITE: NEGATIVE
PH: 5.5 (ref 5.0–8.0)
Protein, ur: NEGATIVE mg/dL
SPECIFIC GRAVITY, URINE: 1.035 — AB (ref 1.005–1.030)
Urobilinogen, UA: 0.2 mg/dL (ref 0.0–1.0)

## 2015-02-18 LAB — CBC WITH DIFFERENTIAL/PLATELET
Basophils Absolute: 0 10*3/uL (ref 0.0–0.1)
Basophils Relative: 0 % (ref 0–1)
EOS PCT: 1 % (ref 0–5)
Eosinophils Absolute: 0.1 10*3/uL (ref 0.0–0.7)
HCT: 40.4 % (ref 36.0–46.0)
HEMOGLOBIN: 14.1 g/dL (ref 12.0–15.0)
LYMPHS ABS: 1.2 10*3/uL (ref 0.7–4.0)
Lymphocytes Relative: 17 % (ref 12–46)
MCH: 32.1 pg (ref 26.0–34.0)
MCHC: 34.9 g/dL (ref 30.0–36.0)
MCV: 92 fL (ref 78.0–100.0)
MONO ABS: 0.3 10*3/uL (ref 0.1–1.0)
Monocytes Relative: 4 % (ref 3–12)
Neutro Abs: 5.5 10*3/uL (ref 1.7–7.7)
Neutrophils Relative %: 78 % — ABNORMAL HIGH (ref 43–77)
Platelets: 179 10*3/uL (ref 150–400)
RBC: 4.39 MIL/uL (ref 3.87–5.11)
RDW: 12.8 % (ref 11.5–15.5)
WBC: 7.1 10*3/uL (ref 4.0–10.5)

## 2015-02-18 LAB — COMPREHENSIVE METABOLIC PANEL
ALBUMIN: 3.6 g/dL (ref 3.5–5.0)
ALK PHOS: 73 U/L (ref 38–126)
ALT: 19 U/L (ref 14–54)
ANION GAP: 8 (ref 5–15)
AST: 23 U/L (ref 15–41)
BUN: 9 mg/dL (ref 6–20)
CALCIUM: 9 mg/dL (ref 8.9–10.3)
CO2: 22 mmol/L (ref 22–32)
CREATININE: 0.77 mg/dL (ref 0.44–1.00)
Chloride: 109 mmol/L (ref 101–111)
GFR calc Af Amer: >60 mL/min (ref >60)
GFR calc non Af Amer: >60 mL/min (ref >60)
Glucose, Bld: 108 mg/dL — ABNORMAL HIGH (ref 65–99)
Potassium: 3.6 mmol/L (ref 3.5–5.1)
Sodium: 139 mmol/L (ref 135–145)
TOTAL PROTEIN: 6.9 g/dL (ref 6.5–8.1)
Total Bilirubin: 0.3 mg/dL (ref 0.3–1.2)

## 2015-02-18 LAB — POC URINE PREG, ED: Preg Test, Ur: NEGATIVE

## 2015-02-18 MED ORDER — ONDANSETRON HCL 4 MG PO TABS: 4.0000 mg | ORAL_TABLET | Freq: Three times a day (TID) | ORAL | Status: DC | PRN

## 2015-02-18 MED ORDER — IPRATROPIUM-ALBUTEROL 0.5-2.5 (3) MG/3ML IN SOLN
3.0000 mL | Freq: Once | RESPIRATORY_TRACT | Status: AC
  Administered 2015-02-18: 3 mL via RESPIRATORY_TRACT
  Filled 2015-02-18: qty 3

## 2015-02-18 MED ORDER — KETOROLAC TROMETHAMINE 30 MG/ML IJ SOLN
60.0000 mg | Freq: Once | INTRAMUSCULAR | Status: AC
  Administered 2015-02-18: 60 mg via INTRAMUSCULAR
  Filled 2015-02-18: qty 2

## 2015-02-18 MED ORDER — PREDNISONE 10 MG (21) PO TBPK: 10.0000 mg | ORAL_TABLET | Freq: Every day | ORAL | Status: DC

## 2015-02-18 MED ORDER — BENZONATATE 100 MG PO CAPS: 100.0000 mg | ORAL_CAPSULE | Freq: Three times a day (TID) | ORAL | Status: DC

## 2015-02-18 NOTE — ED Notes (Signed)
Pt calledx2, no answer 

## 2015-02-18 NOTE — Discharge Instructions (Signed)
Read the information below.  Use the prescribed medication as directed.  Please discuss all new medications with your pharmacist.  You may return to the Emergency Department at any time for worsening condition or any new symptoms that concern you.   If you develop high fevers that do not resolve with tylenol or ibuprofen, you have difficulty swallowing or breathing, or you are unable to tolerate fluids by mouth, return to the ER for a recheck.      Nausea and Vomiting Nausea means you feel sick to your stomach. Throwing up (vomiting) is a reflex where stomach contents come out of your mouth. HOME CARE   Take medicine as told by your doctor.  Do not force yourself to eat. However, you do need to drink fluids.  If you feel like eating, eat a normal diet as told by your doctor.  Eat rice, wheat, potatoes, bread, lean meats, yogurt, fruits, and vegetables.  Avoid high-fat foods.  Drink enough fluids to keep your pee (urine) clear or pale yellow.  Ask your doctor how to replace body fluid losses (rehydrate). Signs of body fluid loss (dehydration) include:  Feeling very thirsty.  Dry lips and mouth.  Feeling dizzy.  Dark pee.  Peeing less than normal.  Feeling confused.  Fast breathing or heart rate. GET HELP RIGHT AWAY IF:   You have blood in your throw up.  You have black or bloody poop (stool).  You have a bad headache or stiff neck.  You feel confused.  You have bad belly (abdominal) pain.  You have chest pain or trouble breathing.  You do not pee at least once every 8 hours.  You have cold, clammy skin.  You keep throwing up after 24 to 48 hours.  You have a fever. MAKE SURE YOU:   Understand these instructions.  Will watch your condition.  Will get help right away if you are not doing well or get worse. Document Released: 02/02/2008 Document Revised: 11/08/2011 Document Reviewed: 01/15/2011 [facility] Patient Information 2015 [place], [place]. This  information is not intended to replace advice given to you by your health care provider. Make sure you discuss any questions you have with your health care provider.  Viral Infections A virus is a type of germ. Viruses can cause:  Minor sore throats.  Aches and pains.  Headaches.  Runny nose.  Rashes.  Watery eyes.  Tiredness.  Coughs.  Loss of appetite.  Feeling sick to your stomach (nausea).  Throwing up (vomiting).  Watery poop (diarrhea). HOME CARE   Only take medicines as told by your doctor.  Drink enough water and fluids to keep your pee (urine) clear or pale yellow. Sports drinks are a good choice.  Get plenty of rest and eat healthy. Soups and broths with crackers or rice are fine. GET HELP RIGHT AWAY IF:   You have a very bad headache.  You have shortness of breath.  You have chest pain or neck pain.  You have an unusual rash.  You cannot stop throwing up.  You have watery poop that does not stop.  You cannot keep fluids down.  You or your child has a temperature by mouth above 102 F (38.9 C), not controlled by medicine.  Your baby is older than 3 months with a rectal temperature of 102 F (38.9 C) or higher.  Your baby is 49 months old or younger with a rectal temperature of 100.4 F (38 C) or higher. MAKE SURE YOU:  Understand these instructions.  Will watch this condition.  Will get help right away if you are not doing well or get worse. Document Released: 07/29/2008 Document Revised: 11/08/2011 Document Reviewed: 12/22/2010 [facility] Patient Information 2015 [place], [place]. This information is not intended to replace advice given to you by your health care provider. Make sure you discuss any questions you have with your health care provider.    Emergency Department Resource Guide 1) Find a Doctor and Pay Out of Pocket Although you won't have to find out who is covered by your insurance plan, it is a good idea to ask around and  get recommendations. You will then need to call the office and see if the doctor you have chosen will accept you as a new patient and what types of options they offer for patients who are self-pay. Some doctors offer discounts or will set up payment plans for their patients who do not have insurance, but you will need to ask so you aren't surprised when you get to your appointment.  2) Contact Your Local Health Department Not all health departments have doctors that can see patients for sick visits, but many do, so it is worth a call to see if yours does. If you don't know where your local health department is, you can check in your phone book. The CDC also has a tool to help you locate your state's health department, and many state websites also have listings of all of their local health departments.  3) Find a Walk-in Clinic If your illness is not likely to be very severe or complicated, you may want to try a walk in clinic. These are popping up all over the country in pharmacies, drugstores, and shopping centers. They're usually staffed by nurse practitioners or physician assistants that have been trained to treat common illnesses and complaints. They're usually fairly quick and inexpensive. However, if you have serious medical issues or chronic medical problems, these are probably not your best option.  No Primary Care Doctor: - Call Health Connect at  [phone] - they can help you locate a primary care doctor that  accepts your insurance, provides certain services, etc. - Physician Referral Service- [phone]  Chronic Pain Problems: Organization         Address  Phone   Notes  [facility]  [phone] Patients need to be referred by their primary care doctor.   Medication Assistance: Organization         Address  Phone   Notes  [facility] Medication [facility] [address] [place] [place], [place] [address] [phone] --Must be a resident of  [facility] -- Must have NO insurance coverage whatsoever (no Medicaid/ Medicare, etc.) -- The pt. MUST have a primary care doctor that directs their care regularly and follows them in the community   [facility]  [phone]   [employer]  [phone]    Agencies that provide inexpensive medical care: Organization         Address  Phone   Notes  [facility]  [phone]   [facility]    [phone]   [facility] [address] [place], [place] [address] [phone]   [facility] [address] [place] [place] [phone]   [facility]anned Parenthood    [phone]   [facility]    [phone]   Community Health and [facility]  [address], [place] Phone:  [phone], Fax:  [phone] Hours of Operation:  9 am - 6 pm, M-F.  Also accepts Medicaid/Medicare and self-pay.  [facility]  [address], [place] Phone: [phone], Fax: [phone]. Hours of Operation:  8:30 am - 5:30 pm, M-F.  Also accepts Medicaid and self-pay.  [facility] [place] [address], [place] Phone: [phone]   [facility] [address] Natasha Bence [place], [place] [phone], Ext. [phone] Mondays & Thursdays: 7-9 AM.  First 15 patients are seen on a first come, first serve basis.    Medicaid-accepting [facility] Providers:  Organization         Address  Phone   Notes  [facility] [address], [place] [phone] Also accepts self-pay patients.  [facility] [address] Laurell Josephs [place], [place]  [phone]   [facility] [address], [place] [phone]   [facility] [address] [place] [phone]   Renaye Rakers [address], [place]   [phone] Only accepts [place] Access  [place] patients after they have their name applied to their card.   Self-Pay (no insurance) in [facility]:  Organization         Address  Phone   Notes  Sickle Cell Patients, [facility] [address] [place], [place] [phone]   [facility] [address] [place], [place] [phone]   [facility] [place]  [address], [place] [phone]   [facility]/Dr. Osei-Bonsu  [address] [place] or [address], [place] [phone] Phone number for both [place] and [place] locations is the same.  Urgent Medical and [facility] [address], [place] [phone]   [facility] [address], [place] or [address] [phone]   [facility] [address], [place] [phone], phone; [phone], fax Sees patients 1st and 3rd Saturday of every month.  Must not qualify for public or private insurance (i.e. Medicaid, Medicare, [place] Health Choice, Veterans' Benefits)  Household income should be no more than 200% of the poverty level The clinic cannot treat you if you are pregnant or think you are pregnant  Sexually transmitted diseases are not treated at the clinic.    Dental Care: Organization         Address  Phone  Notes  [facility] Department of [name] [address] [place], [place] [phone] Accepts children up to age 29 who are enrolled in [place] or [place] Health Choice; pregnant women with a Medicaid card; and children who have applied for Medicaid or [place] Health Choice, but were declined, whose parents can pay a reduced fee at time of service.  [facility] Department of [facility]  [address], [place] [phone] Accepts children up to age 82 who are enrolled in [place] or [place] Health Choice; pregnant women with a Medicaid  card; and children who have applied for Medicaid or  Health Choice, but were declined, whose parents can pay a reduced fee at time of service.  [facility] Adult Dental Access PROGRAM  [address] [place], [place] [phone]  Patients are seen by appointment only. Walk-ins are not accepted. [facility] will see patients 10 years of age and older. Monday - Tuesday (8am-5pm) Most Wednesdays (8:30-5pm) $30 per visit, cash only  [facility] Adult Dental Access PROGRAM  [address], [facility] [phone] Patients are seen by appointment only. Walk-ins are not accepted. [facility] will see patients 24 years of age and older. One Wednesday Evening (Monthly: Volunteer Based).  $30 per visit, cash only  [employer]  [phone] for adults; Children under age 32, call Graduate Pediatric Dentistry at [phone]. Children aged 71-14, please call [phone] to request a pediatric application.  Dental services are provided in all areas of dental care including fillings, crowns and bridges, complete and partial dentures, implants, gum treatment, root canals, and extractions. Preventive care is also provided. Treatment is provided to both adults and children. Patients are selected via a lottery and there is often a waiting list.   [facility] [address], [place]  [phone] [email or web address]   [facility] [address] [place], [place] [phone], Ext. [phone] Second and Fourth Thursday of each month, opens at 6:30 AM; Clinic ends at 9 AM.  Patients are seen on a first-come first-served basis, and a limited number are seen during each clinic.   [facility]  [address] Ether Griffins [place], [place] [phone]   Eligibility Requirements You must have lived in [place], [place], or [place] counties for at least the last three months.   You cannot be eligible for state or federal sponsored [place],  including [employer], [place], or [employer].   You generally cannot be eligible for healthcare insurance through your employer.    How to apply: Eligibility screenings are held every Tuesday and Wednesday afternoon from 1:00 pm until 4:00 pm. You do not need an appointment for the interview!  [facility] [address] [place], [place] [phone]   [facility] Health Department  [phone]   [facility] Health Department  [phone]   [facility] Health Department  [phone]    Behavioral Health Resources in the Community: Intensive Outpatient Programs Organization         Address  Phone  Notes  [facility] Services [address]  [place], [place] [phone]   [facility] Outpatient [address] [place], [place] [phone]   [facility] [address], [place], [place]  [phone]   [facility] Mental Health [address]  [place], [place] [phone] or [phone]   Substance Abuse Resources Organization         Address  Phone  Notes  Al[facility] Drug Services  [phone]   [facility]  [phone]   [facility] [place]  [phone]   Floydene Flock  [phone]   Residential & Outpatient Substance Abuse Program  [phone]   Psychological Services Organization         Address  Phone  Notes  [facility] Behavioral Health  [phone]   [facility] Services  [phone]   [facility] Mental Health [address], [place] [phone] or [phone]    Mobile Crisis Teams Organization         Address  Phone  Notes  [facility], Mobile Crisis Care Unit  [phone]   [facility]  [address] [place], [place] [phone]   [facility] [address], [place] 47   [place] [phone]    Self-Help/Support Groups Organization         Address  Phone             Notes  [facility]ental Health Assoc.  of [place] - variety of support groups  [phone]- [phone] Call for more information  Narcotics Anonymous (NA), [facility] [address], [employer] [place]  2 meetings at this location   Statistician         Address  Phone  Notes  [facility] [address],    [place]  [phone]   [facility]  [address], [place] [address], [place], [place] [phone]   [facility] [address] [place], [place] [phone] Admissions: 8am-3pm M-F  [facility] [address],     [place], [place] [phone]   [facility] [address] [place], [place], [place] [phone]   [facility] [address],  [place], [place] [phone]   [facility] - Intensive Outpatient [address] Laurell Josephs [address], [place], [place] [phone]   [facility] ([facility] [address] [place]  [place], [place] [phone] or [phone]   [facility] ([facility]) [address] [place], [place] [phone] Accepts Medicaid  [facility] [address],  [place] [phone] Substance Abuse/Addiction Treatment   [facility] Organization         Address  Phone  Notes  [facility]  [phone]   Angie Fava, PhD [address] Ervin Knack [place], [place]   [phone] or [phone]   [facility]al   [address] [place], [place] [phone]   [facility] [address] [place], [place] [phone] Insurance/Medicaid/sponsorship through [employer] [facility] [address]                                    [place], [place] [phone] Therapy/tele-psych/case  [facility] [address], [place] [phone]    Dr. Lolly Mustache  [phone]   [facility]  [employer] [facility] Dept. 1) [address] [place] 2)  [address] [place] 3)  [address], [place] [phone]  [phone]   [facility] Child Abuse Hotline [phone]) 761-0556 or [phone] (After Hours)

## 2015-02-18 NOTE — ED Notes (Signed)
Pt sts N/V and body aches with congestion and cough x 2 days

## 2015-02-18 NOTE — ED Provider Notes (Signed)
CSN: [id number]     Arrival date & time 02/18/15  1402 History   This chart was scribed for Trixie Dredge, PA-C working with Gerhard Munch, MD by Evon Slack, ED Scribe. This patient was seen in room TR11C/TR11C and the patient's care was started at 4:10 PM.      Chief Complaint  Patient presents with  . Emesis  . Generalized Body Aches   Patient is a 29 y.o. female presenting with vomiting. The history is provided by the patient. No language interpreter was used.  Emesis Associated symptoms: abdominal pain and myalgias   Associated symptoms: no diarrhea and no sore throat    HPI Comments: [name] is a 29 y.o. female with hx asthma and smoking presents to the Emergency Department complaining of nausea and vomiting onset 1 day prior. Pt states she began feeling sick 4 days ago. She states that her symptoms initially began with cough and congestion. Pt reports intermittent SOB, wheezing, chest tightness light headedness and blurred vision. Pt state she has been using albuterol inhaler with no relief. Pt states that she feels as if she cant breathe. Pt has tried tylenol cold and benadryl with no relief. Pt states she is unable to tolerate any food with out throwing it back up. Denies sore throat, fever, diarrhea, dysuria, urinary frequency, vaginal bleeding or vaginal discharge. Denies recent sick contacts  LMP 4 weeks prior.    Past Medical History  Diagnosis Date  . Asthma   . Bipolar 1 disorder   . Anxiety   . Endometriosis   . Mental disorder   . Depression    Past Surgical History  Procedure Laterality Date  . Cholecystectomy    . Abdominal surgery    . Tubal ligation     Family History  Problem Relation Age of Onset  . Stroke Mother   . Hyperlipidemia Other    History  Substance Use Topics  . Smoking status: Current Every Day Smoker -- 0.25 packs/day for 13 years    Types: Cigarettes  . Smokeless tobacco: Not on file  . Alcohol Use: Yes     Comment: Drinks  twice a month   OB History    Gravida Para Term Preterm AB TAB SAB Ectopic Multiple Living   0 2 0 2 0 0 2      Review of Systems  Constitutional: Negative for fever.  HENT: Positive for congestion. Negative for sore throat and trouble swallowing.   Eyes: Positive for visual disturbance.  Respiratory: Positive for cough, chest tightness, shortness of breath and wheezing.   Gastrointestinal: Positive for nausea, vomiting and abdominal pain. Negative for diarrhea.  Genitourinary: Negative for dysuria, frequency, vaginal bleeding and vaginal discharge.  Musculoskeletal: Positive for myalgias.  Skin: Negative for rash.  Allergic/Immunologic: Negative for immunocompromised state.  Hematological: Does not bruise/bleed easily.  Psychiatric/Behavioral: Negative for self-injury.    Allergies  Coconut flavor and Bee venom  Home Medications   Prior to Admission medications   Medication Sig Start Date End Date Taking? Authorizing Provider  albuterol (PROVENTIL HFA;VENTOLIN HFA) 108 (90 BASE) MCG/ACT inhaler Inhale 1-2 puffs into the lungs every 6 (six) hours as needed for wheezing or shortness of breath.    Historical Provider, MD  aspirin-acetaminophen-caffeine (EXCEDRIN MIGRAINE) [phone] MG per tablet Take 1 tablet by mouth every 6 (six) hours as needed for headache.    Historical Provider, MD  HYDROcodone-acetaminophen (NORCO) 5-325 MG per tablet Take 1 tablet by mouth every  6 (six) hours as needed. 01/26/15   Tatyana Kirichenko, PA-C  ibuprofen (ADVIL,MOTRIN) 800 MG tablet Take 1 tablet (800 mg total) by mouth every 8 (eight) hours as needed for mild pain. Patient not taking: Reported on 01/26/2015 12/11/14   Kristen N Ward, DO  metroNIDAZOLE (FLAGYL) 500 MG tablet Take 1 tablet (500 mg total) by mouth 2 (two) times daily. Patient not taking: Reported on 01/26/2015 01/16/15   Catalina Antigua, MD  norgestimate-ethinyl estradiol (SPRINTEC 28) 0.25-35 MG-MCG tablet Take 1 tablet by mouth  daily. 01/15/15   Peggy Constant, MD  pantoprazole (PROTONIX) 20 MG tablet Take 1 tablet (20 mg total) by mouth daily. 01/06/15   Tatyana Kirichenko, PA-C   BP 123/84 mmHg  Pulse 80  Temp(Src) 98.2 F (36.8 C) (Oral)  Resp 18  SpO2 100%  LMP 01/20/2015 (Within Days)   Physical Exam  Constitutional: She appears well-developed and well-nourished. No distress.  HENT:  Head: Normocephalic and atraumatic.  Mouth/Throat: Oropharynx is clear and moist.  Neck: Neck supple.  Cardiovascular: Normal rate and regular rhythm.   Pulmonary/Chest: Effort normal and breath sounds normal. No respiratory distress. She has no wheezes. She has no rales.  Abdominal: Soft. She exhibits no distension. There is tenderness. There is no rebound and no guarding.  Mild diffuse tenderness of abdomen.   Neurological: She is alert.  Skin: She is not diaphoretic.  Nursing note and vitals reviewed.   ED Course  Procedures (including critical care time) DIAGNOSTIC STUDIES: Oxygen Saturation is 96% on RA, adequate by my interpretation.    COORDINATION OF CARE: 4:20 PM-Discussed treatment plan with pt at bedside and pt agreed to plan.     Labs Review Labs Reviewed  CBC WITH DIFFERENTIAL/PLATELET - Abnormal; Notable for the following:    Neutrophils Relative % 78 (*)    All other components within normal limits  COMPREHENSIVE METABOLIC PANEL - Abnormal; Notable for the following:    Glucose, Bld 108 (*)    All other components within normal limits  URINALYSIS, ROUTINE W REFLEX MICROSCOPIC (NOT AT [facility]) - Abnormal; Notable for the following:    Color, Urine AMBER (*)    APPearance HAZY (*)    Specific Gravity, Urine 1.035 (*)    Bilirubin Urine SMALL (*)    Ketones, ur 15 (*)    All other components within normal limits  POC URINE PREG, ED    Imaging Review Dg Chest 2 View  02/18/2015   CLINICAL DATA:  Nausea, vomiting, body aches, cough, and congestion for 2 days, history smoking, asthma  EXAM: CHEST   2 VIEW  COMPARISON:  09/23/2014  FINDINGS: Upper normal heart size.  Mediastinal contours and pulmonary vascularity normal.  Minimal chronic peribronchial thickening.  No acute infiltrate, pleural effusion or pneumothorax.  Bones unremarkable.  IMPRESSION: Chronic peribronchial thickening which could reflect chronic bronchitis, smoking or asthma.  No acute infiltrate.   Electronically Signed   By: Ulyses Southward M.D.   On: 02/18/2015 14:49     EKG Interpretation None      MDM   Final diagnoses:  Viral respiratory illness  Non-intractable vomiting with nausea, vomiting of unspecified type   Afebrile, nontoxic patient with constellation of symptoms suggestive of viral syndrome.  No concerning findings on exam.  Labs, xray unremarkable.  Pt feeling better after neb treatment.  Discharged home with supportive care, PCP follow up.  Discussed result, findings, treatment, and follow up  with patient.  Pt given return precautions.  Pt verbalizes  understanding and agrees with plan.       \I personally performed the services described in this documentation, which was scribed in my presence. The recorded information has been reviewed and is accurate.      Trixie Dredge, PA-C 02/18/15 1930  Gerhard Munch, MD 02/19/15 Marlyne Beards

## 2015-04-03 ENCOUNTER — Emergency Department (HOSPITAL_COMMUNITY)
Admission: EM | Admit: 2015-04-03 | Discharge: 2015-04-03 | Disposition: A | Discharge status: Home / Self Care | Payer: Self-pay | Attending: Emergency Medicine | Admitting: Emergency Medicine

## 2015-04-03 ENCOUNTER — Encounter (HOSPITAL_COMMUNITY): Payer: Self-pay | Admitting: *Deleted

## 2015-04-03 DIAGNOSIS — L02411 Cutaneous abscess of right axilla: Secondary | ICD-10-CM | POA: Insufficient documentation

## 2015-04-03 DIAGNOSIS — J45909 Unspecified asthma, uncomplicated: Secondary | ICD-10-CM | POA: Insufficient documentation

## 2015-04-03 DIAGNOSIS — Z720 Tobacco use: Secondary | ICD-10-CM | POA: Insufficient documentation

## 2015-04-03 DIAGNOSIS — Z8659 Personal history of other mental and behavioral disorders: Secondary | ICD-10-CM | POA: Insufficient documentation

## 2015-04-03 DIAGNOSIS — Z7952 Long term (current) use of systemic steroids: Secondary | ICD-10-CM | POA: Insufficient documentation

## 2015-04-03 DIAGNOSIS — Z8742 Personal history of other diseases of the female genital tract: Secondary | ICD-10-CM | POA: Insufficient documentation

## 2015-04-03 DIAGNOSIS — R200 Anesthesia of skin: Secondary | ICD-10-CM | POA: Insufficient documentation

## 2015-04-03 DIAGNOSIS — Z79899 Other long term (current) drug therapy: Secondary | ICD-10-CM | POA: Insufficient documentation

## 2015-04-03 DIAGNOSIS — R202 Paresthesia of skin: Secondary | ICD-10-CM | POA: Insufficient documentation

## 2015-04-03 MED ORDER — CLINDAMYCIN HCL 150 MG PO CAPS: 450.0000 mg | ORAL_CAPSULE | Freq: Three times a day (TID) | ORAL | Status: DC

## 2015-04-03 MED ORDER — OXYCODONE-ACETAMINOPHEN 5-325 MG PO TABS
2.0000 | ORAL_TABLET | Freq: Once | ORAL | Status: AC
  Administered 2015-04-03: 2 via ORAL
  Filled 2015-04-03: qty 2

## 2015-04-03 MED ORDER — LIDOCAINE-EPINEPHRINE (PF) 2 %-1:200000 IJ SOLN
20.0000 mL | Freq: Once | INTRAMUSCULAR | Status: DC
  Filled 2015-04-03: qty 20

## 2015-04-03 MED ORDER — HYDROCODONE-ACETAMINOPHEN 5-325 MG PO TABS: 2.0000 | ORAL_TABLET | ORAL | Status: DC | PRN

## 2015-04-03 NOTE — Discharge Instructions (Signed)
Abscess °An abscess is an infected area that contains a collection of pus and debris. It can occur in almost any part of the body. An abscess is also known as a furuncle or boil. °CAUSES  °An abscess occurs when tissue gets infected. This can occur from blockage of oil or sweat glands, infection of hair follicles, or a minor injury to the skin. As the body tries to fight the infection, pus collects in the area and creates pressure under the skin. This pressure causes pain. People with weakened immune systems have difficulty fighting infections and get certain abscesses more often.  °SYMPTOMS °Usually an abscess develops on the skin and becomes a painful mass that is red, warm, and tender. If the abscess forms under the skin, you may feel a moveable soft area under the skin. Some abscesses break open (rupture) on their own, but most will continue to get worse without care. The infection can spread deeper into the body and eventually into the bloodstream, causing you to feel ill.  °DIAGNOSIS  °Your caregiver will take your medical history and perform a physical exam. A sample of fluid may also be taken from the abscess to determine what is causing your infection. °TREATMENT  °Your caregiver may prescribe antibiotic medicines to fight the infection. However, taking antibiotics alone usually does not cure an abscess. Your caregiver may need to make a small cut (incision) in the abscess to drain the pus. In some cases, gauze is packed into the abscess to reduce pain and to continue draining the area. °HOME CARE INSTRUCTIONS  °· Only take over-the-counter or prescription medicines for pain, discomfort, or fever as directed by your caregiver. °· If you were prescribed antibiotics, take them as directed. Finish them even if you start to feel better. °· If gauze is used, follow your caregiver's directions for changing the gauze. °· To avoid spreading the infection: °· Keep your draining abscess covered with a  bandage. °· Wash your hands well. °· Do not share personal care items, towels, or whirlpools with others. °· Avoid skin contact with others. °· Keep your skin and clothes clean around the abscess. °· Keep all follow-up appointments as directed by your caregiver. °SEEK MEDICAL CARE IF:  °· You have increased pain, swelling, redness, fluid drainage, or bleeding. °· You have muscle aches, chills, or a general ill feeling. °· You have a fever. °MAKE SURE YOU:  °· Understand these instructions. °· Will watch your condition. °· Will get help right away if you are not doing well or get worse. °Document Released: 05/26/2005 Document Revised: 02/15/2012 Document Reviewed: 10/29/2011 °ExitCare® Patient Information ©2015 [employer]. This information is not intended to replace advice given to you by your health care provider. Make sure you discuss any questions you have with your health care provider. ° °Cellulitis °Cellulitis is an infection of the skin and the tissue under the skin. The infected area is usually red and tender. This happens most often in the arms and lower legs. °HOME CARE  °· Take your antibiotic medicine as told. Finish the medicine even if you start to feel better. °· Keep the infected arm or leg raised (elevated). °· Put a warm cloth on the area up to 4 times per day. °· Only take medicines as told by your doctor. °· Keep all doctor visits as told. °GET HELP IF: °· You see red streaks on the skin coming from the infected area. °· Your red area gets bigger or turns a dark color. °·   Your bone or joint under the infected area is painful after the skin heals. °· Your infection comes back in the same area or different area. °· You have a puffy (swollen) bump in the infected area. °· You have new symptoms. °· You have a fever. °GET HELP RIGHT AWAY IF:  °· You feel very sleepy. °· You throw up (vomit) or have watery poop (diarrhea). °· You feel sick and have muscle aches and pains. °MAKE SURE YOU:   °· Understand these instructions. °· Will watch your condition. °· Will get help right away if you are not doing well or get worse. °Document Released: 02/02/2008 Document Revised: 12/31/2013 Document Reviewed: 11/01/2011 °ExitCare® Patient Information ©2015 [employer]. This information is not intended to replace advice given to you by your health care provider. Make sure you discuss any questions you have with your health care provider. ° °

## 2015-04-03 NOTE — ED Notes (Signed)
Pt reports rt underarm abscess. Pt states that this started on Saturday.

## 2015-04-03 NOTE — ED Provider Notes (Signed)
CSN: [id number]     Arrival date & time 04/03/15  1814 History  This chart was scribed for Danelle Berry, PA-C, working with Laurence Spates, MD by Chestine Spore, ED Scribe. The patient was seen in room TR10C/TR10C at 6:34 PM.    Chief Complaint  Patient presents with  . Recurrent Skin Infections      The history is provided by the patient. No language interpreter was used.    HPI Comments:  [name] is a 29 y.o. female who presents to the Emergency Department complaining of and abscess in her right armpit which began 5 days ago. Pt reports that the area was a small red painless bump initially, but it has gotten larger, with more redness and severe pain.  The pain is increased with moving her arm and last night she was unable to sleep due to the severity of pain. She has had some numbness and tingling in her right arm. That 2 weeks ago she had a smaller abscess in the same armpit that she had evaluated, was not drained and was given Bactrim which made her have severe nausea vomiting and itching all over her body. That abscess did heal, currently has no redness or swelling.  She denies any fever, chills, sweats, nausea, vomiting, diarrhea or any other symptoms at this time. Pt denies any other medical issues or allergies to any medications. Pt denies having a PCP and she is heading back to [facility] in 5 days.   Past Medical History  Diagnosis Date  . Asthma   . Bipolar 1 disorder   . Anxiety   . Endometriosis   . Mental disorder   . Depression    Past Surgical History  Procedure Laterality Date  . Cholecystectomy    . Abdominal surgery    . Tubal ligation     Family History  Problem Relation Age of Onset  . Stroke Mother   . Hyperlipidemia Other    History  Substance Use Topics  . Smoking status: Current Every Day Smoker -- 0.25 packs/day for 13 years    Types: Cigarettes  . Smokeless tobacco: Not on file  . Alcohol Use: Yes     Comment: Drinks twice a month   OB  History    Gravida Para Term Preterm AB TAB SAB Ectopic Multiple Living   4 2 2  0 2 0 2 0 0 2     Review of Systems  Constitutional: Negative for fever, chills and diaphoresis.  Gastrointestinal: Negative for nausea and vomiting.  Skin: Positive for color change. Negative for rash and wound.       Right axilla abscess  Neurological: Positive for numbness (right hand/fingers).       Tingling in right hand/fingers      Allergies  Coconut flavor and Bee venom  Home Medications   Prior to Admission medications   Medication Sig Start Date End Date Taking? Authorizing Provider  albuterol (PROVENTIL HFA;VENTOLIN HFA) 108 (90 BASE) MCG/ACT inhaler Inhale 1-2 puffs into the lungs every 6 (six) hours as needed for wheezing or shortness of breath.    Historical Provider, MD  aspirin-acetaminophen-caffeine (EXCEDRIN MIGRAINE) [phone] MG per tablet Take 1 tablet by mouth every 6 (six) hours as needed for headache.    Historical Provider, MD  benzonatate (TESSALON) 100 MG capsule Take 1 capsule (100 mg total) by mouth every 8 (eight) hours. 02/18/15   Trixie Dredge, PA-C  HYDROcodone-acetaminophen (NORCO) 5-325 MG per tablet Take 1 tablet by  mouth every 6 (six) hours as needed. 01/26/15   Tatyana Kirichenko, PA-C  ibuprofen (ADVIL,MOTRIN) 800 MG tablet Take 1 tablet (800 mg total) by mouth every 8 (eight) hours as needed for mild pain. Patient not taking: Reported on 01/26/2015 12/11/14   Kristen N Ward, DO  metroNIDAZOLE (FLAGYL) 500 MG tablet Take 1 tablet (500 mg total) by mouth 2 (two) times daily. Patient not taking: Reported on 01/26/2015 01/16/15   Catalina Antigua, MD  norgestimate-ethinyl estradiol (SPRINTEC 28) 0.25-35 MG-MCG tablet Take 1 tablet by mouth daily. 01/15/15   Peggy Constant, MD  ondansetron (ZOFRAN) 4 MG tablet Take 1 tablet (4 mg total) by mouth every 8 (eight) hours as needed for nausea or vomiting. 02/18/15   Trixie Dredge, PA-C  pantoprazole (PROTONIX) 20 MG tablet Take 1 tablet (20  mg total) by mouth daily. 01/06/15   Tatyana Kirichenko, PA-C  predniSONE (STERAPRED UNI-PAK 21 TAB) 10 MG (21) TBPK tablet Take 1 tablet (10 mg total) by mouth daily. Day 1: take 6 tabs.  Day 2: 5 tabs  Day 3: 4 tabs  Day 4: 3 tabs  Day 5: 2 tabs  Day 6: 1 tab 02/18/15   Trixie Dredge, PA-C   BP 123/86 mmHg  Pulse 77  Temp(Src) 97.9 F (36.6 C) (Oral)  Resp 16  Wt 177 lb 12.8 oz (80.65 kg)  SpO2 98% Physical Exam  Constitutional: She is oriented to person, place, and time. Vital signs are normal. She appears well-developed and well-nourished. She is cooperative.  Non-toxic appearance. She does not appear ill. No distress.  HENT:  Head: Normocephalic and atraumatic.  Right Ear: External ear normal.  Left Ear: External ear normal.  Nose: Nose normal.  Mouth/Throat: Oropharynx is clear and moist. No oropharyngeal exudate.  Eyes: Conjunctivae and EOM are normal. Pupils are equal, round, and reactive to light. Right eye exhibits no discharge. Left eye exhibits no discharge. No scleral icterus.  Neck: Normal range of motion. Neck supple. No JVD present. No tracheal deviation present.  Cardiovascular: Normal rate and regular rhythm.   Pulmonary/Chest: Effort normal and breath sounds normal. No stridor. No respiratory distress.  Musculoskeletal: Normal range of motion. She exhibits no edema.       Right shoulder: Normal. She exhibits normal range of motion and no bony tenderness.       Right elbow: Normal.She exhibits normal range of motion.       Right hand: Normal. She exhibits normal range of motion and normal capillary refill. Normal sensation noted. Normal strength noted.  Lymphadenopathy:    She has no cervical adenopathy.  Neurological: She is alert and oriented to person, place, and time. She exhibits normal muscle tone. Coordination normal.  Skin: Skin is warm, dry and intact. No rash noted. She is not diaphoretic. There is erythema. No cyanosis. No pallor. Nails show no clubbing.   Indurated, red, abscess approximately 2 cm x 5 cm in right axilla without surrounding induration or erythema. no spontaneous drainage. Tender to touch. Nl sensation and normal cap refill in right extremity.  Psychiatric: She has a normal mood and affect. Her behavior is normal. Judgment and thought content normal.  Nursing note and vitals reviewed.   ED Course  Procedures (including critical care time) DIAGNOSTIC STUDIES: Oxygen Saturation is 98% on RA, nl by my interpretation.    COORDINATION OF CARE: 6:58 PM-Discussed treatment plan which includes I&D, pain medication Rx, and abx Rx with pt at bedside and pt agreed to plan.  EMERGENCY DEPARTMENT US SOFT TISSUE INTERPRETATION "Study: Limited Ultrasound of the noted body part in comments below"  INDICATIONS: Soft tissue infection Multiple views of the body part are obtained with a multi-frequency linear probe  PERFORMED BY:  Myself  IMAGES ARCHIVED?: Yes  SIDE:Right   BODY PART:Axilla  FINDINGS: Abcess present  LIMITATIONS: none  INTERPRETATION:  Abcess present and Cellulitis present  COMMENT:      6:49 PM- 4 cc lidocaine with 2% epinephrine  INCISION AND DRAINAGE Performed by: Danelle Berry Consent: Verbal consent obtained. Risks and benefits: risks, benefits and alternatives were discussed Time out performed prior to procedure Type: abscess Body area: right axilla Anesthesia: local infiltration Incision was made with a scalpel. Local anesthetic: lidocaine 2% w epinephrine Anesthetic total: 4 ml Complexity: complex Blunt dissection to break up loculations Drainage: purulent Drainage amount: copious Packing material: n/a Patient tolerance: Patient tolerated the procedure well with no immediate complications.  Labs Review Labs Reviewed - No data to display  Imaging Review No results found.   EKG Interpretation None      MDM   Final diagnoses:  None    Abscess in right axilla - evaluated with  [place], multiple loculations - drainable I&D performed - see note Pt tolerated procedure without any acute issues She was given oral pain medicine, vitals were reviewed and be within normal limits, was discharged in satisfactory condition I do not suspect any MRSA component of infection, patient was given Clinda for 5 days, patient was asked to have the wound checked in 2-3 days. Return precautions were given verbally acknowledged.  The abscess was not packed, I reviewed with the patient how to care for it to keep it draining.    Medications  lidocaine-EPINEPHrine (XYLOCAINE W/EPI) 2 %-1:200000 (PF) injection 20 mL (not administered)  oxyCODONE-acetaminophen (PERCOCET/ROXICET) 5-325 MG per tablet 2 tablet (not administered)   Filed Vitals:   04/03/15 1820 04/03/15 1916  BP: 123/86 125/79  Pulse: 77 66  Temp: 97.9 F (36.6 C)   TempSrc: Oral   Resp: 16 14  Weight: 177 lb 12.8 oz (80.65 kg)   SpO2: 98% 100%   I personally performed the services described in this documentation, which was scribed in my presence. The recorded information has been reviewed and is accurate.    Danelle Berry, PA-C 04/03/15 1940  Laurence Spates, MD 04/03/15 [id number]

## 2015-04-29 ENCOUNTER — Emergency Department (HOSPITAL_COMMUNITY)
Admission: EM | Admit: 2015-04-29 | Discharge: 2015-04-29 | Disposition: A | Discharge status: Home / Self Care | Payer: Self-pay | Attending: Emergency Medicine | Admitting: Emergency Medicine

## 2015-04-29 ENCOUNTER — Encounter (HOSPITAL_COMMUNITY): Payer: Self-pay | Admitting: Family Medicine

## 2015-04-29 ENCOUNTER — Emergency Department (HOSPITAL_COMMUNITY): Payer: Self-pay

## 2015-04-29 DIAGNOSIS — Z720 Tobacco use: Secondary | ICD-10-CM | POA: Insufficient documentation

## 2015-04-29 DIAGNOSIS — Z79899 Other long term (current) drug therapy: Secondary | ICD-10-CM | POA: Insufficient documentation

## 2015-04-29 DIAGNOSIS — Z3202 Encounter for pregnancy test, result negative: Secondary | ICD-10-CM | POA: Insufficient documentation

## 2015-04-29 DIAGNOSIS — Z7982 Long term (current) use of aspirin: Secondary | ICD-10-CM | POA: Insufficient documentation

## 2015-04-29 DIAGNOSIS — R1031 Right lower quadrant pain: Secondary | ICD-10-CM | POA: Insufficient documentation

## 2015-04-29 DIAGNOSIS — F319 Bipolar disorder, unspecified: Secondary | ICD-10-CM | POA: Insufficient documentation

## 2015-04-29 DIAGNOSIS — F419 Anxiety disorder, unspecified: Secondary | ICD-10-CM | POA: Insufficient documentation

## 2015-04-29 DIAGNOSIS — Z8742 Personal history of other diseases of the female genital tract: Secondary | ICD-10-CM | POA: Insufficient documentation

## 2015-04-29 DIAGNOSIS — J45909 Unspecified asthma, uncomplicated: Secondary | ICD-10-CM | POA: Insufficient documentation

## 2015-04-29 LAB — CBC
HCT: 39.5 % (ref 36.0–46.0)
HEMOGLOBIN: 13.4 g/dL (ref 12.0–15.0)
MCH: 31.8 pg (ref 26.0–34.0)
MCHC: 33.9 g/dL (ref 30.0–36.0)
MCV: 93.6 fL (ref 78.0–100.0)
Platelets: 180 10*3/uL (ref 150–400)
RBC: 4.22 MIL/uL (ref 3.87–5.11)
RDW: 13.8 % (ref 11.5–15.5)
WBC: 4 10*3/uL (ref 4.0–10.5)

## 2015-04-29 LAB — URINALYSIS, ROUTINE W REFLEX MICROSCOPIC
Bilirubin Urine: NEGATIVE
Glucose, UA: NEGATIVE mg/dL
Hgb urine dipstick: NEGATIVE
KETONES UR: NEGATIVE mg/dL
LEUKOCYTES UA: NEGATIVE
Nitrite: NEGATIVE
PROTEIN: NEGATIVE mg/dL
Specific Gravity, Urine: 1.026 (ref 1.005–1.030)
UROBILINOGEN UA: 0.2 mg/dL (ref 0.0–1.0)
pH: 6 (ref 5.0–8.0)

## 2015-04-29 LAB — COMPREHENSIVE METABOLIC PANEL
ALK PHOS: 56 U/L (ref 38–126)
ALT: 17 U/L (ref 14–54)
AST: 18 U/L (ref 15–41)
Albumin: 3.4 g/dL — ABNORMAL LOW (ref 3.5–5.0)
Anion gap: 6 (ref 5–15)
BILIRUBIN TOTAL: 0.4 mg/dL (ref 0.3–1.2)
BUN: 6 mg/dL (ref 6–20)
CALCIUM: 8.8 mg/dL — AB (ref 8.9–10.3)
CO2: 22 mmol/L (ref 22–32)
Chloride: 109 mmol/L (ref 101–111)
Creatinine, Ser: 0.68 mg/dL (ref 0.44–1.00)
Glucose, Bld: 94 mg/dL (ref 65–99)
Potassium: 3.8 mmol/L (ref 3.5–5.1)
SODIUM: 137 mmol/L (ref 135–145)
TOTAL PROTEIN: 6 g/dL — AB (ref 6.5–8.1)

## 2015-04-29 LAB — LIPASE, BLOOD: Lipase: 15 U/L — ABNORMAL LOW (ref 22–51)

## 2015-04-29 LAB — POC URINE PREG, ED: PREG TEST UR: NEGATIVE

## 2015-04-29 MED ORDER — NAPROXEN 500 MG PO TABS: 500.0000 mg | ORAL_TABLET | Freq: Two times a day (BID) | ORAL | Status: DC

## 2015-04-29 MED ORDER — FENTANYL CITRATE (PF) 100 MCG/2ML IJ SOLN
50.0000 ug | Freq: Once | INTRAMUSCULAR | Status: AC
  Administered 2015-04-29: 50 ug via INTRAVENOUS
  Filled 2015-04-29: qty 2

## 2015-04-29 MED ORDER — IOHEXOL 300 MG/ML  SOLN
100.0000 mL | Freq: Once | INTRAMUSCULAR | Status: AC | PRN
  Administered 2015-04-29: 100 mL via INTRAVENOUS

## 2015-04-29 MED ORDER — ONDANSETRON HCL 4 MG/2ML IJ SOLN
4.0000 mg | Freq: Once | INTRAMUSCULAR | Status: AC
  Administered 2015-04-29: 4 mg via INTRAVENOUS
  Filled 2015-04-29: qty 2

## 2015-04-29 MED ORDER — IOHEXOL 300 MG/ML  SOLN
25.0000 mL | Freq: Once | INTRAMUSCULAR | Status: AC | PRN
  Administered 2015-04-29: 25 mL via ORAL

## 2015-04-29 MED ORDER — SODIUM CHLORIDE 0.9 % IV BOLUS (SEPSIS)
1000.0000 mL | Freq: Once | INTRAVENOUS | Status: AC
  Administered 2015-04-29: 1000 mL via INTRAVENOUS

## 2015-04-29 MED ORDER — SODIUM CHLORIDE 0.9 % IV SOLN
INTRAVENOUS | Status: DC
  Administered 2015-04-29: 12:00:00 via INTRAVENOUS

## 2015-04-29 NOTE — ED Provider Notes (Signed)
CSN: [id number]     Arrival date & time 04/29/15  1051 History   First MD Initiated Contact with Patient 04/29/15 1127     Chief Complaint  Patient presents with  . Abdominal Pain     (Consider location/radiation/quality/duration/timing/severity/associated sxs/prior Treatment) Patient is a 29 y.o. female presenting with abdominal pain. The history is provided by the patient.  Abdominal Pain Associated symptoms: no chest pain, no diarrhea, no dysuria, no fever, no hematuria, no nausea, no shortness of breath, no vaginal discharge and no vomiting    patient with onset of abdominal pain right lower quadrant starting on Sunday. Patient states that her abdomen gets distended as well. Is not currently distended. States the pain is 8 out of 10. Patient states this is been an ongoing problem on and off for the past several months. Patient was evaluated in the emergency department in April for as well as June. Workups in the past have been negative. CT scan in April showed evidence of an umbilical hernia without any complicating factors. Patient's menstrual periods have been irregular.  Past Medical History  Diagnosis Date  . Asthma   . Bipolar 1 disorder   . Anxiety   . Endometriosis   . Mental disorder   . Depression    Past Surgical History  Procedure Laterality Date  . Cholecystectomy    . Tubal ligation    . Abdominal surgery      "For endometriosis"  . Oophorectomy Right 2012   Family History  Problem Relation Age of Onset  . Stroke Mother   . Hyperlipidemia Other    Social History  Substance Use Topics  . Smoking status: Current Every Day Smoker -- 0.20 packs/day for 13 years    Types: Cigarettes  . Smokeless tobacco: None  . Alcohol Use: Yes     Comment: Drinks once a month   OB History    Gravida Para Term Preterm AB TAB SAB Ectopic Multiple Living   4 2 2  0 2 0 2 0 0 2     Review of Systems  Constitutional: Negative for fever.  HENT: Negative for congestion.    Eyes: Negative for redness.  Respiratory: Negative for shortness of breath.   Cardiovascular: Negative for chest pain.  Gastrointestinal: Positive for abdominal pain and abdominal distention. Negative for nausea, vomiting, diarrhea and blood in stool.  Genitourinary: Negative for dysuria, hematuria and vaginal discharge.  Musculoskeletal: Negative for back pain.  Skin: Negative for rash.  Neurological: Negative for headaches.  Hematological: Does not bruise/bleed easily.  Psychiatric/Behavioral: Negative for confusion.      Allergies  Coconut flavor and Bee venom  Home Medications   Prior to Admission medications   Medication Sig Start Date End Date Taking? Authorizing Provider  albuterol (PROVENTIL HFA;VENTOLIN HFA) 108 (90 BASE) MCG/ACT inhaler Inhale 1-2 puffs into the lungs every 6 (six) hours as needed for wheezing or shortness of breath.   Yes Historical Provider, MD  aspirin-acetaminophen-caffeine (EXCEDRIN MIGRAINE) [phone] MG per tablet Take 1 tablet by mouth every 6 (six) hours as needed for headache.   Yes Historical Provider, MD  norgestimate-ethinyl estradiol (SPRINTEC 28) 0.25-35 MG-MCG tablet Take 1 tablet by mouth daily. 01/15/15  Yes Peggy Constant, MD  naproxen (NAPROSYN) 500 MG tablet Take 1 tablet (500 mg total) by mouth 2 (two) times daily. 04/29/15   Vanetta Mulders, MD  pantoprazole (PROTONIX) 20 MG tablet Take 1 tablet (20 mg total) by mouth daily. Patient not taking: Reported on 04/29/2015 01/06/15  Tatyana Kirichenko, PA-C   BP 121/84 mmHg  Pulse 59  Temp(Src) 99.7 F (37.6 C) (Oral)  Resp 18  Ht 5\' 2"  (1.575 m)  Wt 170 lb (77.111 kg)  BMI 31.09 kg/m2  SpO2 99%  LMP 04/23/2015 (Exact Date) Physical Exam  Constitutional: She is oriented to person, place, and time. She appears well-developed and well-nourished. No distress.  HENT:  Head: Normocephalic and atraumatic.  Eyes: Conjunctivae and EOM are normal. Pupils are equal, round, and reactive to  light.  Neck: Normal range of motion.  Cardiovascular: Normal rate, regular rhythm and normal heart sounds.   No murmur heard. Pulmonary/Chest: Effort normal and breath sounds normal. No respiratory distress.  Abdominal: Soft. Bowel sounds are normal. She exhibits no distension. There is tenderness.  Mild tenderness right lower quadrant. No guarding.  Musculoskeletal: Normal range of motion. She exhibits no edema.  Neurological: She is alert and oriented to person, place, and time. No cranial nerve deficit. She exhibits normal muscle tone. Coordination normal.  Skin: Skin is warm. No rash noted.  Nursing note and vitals reviewed.   ED Course  Procedures (including critical care time) Labs Review Labs Reviewed  LIPASE, BLOOD - Abnormal; Notable for the following:    Lipase 15 (*)    All other components within normal limits  COMPREHENSIVE METABOLIC PANEL - Abnormal; Notable for the following:    Calcium 8.8 (*)    Total Protein 6.0 (*)    Albumin 3.4 (*)    All other components within normal limits  CBC  URINALYSIS, ROUTINE W REFLEX MICROSCOPIC (NOT AT [facility])  POC URINE PREG, ED   Results for orders placed or performed during the hospital encounter of 04/29/15  Lipase, blood  Result Value Ref Range   Lipase 15 (L) 22 - 51 U/L  Comprehensive metabolic panel  Result Value Ref Range   Sodium 137 135 - 145 mmol/L   Potassium 3.8 3.5 - 5.1 mmol/L   Chloride 109 101 - 111 mmol/L   CO2 22 22 - 32 mmol/L   Glucose, Bld 94 65 - 99 mg/dL   BUN 6 6 - 20 mg/dL   Creatinine, Ser 1.61 0.44 - 1.00 mg/dL   Calcium 8.8 (L) 8.9 - 10.3 mg/dL   Total Protein 6.0 (L) 6.5 - 8.1 g/dL   Albumin 3.4 (L) 3.5 - 5.0 g/dL   AST 18 15 - 41 U/L   ALT 17 14 - 54 U/L   Alkaline Phosphatase 56 38 - 126 U/L   Total Bilirubin 0.4 0.3 - 1.2 mg/dL   GFR calc non Af Amer >60 >60 mL/min   GFR calc Af Amer >60 >60 mL/min   Anion gap 6 5 - 15  CBC  Result Value Ref Range   WBC 4.0 4.0 - 10.5 K/uL   RBC  4.22 3.87 - 5.11 MIL/uL   Hemoglobin 13.4 12.0 - 15.0 g/dL   HCT 09.6 04.5 - 40.9 %   MCV 93.6 78.0 - 100.0 fL   MCH 31.8 26.0 - 34.0 pg   MCHC 33.9 30.0 - 36.0 g/dL   RDW 81.1 91.4 - 78.2 %   Platelets 180 150 - 400 K/uL  Urinalysis, Routine w reflex microscopic (not at [facility])  Result Value Ref Range   Color, Urine YELLOW YELLOW   APPearance CLEAR CLEAR   Specific Gravity, Urine 1.026 1.005 - 1.030   pH 6.0 5.0 - 8.0   Glucose, UA NEGATIVE NEGATIVE mg/dL   Hgb urine dipstick NEGATIVE  NEGATIVE   Bilirubin Urine NEGATIVE NEGATIVE   Ketones, ur NEGATIVE NEGATIVE mg/dL   Protein, ur NEGATIVE NEGATIVE mg/dL   Urobilinogen, UA 0.2 0.0 - 1.0 mg/dL   Nitrite NEGATIVE NEGATIVE   Leukocytes, UA NEGATIVE NEGATIVE  POC urine preg, ED (not at [facility])  Result Value Ref Range   Preg Test, Ur NEGATIVE NEGATIVE     Imaging Review Ct Abdomen Pelvis W Contrast  04/29/2015   CLINICAL DATA:  Right-sided abdominal pain  EXAM: CT ABDOMEN AND PELVIS WITH CONTRAST  TECHNIQUE: Multidetector CT imaging of the abdomen and pelvis was performed using the standard protocol following bolus administration of intravenous contrast.  CONTRAST:  OMNIPAQUE IOHEXOL 300 MG/ML  SOLN  COMPARISON:  CT abdomen pelvis 01/06/2015  FINDINGS: Lower chest:  Clear.  Normal heart size.  Hepatobiliary: Normal liver and bile ducts. Gallbladder surgically absent.  Pancreas: Negative  Spleen: Calcified granulomata in the spleen.  No liver lesion.  Adrenals/Urinary Tract: Normal kidneys. No renal mass or obstruction. No renal calculi. Normal urinary bladder.  Stomach/Bowel: Negative for bowel obstruction. Normal appendix. Sigmoid diverticulosis without evidence of diverticulitis.  Vascular/Lymphatic: Normal aorta and IVC.  Reproductive: 10 x 20 mm hypodense lesion to the left of the uterus may represent the left ovary. Uterus normal in size. No adnexal mass.  Other: Negative for free fluid.  No adenopathy or mass.  Musculoskeletal:  Negative  IMPRESSION: No acute abnormality.  Normal appendix.   Electronically Signed   By: Marlan Palau M.D.   On: 04/29/2015 14:55   I have personally reviewed and evaluated these images and lab results as part of my medical decision-making.   EKG Interpretation None      MDM   Final diagnoses:  Right lower quadrant abdominal pain    Patient with history of a abdominal pain and lower abdominal pain on and off for several months. Current episode started on Sunday. Patient with extensive workup in June and in April with a CT of the abdomen that was negative. Patient states pain is predominant right-sided. Workup here today negative pregnancy test negative urine normal CT abdomen pelvis without any acute findings. No leukocytosis no liver function test abnormalities lipase is normal. No evidence of pancreatitis. Symptoms may be pelvic in nature and recommend initial follow-up with GYN at [facility]. Will treat with Naprosyn in the meantime.    Vanetta Mulders, MD 04/29/15 1524

## 2015-04-29 NOTE — ED Notes (Signed)
Pt presents from home via POV with c/o generalized abdominal pain worse on the right side.  Pt reports her stomach is "3 times the size it normally is" and reports last night that her stomach had a diffuse red rash that is no longer present.  PT reports right side of abdomen feels "harder" than the left. Pt denies NVD.  Reports her menses occurred last week but was significantly lighter than normal.

## 2015-04-29 NOTE — Discharge Instructions (Signed)
Workup here today negative labs negative CT of abdomen pelvis. Would [facility] the lower abdominal pain. If they are unable to come up with any specific diagnosis he may require follow-up with GI medicine.  Return for any new or worse symptoms.

## 2015-04-29 NOTE — ED Notes (Signed)
Pt is in stable condition upon d/c and is escorted from ED via wheelchair. 

## 2015-10-17 ENCOUNTER — Encounter (HOSPITAL_COMMUNITY): Payer: Self-pay | Admitting: *Deleted

## 2015-10-17 ENCOUNTER — Inpatient Hospital Stay (HOSPITAL_COMMUNITY)
Admission: AD | Admit: 2015-10-17 | Discharge: 2015-10-17 | Disposition: A | Discharge status: Home / Self Care | Payer: Self-pay | Source: Ambulatory Visit | Attending: Obstetrics & Gynecology | Admitting: Obstetrics & Gynecology

## 2015-10-17 DIAGNOSIS — N946 Dysmenorrhea, unspecified: Secondary | ICD-10-CM | POA: Insufficient documentation

## 2015-10-17 DIAGNOSIS — J45909 Unspecified asthma, uncomplicated: Secondary | ICD-10-CM | POA: Insufficient documentation

## 2015-10-17 DIAGNOSIS — F17210 Nicotine dependence, cigarettes, uncomplicated: Secondary | ICD-10-CM | POA: Insufficient documentation

## 2015-10-17 DIAGNOSIS — Z91030 Bee allergy status: Secondary | ICD-10-CM | POA: Insufficient documentation

## 2015-10-17 DIAGNOSIS — Z3202 Encounter for pregnancy test, result negative: Secondary | ICD-10-CM | POA: Insufficient documentation

## 2015-10-17 DIAGNOSIS — F319 Bipolar disorder, unspecified: Secondary | ICD-10-CM | POA: Insufficient documentation

## 2015-10-17 DIAGNOSIS — Z91018 Allergy to other foods: Secondary | ICD-10-CM | POA: Insufficient documentation

## 2015-10-17 DIAGNOSIS — R1030 Lower abdominal pain, unspecified: Secondary | ICD-10-CM | POA: Insufficient documentation

## 2015-10-17 LAB — CBC WITH DIFFERENTIAL/PLATELET
BASOS ABS: 0.1 10*3/uL (ref 0.0–0.1)
BASOS PCT: 1 %
EOS PCT: 2 %
Eosinophils Absolute: 0.1 10*3/uL (ref 0.0–0.7)
HEMATOCRIT: 41.2 % (ref 36.0–46.0)
Hemoglobin: 14 g/dL (ref 12.0–15.0)
LYMPHS PCT: 47 %
Lymphs Abs: 2.6 10*3/uL (ref 0.7–4.0)
MCH: 31.5 pg (ref 26.0–34.0)
MCHC: 34 g/dL (ref 30.0–36.0)
MCV: 92.8 fL (ref 78.0–100.0)
MONO ABS: 0.2 10*3/uL (ref 0.1–1.0)
MONOS PCT: 3 %
Neutro Abs: 2.5 10*3/uL (ref 1.7–7.7)
Neutrophils Relative %: 47 %
PLATELETS: 196 10*3/uL (ref 150–400)
RBC: 4.44 MIL/uL (ref 3.87–5.11)
RDW: 13.3 % (ref 11.5–15.5)
WBC: 5.4 10*3/uL (ref 4.0–10.5)

## 2015-10-17 LAB — WET PREP, GENITAL
CLUE CELLS WET PREP: NONE SEEN
Sperm: NONE SEEN
Trich, Wet Prep: NONE SEEN
Yeast Wet Prep HPF POC: NONE SEEN

## 2015-10-17 LAB — POCT PREGNANCY, URINE: PREG TEST UR: NEGATIVE

## 2015-10-17 MED ORDER — OXYCODONE-ACETAMINOPHEN 5-325 MG PO TABS: 1.0000 | ORAL_TABLET | Freq: Four times a day (QID) | ORAL | Status: DC | PRN

## 2015-10-17 MED ORDER — IBUPROFEN 600 MG PO TABS: 600.0000 mg | ORAL_TABLET | Freq: Four times a day (QID) | ORAL | Status: DC | PRN

## 2015-10-17 NOTE — MAU Provider Note (Signed)
History     CSN: [id number]  Arrival date and time: 10/17/15 1509   First Provider Initiated Contact with Patient 10/17/15 1702      Chief Complaint  Patient presents with  . Vaginal Bleeding   HPI [name] is a 30 y.o. [id number] who presents to MAU today with complaint of vaginal bleeding and clots. The patient states that her period started on Wednesday. She has had brown blood mostly and some scant red blood. She states that the last few months her periods have been very short and light. She has had BTL and is on OCPs for bleeding control due to her history of endometriosis. She states associated lower abdominal pain that is moderate since the bleeding started. She is concerned because when she woke up today she noted a small clot and again after a nap she noted multiple small clots. She denies heavy bleeding today. She also seems concerned about possible STDs.   OB History    Gravida Para Term Preterm AB TAB SAB Ectopic Multiple Living   0 2 0 2 0 0 2      Past Medical History  Diagnosis Date  . Asthma   . Bipolar 1 disorder (HCC)   . Anxiety   . Endometriosis   . Mental disorder   . Depression     Past Surgical History  Procedure Laterality Date  . Cholecystectomy    . Tubal ligation    . Abdominal surgery      "For endometriosis"  . Oophorectomy Right 2012    Family History  Problem Relation Age of Onset  . Stroke Mother   . Hyperlipidemia Other     Social History  Substance Use Topics  . Smoking status: Current Every Day Smoker -- 0.20 packs/day for 13 years    Types: Cigarettes  . Smokeless tobacco: None  . Alcohol Use: Yes     Comment: Drinks once a month    Allergies:  Allergies  Allergen Reactions  . Coconut Flavor Anaphylaxis  . Bee Venom Swelling    No prescriptions prior to admission    Review of Systems  Constitutional: Negative for fever and malaise/fatigue.  Gastrointestinal: Positive for abdominal pain. Negative  for nausea, vomiting, diarrhea and constipation.  Genitourinary: Negative for dysuria.       + vaginal bleeding   Physical Exam   Blood pressure 120/97, pulse 73, temperature 98.3 F (36.8 C), temperature source Oral, resp. rate 16, height  (1.6 m), weight 174 lb 12.8 oz (79.289 kg), last menstrual period 10/15/2015.  Physical Exam  Nursing note and vitals reviewed. Constitutional: She is oriented to person, place, and time. She appears well-developed and well-nourished. No distress.  HENT:  Head: Normocephalic and atraumatic.  Cardiovascular: Normal rate.   Respiratory: Effort normal.  GI: Soft. She exhibits no distension and no mass. There is no tenderness. There is no rebound and no guarding.  Genitourinary: Uterus is not enlarged and not tender. Cervix exhibits no motion tenderness, no discharge and no friability. Right adnexum displays no mass and no tenderness. Left adnexum displays no mass and no tenderness. There is bleeding (scant dark brown blood noted) in the vagina. No vaginal discharge found.  Neurological: She is alert and oriented to person, place, and time.  Skin: Skin is warm and dry. No erythema.  Psychiatric: She has a normal mood and affect.    Results for orders placed or performed during the hospital encounter of 10/17/15 (  from the past 24 hour(s))  Pregnancy, urine POC     Status: None   Collection Time: 10/17/15  3:47 PM  Result Value Ref Range   Preg Test, Ur NEGATIVE NEGATIVE  Wet prep, genital     Status: Abnormal   Collection Time: 10/17/15  5:13 PM  Result Value Ref Range   Yeast Wet Prep HPF POC NONE SEEN NONE SEEN   Trich, Wet Prep NONE SEEN NONE SEEN   Clue Cells Wet Prep HPF POC NONE SEEN NONE SEEN   WBC, Wet Prep HPF POC MODERATE (A) NONE SEEN   Sperm NONE SEEN   CBC with Differential/Platelet     Status: None   Collection Time: 10/17/15  5:45 PM  Result Value Ref Range   WBC 5.4 4.0 - 10.5 K/uL   RBC 4.44 3.87 - 5.11 MIL/uL   Hemoglobin  14.0 12.0 - 15.0 g/dL   HCT 16.1 09.6 - 04.5 %   MCV 92.8 78.0 - 100.0 fL   MCH 31.5 26.0 - 34.0 pg   MCHC 34.0 30.0 - 36.0 g/dL   RDW 40.9 81.1 - 91.4 %   Platelets 196 150 - 400 K/uL   Neutrophils Relative % 47 %   Neutro Abs 2.5 1.7 - 7.7 K/uL   Lymphocytes Relative 47 %   Lymphs Abs 2.6 0.7 - 4.0 K/uL   Monocytes Relative 3 %   Monocytes Absolute 0.2 0.1 - 1.0 K/uL   Eosinophils Relative 2 %   Eosinophils Absolute 0.1 0.0 - 0.7 K/uL   Basophils Relative 1 %   Basophils Absolute 0.1 0.0 - 0.1 K/uL    MAU Course  Procedures None  MDM UPT - negative CBC, wet prep and GC/Chlamydia today Hemodynamically stable today  Patient mentioned concern about possible return of endometriosis. I advised that I did not think this was likely, but she could make an appointment to discuss options with MD in [facility] if desired and symptoms persist.  Assessment and Plan  A: Dysmenorrhea  P: Discharge home Rx for Ibuprofen and Percocet given  Bleeding precautions discussed Patient advised to follow-up with [facility] if symtpoms persist or worsen Patient may return to MAU as needed or if her condition were to change or worsen   Marny Lowenstein, PA-C  10/17/2015, 6:32 PM

## 2015-10-17 NOTE — MAU Note (Signed)
Patient presents with history of irregular periods for 3 to 4 months, vaginal bleeding is very dark, this morning had a blood clot the size of a quarter, on BCP, started period on Wednesday, little bit of bleeding after passing blood clot, abdominal pain.

## 2015-10-17 NOTE — MAU Note (Signed)
Urine in lab 

## 2015-10-17 NOTE — Discharge Instructions (Signed)
Dysmenorrhea Dysmenorrhea is pain during a menstrual period. You will have pain in the lower belly (abdomen). The pain is caused by the tightening (contracting) of the muscles of the uterus. The pain can be minor or severe. Headache, feeling sick to your stomach (nausea), throwing up (vomiting), or low back pain may occur with this condition. HOME CARE  Only take medicine as told by your doctor.  Place a heating pad or hot water bottle on your lower back or belly. Do not sleep with a heating pad.  Exercise may help lessen the pain.  Massage the lower back or belly.  Stop smoking.  Avoid alcohol and caffeine. GET HELP IF:   Your pain does not get better with medicine.  You have pain during sex.  Your pain gets worse while taking pain medicine.  Your period bleeding is heavier than normal.  You keep feeling sick to your stomach or keep throwing up. GET HELP RIGHT AWAY IF: You pass out (faint).   This information is not intended to replace advice given to you by your health care provider. Make sure you discuss any questions you have with your health care provider.   Document Released: 11/12/2008 Document Revised: 08/21/2013 Document Reviewed: 02/01/2013 Elsevier Interactive Patient Education 2016 [employer]  

## 2015-10-20 LAB — GC/CHLAMYDIA PROBE AMP (~~LOC~~) NOT AT ARMC
CHLAMYDIA, DNA PROBE: NEGATIVE
Neisseria Gonorrhea: NEGATIVE

## 2015-11-01 ENCOUNTER — Inpatient Hospital Stay (HOSPITAL_COMMUNITY)
Admission: AD | Admit: 2015-11-01 | Discharge: 2015-11-01 | Disposition: A | Discharge status: Home / Self Care | Payer: Self-pay | Source: Ambulatory Visit | Attending: Family Medicine | Admitting: Family Medicine

## 2015-11-01 ENCOUNTER — Inpatient Hospital Stay (HOSPITAL_COMMUNITY): Payer: Self-pay

## 2015-11-01 ENCOUNTER — Encounter (HOSPITAL_COMMUNITY): Payer: Self-pay | Admitting: *Deleted

## 2015-11-01 DIAGNOSIS — Z9851 Tubal ligation status: Secondary | ICD-10-CM | POA: Insufficient documentation

## 2015-11-01 DIAGNOSIS — F17210 Nicotine dependence, cigarettes, uncomplicated: Secondary | ICD-10-CM | POA: Insufficient documentation

## 2015-11-01 DIAGNOSIS — F319 Bipolar disorder, unspecified: Secondary | ICD-10-CM | POA: Insufficient documentation

## 2015-11-01 DIAGNOSIS — R102 Pelvic and perineal pain: Secondary | ICD-10-CM

## 2015-11-01 DIAGNOSIS — J45909 Unspecified asthma, uncomplicated: Secondary | ICD-10-CM | POA: Insufficient documentation

## 2015-11-01 DIAGNOSIS — Z9049 Acquired absence of other specified parts of digestive tract: Secondary | ICD-10-CM | POA: Insufficient documentation

## 2015-11-01 DIAGNOSIS — R109 Unspecified abdominal pain: Secondary | ICD-10-CM | POA: Insufficient documentation

## 2015-11-01 LAB — CBC WITH DIFFERENTIAL/PLATELET
Basophils Absolute: 0 10*3/uL (ref 0.0–0.1)
Basophils Relative: 1 %
EOS PCT: 1 %
Eosinophils Absolute: 0.1 10*3/uL (ref 0.0–0.7)
HEMATOCRIT: 39.3 % (ref 36.0–46.0)
Hemoglobin: 13.8 g/dL (ref 12.0–15.0)
LYMPHS ABS: 1.9 10*3/uL (ref 0.7–4.0)
LYMPHS PCT: 34 %
MCH: 32 pg (ref 26.0–34.0)
MCHC: 35.1 g/dL (ref 30.0–36.0)
MCV: 91.2 fL (ref 78.0–100.0)
MONO ABS: 0.1 10*3/uL (ref 0.1–1.0)
Monocytes Relative: 2 %
Neutro Abs: 3.6 10*3/uL (ref 1.7–7.7)
Neutrophils Relative %: 62 %
PLATELETS: 166 10*3/uL (ref 150–400)
RBC: 4.31 MIL/uL (ref 3.87–5.11)
RDW: 13 % (ref 11.5–15.5)
WBC: 5.7 10*3/uL (ref 4.0–10.5)

## 2015-11-01 LAB — URINALYSIS, ROUTINE W REFLEX MICROSCOPIC
GLUCOSE, UA: NEGATIVE mg/dL
HGB URINE DIPSTICK: NEGATIVE
Ketones, ur: NEGATIVE mg/dL
Leukocytes, UA: NEGATIVE
Nitrite: NEGATIVE
PH: 5.5 (ref 5.0–8.0)
Protein, ur: NEGATIVE mg/dL

## 2015-11-01 LAB — COMPREHENSIVE METABOLIC PANEL
ALBUMIN: 3.8 g/dL (ref 3.5–5.0)
ALT: 15 U/L (ref 14–54)
AST: 16 U/L (ref 15–41)
Alkaline Phosphatase: 58 U/L (ref 38–126)
Anion gap: 8 (ref 5–15)
BILIRUBIN TOTAL: 0.6 mg/dL (ref 0.3–1.2)
BUN: 11 mg/dL (ref 6–20)
CHLORIDE: 108 mmol/L (ref 101–111)
CO2: 21 mmol/L — ABNORMAL LOW (ref 22–32)
Calcium: 8.6 mg/dL — ABNORMAL LOW (ref 8.9–10.3)
Creatinine, Ser: 0.71 mg/dL (ref 0.44–1.00)
GFR calc Af Amer: >60 mL/min (ref >60)
GFR calc non Af Amer: >60 mL/min (ref >60)
GLUCOSE: 90 mg/dL (ref 65–99)
POTASSIUM: 3.9 mmol/L (ref 3.5–5.1)
Sodium: 137 mmol/L (ref 135–145)
TOTAL PROTEIN: 7.3 g/dL (ref 6.5–8.1)

## 2015-11-01 LAB — LIPASE, BLOOD: Lipase: 19 U/L (ref 11–51)

## 2015-11-01 LAB — POCT PREGNANCY, URINE: Preg Test, Ur: NEGATIVE

## 2015-11-01 NOTE — Discharge Instructions (Signed)

## 2015-11-01 NOTE — MAU Note (Addendum)
Pt states has a history of endometriosis.  Pt states has lower abdominal pain 10/10 for the past 3 days.  States nauseous from pain.  Pt states that cannot take ibuprofen or acetaminophen because it makes her thow up or nauseous.

## 2015-11-01 NOTE — MAU Provider Note (Signed)
MAU HISTORY AND PHYSICAL  Chief Complaint:  Abdominal Pain   [name] is a 30 y.o.  [id number] with IUP at Unknown presenting for pain.  Dealing with this pain for at least a year. Getting worse. Pain is low abdomen/pelvis. Constant at times, on and off at other times. Sometimes abdominal distention. Sometimes nausea. No vomiting, no diarrhea. No fevers. Two unremarkable CTs of abdomen/pelvis within past year. Has 3/15 gyn appointment. Grandmother history ovarian cancer. Is s/p right oophorectomy. No upper abdominal pain. Pain significantly worse over past 3 days. No vaginal bleeding. Is s/p btl and is taking OCPs for dysmenorrhea. Is s/p cholecystectomy. Also complains of pain w/ deep penetration w/ sex. Patient's grandmother is worried she has ovarian cancer.    Past Medical History  Diagnosis Date  . Asthma   . Bipolar 1 disorder (HCC)   . Anxiety   . Endometriosis   . Mental disorder   . Depression     Past Surgical History  Procedure Laterality Date  . Cholecystectomy    . Tubal ligation    . Abdominal surgery      "For endometriosis"  . Oophorectomy Right 2012    Family History  Problem Relation Age of Onset  . Stroke Mother   . Hyperlipidemia Other     Social History  Substance Use Topics  . Smoking status: Current Every Day Smoker -- 0.20 packs/day for 13 years    Types: Cigarettes  . Smokeless tobacco: None  . Alcohol Use: Yes     Comment: Drinks once a month    Allergies  Allergen Reactions  . Coconut Flavor Anaphylaxis  . Bee Venom Swelling    Prescriptions prior to admission  Medication Sig Dispense Refill Last Dose  . Calcium Citrate-Vitamin D (CALCIUM + D PO) Take 1 tablet by mouth 2 (two) times daily.   11/01/2015 at Unknown time  . norgestimate-ethinyl estradiol (SPRINTEC 28) 0.25-35 MG-MCG tablet Take 1 tablet by mouth daily. 1 Package 12 10/31/2015 at Unknown time  . ranitidine (ZANTAC) 150 MG tablet Take 150 mg by mouth at bedtime.   prn   . albuterol (PROVENTIL HFA;VENTOLIN HFA) 108 (90 BASE) MCG/ACT inhaler Inhale 1-2 puffs into the lungs every 6 (six) hours as needed for wheezing or shortness of breath.   prn  . aspirin-acetaminophen-caffeine (EXCEDRIN MIGRAINE) 250-250-65 MG per tablet Take 1 tablet by mouth every 6 (six) hours as needed for headache.   prn  . ibuprofen (ADVIL,MOTRIN) 600 MG tablet Take 1 tablet (600 mg total) by mouth every 6 (six) hours as needed. (Patient not taking: Reported on 11/01/2015) 30 tablet 0 Not Taking at Unknown time  . oxyCODONE-acetaminophen (PERCOCET/ROXICET) 5-325 MG tablet Take 1 tablet by mouth every 6 (six) hours as needed for severe pain. (Patient not taking: Reported on 11/01/2015) 6 tablet 0 Not Taking at Unknown time    Review of Systems - Negative except for what is mentioned in HPI.  Physical Exam  Blood pressure 129/82, pulse 71, temperature 98.3 F (36.8 C), temperature source Oral, resp. rate 18, height  (1.6 m), last menstrual period 10/15/2015, SpO2 100 %. GENERAL: Well-developed, well-nourished female in mild distress LUNGS: Clear to auscultation bilaterally.  HEART: Regular rate and rhythm. ABDOMEN: Soft diffuse ttp lower quadrants EXTREMITIES: Nontender, no edema, 2+ distal pulses. Cervical Exam: no cmt, mild adnexal tenderness bilaterally    Labs: Results for orders placed or performed during the hospital encounter of 11/01/15 (from the past 24 hour(s))  Urinalysis,  Routine w reflex microscopic (not at [facility])   Collection Time: 11/01/15 12:20 PM  Result Value Ref Range   Color, Urine YELLOW YELLOW   APPearance CLEAR CLEAR   Specific Gravity, Urine >1.030 (H) 1.005 - 1.030   pH 5.5 5.0 - 8.0   Glucose, UA NEGATIVE NEGATIVE mg/dL   Hgb urine dipstick NEGATIVE NEGATIVE   Bilirubin Urine SMALL (A) NEGATIVE   Ketones, ur NEGATIVE NEGATIVE mg/dL   Protein, ur NEGATIVE NEGATIVE mg/dL   Nitrite NEGATIVE NEGATIVE   Leukocytes, UA NEGATIVE NEGATIVE  Pregnancy, urine  POC   Collection Time: 11/01/15 12:50 PM  Result Value Ref Range   Preg Test, Ur NEGATIVE NEGATIVE  CBC with Differential/Platelet   Collection Time: 11/01/15  1:09 PM  Result Value Ref Range   WBC 5.7 4.0 - 10.5 K/uL   RBC 4.31 3.87 - 5.11 MIL/uL   Hemoglobin 13.8 12.0 - 15.0 g/dL   HCT 16.1 09.6 - 04.5 %   MCV 91.2 78.0 - 100.0 fL   MCH 32.0 26.0 - 34.0 pg   MCHC 35.1 30.0 - 36.0 g/dL   RDW 40.9 81.1 - 91.4 %   Platelets 166 150 - 400 K/uL   Neutrophils Relative % 62 %   Neutro Abs 3.6 1.7 - 7.7 K/uL   Lymphocytes Relative 34 %   Lymphs Abs 1.9 0.7 - 4.0 K/uL   Monocytes Relative 2 %   Monocytes Absolute 0.1 0.1 - 1.0 K/uL   Eosinophils Relative 1 %   Eosinophils Absolute 0.1 0.0 - 0.7 K/uL   Basophils Relative 1 %   Basophils Absolute 0.0 0.0 - 0.1 K/uL  Comprehensive metabolic panel   Collection Time: 11/01/15  1:09 PM  Result Value Ref Range   Sodium 137 135 - 145 mmol/L   Potassium 3.9 3.5 - 5.1 mmol/L   Chloride 108 101 - 111 mmol/L   CO2 21 (L) 22 - 32 mmol/L   Glucose, Bld 90 65 - 99 mg/dL   BUN 11 6 - 20 mg/dL   Creatinine, Ser 7.82 0.44 - 1.00 mg/dL   Calcium 8.6 (L) 8.9 - 10.3 mg/dL   Total Protein 7.3 6.5 - 8.1 g/dL   Albumin 3.8 3.5 - 5.0 g/dL   AST 16 15 - 41 U/L   ALT 15 14 - 54 U/L   Alkaline Phosphatase 58 38 - 126 U/L   Total Bilirubin 0.6 0.3 - 1.2 mg/dL   GFR calc non Af Amer >60 >60 mL/min   GFR calc Af Amer >60 >60 mL/min   Anion gap 8 5 - 15  Lipase, blood   Collection Time: 11/01/15  1:09 PM  Result Value Ref Range   Lipase 19 11 - 51 U/L    Imaging Studies:  US Transvaginal Non-ob  11/01/2015  CLINICAL DATA:  Pelvic pain. EXAM: TRANSABDOMINAL AND TRANSVAGINAL ULTRASOUND OF PELVIS TECHNIQUE: Both transabdominal and transvaginal ultrasound examinations of the pelvis were performed. Transabdominal technique was performed for global imaging of the pelvis including uterus, ovaries, adnexal regions, and pelvic cul-de-sac. It was necessary to  proceed with endovaginal exam following the transabdominal exam to visualize the uterus, ovaries, and adnexa. COMPARISON:  CT of 04/29/2015. FINDINGS: Uterus Measurements: 8.2 x 3.8 x 4.5 cm. No fibroids or other mass visualized. Endometrium Thickness: Normal, 5 mm.  No focal abnormality visualized. Right ovary Surgically absent Left ovary Measurements: 2.7 x 2.0 x 2.2 cm. Normal appearance/no adnexal mass. Other findings No abnormal free fluid. IMPRESSION: 1.  No  acute process or explanation for pelvic pain. 2. Status post right oophorectomy. Electronically Signed   By: Jeronimo GreavesKyle  Talbot M.D.   On: 11/01/2015 14:21   [place] Pelvis Complete  11/01/2015  CLINICAL DATA:  Pelvic pain. EXAM: TRANSABDOMINAL AND TRANSVAGINAL ULTRASOUND OF PELVIS TECHNIQUE: Both transabdominal and transvaginal ultrasound examinations of the pelvis were performed. Transabdominal technique was performed for global imaging of the pelvis including uterus, ovaries, adnexal regions, and pelvic cul-de-sac. It was necessary to proceed with endovaginal exam following the transabdominal exam to visualize the uterus, ovaries, and adnexa. COMPARISON:  CT of 04/29/2015. FINDINGS: Uterus Measurements: 8.2 x 3.8 x 4.5 cm. No fibroids or other mass visualized. Endometrium Thickness: Normal, 5 mm.  No focal abnormality visualized. Right ovary Surgically absent Left ovary Measurements: 2.7 x 2.0 x 2.2 cm. Normal appearance/no adnexal mass. Other findings No abnormal free fluid. IMPRESSION: 1.  No acute process or explanation for pelvic pain. 2. Status post right oophorectomy. Electronically Signed   By: Jeronimo GreavesKyle  Talbot M.D.   On: 11/01/2015 14:21    Assessment: [name] is  30 y.o. [id number] presents with Abdominal Pain Etiology not clear but may very well represent recurrence of endometriosis - pain is chronic, bilateral, with pain with deep penetrative sex, and with 2 normal recent CTs and normal u/s today. CMP, lipase, CBC, and urinalysis all  unremarkable today. UPT negative.  Plan: - outpatient gyn f/u 3/15 as scheduled - f/u g/c and ca-125  Cherrie Gauzeoah B [facility] 3/4/20172:32 PM

## 2015-11-02 LAB — CA 125: CA 125: 7.1 U/mL (ref 0.0–38.1)

## 2015-11-03 LAB — GC/CHLAMYDIA PROBE AMP (~~LOC~~) NOT AT ARMC
CHLAMYDIA, DNA PROBE: NEGATIVE
Neisseria Gonorrhea: NEGATIVE

## 2015-11-12 ENCOUNTER — Ambulatory Visit (INDEPENDENT_AMBULATORY_CARE_PROVIDER_SITE_OTHER): Payer: Self-pay | Admitting: Obstetrics & Gynecology

## 2015-11-12 ENCOUNTER — Encounter: Payer: Self-pay | Admitting: Obstetrics & Gynecology

## 2015-11-12 VITALS — BP 134/88 | HR 68 | Temp 98.2°F | Ht 63.0 in | Wt 175.8 lb

## 2015-11-12 DIAGNOSIS — R102 Pelvic and perineal pain: Secondary | ICD-10-CM

## 2015-11-12 DIAGNOSIS — N3010 Interstitial cystitis (chronic) without hematuria: Secondary | ICD-10-CM

## 2015-11-12 NOTE — Progress Notes (Signed)
Patient ID: [name], female   DOB: [birth year], 30 y.o.   MRN: [id number]  Chief Complaint  Patient presents with  . Follow-up  seen in MAU for pelvic pain  HPI [name] is a 30 y.o. female.  [id number] Patient's last menstrual period was 10/15/2015. Regular menses on OCP, last for 2 days with pain first day. She had frequent pain, dyspareunia, frequency, occasional urinary incontinence  HPI  Past Medical History  Diagnosis Date  . Asthma   . Bipolar 1 disorder (HCC)   . Anxiety   . Endometriosis   . Mental disorder   . Depression   . Gestational diabetes   IC dx by urology at [facility] 01/2015, did not follow up as requested  Past Surgical History  Procedure Laterality Date  . Cholecystectomy    . Tubal ligation    . Abdominal surgery      "For endometriosis"  . Oophorectomy Right 2012  dermoid cyst- no endometriosis at [facility]  Family History  Problem Relation Age of Onset  . Stroke Mother   . Hyperlipidemia Other     Social History Social History  Substance Use Topics  . Smoking status: Current Every Day Smoker -- 0.20 packs/day for 13 years    Types: Cigarettes  . Smokeless tobacco: Never Used  . Alcohol Use: Yes     Comment: Drinks once a month    Allergies  Allergen Reactions  . Coconut Flavor Anaphylaxis  . Bee Venom Swelling    Current Outpatient Prescriptions  Medication Sig Dispense Refill  . albuterol (PROVENTIL HFA;VENTOLIN HFA) 108 (90 BASE) MCG/ACT inhaler Inhale 1-2 puffs into the lungs every 6 (six) hours as needed for wheezing or shortness of breath.    Marland Kitchen aspirin-acetaminophen-caffeine (EXCEDRIN MIGRAINE) 250-250-65 MG per tablet Take 1 tablet by mouth every 6 (six) hours as needed for headache.    . Calcium Citrate-Vitamin D (CALCIUM + D PO) Take 1 tablet by mouth 2 (two) times daily.    . divalproex (DEPAKOTE ER) 500 MG 24 hr tablet Take 500 mg by mouth 2 (two) times daily.    . hydrOXYzine (VISTARIL) 25 MG capsule Take 25 mg by  mouth every 8 (eight) hours as needed.    . norgestimate-ethinyl estradiol (SPRINTEC 28) 0.25-35 MG-MCG tablet Take 1 tablet by mouth daily. 1 Package 12  . ranitidine (ZANTAC) 150 MG tablet Take 150 mg by mouth at bedtime.    Marland Kitchen oxyCODONE-acetaminophen (PERCOCET/ROXICET) 5-325 MG tablet Take 1 tablet by mouth every 6 (six) hours as needed for severe pain. (Patient not taking: Reported on 11/01/2015) 6 tablet 0   No current facility-administered medications for this visit.    Review of Systems Review of Systems  Constitutional: Negative.   Genitourinary: Positive for dysuria, urgency, frequency, menstrual problem (pain first day of menses), pelvic pain and dyspareunia. Negative for vaginal bleeding and vaginal discharge.    Blood pressure 134/88, pulse 68, temperature 98.2 F (36.8 C), height 5\' 3"  (1.6 m), weight 175 lb 12.8 oz (79.742 kg), last menstrual period 10/15/2015.  Physical Exam Physical Exam  Constitutional: She is oriented to person, place, and time. She appears well-developed. No distress.  Cardiovascular: Normal rate.   Pulmonary/Chest: Effort normal and breath sounds normal.  Neurological: She is alert and oriented to person, place, and time.  Psychiatric: She has a normal mood and affect. Her behavior is normal.    Data Reviewed Tonita Phoenix, MD - 01/29/2015 2:20 PM EDT Formatting of this note  may be different from the original. Chief Complaint: This patient is seen in consultation for urgency, frequency, abdominal pain  History of Present Illness: [name] is a 30 y.o. female with history of endometriosis and asthma who presents with 1-1.5 mo of recurrent abdominal pain, significant urinary frequency and urgency. She made two trips to the ED and eventually gynecology due to concern for recurrence of endometriosis. At that she had noted some improvement in her pain with administration of OCPs, but was complaining of significant urinary symptoms and was  subsequently referred. Today she is reporting 10/10 lower and upper abdominal pain as well as SP pain. Her urinary symptoms include significant urgency with frequency up to every 5 min. She does note relief of her urge, but not her abdominal pain, with urination. However, she feels the urge again almost immediately. No incontinence, dysuria, hematuria, straining to void. Treated for bacterial vaginosis about three weeks ago, but did not change current symptoms. No known UTIs. She does have dyspareunia, mostly with penetration and denies vulvodynia. She reports diarrhea recently and no pain with defecation. She feels no vaginal bulges. Her liquid intake includes sprite, tea and diet dr. [name]. She does not drink water. This is a recent change as she used to drink primarily coke, but this seems to exacerbate her abdominal.   Past history includes no kidney stones. She had an endometrial ablation 2005 for mild endometriosis per documentation. She had a right oophorectomy in 2011 for a large cyst with no mention of endometriosis recurrence at that time. She also reports a history of asthma and prior diagnosis of bipolar disease, though she is currently not requiring any psychiatric medications. Other surgical history includes cholecystectomy and tubal ligation. She has had two children via uncomplicated vaginal birth. No known family history of stones or GU malignancy. She smokes 2-3 cigarettes per day and drinks alcohol rarely.  Female symptoms score 12 QoL 4 Urgency score 17 Frequency score 7  Ct imaging report from 5/9 reports no GU abnormalities Pelvic US from 5/9 shows surgically absent right ovary, otherwise negative  ROS Constitutional: Patient denies any unintentional weight loss or change in strength Integumentary: Patient denies any rashes or pruritus Eyes: Patient denies double vision or eye pain Ears/Nose/Mouth/Throat: Patient denies any nosebleeds or gum bleeding Cardiovascular: Patient  denies chest pain or syncope Respiratory: Patient denies hemoptysis  Gastrointestinal: Patient denies nausea, vomiting, constipation Musculoskeletal: Patient denies muscle cramps or weakness Neurologic: Patient denies convulsions or seizures Psychiatric: Patient denies hallucinations or suicidal ideations Alleric/Immunologic: Patient denies food allergies Hematologic/Lymphatic: Patient denies bleeding tendencies Endocrine: Patient denies heat/cold intolerance Genitourinary: As per HPI.  Current outpatient prescriptions:  . HYDROcodone-acetaminophen (NORCO) 5-325 mg per tablet, Take 1 tablet by mouth every 6 (six) hours as needed for Pain., Disp: , Rfl:  . norgestimate-ethinyl estradiol (ORTHO TRI-CYCLEN,TRI-SPRINTEC) 0.18/0.215/0.25 mg-35 mcg (28) tablet, Take 1 tablet by mouth daily., Disp: , Rfl:  . pantoprazole (PROTONIX) 20 MG tablet, Take 40 mg by mouth daily., Disp: , Rfl:  . hydrOXYzine HCl (ATARAX) 25 MG tablet, Take 1 tablet (25 mg total) by mouth 2 times daily. Take nightly for 3 nights and increase to twice a day if not too drowsy, Disp: 60 tablet, Rfl: 2 . oxybutynin (DITROPAN) 5 MG tablet, Take 1 tablet (5 mg total) by mouth 3 (three) times daily as needed for up to 10 days (bladder spasm)., Disp: 45 tablet, Rfl: 3  Allergies  Allergen Reactions  . Bee Pollen Anaphylaxis (ALLERGY)  . Coconut  Facial Edema (intolerance)   Past Medical History  Diagnosis Date  . Asthma  . Endometriosis  . Urgency of urination  . Frequency of urination  . Bipolar disorder [facility](HCC)   Past Surgical History  Procedure Laterality Date  . Right oophorectomy 2011  . Endometrial ablation 2005  . Cholecystectomy  . Tubal ligation   Family History  Problem Relation Age of Onset  . GU problems Neg Hx   Social History   Social History  . Marital Status: Single  Spouse Name: N/A  . Number of Children: N/A  . Years of Education: N/A   Occupational History  . Not on file.   Social History  Main Topics  . Smoking status: Current Every Day Smoker -- 0.50 packs/day  Types: Cigarettes  Start date: 01/28/1989  . Smokeless tobacco: Not on file  . Alcohol Use: Not on file  . Drug Use: Not on file  . Sexual Activity: Not on file   Other Topics Concern  . Not on file   Social History Narrative  . No narrative on file   Physical Exam: Filed Vitals:  01/29/15 1344  BP: 132/80  Pulse: 62  Temp: 97.9 F (36.6 C)  Resp: 20   Body mass index is 31.56 kg/(m^2).  Constitutional: No obvious distress Eyes: EOMI Ears/Nose/Mouth/Throat: No obvious drainage per ears or nose Cardiovascular: Pulse regular Respiratory: No audible wheezing/stridor; respirations do not appear labored Gastrointestinal: Abdomen not distended, diffuse generalized mild TTP Musculoskeletal: Normal ROM of UEs Skin: No obvious rashes/open sores Neurologic: CN 2-12 grossly intact Psychiatric: Answered questions appropriately w/normal affect Hematologic/Lymphatic/Immunologic: No obvious bruises or sites of spontaneous bleeding Genitourinary: No CVA tenderness, bladder not palpable. Mild SP tenderness Pelvic: No anterior or posterior prolapse Positive urethral hypermobility, but no leakage on cough stress test Pain with palpation under the trigone Pelvic floor non-painful, no tightness  Data Review Results for orders placed or performed in visit on 01/29/15 (from the past 24 hour(s))  POCT Urinalysis Dip w/o Scope  Collection Time: 01/29/15 2:06 PM  Result Value Ref Range  POC Urine Glucose Negative Negative, Indeterminate, Equivocal, Presumptive Negative, Trace, 1+, 2+, 3+, 4+ mg/dl  POC Urine Bilirubin Trace Negative, Indeterminate, Equivocal, Presumptive Negative, Trace, 1+, 2+, 3+, 4+  POC Urine Ketones Trace Negative, Indeterminate, Equivocal, Presumptive Negative, Trace, 1+, 2+, 3+, 4+  POC Urine Specific Gravity 1.025 1.005 - 1.025  POC Urine Blood Nonhemolyzed Negative Negative, Indeterminate,  Equivocal, Presumptive Negative, Trace, 1+, 2+, 3+, 4+  POC Urine Blood Hemolyzed Positive (A) Negative, Indeterminate, Equivocal, Presumptive Negative, Trace, 1+, 2+, 3+, 4+  POC Urine pH Dipstick 6 5.5 - 6.5  POC Urine Protein Positive (A) Negative, Indeterminate, Equivocal, Presumptive Negative, Trace, 1+, 2+, 3+, 4+  POC Urobilinogen 0.2 0.1 - 1 EU/dl  POC Urine Nitrite Negative Negative, Indeterminate, Equivocal, Presumptive Negative, Trace, 1+, 2+, 3+, 4+  POC Urine Leukocytes Negative Negative, Indeterminate, Equivocal, Presumptive Negative, Trace, 1+, 2+, 3+, 4+    Assessment and plan:  1. Urine frequency POCT Urinalysis Dip w/o Scope  Culture, Urine Urology  2. Urinary urgency   Symptoms do seem consistent with interstitial cystitis, but insufficient time frame for diagnosis at this point. Urine sent for culture Will trial hydroxyzine, provided dietary recommendations Advised to increase water intake and cease all caffeine intake  Return in about 2 months (around 03/31/2015).  Patient seen and discussed with Dr. Myles LippsMirzazadeh.  Tonita PhoenixBrandy Nicole Hood, MD  Electronically signed by: Timothy LassoMajid Mirzazadeh, MD 01/30/15 1003  Assessment    Pelvic pain Interstitial cystitis not compliant with recommended care     Plan    Continue OCP  f/U at [facility] with Timothy Lasso, MD, urology       Coleta Grosshans 11/12/2015, 3:58 PM

## 2015-11-12 NOTE — Patient Instructions (Signed)

## 2016-02-02 ENCOUNTER — Telehealth: Payer: Self-pay | Admitting: *Deleted

## 2016-02-02 DIAGNOSIS — Z3041 Encounter for surveillance of contraceptive pills: Secondary | ICD-10-CM

## 2016-02-02 MED ORDER — NORGESTIMATE-ETH ESTRADIOL 0.25-35 MG-MCG PO TABS: 1.0000 | ORAL_TABLET | Freq: Every day | ORAL | Status: DC

## 2016-02-02 NOTE — Telephone Encounter (Signed)
[name] left a message on voicemail this am wanting to know what she needs to do to get refills for birth control pills. Per chart review has been seen several times in last 2 years. Last refill may 2016 by Dr. Jolayne Pantheronstant.  Called [name] and informed her can refill x 3 months- will need to see provider and annual to get refill for one year. Will have registar call her with appt. She voices understanding.

## 2016-02-16 NOTE — Telephone Encounter (Signed)
Opened in error

## 2016-02-22 ENCOUNTER — Other Ambulatory Visit: Payer: Self-pay

## 2016-02-22 ENCOUNTER — Encounter (HOSPITAL_COMMUNITY): Payer: Self-pay

## 2016-02-22 ENCOUNTER — Emergency Department (HOSPITAL_COMMUNITY)
Admission: EM | Admit: 2016-02-22 | Discharge: 2016-02-22 | Disposition: A | Discharge status: Home / Self Care | Payer: Self-pay | Attending: Emergency Medicine | Admitting: Emergency Medicine

## 2016-02-22 ENCOUNTER — Emergency Department (HOSPITAL_COMMUNITY): Payer: Self-pay

## 2016-02-22 DIAGNOSIS — F17210 Nicotine dependence, cigarettes, uncomplicated: Secondary | ICD-10-CM | POA: Insufficient documentation

## 2016-02-22 DIAGNOSIS — M7989 Other specified soft tissue disorders: Secondary | ICD-10-CM | POA: Insufficient documentation

## 2016-02-22 DIAGNOSIS — J45909 Unspecified asthma, uncomplicated: Secondary | ICD-10-CM | POA: Insufficient documentation

## 2016-02-22 LAB — URINALYSIS, ROUTINE W REFLEX MICROSCOPIC
Bilirubin Urine: NEGATIVE
Glucose, UA: NEGATIVE mg/dL
Hgb urine dipstick: NEGATIVE
Ketones, ur: NEGATIVE mg/dL
LEUKOCYTES UA: NEGATIVE
NITRITE: NEGATIVE
Protein, ur: NEGATIVE mg/dL
SPECIFIC GRAVITY, URINE: 1.009 (ref 1.005–1.030)
pH: 7.5 (ref 5.0–8.0)

## 2016-02-22 LAB — COMPREHENSIVE METABOLIC PANEL
ALBUMIN: 3.2 g/dL — AB (ref 3.5–5.0)
ALT: 10 U/L — ABNORMAL LOW (ref 14–54)
ANION GAP: 6 (ref 5–15)
AST: 14 U/L — ABNORMAL LOW (ref 15–41)
Alkaline Phosphatase: 41 U/L (ref 38–126)
BILIRUBIN TOTAL: 0.2 mg/dL — AB (ref 0.3–1.2)
BUN: <5 mg/dL — ABNORMAL LOW (ref 6–20)
CO2: 24 mmol/L (ref 22–32)
Calcium: 8.7 mg/dL — ABNORMAL LOW (ref 8.9–10.3)
Chloride: 108 mmol/L (ref 101–111)
Creatinine, Ser: 0.77 mg/dL (ref 0.44–1.00)
GFR calc Af Amer: >60 mL/min (ref >60)
GFR calc non Af Amer: >60 mL/min (ref >60)
GLUCOSE: 89 mg/dL (ref 65–99)
POTASSIUM: 3.9 mmol/L (ref 3.5–5.1)
SODIUM: 138 mmol/L (ref 135–145)
TOTAL PROTEIN: 5.9 g/dL — AB (ref 6.5–8.1)

## 2016-02-22 LAB — CBC WITH DIFFERENTIAL/PLATELET
BASOS PCT: 1 %
Basophils Absolute: 0 10*3/uL (ref 0.0–0.1)
Eosinophils Absolute: 0.1 10*3/uL (ref 0.0–0.7)
Eosinophils Relative: 1 %
HEMATOCRIT: 40 % (ref 36.0–46.0)
HEMOGLOBIN: 13.4 g/dL (ref 12.0–15.0)
Lymphocytes Relative: 46 %
Lymphs Abs: 2 10*3/uL (ref 0.7–4.0)
MCH: 32.4 pg (ref 26.0–34.0)
MCHC: 33.5 g/dL (ref 30.0–36.0)
MCV: 96.9 fL (ref 78.0–100.0)
MONOS PCT: 7 %
Monocytes Absolute: 0.3 10*3/uL (ref 0.1–1.0)
NEUTROS ABS: 1.9 10*3/uL (ref 1.7–7.7)
NEUTROS PCT: 45 %
Platelets: 153 10*3/uL (ref 150–400)
RBC: 4.13 MIL/uL (ref 3.87–5.11)
RDW: 12.8 % (ref 11.5–15.5)
WBC: 4.4 10*3/uL (ref 4.0–10.5)

## 2016-02-22 LAB — D-DIMER, QUANTITATIVE: D-Dimer, Quant: 0.46 ug/mL-FEU (ref 0.00–0.50)

## 2016-02-22 LAB — TROPONIN I: Troponin I: <0.03 ng/mL (ref <0.031)

## 2016-02-22 LAB — PREGNANCY, URINE: PREG TEST UR: NEGATIVE

## 2016-02-22 MED ORDER — FUROSEMIDE 20 MG PO TABS
20.0000 mg | ORAL_TABLET | Freq: Once | ORAL | Status: AC
  Administered 2016-02-22: 20 mg via ORAL
  Filled 2016-02-22: qty 1

## 2016-02-22 NOTE — ED Notes (Signed)
Patient transported to X-ray 

## 2016-02-22 NOTE — Discharge Instructions (Signed)
Read the information below.  Your labs are re-assuring.  It is important to wear compression stockings during the day to help with edema. Elevate your legs above your heart at the end of the day. Decrease sodium/salt intake.   Use the prescribed medication as directed.  Please discuss all new medications with your pharmacist.   It is important to follow up with a primary care provider. I have provided the contact information for [facility] and [facility] as well as other local providers in the area. Be sure to call and schedule a follow up appointment.  You may return to the Emergency Department at any time for worsening condition or any new symptoms that concern you. Return to ED if your symptoms worsen, or develop chest pain, shortness of breath, loss of consciousness, or unilateral lower leg swelling and pain.  [employer]stateCommunity Resource Guide Financial Assistance The United Ways 211 is a great source of information about community services available.  Access by dialing 2-1-1 from anywhere in [place], or by website -  PooledIncome.plwww.nc211.org.   Other Local Resources (Updated 08/2015)  Financial Assistance   Services    Phone Number and Address  [facility]  Low-cost medical care - 1st and 3rd Saturday of every month  Must not qualify for public or private insurance and must have limited income [phone] [address] and [place], [place]    [place] Department of Social Services  Child care  Emergency assistance for housing and Kimberly-Clarkutilities  Food stamps  Medicaid [phone] [address] and [place], [place] [address]   [facility]  Low-cost medical care for children, communicable diseases, sexually-transmitted diseases, immunizations, maternity care, womens health and family planning [phone] [address] [facility] [place], [place] [address]  [facility]   Medication  assistance for [facility] residents  Must meet income requirements [phone] [address] [place], [place].    [facility]  Child care  Emergency assistance for housing and Kimberly-Clarkutilities  Food stamps  Medicaid [phone] [address] [place], [place] [address]  [facility]   Low-cost medical care,   Monday through Friday, 9 am to 6 pm.   Accepts Medicare/Medicaid, and self-pay [phone] [address] [place], [place] [address]  [facility]  Low-cost medical care - Monday through Friday, 8:30 am - 5:30 pm  Accepts Medicaid and self-pay [phone] [address] [place], [place] [address]    [facility]  Primary medical care, including for those with sickle cell disease  Accepts Medicare, Medicaid, insurance and self-pay [phone] [address] [place], [place]  [facility]   Primary medical care  Accepts Medicare, [place], insurance and self-pay [phone] [address] [place], [place] [address]   [facility] Department of Social Services  Child care  Emergency assistance for housing and Kimberly-Clarkutilities  Food stamps  Medicaid [phone] [address] [place], [place] [address]  [facility] and CarMaxHuman Services  Child care  Emergency assistance for housing and Kimberly-Clarkutilities  Food stamps  Medicaid [id number] [phone] [place], [place] [address]   [facility] Med[facility]  Medication assistance for [facility] residents with no insurance only  Must have a primary care doctor [phone] [address] [place], [place]  [facility]   Primary medical care  [place] Medicare, [place]icaid, insurance  [phone]  [address] [place], [place]  MedAssist   Medication assistance [phone]  [facility]  [facility]   Primary medical care  Accepts Medicare, [place], insurance and self-pay [phone] [address] [place], [place] [address]  [facility]   Primary medical care  Accepts Medicare, [place], insurance and self-pay [phone] [address] [place], [place] [address]  [facility]  For [place] residents between the ages of 55 and 4 who do not have any form of health insurance, Medicare, [place], or [place] benefits.  Services are provided free of charge to uninsured patients who fall within federal poverty guidelines.    Hours: Tuesdays and Thursdays, 4:15 - 8 pm [phone] [address] [place], [place] [address]  [facility]     Primary medical care  Dental care  Nutritional counseling  Pharmacy  Accepts Medicaid, Medicare, most insurance.  Fees are adjusted based on ability to pay.   [phone] [address] [facility] [address] [place], [place]  [phone] Phineas Real [facility] [address] [place], [place]  [phone] [facility] [place], [place]  [phone] [facility] [place], [address] [place], [place]  [phone] [facility] [address] [place], [place]  [facility]  Womens health and family planning [phone] [place], [place]  [facility] Department of Social Services  Child care  Emergency assistance for housing and Kimberly[employer]ark  Medicaid [phone] [place], [place] [address]   [facility]    Ages 82 and older  Hours: Mondays and Thursdays, 7:00 am - 9:00 am Patients are seen on a first come, first served basis. [phone], [phone] [place], [place]  [facility] Division of Social Services  Child care  Emergency assistance for housing and  Kimberly-Clark  Medicaid [phone] [address] [place], [place] [address]  [facility]  Medication assistance  Rental assistance  Food pantry  Medication assistance  Housing assistance  Emergency food distribution  Utility assistance [phone] [address] [place], [place]  [phone]  [address] [place], [place] [address] Hours: Tuesdays and Thursdays from 9am - 12 noon by appointment only  [phone] [address] [place], [place] [address]  [facility] Lanae Boast   Accepts private insurance, [place], and [place].  Payment is based on a sliding scale for those without insurance.  Hours: Mondays, Tuesdays and Thursdays, 8:30 am - 5:30 pm.   [phone] [address] [place], [place]  [facility] - Family Medicine at [facility], [place], and [place].  Payment is based on a sliding scale for those without insurance. [phone] [address] [place], [place]  [facility] - Pediatrics at E. Scientist, research (physical sciences), [employer], and [place].  Payment is based on a sliding scale for those without insurance [phone] [address], [employer], [place]  [facility] - Pediatrics at [employer], [place], and [place].  Payment is based on a sliding scale for those without insurance. [phone] [address] [place], [place]  [facility] - Pediatrics at [facility], [place], and [place].  Payment is based on a sliding scale for those without insurance. [phone], [phone] [place], [place].    [facility]  Maternity care.  Accepts Medicaid and self-pay. [phone] [address] [place], [place]    Edema Edema is an abnormal buildup of  fluids. It is more common in your legs and thighs. Painless swelling of the feet and ankles is more likely as a person ages. It also is common in looser skin, like around your eyes. HOME CARE   Keep the affected body part above the level of the heart while lying down.  Do not sit still or stand for a long time.  Do not put anything right under your knees when you lie down.  Do not wear tight clothes on your upper legs.  Exercise your legs to help the puffiness (swelling) go down.  Wear elastic bandages or support stockings as told by your doctor.  A low-salt diet may help lessen the puffiness.  Only take medicine as told by your doctor. GET HELP IF:  Treatment is not working.  You have heart, liver, or kidney disease and notice that your skin looks puffy or shiny.  You have puffiness in your legs that does not get better when you raise your legs.  You have sudden weight gain for no reason. GET HELP RIGHT AWAY IF:   You have shortness of breath or chest pain.  You cannot breathe when you lie down.  You have pain, redness, or warmth in the areas that are puffy.  You have heart, liver, or kidney disease and get edema all of a sudden.  You have a fever and your symptoms get worse all of a sudden. MAKE SURE YOU:   Understand these instructions.  Will watch your condition.  Will get help right away if you are not doing well or get worse.   This information is not intended to replace advice given to you by your health care provider. Make sure you discuss any questions you have with your health care provider.   Document Released: 02/02/2008 Document Revised: 08/21/2013 Document Reviewed: 06/08/2013 Elsevier Interactive Patient Education [employer]2016 Elsevier Inc.

## 2016-02-22 NOTE — ED Notes (Signed)
Patient here with 1 week of bilateral leg swelling and feels like she is retaining fluid, no hx of same. No distress. Denies any shortness of breath

## 2016-02-22 NOTE — ED Provider Notes (Signed)
CSN: [id number]     Arrival date & time 02/22/16  1305 History   First MD Initiated Contact with Patient 02/22/16 1434     Chief Complaint  Patient presents with  . Leg Swelling     (Consider location/radiation/quality/duration/timing/severity/associated sxs/prior Treatment) HPI Comments: [name] is a 30 y.o. female with history of Bipolar disorder, MDD, asthma, and anxiety presents to ED with complaint of b/l lower extremity swelling. Pt states sxs approximately one week ago and has progressively worsened. States her legs "feel heavy" and "painful." She also endorses SOB and chest pain. Chest pain is centrally located, occuring at rest, intermittent, sharp, 10/10, lasting approximately 15-20 minutes, and worse with deep inspiration. She also endorses a non-productive cough. Denies wheezing. Denies hemoptysis. She has however had long distance travel throughout the continental [place] with her boyfriend who is a boxcar driver.  She is also on OCP. Denies h/o blood clot, recent surgery/hospitalization, or Denies abdominal complaints. No urinary complaints.   The history is provided by the patient and medical records.    Past Medical History  Diagnosis Date  . Asthma   . Bipolar 1 disorder (HCC)   . Anxiety   . Endometriosis   . Mental disorder   . Depression    Past Surgical History  Procedure Laterality Date  . Cholecystectomy    . Tubal ligation    . Abdominal surgery      "For endometriosis"  . Oophorectomy Right 2012   Family History  Problem Relation Age of Onset  . Stroke Mother   . Hyperlipidemia Other    Social History  Substance Use Topics  . Smoking status: Current Every Day Smoker -- 0.20 packs/day for 13 years    Types: Cigarettes  . Smokeless tobacco: Never Used  . Alcohol Use: Yes     Comment: Drinks once a month   OB History    Gravida Para Term Preterm AB TAB SAB Ectopic Multiple Living   4 2 2  0 2 0 2 0 0 3     Review of Systems    Constitutional: Positive for fatigue ( generalized). Negative for fever, chills and diaphoresis.  HENT: Positive for sore throat.   Eyes: Negative for visual disturbance.  Respiratory: Positive for shortness of breath.   Cardiovascular: Positive for chest pain and leg swelling ( b/l).  Gastrointestinal: Negative for nausea, vomiting, abdominal pain, diarrhea and constipation.  Genitourinary: Negative for dysuria and hematuria.  Musculoskeletal: Positive for myalgias ( lower extremities b/l). Negative for neck pain.  Skin: Negative for rash.  Neurological: Positive for weakness (generalized), light-headedness ( positional) and numbness ( in feet b/l). Negative for dizziness.      Allergies  Coconut flavor and Bee venom  Home Medications   Prior to Admission medications   Medication Sig Start Date End Date Taking? Authorizing Provider  albuterol (PROVENTIL HFA;VENTOLIN HFA) 108 (90 BASE) MCG/ACT inhaler Inhale 1-2 puffs into the lungs every 6 (six) hours as needed for wheezing or shortness of breath.   Yes Historical Provider, MD  benztropine (COGENTIN) 0.5 MG tablet Take 0.5 mg by mouth at bedtime.   Yes Historical Provider, MD  busPIRone (BUSPAR) 10 MG tablet Take 10 mg by mouth 3 (three) times daily.   Yes Historical Provider, MD  Calcium Citrate-Vitamin D (CALCIUM + D PO) Take 1 tablet by mouth 2 (two) times daily.   Yes Historical Provider, MD  divalproex (DEPAKOTE ER) 500 MG 24 hr tablet Take 500 mg by mouth  2 (two) times daily.   Yes Historical Provider, MD  hydrOXYzine (VISTARIL) 25 MG capsule Take 25 mg by mouth every 8 (eight) hours as needed.   Yes Historical Provider, MD  norgestimate-ethinyl estradiol (SPRINTEC 28) 0.25-35 MG-MCG tablet Take 1 tablet by mouth daily. 02/02/16  Yes Peggy Constant, MD  QUEtiapine (SEROQUEL XR) 50 MG TB24 24 hr tablet Take 50 mg by mouth at bedtime.   Yes Historical Provider, MD  ranitidine (ZANTAC) 150 MG tablet Take 150 mg by mouth at bedtime.    Yes Historical Provider, MD   BP 106/64 mmHg  Pulse 74  Temp(Src) 98.6 F (37 C) (Oral)  Resp 16  Ht 5\' 2"  (1.575 m)  Wt 81.647 kg  BMI 32.91 kg/m2  SpO2 99%  LMP 02/01/2016 Physical Exam  Constitutional: She appears well-developed and well-nourished. No distress.  HENT:  Head: Normocephalic and atraumatic.  Mouth/Throat: Oropharynx is clear and moist. No oropharyngeal exudate.  Eyes: Conjunctivae and EOM are normal. Pupils are equal, round, and reactive to light. Right eye exhibits no discharge. Left eye exhibits no discharge. No scleral icterus.  Neck: Normal range of motion. Neck supple.  Cardiovascular: Normal rate, regular rhythm, normal heart sounds and intact distal pulses.   No murmur heard. Pulmonary/Chest: Effort normal and breath sounds normal. No respiratory distress. She has no wheezes. She exhibits tenderness.    Diffuse TTP of anterior chest wall  Abdominal: Soft. Bowel sounds are normal. There is no tenderness. There is no rebound and no guarding.  Musculoskeletal: Normal range of motion.  B/l lower extremity swelling.   Lymphadenopathy:    She has no cervical adenopathy.  Neurological: She is alert. She displays a negative Romberg sign. Coordination normal. She displays no Babinski's sign on the right side. She displays no Babinski's sign on the left side.  Mental Status:  Alert, thought content appropriate, able to give a coherent history. Speech fluent without evidence of aphasia. Able to follow 2 step commands without difficulty.  Cranial Nerves:  II:  Peripheral visual fields grossly normal, pupils equal, round, reactive to light III,IV, VI: ptosis not present, extra-ocular motions intact bilaterally  V,VII: smile symmetric, facial light touch sensation equal VIII: hearing grossly normal to voice  X: uvula elevates symmetrically  XI: bilateral shoulder shrug symmetric and strong XII: midline tongue extension without fassiculations Motor:  Normal tone.  5/5 in upper extremities bilaterally including strong and equal grip strength and dorsiflexion/plantar flexion. Patient able to lift lower extremities b/l. Sensory: light touch normal in all extremities.  Deep Tendon Reflexes: 2+ and symmetric in the biceps and achilles Cerebellar: normal finger-to-nose with bilateral upper extremities Gait: normal gait and balance CV: distal pulses palpable throughout  Skin: Skin is warm and dry. She is not diaphoretic.  No warmth or erythema.   Psychiatric: She has a normal mood and affect. Her behavior is normal.    ED Course  Procedures (including critical care time) Labs Review Labs Reviewed  COMPREHENSIVE METABOLIC PANEL - Abnormal; Notable for the following:    BUN <5 (*)    Calcium 8.7 (*)    Total Protein 5.9 (*)    Albumin 3.2 (*)    AST 14 (*)    ALT 10 (*)    Total Bilirubin 0.2 (*)    All other components within normal limits  TROPONIN I  CBC WITH DIFFERENTIAL/PLATELET  D-DIMER, QUANTITATIVE (NOT AT [facility])  URINALYSIS, ROUTINE W REFLEX MICROSCOPIC (NOT AT [facility])  PREGNANCY, URINE  TROPONIN I  Imaging Review Dg Chest 2 View  02/22/2016  CLINICAL DATA:  LEFT-sided chest pain and dry cough EXAM: CHEST  2 VIEW COMPARISON:  Radiograph 02/18/2015 FINDINGS: Normal mediastinum and cardiac silhouette. Normal pulmonary vasculature. No evidence of effusion, infiltrate, or pneumothorax. No acute bony abnormality. IMPRESSION: Normal chest radiograph Electronically Signed   By: Genevive Bi M.D.   On: 02/22/2016 16:30   I have personally reviewed and evaluated these images and lab results as part of my medical decision-making.   EKG Interpretation   Date/Time:  Sunday February 22 2016 15:40:05 EDT Ventricular Rate:  61 PR Interval:    QRS Duration: 88 QT Interval:  412 QTC Calculation: 415 R Axis:   -9 Text Interpretation:  Sinus or ectopic atrial rhythm Short PR interval  Borderline T abnormalities, diffuse leads Nonspecific T wave  abnormality  Confirmed by Manus Gunning  MD, STEPHEN [phone]) on 02/22/2016 4:06:38 PM Also  confirmed by Manus Gunning  MD, STEPHEN [phone]), editor [place]  CCT, BEVERLY  ([id number])  on 02/22/2016 4:09:07 PM      MDM   Final diagnoses:  Swelling of lower extremity   Patient is afebrile and non-toxic appearing. Vital signs are stable. Physical exam remarkable Initial troponin negative. No significant changes in EKG. Heart score 1. Well's 0. PERC positive. D-dimer nml. Low suspicion for pulmonary embolism.  U/A nml. Upreg negative. CBC re-assuring. CXR negative. Low suspicion for renal, liver, or cardiac cause of swelling. ?dietary indiscretion vs. Idiopathic. Will delta troponin. PO dose of lasix to help with swelling. Discussed pt with Dr. Madilyn Hook, who also saw pt. Agrees with plan.   Discussed results and plan with pt. Symptomatic management - compression stockings, elevation of lower extremities, and dietary discretion. Discussed return precautions. Encouraged follow up with PCP within one week. Pt. Voiced understanding and is agreeable.   At shift change discussed pt with Fayrene Helper, PA-C. Pending normal second troponin, pt. Safe for discharge. Care assumed by Fayrene Helper, PA-C.   Lona Kettle, PA-C 02/22/16 1740  Tilden Fossa, MD 02/23/16 [phone]

## 2016-04-26 ENCOUNTER — Telehealth: Payer: Self-pay | Admitting: *Deleted

## 2016-04-26 DIAGNOSIS — Z3041 Encounter for surveillance of contraceptive pills: Secondary | ICD-10-CM

## 2016-04-26 MED ORDER — NORGESTIMATE-ETH ESTRADIOL 0.25-35 MG-MCG PO TABS
1.0000 | ORAL_TABLET | Freq: Every day | ORAL | 1 refills | Status: DC
Start: 2016-04-26 — End: 2017-05-03

## 2016-04-26 NOTE — Telephone Encounter (Signed)
Pt left message stating that she needs appt for annual GYN exam and birth control. I returned her call and discussed her request. I stated that we had given refills in June and she was advised by another RN to schedule appt.  Pt stated that she was having problems with boyfriend and was not sure where she would be so did not schedule. I advised that I will send in 2 more months worth of refill and have a staff member call her with appt which she must keep.  Pt agreed and voiced understanding of information given.

## 2016-06-09 ENCOUNTER — Ambulatory Visit: Payer: Self-pay | Admitting: Obstetrics & Gynecology

## 2016-12-05 IMAGING — US US PELVIS COMPLETE
1 series · 13 of 25 positions shown · non-contrast
Comparison: 08/04/2003

CLINICAL DATA: Left lower quadrant pain x1 month, status post right
oophorectomy

EXAM:
TRANSABDOMINAL AND TRANSVAGINAL ULTRASOUND OF PELVIS
DOPPLER ULTRASOUND OF OVARIES
TECHNIQUE: Both transabdominal and transvaginal ultrasound examinations of the
pelvis were performed. Transabdominal technique was performed for
global imaging of the pelvis including uterus, ovaries, adnexal
regions, and pelvic cul-de-sac.
It was necessary to proceed with endovaginal exam following the
transabdominal exam to visualize the endometrium. Color and duplex
Doppler ultrasound was utilized to evaluate blood flow to the
ovaries.

[Series 1: us pelvis complete · 0.20mm/px · 13 of 89 slices shown]
[im 1/89]
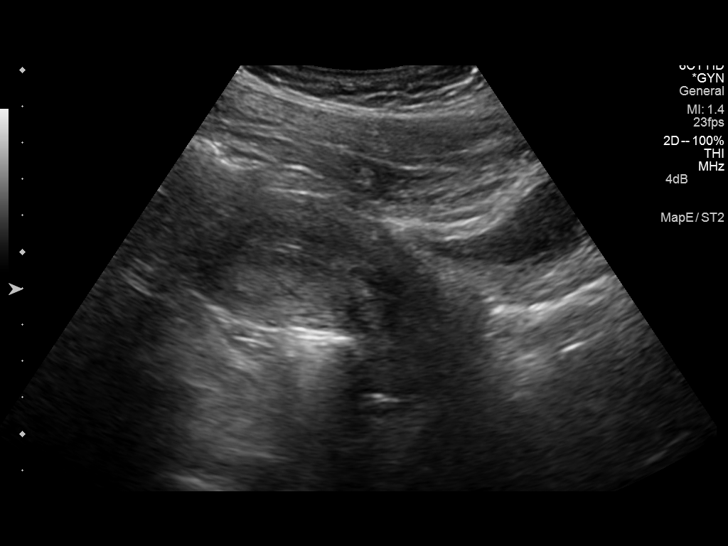
[im 8/89]
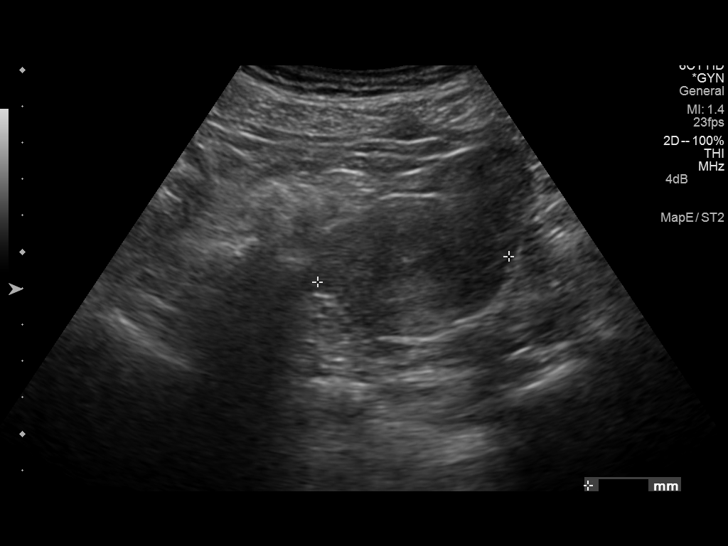
[im 15/89]
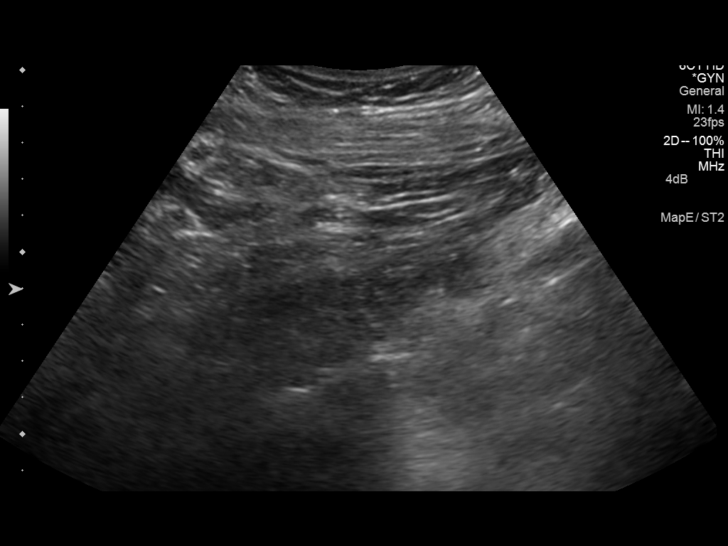
[im 23/89]
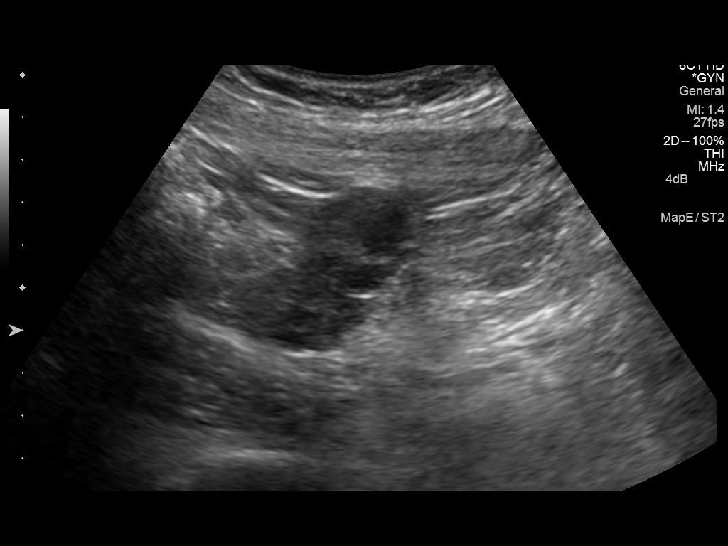
[im 30/89]
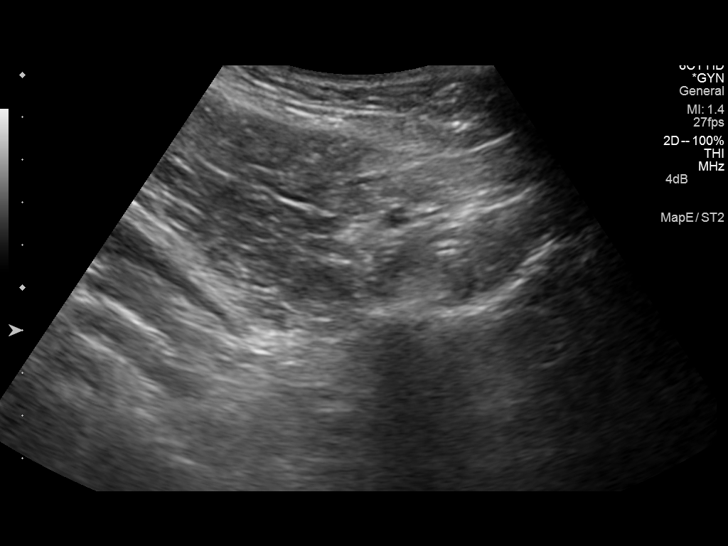
[im 37/89]
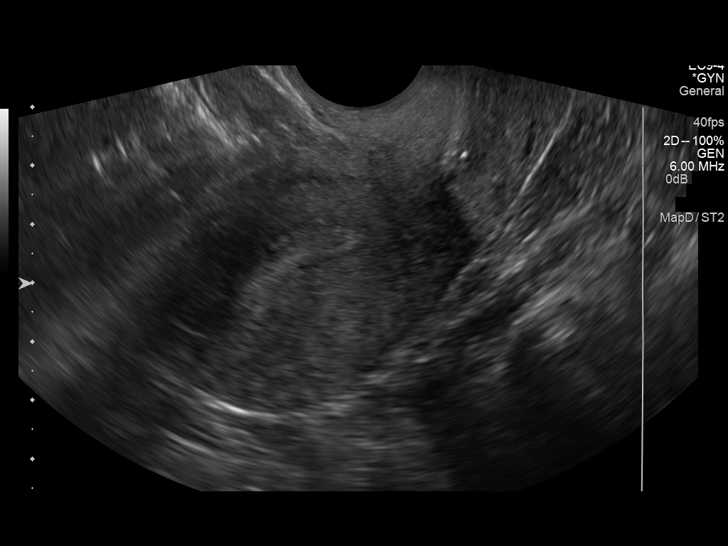
[im 45/89]
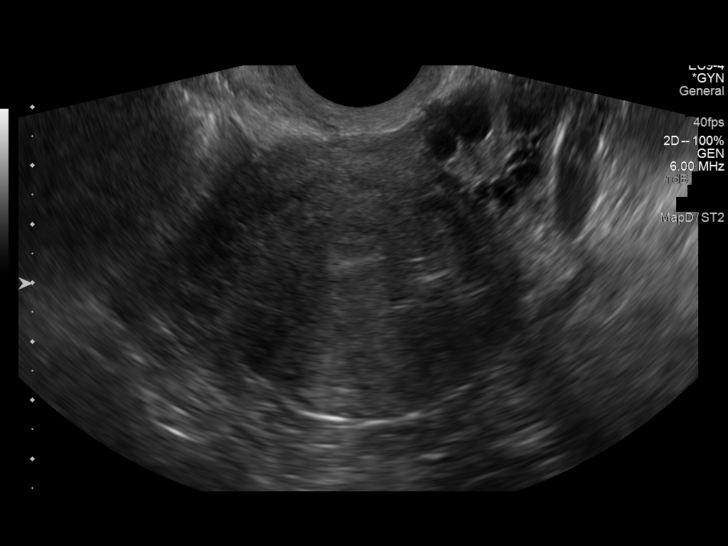
[im 52/89]
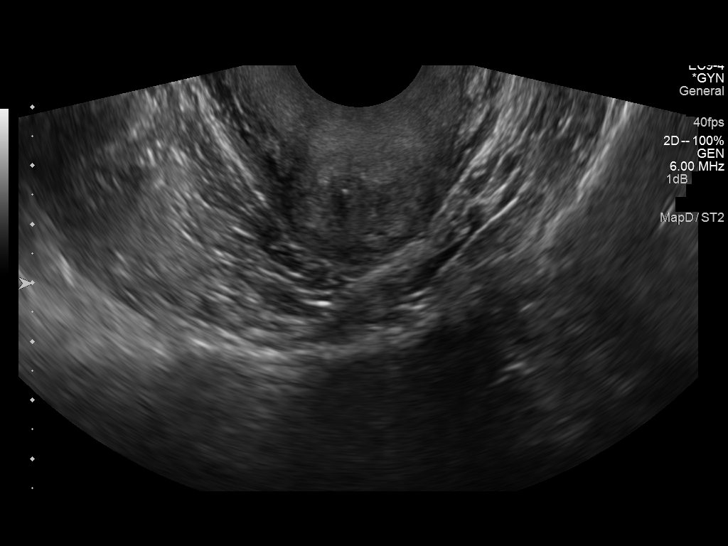
[im 59/89]
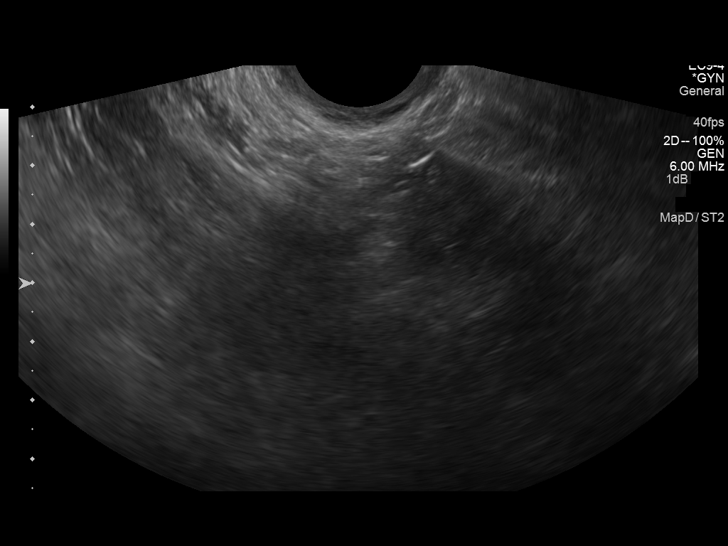
[im 67/89]
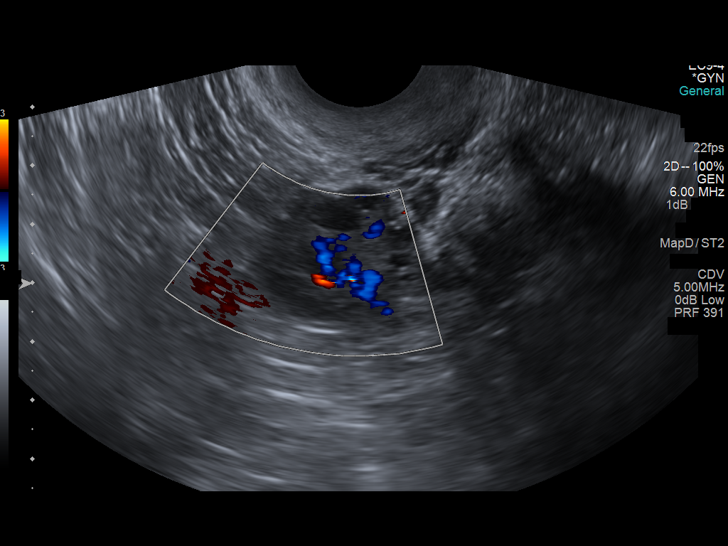
[im 74/89]
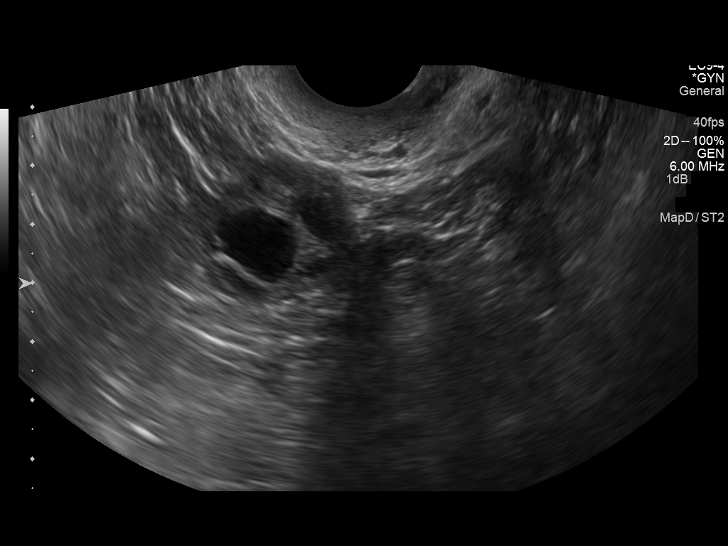
[im 81/89]
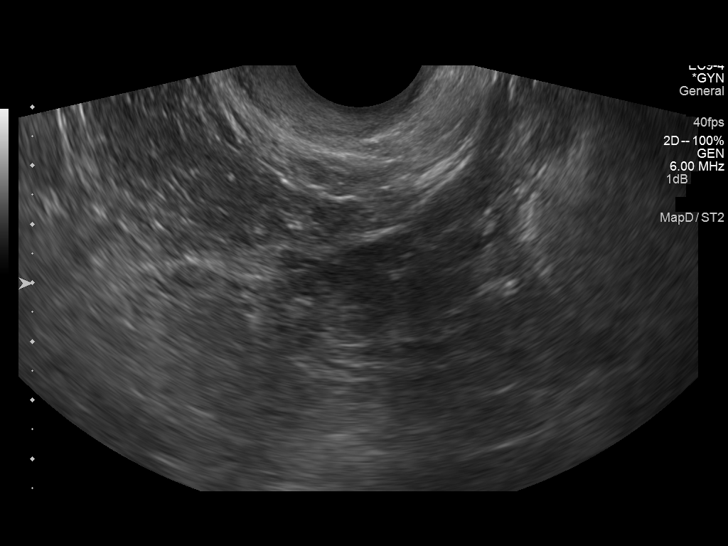
[im 89/89]
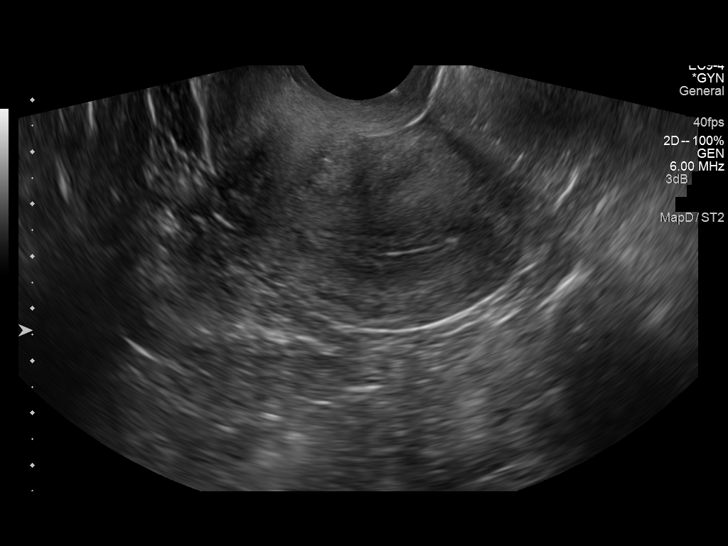

[13 of 25 positions shown; findings below may reference images not displayed]

FINDINGS: Uterus

Measurements: 8.5 x 3.8 x 5.2 cm. No fibroids or other mass
visualized.

Endometrium

Thickness: 4 mm.  No focal abnormality visualized.

Right ovary

Surgically absent.

Left ovary

Measurements: 2.7 x 2.5 x 2.5 cm. 1.4 x 1.0 x 1.2 cm simple
cyst/follicle.

Pulsed Doppler evaluation of the left ovary demonstrates normal
low-resistance arterial and venous waveforms.

Other findings

No free fluid.
IMPRESSION: Status post right oophorectomy.

Otherwise negative pelvic ultrasound.

No evidence of left ovarian torsion.

## 2017-03-19 ENCOUNTER — Emergency Department (HOSPITAL_COMMUNITY)
Admission: EM | Admit: 2017-03-19 | Discharge: 2017-03-19 | Disposition: A | Discharge status: Home / Self Care | Payer: Medicaid Other | Attending: Emergency Medicine | Admitting: Emergency Medicine

## 2017-03-19 ENCOUNTER — Encounter (HOSPITAL_COMMUNITY): Payer: Self-pay

## 2017-03-19 DIAGNOSIS — F17210 Nicotine dependence, cigarettes, uncomplicated: Secondary | ICD-10-CM | POA: Diagnosis not present

## 2017-03-19 DIAGNOSIS — B349 Viral infection, unspecified: Secondary | ICD-10-CM | POA: Diagnosis not present

## 2017-03-19 DIAGNOSIS — F419 Anxiety disorder, unspecified: Secondary | ICD-10-CM | POA: Diagnosis not present

## 2017-03-19 DIAGNOSIS — J45909 Unspecified asthma, uncomplicated: Secondary | ICD-10-CM | POA: Diagnosis not present

## 2017-03-19 DIAGNOSIS — R51 Headache: Secondary | ICD-10-CM | POA: Diagnosis present

## 2017-03-19 DIAGNOSIS — Z79899 Other long term (current) drug therapy: Secondary | ICD-10-CM | POA: Diagnosis not present

## 2017-03-19 LAB — RAPID STREP SCREEN (MED CTR MEBANE ONLY): Streptococcus, Group A Screen (Direct): NEGATIVE

## 2017-03-19 MED ORDER — CHLORHEXIDINE GLUCONATE 0.12 % MT SOLN: 15.0000 mL | Freq: Two times a day (BID) | OROMUCOSAL | 0 refills | Status: DC

## 2017-03-19 MED ORDER — IBUPROFEN 600 MG PO TABS: 600.0000 mg | ORAL_TABLET | Freq: Four times a day (QID) | ORAL | 0 refills | Status: DC | PRN

## 2017-03-19 NOTE — ED Provider Notes (Signed)
[facility]-EMERGENCY DEPT Provider Note   CSN: [id number] Arrival date & time: 03/19/17  1328     History   Chief Complaint No chief complaint on file.   HPI [name] is a 31 y.o. female.  HPI   31 year old female with history of bipolar, asthma, anxiety and depression presenting with cold symptoms. Patient states for the past 3 days she has had throbbing headache, bilateral ear pain, and sore throat. The symptom has been persistent, moderate in severity, not adequately improved using cough drops and throat spray. She endorses subjective fever and chills. She denies having hearing changes, diplopia, runny nose, sneezing, coughing, chest pain, shortness of breath, productive cough, abdominal pain, nausea vomiting or diarrhea or rash. No recent sick contact. No other complaint.  Past Medical History:  Diagnosis Date  . Anxiety   . Asthma   . Bipolar 1 disorder (HCC)   . Depression   . Endometriosis   . Mental disorder     Patient Active Problem List   Diagnosis Date Noted  . Pelvic pain in female 01/15/2015  . MDD (major depressive disorder), recurrent episode, severe (HCC) 03/11/2014  . MDD (major depressive disorder) 03/11/2014  . Bipolar disorder with depression (HCC) 09/14/2013  . Major depressive disorder, recurrent episode, severe (HCC) 12/30/2011    Class: Stage 3  . Bipolar affective disorder, depressed, severe (HCC) 12/30/2011    Class: Acute    Past Surgical History:  Procedure Laterality Date  . ABDOMINAL SURGERY     "For endometriosis"  . CHOLECYSTECTOMY    . OOPHORECTOMY Right 2012  . TUBAL LIGATION      OB History    Gravida Para Term Preterm AB Living   4 2 2  0 2 3   SAB TAB Ectopic Multiple Live Births   2 0 0 0 1       Home Medications    Prior to Admission medications   Medication Sig Start Date End Date Taking? Authorizing Provider  albuterol (PROVENTIL HFA;VENTOLIN HFA) 108 (90 BASE) MCG/ACT inhaler Inhale 1-2 puffs into the lungs  every 6 (six) hours as needed for wheezing or shortness of breath.    [provider]  benztropine (COGENTIN) 0.5 MG tablet Take 0.5 mg by mouth at bedtime.    [provider]  busPIRone (BUSPAR) 10 MG tablet Take 10 mg by mouth 3 (three) times daily.    [provider]  Calcium Citrate-Vitamin D (CALCIUM + D PO) Take 1 tablet by mouth 2 (two) times daily.    [provider]  divalproex (DEPAKOTE ER) 500 MG 24 hr tablet Take 500 mg by mouth 2 (two) times daily.    [provider]  hydrOXYzine (VISTARIL) 25 MG capsule Take 25 mg by mouth every 8 (eight) hours as needed.    [provider]  norgestimate-ethinyl estradiol (SPRINTEC 28) 0.25-35 MG-MCG tablet Take 1 tablet by mouth daily. 04/26/16   Levie Heritage, DO  QUEtiapine (SEROQUEL XR) 50 MG TB24 24 hr tablet Take 50 mg by mouth at bedtime.    [provider]  ranitidine (ZANTAC) 150 MG tablet Take 150 mg by mouth at bedtime.    [provider]    Family History Family History  Problem Relation Age of Onset  . Stroke Mother   . Hyperlipidemia Other     Social History Social History  Substance Use Topics  . Smoking status: Current Every Day Smoker    Packs/day: 0.20    Years: 13.00  Types: Cigarettes  . Smokeless tobacco: Never Used  . Alcohol use Yes     Comment: Drinks once a month     Allergies   Coconut flavor and Bee venom   Review of Systems Review of Systems  All other systems reviewed and are negative.    Physical Exam Updated Vital Signs BP 111/81 (BP Location: Left Arm)   Pulse 100   Temp 98.5 F (36.9 C) (Oral)   Resp 16   SpO2 99%   Physical Exam  Constitutional: She appears well-developed and well-nourished. No distress.  Obese female nontoxic in appearance  HENT:  Head: Atraumatic.  Ears: Normal TMs bilaterally Nose: Normal nares Throat: Uvula is midline no tonsillar enlargement or exudates, no posterior pharyngeal  erythema, no trismus No facial swelling  Eyes: Conjunctivae are normal.  Neck: Normal range of motion. Neck supple.  No nuchal rigidity  Cardiovascular: Normal rate and regular rhythm.   Pulmonary/Chest: Effort normal and breath sounds normal.  Abdominal: Soft. She exhibits no distension. There is no tenderness.  Lymphadenopathy:    She has cervical adenopathy.  Neurological: She is alert.  Skin: No rash noted.  Psychiatric: She has a normal mood and affect.  Nursing note and vitals reviewed.    ED Treatments / Results  Labs (all labs ordered are listed, but only abnormal results are displayed) Labs Reviewed  RAPID STREP SCREEN (NOT AT [facility])  CULTURE, GROUP A STREP [facility])    EKG  EKG Interpretation None       Radiology No results found.  Procedures Procedures (including critical care time)  Medications Ordered in ED Medications - No data to display   Initial Impression / Assessment and Plan / ED Course  I have reviewed the triage vital signs and the nursing notes.  Pertinent labs & imaging results that were available during my care of the patient were reviewed by me and considered in my medical decision making (see chart for details).     BP 111/81 (BP Location: Left Arm)   Pulse 100   Temp 98.5 F (36.9 C) (Oral)   Resp 16   SpO2 99%    Final Clinical Impressions(s) / ED Diagnoses   Final diagnoses:  Viral infection    New Prescriptions New Prescriptions   CHLORHEXIDINE (PERIDEX) 0.12 % SOLUTION    Use as directed 15 mLs in the mouth or throat 2 (two) times daily.   IBUPROFEN (ADVIL,MOTRIN) 600 MG TABLET    Take 1 tablet (600 mg total) by mouth every 6 (six) hours as needed.   2:55 PM Patient is here with cold symptoms. No concerning feature noted on exam. Likely viral in etiology. Will provide symptomatic treatment, return precaution discussed.   Fayrene Helperran, Rajanae Mantia, PA-C 03/19/17 1457    Arby BarrettePfeiffer, Marcy, MD 03/19/17 [phone]

## 2017-03-19 NOTE — ED Triage Notes (Signed)
Patient complains of sore throat, bilateral ear pain and mild headache x 3 days, no relief with otc meds

## 2017-03-19 NOTE — ED Notes (Signed)
C/o sorethroat, chills, bodyaches x 3 days. Exudate noted on tonsils. RS obtained.

## 2017-03-20 LAB — CULTURE, GROUP A STREP (THRC)

## 2017-03-21 ENCOUNTER — Telehealth: Payer: Self-pay | Admitting: Emergency Medicine

## 2017-03-21 NOTE — Telephone Encounter (Signed)
Post ED Visit - Positive Culture Follow-up: Successful Patient Follow-Up  Culture assessed and recommendations reviewed by: []  [name], Pharm.D. []  [name], [address]Ph[address] BCPS AQ-ID []  Garvin FilaMike [name], Pharm.D., BCPS []  Georgina PillionElizabeth [name], Pharm.D., BCPS []  [place]ton[place]Minh [name], [address]Ph[address]D., BCPS, AAHIVP []  Estella HuskMichelle [name], Pharm.D., BCPS, AAHIVP []  Lysle Pearlachel Rumbarger, PharmD, BCPS []  [name], PharmD, BCPS []  Pollyann SamplesAndy [name], PharmD, BCPS [name] PharmD  Positive strep culture  [x]  Patient discharged without antimicrobial prescription and treatment is now indicated []  Organism is resistant to prescribed ED discharge antimicrobial []  Patient with positive blood cultures  Changes discussed with ED provider: Sharen Hecklaudia Gibbons PA New antibiotic prescription start Amoxicillin 500mg  po bid x 10 days  Attempting to contact patient   Berle MullMiller, Margert Edsall 03/21/2017, 11:16 AM

## 2017-03-21 NOTE — Progress Notes (Signed)
ED Antimicrobial Stewardship Positive Culture Follow Up   [name] is an 31 y.o. female who [facility] with a chief complaint of sore throat and bilateral ear pain.  Recent Results (from the past 720 hour(s))  Rapid strep screen (not at [facility])     Status: None   Collection Time: 03/19/17  1:40 PM  Result Value Ref Range Status   Streptococcus, Group A Screen (Direct) NEGATIVE NEGATIVE Final    Comment: (NOTE) A Rapid Antigen test may result negative if the antigen level in the sample is below the detection level of this test. The FDA has not cleared this test as a stand-alone test therefore the rapid antigen negative result has reflexed to a Group A Strep culture.   Culture, group A strep     Status: None   Collection Time: 03/19/17  1:40 PM  Result Value Ref Range Status   Specimen Description THROAT  Final   Special Requests NONE Reflexed from [id number]  Final   Culture ABUNDANT GROUP A STREP (S.PYOGENES) ISOLATED  Final   Report Status 03/20/2017 FINAL  Final     [x]  Patient discharged originally without antimicrobial agent and treatment is now indicated  New antibiotic prescription: Amoxicillin 500 mg BID X 10 days  ED Provider: Sharen Hecklaudia Gibbons, PA-C   Della GooEmily S Fredie Majano, PharmD 03/21/2017, 9:24 AM PGY2 Infectious Diseases Pharmacy Resident Phone# [phone]

## 2017-03-28 ENCOUNTER — Telehealth: Payer: Self-pay | Admitting: Emergency Medicine

## 2017-05-02 ENCOUNTER — Encounter (HOSPITAL_COMMUNITY): Payer: Self-pay | Admitting: *Deleted

## 2017-05-02 ENCOUNTER — Emergency Department (HOSPITAL_COMMUNITY)
Admission: EM | Admit: 2017-05-02 | Discharge: 2017-05-03 | Disposition: A | Discharge status: Home / Self Care | Payer: Medicaid Other | Attending: Emergency Medicine | Admitting: Emergency Medicine

## 2017-05-02 DIAGNOSIS — R109 Unspecified abdominal pain: Secondary | ICD-10-CM

## 2017-05-02 DIAGNOSIS — M545 Low back pain: Secondary | ICD-10-CM | POA: Diagnosis not present

## 2017-05-02 DIAGNOSIS — J45909 Unspecified asthma, uncomplicated: Secondary | ICD-10-CM | POA: Diagnosis not present

## 2017-05-02 DIAGNOSIS — R197 Diarrhea, unspecified: Secondary | ICD-10-CM | POA: Insufficient documentation

## 2017-05-02 DIAGNOSIS — R1031 Right lower quadrant pain: Secondary | ICD-10-CM | POA: Diagnosis present

## 2017-05-02 DIAGNOSIS — N390 Urinary tract infection, site not specified: Secondary | ICD-10-CM

## 2017-05-02 DIAGNOSIS — F17210 Nicotine dependence, cigarettes, uncomplicated: Secondary | ICD-10-CM | POA: Diagnosis not present

## 2017-05-02 DIAGNOSIS — Z79899 Other long term (current) drug therapy: Secondary | ICD-10-CM | POA: Insufficient documentation

## 2017-05-02 LAB — CBC
HEMATOCRIT: 39.6 % (ref 36.0–46.0)
Hemoglobin: 13.2 g/dL (ref 12.0–15.0)
MCH: 32.5 pg (ref 26.0–34.0)
MCHC: 33.3 g/dL (ref 30.0–36.0)
MCV: 97.5 fL (ref 78.0–100.0)
PLATELETS: 209 10*3/uL (ref 150–400)
RBC: 4.06 MIL/uL (ref 3.87–5.11)
RDW: 14.3 % (ref 11.5–15.5)
WBC: 5.2 10*3/uL (ref 4.0–10.5)

## 2017-05-02 LAB — URINALYSIS, MICROSCOPIC (REFLEX)

## 2017-05-02 LAB — COMPREHENSIVE METABOLIC PANEL
ALBUMIN: 3.7 g/dL (ref 3.5–5.0)
ALT: 14 U/L (ref 14–54)
AST: 18 U/L (ref 15–41)
Alkaline Phosphatase: 56 U/L (ref 38–126)
Anion gap: 7 (ref 5–15)
BILIRUBIN TOTAL: 0.5 mg/dL (ref 0.3–1.2)
BUN: 8 mg/dL (ref 6–20)
CO2: 23 mmol/L (ref 22–32)
CREATININE: 0.87 mg/dL (ref 0.44–1.00)
Calcium: 8.7 mg/dL — ABNORMAL LOW (ref 8.9–10.3)
Chloride: 108 mmol/L (ref 101–111)
GFR calc Af Amer: >60 mL/min (ref >60)
GFR calc non Af Amer: >60 mL/min (ref >60)
GLUCOSE: 83 mg/dL (ref 65–99)
POTASSIUM: 3.6 mmol/L (ref 3.5–5.1)
Sodium: 138 mmol/L (ref 135–145)
TOTAL PROTEIN: 7 g/dL (ref 6.5–8.1)

## 2017-05-02 LAB — LIPASE, BLOOD: Lipase: 21 U/L (ref 11–51)

## 2017-05-02 LAB — URINALYSIS, ROUTINE W REFLEX MICROSCOPIC
Glucose, UA: NEGATIVE mg/dL
KETONES UR: 15 mg/dL — AB
NITRITE: POSITIVE — AB
PH: 6 (ref 5.0–8.0)
Protein, ur: 30 mg/dL — AB
Specific Gravity, Urine: 1.025 (ref 1.005–1.030)

## 2017-05-02 NOTE — ED Triage Notes (Signed)
Pt c/o RLQ abd pain with diarrhea onset x 2-3 days, pt denies vomiting, pt A&O x4

## 2017-05-02 NOTE — ED Notes (Signed)
Pt stated she wasn't able to urinate at this time.

## 2017-05-03 ENCOUNTER — Emergency Department (HOSPITAL_COMMUNITY): Payer: Medicaid Other

## 2017-05-03 LAB — PREGNANCY, URINE: PREG TEST UR: NEGATIVE

## 2017-05-03 MED ORDER — MORPHINE SULFATE (PF) 4 MG/ML IV SOLN
4.0000 mg | Freq: Once | INTRAVENOUS | Status: AC
  Administered 2017-05-03: 4 mg via INTRAVENOUS
  Filled 2017-05-03: qty 1

## 2017-05-03 MED ORDER — DEXTROSE 5 % IV SOLN
1.0000 g | Freq: Once | INTRAVENOUS | Status: AC
  Administered 2017-05-03: 1 g via INTRAVENOUS
  Filled 2017-05-03: qty 10

## 2017-05-03 MED ORDER — ONDANSETRON HCL 4 MG/2ML IJ SOLN
4.0000 mg | Freq: Once | INTRAMUSCULAR | Status: AC
  Administered 2017-05-03: 4 mg via INTRAVENOUS
  Filled 2017-05-03: qty 2

## 2017-05-03 MED ORDER — KETOROLAC TROMETHAMINE 30 MG/ML IJ SOLN
30.0000 mg | Freq: Once | INTRAMUSCULAR | Status: AC
  Administered 2017-05-03: 30 mg via INTRAVENOUS
  Filled 2017-05-03: qty 1

## 2017-05-03 MED ORDER — CEPHALEXIN 500 MG PO CAPS: 500.0000 mg | ORAL_CAPSULE | Freq: Three times a day (TID) | ORAL | 0 refills | Status: DC

## 2017-05-03 MED ORDER — NAPROXEN 500 MG PO TABS: 500.0000 mg | ORAL_TABLET | Freq: Two times a day (BID) | ORAL | 0 refills | Status: DC

## 2017-05-03 MED ORDER — SODIUM CHLORIDE 0.9 % IV BOLUS (SEPSIS)
1000.0000 mL | Freq: Once | INTRAVENOUS | Status: AC
  Administered 2017-05-03: 1000 mL via INTRAVENOUS

## 2017-05-03 NOTE — ED Provider Notes (Signed)
[facility]-EMER[facility]ENCY DEPT Provider Note   CSN: [id number] Arrival date & time: 05/02/17  1823     History   Chief Complaint Chief Complaint  Patient presents with  . Abdominal Pain    HPI [name] is a 31 y.o. female.  HPI  This is a 31 year old female who presents with 2 day history of right lower quadrant and back pain.  Patient reports fairly acute onset right lower quadrant pain that radiates to the back. Rates her pain at 10 out of 10. She states she has taken Tylenol with minimal relief. Never had pain like this before. Reports diarrhea. No nausea or vomiting. Denies any hematuria or dysuria. Denies fever.  Past Medical History:  Diagnosis Date  . Anxiety   . Asthma   . Bipolar 1 disorder (HCC)   . Depression   . Endometriosis   . Mental disorder     Patient Active Problem List   Diagnosis Date Noted  . Pelvic pain in female 01/15/2015  . MDD (major depressive disorder), recurrent episode, severe (HCC) 03/11/2014  . MDD (major depressive disorder) 03/11/2014  . Bipolar disorder with depression (HCC) 09/14/2013  . Major depressive disorder, recurrent episode, severe (HCC) 12/30/2011    Class: Stage 3  . Bipolar affective disorder, depressed, severe (HCC) 12/30/2011    Class: Acute    Past Surgical History:  Procedure Laterality Date  . ABDOMINAL SURGERY     "For endometriosis"  . CHOLECYSTECTOMY    . OOPHORECTOMY Right 2012  . TUBAL LIGATION      OB History    Gravida Para Term Preterm AB Living   4 2 2  0 2 3   SAB TAB Ectopic Multiple Live Births   2 0 0 0 1       Home Medications    Prior to Admission medications   Medication Sig Start Date End Date Taking? Authorizing Provider  albuterol (PROVENTIL HFA;VENTOLIN HFA) 108 (90 BASE) MCG/ACT inhaler Inhale 1-2 puffs into the lungs every 6 (six) hours as needed for wheezing or shortness of breath.   Yes [provider]  divalproex (DEPAKOTE ER) 500 MG 24 hr tablet Take 500 mg by mouth  2 (two) times daily.   Yes [provider]  FLUoxetine (PROZAC) 20 MG tablet Take 40 mg by mouth daily.   Yes [provider]  hydrOXYzine (VISTARIL) 25 MG capsule Take 25 mg by mouth every 8 (eight) hours as needed for anxiety.    Yes [provider]  ibuprofen (ADVIL,MOTRIN) 600 MG tablet Take 1 tablet (600 mg total) by mouth every 6 (six) hours as needed. Patient taking differently: Take 600 mg by mouth every 6 (six) hours as needed for moderate pain.  03/19/17  Yes Fayrene Helperran, [place], PA-C  ranitidine (ZANTAC) 150 MG tablet Take 150 mg by mouth daily as needed for heartburn.    Yes [provider]  cephALEXin (KEFLEX) 500 MG capsule Take 1 capsule (500 mg total) by mouth 3 (three) times daily. 05/03/17   Dejia Ebron, Mayer Maskerourtney F, MD  naproxen (NAPROSYN) 500 MG tablet Take 1 tablet (500 mg total) by mouth 2 (two) times daily. 05/03/17   Elbridge Magowan, Mayer Maskerourtney F, MD    Family History Family History  Problem Relation Age of Onset  . Stroke Mother   . Hyperlipidemia Other     Social History Social History  Substance Use Topics  . Smoking status: Current Every Day Smoker    Packs/day: 0.20    Years: 13.00  Types: Cigarettes  . Smokeless tobacco: Never Used  . Alcohol use Yes     Comment: Drinks once a month     Allergies   Coconut flavor; Naproxen; and Bee venom   Review of Systems Review of Systems  Constitutional: Negative for fever.  Respiratory: Negative for shortness of breath.   Cardiovascular: Negative for chest pain.  Gastrointestinal: Positive for abdominal pain and diarrhea. Negative for nausea and vomiting.  Genitourinary: Positive for flank pain. Negative for hematuria and vaginal discharge.  All other systems reviewed and are negative.    Physical Exam Updated Vital Signs BP 104/61 (BP Location: Left Arm)   Pulse 71   Temp 98.2 F (36.8 C) (Oral)   Resp 18   Ht 5\' 5"  (1.651 m)   Wt 90.7 kg (200 lb)   LMP 04/25/2017   SpO2 98%   BMI  33.28 kg/m   Physical Exam  Constitutional: She is oriented to person, place, and time. She appears well-developed and well-nourished.  Obese  HENT:  Head: Normocephalic and atraumatic.  Poor dentition  Cardiovascular: Normal rate, regular rhythm and normal heart sounds.   Pulmonary/Chest: Effort normal and breath sounds normal. No respiratory distress. She has no wheezes.  Abdominal: Soft. Bowel sounds are normal. She exhibits no mass. There is tenderness. There is no guarding.  Right lower quadrant and right CVA tenderness to palpation  Neurological: She is alert and oriented to person, place, and time.  Skin: Skin is warm and dry.  Psychiatric: She has a normal mood and affect.  Nursing note and vitals reviewed.    ED Treatments / Results  Labs (all labs ordered are listed, but only abnormal results are displayed) Labs Reviewed  COMPREHENSIVE METABOLIC PANEL - Abnormal; Notable for the following:       Result Value   Calcium 8.7 (*)    All other components within normal limits  URINALYSIS, ROUTINE W REFLEX MICROSCOPIC - Abnormal; Notable for the following:    APPearance CLOUDY (*)    Hgb urine dipstick TRACE (*)    Bilirubin Urine SMALL (*)    Ketones, ur 15 (*)    Protein, ur 30 (*)    Nitrite POSITIVE (*)    Leukocytes, UA TRACE (*)    All other components within normal limits  URINALYSIS, MICROSCOPIC (REFLEX) - Abnormal; Notable for the following:    Bacteria, UA FEW (*)    Squamous Epithelial / LPF 6-30 (*)    All other components within normal limits  URINE CULTURE  LIPASE, BLOOD  CBC  PREGNANCY, URINE    EKG  EKG Interpretation None       Radiology Ct Renal Stone Study  Result Date: 05/03/2017 CLINICAL DATA:  Abdominal pain. Right lower quadrant abdominal pain. EXAM: CT ABDOMEN AND PELVIS WITHOUT CONTRAST TECHNIQUE: Multidetector CT imaging of the abdomen and pelvis was performed following the standard protocol without IV contrast. COMPARISON:  CT  04/29/2015 FINDINGS: Lower chest: The lung bases are clear. Hepatobiliary: No focal hepatic lesion allowing for lack contrast. Clips in the gallbladder fossa postcholecystectomy. No biliary dilatation. Pancreas: No ductal dilatation or inflammation. Spleen: Calcified granuloma in the spleen. Spleen is normal in size. Adrenals/Urinary Tract: Normal adrenal glands. Moderate hydronephrosis, the ureters decompressed. No urolithiasis. No left hydronephrosis. Urinary bladder is minimally distended, possible wall thickening at the dome. No bladder stone. Stomach/Bowel: Small hiatal hernia. Stomach is otherwise decompressed. Appendix appears normal. No evidence of bowel wall thickening, distention, or inflammatory changes. Vascular/Lymphatic: Normal caliber  abdominal aorta. No abdominal or pelvic adenopathy. Reproductive: Uterus and left ovary are normal. Post right oophorectomy. No adnexal mass. Other: No free air, free fluid, or intra-abdominal fluid collection. Musculoskeletal: There are no acute or suspicious osseous abnormalities. IMPRESSION: 1. Mild hydronephrosis. No urolithiasis. Findings may reflect pyelonephritis or recently passed stone. 2. Normal appendix. Electronically Signed   By: Rubye Oaks M.D.   On: 05/03/2017 02:05    Procedures Procedures (including critical care time)  Medications Ordered in ED Medications  morphine 4 MG/ML injection 4 mg (4 mg Intravenous Given 05/03/17 0113)  ondansetron (ZOFRAN) injection 4 mg (4 mg Intravenous Given 05/03/17 0116)  sodium chloride 0.9 % bolus 1,000 mL (0 mLs Intravenous Stopped 05/03/17 0325)  cefTRIAXone (ROCEPHIN) 1 g in dextrose 5 % 50 mL IVPB (0 g Intravenous Stopped 05/03/17 0325)  ketorolac (TORADOL) 30 MG/ML injection 30 mg (30 mg Intravenous Given 05/03/17 0234)     Initial Impression / Assessment and Plan / ED Course  I have reviewed the triage vital signs and the nursing notes.  Pertinent labs & imaging results that were available during my  care of the patient were reviewed by me and considered in my medical decision making (see chart for details).     Patient presents with right-sided flank pain and abdominal pain. She is nontoxic on exam. Vital signs reassuring. Afebrile. Urinalysis is nitrite positive with 6-30 white cells and few bacteria. She denies any urinary symptoms. However, given location of pain, patient could have a urinary tract infection. Urine culture was sent. Kidney stone is also consideration. Less likely appendicitis. Patient is status post right oopherectomy.  CT scan obtained. Normal appendix. She does have some right-sided hydronephrosis which could reflect UTI versus recently passed stone. I discussed this with the patient. Will treat for urinary tract infection. If this was a stone, I would expect her to begin to feel better. Naproxen as needed at home.  After history, exam, and medical workup I feel the patient has been appropriately medically screened and is safe for discharge home. Pertinent diagnoses were discussed with the patient. Patient was given return precautions.   Final Clinical Impressions(s) / ED Diagnoses   Final diagnoses:  Urinary tract infection without hematuria, site unspecified  Flank pain    New Prescriptions New Prescriptions   CEPHALEXIN (KEFLEX) 500 MG CAPSULE    Take 1 capsule (500 mg total) by mouth 3 (three) times daily.   NAPROXEN (NAPROSYN) 500 MG TABLET    Take 1 tablet (500 mg total) by mouth 2 (two) times daily.     Shon Baton, MD 05/03/17 [phone]

## 2017-05-03 NOTE — ED Notes (Signed)
Pt sts she cannot take Aleve (naproxen) because it makes her vomit.  Engaging MD.

## 2017-05-03 NOTE — Discharge Instructions (Signed)
You were seen to day for right flank pain and right abdominal pain. Your workup is notable for likely urinary tract infection. You may have early pyelonephritis versus a recently passed kidney stone. Make sure to stay hydrated. Naproxen as needed for pain. Take antibiotics as instructed. If you develop fevers or any new or worsening symptoms she should be reevaluated.

## 2017-05-05 LAB — URINE CULTURE: Culture: >=100000 — AB

## 2017-05-06 ENCOUNTER — Telehealth: Payer: Self-pay | Admitting: *Deleted

## 2017-05-06 NOTE — Telephone Encounter (Signed)
Post ED Visit - Positive Culture Follow-up  Culture report reviewed by antimicrobial stewardship pharmacist:  [x]  Enzo BiNathan Batchelder, Pharm.D. []  Celedonio MiyamotoJeremy Frens, Pharm.D., BCPS AQ-ID []  Garvin FilaMike Maccia, Pharm.D., BCPS []  Georgina PillionElizabeth Martin, Pharm.D., BCPS []  RemertonMinh Pham, [address] BoulevardPharm.D., BCPS, AAHIVP []  Estella HuskMichelle Turner, Pharm.D., BCPS, AAHIVP []  Lysle Pearlachel Rumbarger, PharmD, BCPS []  Casilda Carlsaylor Stone, PharmD, BCPS []  Pollyann SamplesAndy Johnston, PharmD, BCPS  Positive urine culture Treated with Cephalexin, organism sensitive to the same and no further patient follow-up is required at this time.  Virl AxeRobertson, Lailee Hoelzel [facility] 05/06/2017, 11:17 AM

## 2017-07-22 ENCOUNTER — Emergency Department (HOSPITAL_COMMUNITY)
Admission: EM | Admit: 2017-07-22 | Discharge: 2017-07-23 | Disposition: A | Discharge status: Other Acute Inpatient Hospital | Payer: Medicaid Other | Attending: Emergency Medicine | Admitting: Emergency Medicine

## 2017-07-22 ENCOUNTER — Encounter (HOSPITAL_COMMUNITY): Payer: Self-pay | Admitting: Emergency Medicine

## 2017-07-22 DIAGNOSIS — S6992XA Unspecified injury of left wrist, hand and finger(s), initial encounter: Secondary | ICD-10-CM | POA: Diagnosis present

## 2017-07-22 DIAGNOSIS — N6320 Unspecified lump in the left breast, unspecified quadrant: Secondary | ICD-10-CM | POA: Diagnosis not present

## 2017-07-22 DIAGNOSIS — Y999 Unspecified external cause status: Secondary | ICD-10-CM | POA: Diagnosis not present

## 2017-07-22 DIAGNOSIS — Y929 Unspecified place or not applicable: Secondary | ICD-10-CM | POA: Diagnosis not present

## 2017-07-22 DIAGNOSIS — Y9389 Activity, other specified: Secondary | ICD-10-CM | POA: Diagnosis not present

## 2017-07-22 DIAGNOSIS — J45909 Unspecified asthma, uncomplicated: Secondary | ICD-10-CM | POA: Insufficient documentation

## 2017-07-22 DIAGNOSIS — F419 Anxiety disorder, unspecified: Secondary | ICD-10-CM | POA: Diagnosis not present

## 2017-07-22 DIAGNOSIS — F17210 Nicotine dependence, cigarettes, uncomplicated: Secondary | ICD-10-CM | POA: Insufficient documentation

## 2017-07-22 DIAGNOSIS — R45851 Suicidal ideations: Secondary | ICD-10-CM | POA: Insufficient documentation

## 2017-07-22 DIAGNOSIS — R441 Visual hallucinations: Secondary | ICD-10-CM | POA: Diagnosis not present

## 2017-07-22 DIAGNOSIS — X788XXA Intentional self-harm by other sharp object, initial encounter: Secondary | ICD-10-CM | POA: Diagnosis not present

## 2017-07-22 DIAGNOSIS — Z79899 Other long term (current) drug therapy: Secondary | ICD-10-CM | POA: Diagnosis not present

## 2017-07-22 DIAGNOSIS — R440 Auditory hallucinations: Secondary | ICD-10-CM | POA: Insufficient documentation

## 2017-07-22 DIAGNOSIS — F329 Major depressive disorder, single episode, unspecified: Secondary | ICD-10-CM | POA: Insufficient documentation

## 2017-07-22 DIAGNOSIS — Z046 Encounter for general psychiatric examination, requested by authority: Secondary | ICD-10-CM | POA: Insufficient documentation

## 2017-07-22 DIAGNOSIS — Z9114 Patient's other noncompliance with medication regimen: Secondary | ICD-10-CM | POA: Diagnosis not present

## 2017-07-22 DIAGNOSIS — Z23 Encounter for immunization: Secondary | ICD-10-CM | POA: Diagnosis not present

## 2017-07-22 DIAGNOSIS — S60812A Abrasion of left wrist, initial encounter: Secondary | ICD-10-CM | POA: Diagnosis not present

## 2017-07-22 DIAGNOSIS — Z7289 Other problems related to lifestyle: Secondary | ICD-10-CM | POA: Diagnosis not present

## 2017-07-22 LAB — COMPREHENSIVE METABOLIC PANEL
ALBUMIN: 4.1 g/dL (ref 3.5–5.0)
ALT: 22 U/L (ref 14–54)
ANION GAP: 7 (ref 5–15)
AST: 20 U/L (ref 15–41)
Alkaline Phosphatase: 75 U/L (ref 38–126)
BILIRUBIN TOTAL: 0.6 mg/dL (ref 0.3–1.2)
BUN: 8 mg/dL (ref 6–20)
CO2: 23 mmol/L (ref 22–32)
Calcium: 9.1 mg/dL (ref 8.9–10.3)
Chloride: 109 mmol/L (ref 101–111)
Creatinine, Ser: 0.7 mg/dL (ref 0.44–1.00)
GFR calc Af Amer: >60 mL/min (ref >60)
GFR calc non Af Amer: >60 mL/min (ref >60)
GLUCOSE: 93 mg/dL (ref 65–99)
POTASSIUM: 3 mmol/L — AB (ref 3.5–5.1)
SODIUM: 139 mmol/L (ref 135–145)
TOTAL PROTEIN: 7.2 g/dL (ref 6.5–8.1)

## 2017-07-22 LAB — CBC
HEMATOCRIT: 37.9 % (ref 36.0–46.0)
Hemoglobin: 13.2 g/dL (ref 12.0–15.0)
MCH: 32.7 pg (ref 26.0–34.0)
MCHC: 34.8 g/dL (ref 30.0–36.0)
MCV: 93.8 fL (ref 78.0–100.0)
PLATELETS: 205 10*3/uL (ref 150–400)
RBC: 4.04 MIL/uL (ref 3.87–5.11)
RDW: 12.7 % (ref 11.5–15.5)
WBC: 5.9 10*3/uL (ref 4.0–10.5)

## 2017-07-22 LAB — RAPID URINE DRUG SCREEN, HOSP PERFORMED
Amphetamines: NOT DETECTED
BARBITURATES: NOT DETECTED
Benzodiazepines: POSITIVE — AB
Cocaine: POSITIVE — AB
Opiates: NOT DETECTED
Tetrahydrocannabinol: POSITIVE — AB

## 2017-07-22 LAB — SALICYLATE LEVEL: Salicylate Lvl: <7 mg/dL (ref 2.8–30.0)

## 2017-07-22 LAB — ACETAMINOPHEN LEVEL: Acetaminophen (Tylenol), Serum: <10 ug/mL — ABNORMAL LOW (ref 10–30)

## 2017-07-22 LAB — ETHANOL

## 2017-07-22 MED ORDER — TRAZODONE HCL 100 MG PO TABS: 100.0000 mg | ORAL_TABLET | Freq: Every evening | ORAL | Status: DC | PRN

## 2017-07-22 MED ORDER — BACITRACIN ZINC 500 UNIT/GM EX OINT
TOPICAL_OINTMENT | Freq: Every day | CUTANEOUS | Status: DC
  Filled 2017-07-22: qty 28.35

## 2017-07-22 MED ORDER — CARBAMAZEPINE ER 200 MG PO TB12
200.0000 mg | ORAL_TABLET | Freq: Two times a day (BID) | ORAL | Status: DC
  Administered 2017-07-22: 200 mg via ORAL
  Filled 2017-07-22: qty 1

## 2017-07-22 MED ORDER — BACITRACIN ZINC 500 UNIT/GM EX OINT
TOPICAL_OINTMENT | Freq: Every day | CUTANEOUS | Status: DC
  Administered 2017-07-22: 1 via TOPICAL
  Filled 2017-07-22 (×10): qty 0.9

## 2017-07-22 MED ORDER — HYDROXYZINE HCL 25 MG PO TABS
25.0000 mg | ORAL_TABLET | Freq: Three times a day (TID) | ORAL | Status: DC | PRN
  Administered 2017-07-22: 25 mg via ORAL
  Filled 2017-07-22: qty 1

## 2017-07-22 MED ORDER — ESCITALOPRAM OXALATE 10 MG PO TABS
5.0000 mg | ORAL_TABLET | Freq: Every day | ORAL | Status: DC
  Administered 2017-07-22: 5 mg via ORAL
  Filled 2017-07-22: qty 1

## 2017-07-22 MED ORDER — RISPERIDONE 0.5 MG PO TABS
0.5000 mg | ORAL_TABLET | Freq: Every day | ORAL | Status: DC
  Administered 2017-07-22: 0.5 mg via ORAL
  Filled 2017-07-22: qty 1

## 2017-07-22 MED ORDER — RANITIDINE HCL 150 MG PO TABS
150.0000 mg | ORAL_TABLET | Freq: Two times a day (BID) | ORAL | Status: DC | PRN
  Administered 2017-07-22: 150 mg via ORAL
  Filled 2017-07-22: qty 1

## 2017-07-22 MED ORDER — POTASSIUM CHLORIDE CRYS ER 20 MEQ PO TBCR
40.0000 meq | EXTENDED_RELEASE_TABLET | Freq: Once | ORAL | Status: AC
  Administered 2017-07-22: 40 meq via ORAL
  Filled 2017-07-22: qty 2

## 2017-07-22 MED ORDER — ACETAMINOPHEN 500 MG PO TABS
1000.0000 mg | ORAL_TABLET | Freq: Four times a day (QID) | ORAL | Status: DC | PRN
  Administered 2017-07-22 (×2): 1000 mg via ORAL
  Filled 2017-07-22 (×3): qty 2

## 2017-07-22 MED ORDER — TETANUS-DIPHTH-ACELL PERTUSSIS 5-2.5-18.5 LF-MCG/0.5 IM SUSP
0.5000 mL | Freq: Once | INTRAMUSCULAR | Status: AC
  Administered 2017-07-22: 0.5 mL via INTRAMUSCULAR
  Filled 2017-07-22: qty 0.5

## 2017-07-22 NOTE — ED Notes (Signed)
Hourly rounding reveals patient sleeping in room. No complaints, stable, in no acute distress. Q15 minute rounds and monitoring via Security Cameras to continue. 
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
## 2017-07-22 NOTE — ED Notes (Signed)
TTS at bedside. 

## 2017-07-22 NOTE — ED Notes (Signed)
Presents with SI, pt cut Left wrist, dressing in place.  Denies HI or AVH.  Feeling paranoid and hopeless.  Pt reports stressed at home over the holidays, recent deaths in family. Feels like giving up.  A&O x 3, no distress noted,  Monitoring for safety, Q 15 min checks in effect.  Safety check for contraband completed, no items found.

## 2017-07-22 NOTE — ED Notes (Signed)
Hourly rounding reveals patient in room. Stable, in no acute distress. Q15 minute rounds and monitoring via Security Cameras to continue. 

## 2017-07-22 NOTE — ED Provider Notes (Signed)
[facility]-EMERGENCY DEPT Provider Note   CSN: [id number] Arrival date & time: 07/22/17  1519     History   Chief Complaint Chief Complaint  Patient presents with  . Suicidal  . Wrist Pain    HPI [name] is a 31 y.o. female.  HPI   31yo female with history of bipolar disorder, anxiety, depression, presents with concern for suicidal ideation and worsening depression. Also acknowledges months of auditory and visual hallucinations.  Reports plan to cut herself or overdose on pills and presents today with superficial cuts/abrasions to left wrist.  Reports she has had worsening depression over months, however over the last few days has developed increasing suicidal ideation and plan.  Reports she feels hopeless.  Reports she cut herself today because she is under significant stress and did not want to live.  Reports she grabbed a razor that was not clean.  Is not sure if she is UTD on tetanus vaccines.   Reports she stopped taking her psychiatric medications that were prescribed by [facility] a few months ago (depakote, prozac) because she did not think they were helping.  Reports she has been buying xanax off the street to help with her anxiety.  Reports taking it occasionally. Reports using cocaine and marijuana.  Reports months of auditory and visual hallucinations. She has been hospitalized in the past for SI.   Past Medical History:  Diagnosis Date  . Anxiety   . Asthma   . Bipolar 1 disorder (HCC)   . Depression   . Endometriosis   . Mental disorder     Patient Active Problem List   Diagnosis Date Noted  . Pelvic pain in female 01/15/2015  . MDD (major depressive disorder), recurrent episode, severe (HCC) 03/11/2014  . MDD (major depressive disorder) 03/11/2014  . Bipolar disorder with depression (HCC) 09/14/2013  . Major depressive disorder, recurrent episode, severe (HCC) 12/30/2011    Class: Stage 3  . Bipolar affective disorder, depressed,  severe (HCC) 12/30/2011    Class: Acute    Past Surgical History:  Procedure Laterality Date  . ABDOMINAL SURGERY     "For endometriosis"  . CHOLECYSTECTOMY    . OOPHORECTOMY Right 2012  . TUBAL LIGATION      OB History    Gravida Para Term Preterm AB Living   4 2 2  0 2 3   SAB TAB Ectopic Multiple Live Births   2 0 0 0 1       Home Medications    Prior to Admission medications   Medication Sig Start Date End Date Taking? Authorizing Provider  albuterol (PROVENTIL HFA;VENTOLIN HFA) 108 (90 BASE) MCG/ACT inhaler Inhale 1-2 puffs into the lungs every 6 (six) hours as needed for wheezing or shortness of breath.    [provider]  cephALEXin (KEFLEX) 500 MG capsule Take 1 capsule (500 mg total) by mouth 3 (three) times daily. 05/03/17   Horton, Mayer Maskerourtney F, MD  divalproex (DEPAKOTE ER) 500 MG 24 hr tablet Take 500 mg by mouth 2 (two) times daily.    [provider]  FLUoxetine (PROZAC) 20 MG tablet Take 40 mg by mouth daily.    [provider]  hydrOXYzine (VISTARIL) 25 MG capsule Take 25 mg by mouth every 8 (eight) hours as needed for anxiety.     [provider]  ibuprofen (ADVIL,MOTRIN) 600 MG tablet Take 1 tablet (600 mg total) by mouth every 6 (six) hours as needed. Patient taking differently: Take 600  mg by mouth every 6 (six) hours as needed for moderate pain.  03/19/17   Fayrene Helperran, Bowie, PA-C  naproxen (NAPROSYN) 500 MG tablet Take 1 tablet (500 mg total) by mouth 2 (two) times daily. 05/03/17   Horton, Mayer Maskerourtney F, MD  ranitidine (ZANTAC) 150 MG tablet Take 150 mg by mouth daily as needed for heartburn.     [provider]    Family History Family History  Problem Relation Age of Onset  . Stroke Mother   . Hyperlipidemia Other     Social History Social History   Tobacco Use  . Smoking status: Current Every Day Smoker    Packs/day: 0.20    Years: 13.00    Pack years: 2.60    Types: Cigarettes  . Smokeless tobacco: Never Used    Substance Use Topics  . Alcohol use: Yes    Comment: Drinks once a month  . Drug use: Yes    Types: Marijuana    Comment: daily     Allergies   Coconut flavor; Naproxen; and Bee venom   Review of Systems Review of Systems  Constitutional: Negative for fever.  HENT: Negative for sore throat.   Eyes: Negative for visual disturbance.  Respiratory: Negative for cough and shortness of breath.   Cardiovascular: Negative for chest pain.       Left breast pain   Gastrointestinal: Negative for abdominal pain.  Genitourinary: Negative for difficulty urinating.  Musculoskeletal: Negative for back pain and neck pain.  Skin: Positive for wound. Negative for rash.  Neurological: Negative for syncope and headaches.  Psychiatric/Behavioral: Positive for dysphoric mood, hallucinations, self-injury and suicidal ideas. The patient is nervous/anxious.      Physical Exam Updated Vital Signs BP (!) 138/93 (BP Location: Left Arm)   Pulse 78   Temp 98.7 F (37.1 C) (Oral)   Resp 20   SpO2 96%   Physical Exam  Constitutional: She is oriented to person, place, and time. She appears well-developed and well-nourished. No distress.  HENT:  Head: Normocephalic and atraumatic.  Eyes: Conjunctivae and EOM are normal.  Neck: Normal range of motion.  Cardiovascular: Normal rate, regular rhythm, normal heart sounds and intact distal pulses. Exam reveals no gallop and no friction rub.  No murmur heard. Pulmonary/Chest: Effort normal and breath sounds normal. No respiratory distress. She has no wheezes. She has no rales.  Cystic mass left breast tender to palpation, no fluctuance or erythema  Abdominal: Soft. She exhibits no distension. There is no tenderness. There is no guarding.  Musculoskeletal: She exhibits no edema or tenderness.  Neurological: She is alert and oriented to person, place, and time.  Skin: Skin is warm and dry. No rash noted. She is not diaphoretic. No erythema.  Linear  abrasions to left wrist horizontal and 1 vertical   Psychiatric: She exhibits a depressed mood. She expresses suicidal ideation. She expresses suicidal plans.  Nursing note and vitals reviewed.    ED Treatments / Results  Labs (all labs ordered are listed, but only abnormal results are displayed) Labs Reviewed  COMPREHENSIVE METABOLIC PANEL  ETHANOL  SALICYLATE LEVEL  ACETAMINOPHEN LEVEL  CBC  RAPID URINE DRUG SCREEN, HOSP PERFORMED  I-STAT BETA HCG BLOOD, ED ([facility], [facility], [facility] ONLY)    EKG  EKG Interpretation None       Radiology No results found.  Procedures Procedures (including critical care time)  Medications Ordered in ED Medications  Tdap (BOOSTRIX) injection 0.5 mL (not administered)  bacitracin ointment (not administered)  acetaminophen (TYLENOL) tablet 1,000 mg (not administered)  hydrOXYzine (VISTARIL) capsule 25 mg (not administered)     Initial Impression / Assessment and Plan / ED Course  I have reviewed the triage vital signs and the nursing notes.  Pertinent labs & imaging results that were available during my care of the patient were reviewed by me and considered in my medical decision making (see chart for details).    31yo female with history of bipolar disorder, anxiety, depression, presents with concern for suicidal ideation and worsening depression. Also acknowledges months of auditory and visual hallucinations.  Reports plan to cut herself or overdose on pills and presents today with superficial cuts/abrasions to left wrist.   Also reports breast pain, has no sign of abscess, recommend PCP and breast center follow up.  Given TDaP for wrist abrasions.   Labs show mild hypokalemia, given K.  She is medically cleared.   TTS consulted and recommend inpatient admission and patient to go to [facility].  Final Clinical Impressions(s) / ED Diagnoses   Final diagnoses:  Suicidal ideation  Deliberate self-cutting    ED Discharge Orders    None         Alvira Monday, MD 07/22/17 Rickey Primus

## 2017-07-22 NOTE — ED Triage Notes (Signed)
Pt reports she has been under a lot of stress at home. Pt depressed because the holidays are coming up and she is unable to see her kids. Pt also depressed the anniversery of her great-grandmother's death a year ago and the death of a friend's child. Pt also stressed because her father recently had a MI. Pt feels like giving up and is suicidal. Pt tried to cut her L wrist with a razor blade earlier today. Abrasion noted on L wrist. No laceration. Denies HI.  Uses marijuana at home. Took cocaine 3 days ago.

## 2017-07-22 NOTE — ED Notes (Signed)
Report to include situation, background, assessment and recommendations from Latrisha RN. Patient sleeping, respirations regular and unlabored. Q15 minute rounds and security camera observation to continue.   

## 2017-07-22 NOTE — ED Notes (Signed)
Hourly rounding reveals patient in room. No complaints, stable, in no acute distress. Q15 minute rounds and monitoring via Security Cameras to continue.Snack and beverage given. 

## 2017-07-22 NOTE — [facility] Assessment (Signed)
[facility] Assessment Progress Note   Case was staffed with Shaune PollackLord DNP who reccommended a inpatient admission. [facility] has accepted patient to [id number].

## 2017-07-23 ENCOUNTER — Encounter (HOSPITAL_COMMUNITY): Payer: Self-pay

## 2017-07-23 ENCOUNTER — Other Ambulatory Visit: Payer: Self-pay

## 2017-07-23 ENCOUNTER — Inpatient Hospital Stay (HOSPITAL_COMMUNITY)
Admission: AD | Admit: 2017-07-23 | Discharge: 2017-07-26 | DRG: 885 | Disposition: A | Discharge status: Home / Self Care | Payer: Medicaid Other | Source: Intra-hospital | Attending: Psychiatry | Admitting: Psychiatry

## 2017-07-23 DIAGNOSIS — E876 Hypokalemia: Secondary | ICD-10-CM | POA: Diagnosis present

## 2017-07-23 DIAGNOSIS — M549 Dorsalgia, unspecified: Secondary | ICD-10-CM | POA: Diagnosis not present

## 2017-07-23 DIAGNOSIS — Z915 Personal history of self-harm: Secondary | ICD-10-CM

## 2017-07-23 DIAGNOSIS — Z818 Family history of other mental and behavioral disorders: Secondary | ICD-10-CM

## 2017-07-23 DIAGNOSIS — F17210 Nicotine dependence, cigarettes, uncomplicated: Secondary | ICD-10-CM | POA: Diagnosis present

## 2017-07-23 DIAGNOSIS — F1210 Cannabis abuse, uncomplicated: Secondary | ICD-10-CM | POA: Diagnosis not present

## 2017-07-23 DIAGNOSIS — R45851 Suicidal ideations: Secondary | ICD-10-CM | POA: Diagnosis not present

## 2017-07-23 DIAGNOSIS — Z813 Family history of other psychoactive substance abuse and dependence: Secondary | ICD-10-CM | POA: Diagnosis not present

## 2017-07-23 DIAGNOSIS — F419 Anxiety disorder, unspecified: Secondary | ICD-10-CM | POA: Diagnosis present

## 2017-07-23 DIAGNOSIS — Z560 Unemployment, unspecified: Secondary | ICD-10-CM | POA: Diagnosis not present

## 2017-07-23 DIAGNOSIS — Z634 Disappearance and death of family member: Secondary | ICD-10-CM

## 2017-07-23 DIAGNOSIS — Z886 Allergy status to analgesic agent status: Secondary | ICD-10-CM | POA: Diagnosis not present

## 2017-07-23 DIAGNOSIS — Z91410 Personal history of adult physical and sexual abuse: Secondary | ICD-10-CM | POA: Diagnosis not present

## 2017-07-23 DIAGNOSIS — F333 Major depressive disorder, recurrent, severe with psychotic symptoms: Secondary | ICD-10-CM | POA: Diagnosis present

## 2017-07-23 DIAGNOSIS — Z23 Encounter for immunization: Secondary | ICD-10-CM | POA: Diagnosis not present

## 2017-07-23 DIAGNOSIS — J45909 Unspecified asthma, uncomplicated: Secondary | ICD-10-CM | POA: Diagnosis present

## 2017-07-23 DIAGNOSIS — F1990 Other psychoactive substance use, unspecified, uncomplicated: Secondary | ICD-10-CM

## 2017-07-23 DIAGNOSIS — Z62810 Personal history of physical and sexual abuse in childhood: Secondary | ICD-10-CM | POA: Diagnosis present

## 2017-07-23 DIAGNOSIS — F314 Bipolar disorder, current episode depressed, severe, without psychotic features: Principal | ICD-10-CM | POA: Diagnosis present

## 2017-07-23 DIAGNOSIS — F1994 Other psychoactive substance use, unspecified with psychoactive substance-induced mood disorder: Secondary | ICD-10-CM | POA: Diagnosis present

## 2017-07-23 MED ORDER — TRAZODONE HCL 50 MG PO TABS
50.0000 mg | ORAL_TABLET | Freq: Every evening | ORAL | Status: DC | PRN
  Administered 2017-07-24 – 2017-07-25 (×2): 50 mg via ORAL
  Filled 2017-07-23 (×3): qty 1

## 2017-07-23 MED ORDER — HYDROXYZINE HCL 25 MG PO TABS
25.0000 mg | ORAL_TABLET | Freq: Three times a day (TID) | ORAL | Status: DC | PRN
  Administered 2017-07-23 – 2017-07-26 (×6): 25 mg via ORAL
  Filled 2017-07-23 (×6): qty 1

## 2017-07-23 MED ORDER — RISPERIDONE 0.5 MG PO TABS
0.5000 mg | ORAL_TABLET | Freq: Every day | ORAL | Status: DC
  Administered 2017-07-23 – 2017-07-26 (×4): 0.5 mg via ORAL
  Filled 2017-07-23 (×8): qty 1

## 2017-07-23 MED ORDER — MAGNESIUM HYDROXIDE 400 MG/5ML PO SUSP: 30.0000 mL | Freq: Every day | ORAL | Status: DC | PRN

## 2017-07-23 MED ORDER — ACETAMINOPHEN 325 MG PO TABS
650.0000 mg | ORAL_TABLET | Freq: Four times a day (QID) | ORAL | Status: DC | PRN
  Administered 2017-07-23 – 2017-07-26 (×7): 650 mg via ORAL
  Filled 2017-07-23 (×7): qty 2

## 2017-07-23 MED ORDER — FAMOTIDINE 20 MG PO TABS
20.0000 mg | ORAL_TABLET | Freq: Two times a day (BID) | ORAL | Status: DC
  Administered 2017-07-23 – 2017-07-26 (×6): 20 mg via ORAL
  Filled 2017-07-23 (×12): qty 1

## 2017-07-23 MED ORDER — NICOTINE 21 MG/24HR TD PT24
21.0000 mg | MEDICATED_PATCH | Freq: Every day | TRANSDERMAL | Status: DC
  Administered 2017-07-23 – 2017-07-26 (×4): 21 mg via TRANSDERMAL
  Filled 2017-07-23 (×6): qty 1

## 2017-07-23 MED ORDER — INFLUENZA VAC SPLIT QUAD 0.5 ML IM SUSY
0.5000 mL | PREFILLED_SYRINGE | INTRAMUSCULAR | Status: AC
  Administered 2017-07-24: 0.5 mL via INTRAMUSCULAR
  Filled 2017-07-23: qty 0.5

## 2017-07-23 MED ORDER — POTASSIUM CHLORIDE 20 MEQ PO PACK: 20.0000 meq | PACK | Freq: Two times a day (BID) | ORAL | Status: DC

## 2017-07-23 MED ORDER — RISPERIDONE 1 MG PO TABS
1.0000 mg | ORAL_TABLET | Freq: Every day | ORAL | Status: DC
  Administered 2017-07-23 – 2017-07-25 (×3): 1 mg via ORAL
  Filled 2017-07-23 (×6): qty 1

## 2017-07-23 MED ORDER — ALUM & MAG HYDROXIDE-SIMETH 200-200-20 MG/5ML PO SUSP
30.0000 mL | ORAL | Status: DC | PRN
  Administered 2017-07-23 – 2017-07-24 (×3): 30 mL via ORAL
  Filled 2017-07-23 (×3): qty 30

## 2017-07-23 MED ORDER — PNEUMOCOCCAL VAC POLYVALENT 25 MCG/0.5ML IJ INJ
0.5000 mL | INJECTION | INTRAMUSCULAR | Status: AC
  Administered 2017-07-24: 0.5 mL via INTRAMUSCULAR

## 2017-07-23 MED ORDER — POTASSIUM CHLORIDE CRYS ER 20 MEQ PO TBCR
20.0000 meq | EXTENDED_RELEASE_TABLET | Freq: Two times a day (BID) | ORAL | Status: AC
  Administered 2017-07-23 – 2017-07-24 (×2): 20 meq via ORAL
  Filled 2017-07-23 (×4): qty 1
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
## 2017-07-23 NOTE — Progress Notes (Signed)
  DATA ACTION RESPONSE  Objective- Pt. is visible in the dayroom, seen interacting with peers.  Presents with an animated    affect and mood. Pt had plethora of c/o's. Pt states "trazodone jacks me up; Is there anyway I can get Ambien"? Pt appears med-seeking at times.  Subjective- Denies having any SI/HI/AVH at this time. Rate [place] 7/10; Headache.Is cooperative and remain safe on the unit.  1:1 interaction in private to establish rapport. Encouragement, education, & support given from staff.  PRN Maalox, tylenol, and vistaril requested and will re-eval accordingly.   Safety maintained with Q 15 checks. Continue with POC.
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
## 2017-07-23 NOTE — Plan of Care (Signed)
Patient is compliant with prescribed medication regimen. 
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
## 2017-07-23 NOTE — ED Notes (Signed)
Hourly rounding reveals patient sleeping in room. No complaints, stable, in no acute distress. Q15 minute rounds and monitoring via Security Cameras to continue. 

## 2017-07-23 NOTE — Progress Notes (Signed)
DAR NOTE: Patient presents with anxious affect and depressed mood.  Denies suicidal thoughts, auditory and visual hallucinations.  Rates depression at 9, hopelessness at 9, and anxiety at 9.  Maintained on routine safety checks.  Medications given as prescribed.  Support and encouragement offered as needed.  Attended group and participated.  States goal for today is "to get on the right med and get my life together."  Patient is visible in the dayroom and interacting with peers.  Tylenol given for complain of headache and Vistaril 25 mg given for anxiety with good effect.  Patient is safe on the unit.

## 2017-07-23 NOTE — Tx Team (Signed)
Initial Treatment Plan 07/23/2017 1:18 AM Elvin SoChristina L Sieben ZOX:[id number]    PATIENT STRESSORS: Financial difficulties Substance abuse Other: Loss of great grandmother three months ago  Lost niece a year ago/still born   PATIENT STRENGTHS: MetallurgistCommunication skills Financial means General fund of knowledge Physical Health Supportive family/friends   PATIENT IDENTIFIED PROBLEMS: Depression  Suicidal ideation  Substance abuse  "Get back on my medications"  "Get back in the right state of mind"             DISCHARGE CRITERIA:  Improved stabilization in mood, thinking, and/or behavior Verbal commitment to aftercare and medication compliance  PRELIMINARY DISCHARGE PLAN: Outpatient therapy medication management  PATIENT/FAMILY INVOLVEMENT: This treatment plan has been presented to and reviewed with the patient, Arnette L Shifrin.  The patient and family have been given the opportunity to ask questions and make suggestions.  Levin BaconHeather V Novalee Horsfall, RN 07/23/2017, 1:18 AM

## 2017-07-23 NOTE — BHH Group Notes (Signed)
[facility] LCSW Group Therapy Note  Date/Time:    07/23/2017 10:00-11:00AM  Type of Therapy and Topic:  Group Therapy:  Healthy vs Unhealthy Coping Skills  Participation Level:  Active   Description of Group:  The focus of this group was to determine what unhealthy coping techniques typically are used by group members and what healthy coping techniques would be helpful in coping with various problems.  In particular we addressed the unhealthy coping techniques typically used on a rainy, gloomy day like today, and what would be a healthier coping method to use. Patients were guided in becoming aware of the differences between healthy and unhealthy coping techniques.   Patients were encouraged to consider the changes they can make in their lives including taking medication, telling their doctor when their medicine is not working, introducing new hobbies, creating a schedule and many more.  Therapeutic Goals 1. Patients learned that coping is what human beings do all day long to deal with various situations in their lives 2. Patients defined and discussed healthy vs unhealthy coping techniques 3. Patients identified their preferred coping techniques and identified whether these were healthy or unhealthy 4. Patients determined 1-2 healthy coping skills they would like to become more familiar with and use more often 5. Patients provided support and ideas to each other  Summary of Patient Progress: During group, patient expressed that on a day like today, she would typically stay in bed and be in physical pain.  The option of using Tai Chi or meditation as a means of working with her pain was introduced, and she did not express opposition although she did indicate a preference to receiving pain medications and complained that she is not receiving them here in the hospital.   Therapeutic Modalities Cognitive Behavioral Therapy Motivational Interviewing   Selmer Dominion, LCSW 07/23/2017, 12:08  PM

## 2017-07-23 NOTE — Plan of Care (Signed)
Pt remains a low fall risk, denies SI/HI at this time.  

## 2017-07-23 NOTE — Progress Notes (Addendum)
Trula OreChristina is a 31 year old female being admitted voluntarily to [facility]-2 from [facility].  She came to the ED with suicidal ideation, attempting to cut wrist and substance abuse.  [facility] but stopped taking the medication when she relapsed on marijuana and cocaine.  She reported stressors as her great grandmothers passing three months ago, death of her niece (still born) one year ago and the holidays coming up.  She has history of multiple suicide attempts in the past that have required psychiatric hospitalizations.  She denied HI or A/V hallucinations.  She is diagnosed with Major Depressive Disorder and Polysubstance abuse.  [facility], she denies suicidal ideation and will contract for safety on the unit.  She denies HI or A/V hallucinations but did report that she has a history of hearing voices and last time was over a month ago.  She was c/o of head ache 8/10 but was given tylenol prior to leaving the ED.  She denies that she needs anything at this time.  Oriented her to the unit.  Admission paperwork completed and signed.  Belongings searched and secured in locker # 13.  Skin search completed and noted self inflicted, superficial cuts to left wrist and acne on back/chest.  Q 15 minute checks initiated for safety.  We will monitor the progress towards her goals.

## 2017-07-24 LAB — BASIC METABOLIC PANEL
ANION GAP: 6 (ref 5–15)
BUN: 10 mg/dL (ref 6–20)
CALCIUM: 8.9 mg/dL (ref 8.9–10.3)
CO2: 22 mmol/L (ref 22–32)
Chloride: 110 mmol/L (ref 101–111)
Creatinine, Ser: 0.72 mg/dL (ref 0.44–1.00)
Glucose, Bld: 107 mg/dL — ABNORMAL HIGH (ref 65–99)
Potassium: 3.7 mmol/L (ref 3.5–5.1)
SODIUM: 138 mmol/L (ref 135–145)

## 2017-07-24 MED ORDER — LIDOCAINE 5 % EX PTCH
1.0000 | MEDICATED_PATCH | Freq: Every day | CUTANEOUS | Status: DC
  Administered 2017-07-24 – 2017-07-26 (×3): 1 via TRANSDERMAL
  Filled 2017-07-24 (×5): qty 1

## 2017-07-24 MED ORDER — LORATADINE 10 MG PO TABS
10.0000 mg | ORAL_TABLET | Freq: Every day | ORAL | Status: DC
  Administered 2017-07-24 – 2017-07-26 (×3): 10 mg via ORAL
  Filled 2017-07-24 (×7): qty 1

## 2017-07-24 MED ORDER — ALBUTEROL SULFATE HFA 108 (90 BASE) MCG/ACT IN AERS
1.0000 | INHALATION_SPRAY | Freq: Four times a day (QID) | RESPIRATORY_TRACT | Status: DC | PRN
  Administered 2017-07-24 – 2017-07-26 (×4): 2 via RESPIRATORY_TRACT
  Filled 2017-07-24: qty 6.7

## 2017-07-24 NOTE — Progress Notes (Addendum)
Patient ID: [name], female   DOB: 01/07/1986, 31 y.o.   MRN: [id number]  Pt currently presents with an anxious affect and restless behavior. Pt reports ongoing anxiety, self harm behaviors and depression. Pt has a self inflicted scratch on L wrist that she scratched tonight. No further injury noted. Pt interacts positively with peers. Pt reports poor sleep with current medication regimen, refuses Trazodone initially. Pt reports that she feels her Risperdal has helped elevate her mood today. Pt reports increasing back pain [facility], requests to switch to hospital bed.   Pt provided with scheduled and as needed medications per providers orders. Pt's labs and vitals were monitored throughout the night. Pt given a 1:1 about emotional and mental status. Pt supported and encouraged to express concerns and questions. Pt educated on medications and alternative anxiety coping skills.  Pt's safety ensured with 15 minute and environmental checks. Pt currently denies SI/HI and A/V hallucinations. Pt verbally agrees to seek staff if SI/HI or A/VH occurs. Pt agrees to not harm herself, wrist covered with a bandage. Given alternative anxiety coping skills, pt verbalizes she will try these methods after speaking with staff member about any self harm thoughts. Pt [facility] beds [facility] Charge [facility]. Will continue POC.

## 2017-07-24 NOTE — Progress Notes (Signed)
D:  Patient's self inventory sheet, patient sleeps good, no sleep medication.  Good appetite, normal energy level, good concentration.  Rated depression 5, hopeless 2, anxiety 8.  Denied withdrawals.  Denied SI.  Physical problems, pain, back, worst pain #7 in past 24 hours.  Goal is to discharge.  Plans to do "everything".  Does have discharge plans. A:  Medications administered per MD orders.  Emotional support and encouragement given patient. R:  Denied SI and HI, contracts for safety. Denied A/V hallucinations.  Safety maintained with 15 minute checks.

## 2017-07-24 NOTE — Progress Notes (Signed)
Pt attend wrap up group.  Her day was a 5. Her goal think positive about herself.pt said she's  Ready to go home/

## 2017-07-24 NOTE — Progress Notes (Signed)
Nutrition Brief Note  Patient identified on the Malnutrition Screening Tool (MST) Report  Pt's weight is trending up.   Wt Readings from Last 15 Encounters:  07/23/17 196 lb (88.9 kg)  05/02/17 200 lb (90.7 kg)  02/22/16 180 lb (81.6 kg)  11/12/15 175 lb 12.8 oz (79.7 kg)  10/17/15 174 lb 12.8 oz (79.3 kg)  04/29/15 170 lb (77.1 kg)  04/03/15 177 lb 12.8 oz (80.6 kg)  01/15/15 177 lb 1.6 oz (80.3 kg)  01/06/15 176 lb (79.8 kg)  12/11/14 197 lb (89.4 kg)  03/11/14 199 lb (90.3 kg)  03/10/14 194 lb (88 kg)  09/14/13 190 lb (86.2 kg)  12/30/11 155 lb 8 oz (70.5 kg)  12/29/11 156 lb (70.8 kg)    Body mass index is 34.72 kg/m. Patient meets criteria for obesity based on current BMI.  Labs and medications reviewed.   No nutrition interventions warranted at this time. If nutrition issues arise, please consult RD.   Tilda FrancoLindsey Gerrie Castiglia, MS, RD, LDN [facility] Clinical Dietitian Pager: [phone] After Hours Pager: [phone]

## 2017-07-24 NOTE — Progress Notes (Signed)
[facility] MD Progress Note  07/24/2017 12:24 PM [name]  MRN:  [id number] Subjective:   31 y.o Caucasian female, single, lives with her grandmother, unemployed, on [place]. Background history of Early life trauma, Bipolar Disorder and SUD. Presented to the ER in company of her boyfriend. Lacerated her left wrist. Expressed hopelessness and worthlessness. stressed by not being able to see her children. Reports being bereaved recently. Routine labs significant for hypokalemia. UDS is positive for benzodiazepines, cocaine and THC. No alcohol.   Chart reviewed today. Patient discussed at team today.  Staff reports that she has been pleasant. She has not voiced any futility thoughts. She has not been observed to be internally distracted. No behavioral issues.  Seen today. Says she feels good on her medication. No wide effects. Reports history of allergy. Says she would like to have her inhaler as needed. cmplained of back pain. We have agreed to Lidoderm patch.  No hallucination in any modality. No other form of perceptual abnormality. No feelings of things being unreal. No feeling of impending doom. Not expressing any delusional statement. No suicidal thoughts. No homicidal thoughts. No thoughts of violence. Not expressing any new stressors. Says she has been in her current relationship for only three weeks.   Principal Problem: Bipolar affective disorder, depressed, severe ([place]) Diagnosis:   Patient Active Problem List   Diagnosis Date Noted  . Severe recurrent major depression with psychotic features ([place]) [F33.3] 07/23/2017  . Substance use disorder [F19.90] 07/23/2017  . Pelvic pain in female [R10.2] 01/15/2015  . MDD (major depressive disorder), recurrent episode, severe ([place]) [F33.2] 03/11/2014  . MDD (major depressive disorder) [F32.9] 03/11/2014  . Bipolar disorder with depression ([place]) [F31.30] 09/14/2013  . Major depressive disorder, recurrent episode, severe ([place]) [F33.2] 12/30/2011     Class: Stage 3  . Bipolar affective disorder, depressed, severe ([place]) [F31.4] 12/30/2011    Class: Acute   Total Time spent with patient: 20 minutes  Past Psychiatric History: As in H&P  Past Medical History:  Past Medical History:  Diagnosis Date  . Anxiety   . Asthma   . Bipolar 1 disorder ([place])   . Depression   . Endometriosis   . Mental disorder     Past Surgical History:  Procedure Laterality Date  . ABDOMINAL SURGERY     "For endometriosis"  . CHOLECYSTECTOMY    . OOPHORECTOMY Right 2012  . TUBAL LIGATION     Family History:  Family History  Problem Relation Age of Onset  . Stroke Mother   . Hyperlipidemia Other    Family Psychiatric  History: As in H&P Social History:  Social History   Substance and Sexual Activity  Alcohol Use Yes   Comment: Drinks once a month     Social History   Substance and Sexual Activity  Drug Use Yes  . Types: Marijuana   Comment: daily    Social History   Socioeconomic History  . Marital status: Legally Separated    Spouse name: None  . Number of children: None  . Years of education: None  . Highest education level: None  Social Needs  . Financial resource strain: None  . Food insecurity - worry: None  . Food insecurity - inability: None  . Transportation needs - medical: None  . Transportation needs - non-medical: None  Occupational History  . None  Tobacco Use  . Smoking status: Current Every Day Smoker    Packs/day: 0.50    Years: 13.00  Pack years: 6.50    Types: Cigarettes  . Smokeless tobacco: Never Used  Substance and Sexual Activity  . Alcohol use: Yes    Comment: Drinks once a month  . Drug use: Yes    Types: Marijuana    Comment: daily  . Sexual activity: Yes    Birth control/protection: Surgical  Other Topics Concern  . None  Social History Narrative  . None   Additional Social History:                         Sleep: Good  Appetite:  Good  Current  Medications: Current Facility-Administered Medications  Medication Dose Route Frequency Provider Last Rate Last Dose  . acetaminophen (TYLENOL) tablet 650 mg  650 mg Oral Q6H PRN Lindon Romp A, NP   650 mg at 07/24/17 1114  . alum & mag hydroxide-simeth (MAALOX/MYLANTA) 200-200-20 MG/5ML suspension 30 mL  30 mL Oral Q4H PRN Lindon Romp A, NP   30 mL at 07/23/17 2131  . famotidine (PEPCID) tablet 20 mg  20 mg Oral BID Nickey Canedo, Laruth Bouchard, MD   20 mg at 07/24/17 0738  . hydrOXYzine (ATARAX/VISTARIL) tablet 25 mg  25 mg Oral TID PRN Rozetta Nunnery, NP   25 mg at 07/24/17 0741  . magnesium hydroxide (MILK OF MAGNESIA) suspension 30 mL  30 mL Oral Daily PRN Lindon Romp A, NP      . nicotine (NICODERM CQ - dosed in mg/24 hours) patch 21 mg  21 mg Transdermal Daily Cobos, Myer Peer, MD   21 mg at 07/24/17 0736  . risperiDONE (RISPERDAL) tablet 0.5 mg  0.5 mg Oral Daily Kaydince Towles A, MD   0.5 mg at 07/24/17 0740  . risperiDONE (RISPERDAL) tablet 1 mg  1 mg Oral QHS Jerolyn Flenniken, Laruth Bouchard, MD   1 mg at 07/23/17 2130  . traZODone (DESYREL) tablet 50 mg  50 mg Oral QHS PRN Rozetta Nunnery, NP        Lab Results:  Results for orders placed or performed during the hospital encounter of 07/23/17 (from the past 48 hour(s))  Basic metabolic panel     Status: Abnormal   Collection Time: 07/24/17  6:40 AM  Result Value Ref Range   Sodium 138 135 - 145 mmol/L   Potassium 3.7 3.5 - 5.1 mmol/L   Chloride 110 101 - 111 mmol/L   CO2 22 22 - 32 mmol/L   Glucose, Bld 107 (H) 65 - 99 mg/dL   BUN 10 6 - 20 mg/dL   Creatinine, Ser 0.72 0.44 - 1.00 mg/dL   Calcium 8.9 8.9 - 10.3 mg/dL   GFR calc non Af Amer >60 >60 mL/min   GFR calc Af Amer >60 >60 mL/min    Comment: (NOTE) The eGFR has been calculated using the CKD EPI equation. This calculation has not been validated in all clinical situations. eGFR's persistently <60 mL/min signify possible Chronic Kidney Disease.    Anion gap 6 5 - 15    Comment:  Performed at [facility], [place] [address] [place], [place] [address]    Blood Alcohol level:  Lab Results  Component Value Date   [facility] <10 07/22/2017   ETH <11 34/19/6222    Metabolic Disorder Labs: No results found for: HGBA1C, MPG No results found for: PROLACTIN No results found for: CHOL, TRIG, HDL, CHOLHDL, VLDL, LDLCALC  Physical Findings: AIMS: Facial and Oral Movements Muscles of Facial Expression: None,  normal Lips and Perioral Area: None, normal Jaw: None, normal Tongue: None, normal,Extremity Movements Upper (arms, wrists, hands, fingers): None, normal Lower (legs, knees, ankles, toes): None, normal, Trunk Movements Neck, shoulders, hips: None, normal, Overall Severity Severity of abnormal movements (highest score from questions above): None, normal Incapacitation due to abnormal movements: None, normal Patient's awareness of abnormal movements (rate only patient's report): No Awareness, Dental Status Current problems with teeth and/or dentures?: No Does patient usually wear dentures?: No  CIWA:  CIWA-Ar Total: 1 COWS:  COWS Total Score: 2  Musculoskeletal: Strength & Muscle Tone: within normal limits Gait & Station: normal Patient leans: N/A  Psychiatric Specialty Exam: Physical Exam  Constitutional: She appears well-developed and well-nourished.  HENT:  Head: Normocephalic and atraumatic.  Respiratory: Effort normal.  Neurological: She is alert.  Psychiatric:  As above.     ROS  Blood pressure 109/63, pulse 91, temperature 97.6 F (36.4 C), temperature source Oral, resp. rate 18, height _0  (1.6 m), weight 88.9 kg (196 lb).Body mass index is 34.72 kg/m.  General Appearance: Pleasant and engaged well. Good hygiene and grooming. Not internally distracted.   Eye Contact:  Good  Speech:  Clear and Coherent and Normal Rate  Volume:  Normal  Mood:  Euthymic  Affect:  Appropriate and Full Range  Thought Process:  Linear  Orientation:   Full (Time, Place, and Person)  Thought Content:  No delusional theme. No preoccupation with violent thoughts. No negative ruminations. No obsession.  No hallucination in any modality.   Suicidal Thoughts:  No  Homicidal Thoughts:  No  Memory:  Immediate;   Good Recent;   Good Remote;   Good  Judgement:  Fair  Insight:  Good  Psychomotor Activity:  Normal  Concentration:  Concentration: Good  Recall:  Good  Fund of Knowledge:  Good  Language:  Good  Akathisia:  Negative  Handed:    AIMS (if indicated):     Assets:  Communication Skills Desire for Improvement Housing Intimacy Leisure Time Physical Health Resilience  ADL's:  Intact  Cognition:  WNL  Sleep:  Number of Hours: 6.75     Treatment Plan Summary: Patient feels good on her current regimen. She is not having any suicidal thoughts. She is not having any urge to mutilate self. No evidence of dissociation. No evidence of psychosis or depression. We plan to continue her current psychotropic medications at current dose. We would evaluate her further.  Psychiatric: Bipolar Disorder SUD SIMD  Medical:  Breast lump  Psychosocial:  Loss of custody Bereavement.   PLAN: 1. Continue medications at current dose 2. Continue to monitor mood, behavior and interaction with peers.    [place], MD 07/24/2017, 12:24 PM

## 2017-07-24 NOTE — BHH Group Notes (Signed)
[facility]  Date/Time:  07/24/2017 10:00-11:00AM  Type of Therapy and Topic:  Group Therapy:  Healthy and Unhealthy Supports  Participation Level:  Active   Description of Group:  Patients in this group were introduced to the idea of adding a variety of healthy supports to address the various needs in their lives. The picture on the front of Sunday's workbook was used to demonstrate why more supports are needed in every patient's life.  Patients identified and described healthy supports versus unhealthy supports in general, then gave examples of each in their own lives.   They discussed what additional healthy supports could be helpful in their recovery and wellness after discharge in order to prevent future hospitalizations.   An emphasis was placed on using counselor, doctor, therapy groups, 12-step groups, and problem-specific support groups to expand supports.  They also worked as a group on developing a specific plan for several patients to deal with unhealthy supports through boundary-setting, psychoeducation with loved ones, and even termination of relationships.   Therapeutic Goals:   1)  discuss importance of adding supports to stay well once out of the hospital  2)  compare healthy versus unhealthy supports and identify some examples of each  3)  generate ideas and descriptions of healthy supports that can be added  4)  offer mutual support about how to address unhealthy supports  5)  encourage active participation in and adherence to discharge plan    Summary of Patient Progress:  The patient shared that the current healthy supports available in her life are her family and boyfriend and herself, while the current unhealthy supports are her babies' daddies, who trigger her to anxiety and depression.  The patient expressed a willingness to add professional supports to help in her recovery journey.  A lot of other group members related well to the things she was  saying.   Therapeutic Modalities:   Motivational Interviewing Brief Solution-Focused Therapy  Ambrose MantleMareida Grossman-Orr, LCSW 07/24/2017, 11:00AM

## 2017-07-24 NOTE — Plan of Care (Signed)
Nurse discussed depression/anxiety/coping skills with patient. 

## 2017-07-25 MED ORDER — HYDROCORTISONE 1 % EX CREA
TOPICAL_CREAM | Freq: Two times a day (BID) | CUTANEOUS | Status: DC
  Administered 2017-07-25 – 2017-07-26 (×2): via TOPICAL
  Filled 2017-07-25 (×2): qty 28

## 2017-07-25 NOTE — Plan of Care (Signed)
Nurse discussed depression, anxiety, coping skills with patient.  

## 2017-07-25 NOTE — Progress Notes (Addendum)
D:  Patient's self inventory sheet, patient sleeps good, sleep medication helpful.  Good appetite, normal energy level, good concentration.  Rated depression 2, denied hopeless, anxiety 5.  Denied withdrawals.  Denied SI.  Physical problem, pain, back and neck, worst pain #8 in past 24 hours, no pain medicine.  Goal is discharge home.  Plans to stay on good behavior.  Does have discharge plans. A:  Medications administered per MD orders.  Emotional support and encouragement given patient. R:  Denied SI and HI, contracts for safety.  Denied A/V hallucinations.  Safety maintained with 15 minute checks. Patient has been in dayroom, coloring, talking to peers.  Patient has been needy, demanding, somatic complaints that are not congruent with behavior and affect.

## 2017-07-25 NOTE — Progress Notes (Signed)
Adult Psychoeducational Group Note  Date:  07/25/2017 Time:  9:28 PM  Group Topic/Focus:  Wrap-Up Group:   The focus of this group is to help patients review their daily goal of treatment and discuss progress on daily workbooks.  Participation Level:  Active  Participation Quality:  Appropriate  Affect:  Appropriate  Cognitive:  Appropriate  Insight: Appropriate, Good and Improving  Engagement in Group:  Engaged  Modes of Intervention:  Discussion  Additional Comments:  Pt stated her goal for today was to talk with her doctor about her discharge plan. Pt stated she accomplished her goal today and she will be discharged tomorrow. Pt rated her over all day a 9 out of 10. Pt stated she attended all groups held today.  Diane FurnaceChristopher  [name] 07/25/2017, 9:28 PM

## 2017-07-25 NOTE — Progress Notes (Signed)
Pt attend wrap up group. Her day waas a 9. Her goal to have a better day than she has had  Since she been here.

## 2017-07-25 NOTE — Progress Notes (Signed)
Recreation Therapy Notes  Date: 07/25/17 Time:  0930 Location: 300 Hall Dayroom  Group Topic: Stress Management  Goal Area(s) Addresses:  Patient will verbalize importance of using healthy stress management.  Patient will identify positive emotions associated with healthy stress management.   Behavioral Response: Engaged  Intervention: Stress Management  Activity : Guided Imagery.  LRT introduced the stress management technique of guided imagery.  LRT read a script that allowed patients to envision listening to the waves at the beach.  Patients were to follow along as LRT read script.  Education:  Stress Management, Discharge Planning.   Education Outcome: Acknowledges edcuation/In group clarification offered/Needs additional education  Clinical Observations/Feedback: Pt attended group.   Caroll RancherMarjette Helio Lack, LRT/CTRS         Caroll RancherLindsay, Netanel Yannuzzi A 07/25/2017 12:11 PM

## 2017-07-25 NOTE — Progress Notes (Addendum)
[facility] MD Progress Note  07/25/2017 11:21 AM [name]  MRN:  [id number] Subjective:   31 y.o Caucasian female, single, lives with her grandmother, unemployed, on [place]. Background history of Early life trauma, Bipolar Disorder and SUD. Presented to the ER in company of her boyfriend. Lacerated her left wrist. Expressed hopelessness and worthlessness. stressed by not being able to see her children. Reports being bereaved recently. Routine labs significant for hypokalemia. UDS is positive for benzodiazepines, cocaine and THC. No alcohol.   Chart reviewed today. Patient discussed at team today.  Nursing staff reports that patient has been appropriate on the unit. Patient has been interacting well with peers. No behavioral issues. Patient has not voiced any suicidal thoughts. Patient has not been observed to be internally stimulated. Patient has been adherent with treatment recommendations. Patient has been tolerating their medication well. Yesterday she scratched her wound.   Seen today. Says she is doing well on her current medication. She has not had any suicidal thoughts lately. No urge to hurt herself. I explored reports that she was scratching her wound yesterday. Patient says it itches as it is healing. Says she was not trying to re-traumatize self. No urge to mutilate.  No hallucination in any modality. No other form of perceptual abnormality. No feelings of things being unreal. No feeling of impending doom. Not expressing any delusional statement. No suicidal thoughts. No homicidal thoughts. No thoughts of violence. Not expressing any new stressors at home. She wants to spend one more day and go home tomorrow.    Principal Problem: Bipolar affective disorder, depressed, severe ([place]) Diagnosis:   Patient Active Problem List   Diagnosis Date Noted  . Severe recurrent major depression with psychotic features ([place]) [F33.3] 07/23/2017  . Substance use disorder [F19.90] 07/23/2017  . Pelvic  pain in female [R10.2] 01/15/2015  . MDD (major depressive disorder), recurrent episode, severe ([place]) [F33.2] 03/11/2014  . MDD (major depressive disorder) [F32.9] 03/11/2014  . Bipolar disorder with depression ([place]) [F31.30] 09/14/2013  . Major depressive disorder, recurrent episode, severe ([place]) [F33.2] 12/30/2011    Class: Stage 3  . Bipolar affective disorder, depressed, severe ([place]) [F31.4] 12/30/2011    Class: Acute   Total Time spent with patient: 20 minutes  Past Psychiatric History: As in H&P  Past Medical History:  Past Medical History:  Diagnosis Date  . Anxiety   . Asthma   . Bipolar 1 disorder ([place])   . Depression   . Endometriosis   . Mental disorder     Past Surgical History:  Procedure Laterality Date  . ABDOMINAL SURGERY     "For endometriosis"  . CHOLECYSTECTOMY    . OOPHORECTOMY Right 2012  . TUBAL LIGATION     Family History:  Family History  Problem Relation Age of Onset  . Stroke Mother   . Hyperlipidemia Other    Family Psychiatric  History: As in H&P Social History:  Social History   Substance and Sexual Activity  Alcohol Use Yes   Comment: Drinks once a month     Social History   Substance and Sexual Activity  Drug Use Yes  . Types: Marijuana   Comment: daily    Social History   Socioeconomic History  . Marital status: Legally Separated    Spouse name: None  . Number of children: None  . Years of education: None  . Highest education level: None  Social Needs  . Financial resource strain: None  . Food insecurity - worry: None  .  Food insecurity - inability: None  . Transportation needs - medical: None  . Transportation needs - non-medical: None  Occupational History  . None  Tobacco Use  . Smoking status: Current Every Day Smoker    Packs/day: 0.50    Years: 13.00    Pack years: 6.50    Types: Cigarettes  . Smokeless tobacco: Never Used  Substance and Sexual Activity  . Alcohol use: Yes    Comment: Drinks once a  month  . Drug use: Yes    Types: Marijuana    Comment: daily  . Sexual activity: Yes    Birth control/protection: Surgical  Other Topics Concern  . None  Social History Narrative  . None   Additional Social History:                         Sleep: Good  Appetite:  Good  Current Medications: Current Facility-Administered Medications  Medication Dose Route Frequency Provider Last Rate Last Dose  . acetaminophen (TYLENOL) tablet 650 mg  650 mg Oral Q6H PRN Lindon Romp A, NP   650 mg at 07/25/17 0643  . albuterol (PROVENTIL HFA;VENTOLIN HFA) 108 (90 Base) MCG/ACT inhaler 1-2 puff  1-2 puff Inhalation Q6H PRN Zalma Channing, Laruth Bouchard, MD   2 puff at 07/25/17 1022  . alum & mag hydroxide-simeth (MAALOX/MYLANTA) 200-200-20 MG/5ML suspension 30 mL  30 mL Oral Q4H PRN Lindon Romp A, NP   30 mL at 07/24/17 1534  . famotidine (PEPCID) tablet 20 mg  20 mg Oral BID Artist Beach, MD   20 mg at 07/25/17 0804  . hydrOXYzine (ATARAX/VISTARIL) tablet 25 mg  25 mg Oral TID PRN Lindon Romp A, NP   25 mg at 07/25/17 1023  . lidocaine (LIDODERM) 5 % 1 patch  1 patch Transdermal Daily Ivis Nicolson, Laruth Bouchard, MD   1 patch at 07/25/17 0804  . loratadine (CLARITIN) tablet 10 mg  10 mg Oral Daily Jaaziah Schulke, Laruth Bouchard, MD   10 mg at 07/25/17 0804  . magnesium hydroxide (MILK OF MAGNESIA) suspension 30 mL  30 mL Oral Daily PRN Lindon Romp A, NP      . nicotine (NICODERM CQ - dosed in mg/24 hours) patch 21 mg  21 mg Transdermal Daily Cobos, Myer Peer, MD   21 mg at 07/25/17 0805  . risperiDONE (RISPERDAL) tablet 0.5 mg  0.5 mg Oral Daily Emmer Lillibridge, Laruth Bouchard, MD   0.5 mg at 07/25/17 0804  . risperiDONE (RISPERDAL) tablet 1 mg  1 mg Oral QHS Kana Reimann, Laruth Bouchard, MD   1 mg at 07/24/17 2118  . traZODone (DESYREL) tablet 50 mg  50 mg Oral QHS PRN Rozetta Nunnery, NP   50 mg at 07/24/17 2300    Lab Results:  Results for orders placed or performed during the hospital encounter of 07/23/17 (from the past 48  hour(s))  Basic metabolic panel     Status: Abnormal   Collection Time: 07/24/17  6:40 AM  Result Value Ref Range   Sodium 138 135 - 145 mmol/L   Potassium 3.7 3.5 - 5.1 mmol/L   Chloride 110 101 - 111 mmol/L   CO2 22 22 - 32 mmol/L   Glucose, Bld 107 (H) 65 - 99 mg/dL   BUN 10 6 - 20 mg/dL   Creatinine, Ser 0.72 0.44 - 1.00 mg/dL   Calcium 8.9 8.9 - 10.3 mg/dL   GFR calc non Af Amer >60 >60 mL/min   GFR  calc Af Amer >60 >60 mL/min    Comment: (NOTE) The eGFR has been calculated using the CKD EPI equation. This calculation has not been validated in all clinical situations. eGFR's persistently <60 mL/min signify possible Chronic Kidney Disease.    Anion gap 6 5 - 15    Comment: Performed at [facility], [place] [address] [place],  [address]    Blood Alcohol level:  Lab Results  Component Value Date   [facility] <10 07/22/2017   ETH <11 37/05/6268    Metabolic Disorder Labs: No results found for: HGBA1C, MPG No results found for: PROLACTIN No results found for: CHOL, TRIG, HDL, CHOLHDL, VLDL, LDLCALC  Physical Findings: AIMS: Facial and Oral Movements Muscles of Facial Expression: None, normal Lips and Perioral Area: None, normal Jaw: None, normal Tongue: None, normal,Extremity Movements Upper (arms, wrists, hands, fingers): None, normal Lower (legs, knees, ankles, toes): None, normal, Trunk Movements Neck, shoulders, hips: None, normal, Overall Severity Severity of abnormal movements (highest score from questions above): None, normal Incapacitation due to abnormal movements: None, normal Patient's awareness of abnormal movements (rate only patient's report): No Awareness, Dental Status Current problems with teeth and/or dentures?: No Does patient usually wear dentures?: No  CIWA:  CIWA-Ar Total: 1 COWS:  COWS Total Score: 2  Musculoskeletal: Strength & Muscle Tone: within normal limits Gait & Station: normal Patient leans: N/A  Psychiatric  Specialty Exam: Physical Exam  Constitutional: She appears well-developed and well-nourished.  HENT:  Head: Normocephalic and atraumatic.  Respiratory: Effort normal.  Neurological: She is alert.  Psychiatric:  As above.     ROS  Blood pressure (!) 126/94, pulse 98, temperature (!) 97.5 F (36.4 C), temperature source Oral, resp. rate 18, height '5\' 3"'$  (1.6 m), weight 88.9 kg (196 lb).Body mass index is 34.72 kg/m.  General Appearance: Neatly dressed, pleasant, engaging well and cooperative. Appropriate behavior. Not in any distress. Good relatedness. Not internally stimulated  Eye Contact:  Good  Speech:  Spontaneous, normal prosody. Normal tone and rate.   Volume:  Normal  Mood:  Euthymic  Affect:  Appropriate and Full Range  Thought Process:  Linear  Orientation:  Full (Time, Place, and Person)  Thought Content:  No delusional theme. No preoccupation with violent thoughts. No negative ruminations. No obsession.  No hallucination in any modality.   Suicidal Thoughts:  No  Homicidal Thoughts:  No  Memory:  Immediate;   Good Recent;   Good Remote;   Good  Judgement:  Good  Insight:  Good  Psychomotor Activity:  Normal  Concentration:  Concentration: Good  Recall:  Good  Fund of Knowledge:  Good  Language:  Good  Akathisia:  Negative  Handed:    AIMS (if indicated):     Assets:  Communication Skills Desire for Improvement Housing Intimacy Leisure Time Physical Health Resilience  ADL's:  Intact  Cognition:  WNL  Sleep:  Number of Hours: 6     Treatment Plan Summary: Patient is at her baseline. No suicidal thoughts. No urge to mutilate self. No evidence of dissociation. No evidence of psychosis or depression. Hopeful discharge tomorrow if she maintains progress.   Psychiatric: Bipolar Disorder SUD SIMD  Medical:  Breast lump  Psychosocial:  Loss of custody Bereavement.   PLAN: 1. Continue medications at current dose 2. Continue to monitor mood,  behavior and interaction with peers.    Artist Beach, MD 07/25/2017, 11:21 AMPatient ID: [name], female   DOB: 12-17-1985, 31 y.o.  MRN: [id number]

## 2017-07-26 DIAGNOSIS — F17210 Nicotine dependence, cigarettes, uncomplicated: Secondary | ICD-10-CM

## 2017-07-26 DIAGNOSIS — F1210 Cannabis abuse, uncomplicated: Secondary | ICD-10-CM

## 2017-07-26 DIAGNOSIS — F1990 Other psychoactive substance use, unspecified, uncomplicated: Secondary | ICD-10-CM

## 2017-07-26 DIAGNOSIS — R45851 Suicidal ideations: Secondary | ICD-10-CM

## 2017-07-26 MED ORDER — LIDOCAINE 5 % EX PTCH: 1.0000 | MEDICATED_PATCH | Freq: Every day | CUTANEOUS | 0 refills | Status: DC

## 2017-07-26 MED ORDER — HYDROXYZINE HCL 25 MG PO TABS: 25.0000 mg | ORAL_TABLET | Freq: Three times a day (TID) | ORAL | 0 refills | Status: DC | PRN

## 2017-07-26 MED ORDER — RISPERIDONE 1 MG PO TABS: 1.0000 mg | ORAL_TABLET | Freq: Every day | ORAL | 0 refills | Status: DC

## 2017-07-26 MED ORDER — RISPERIDONE 0.5 MG PO TABS: 0.5000 mg | ORAL_TABLET | Freq: Every day | ORAL | 0 refills | Status: DC

## 2017-07-26 MED ORDER — TRAZODONE HCL 50 MG PO TABS: 50.0000 mg | ORAL_TABLET | Freq: Every evening | ORAL | 0 refills | Status: DC | PRN

## 2017-07-26 NOTE — [facility] Suicide Risk Assessment (Signed)
[facility]   Principal Problem: Bipolar affective disorder, depressed, severe ([facility]) Discharge Diagnoses:  Patient Active Problem List   Diagnosis Date Noted  . Severe recurrent major depression with psychotic features ([facility]) [F33.3] 07/23/2017  . Substance use disorder [F19.90] 07/23/2017  . Pelvic pain in female [R10.2] 01/15/2015  . MDD (major depressive disorder), recurrent episode, severe ([facility]) [F33.2] 03/11/2014  . MDD (major depressive disorder) [F32.9] 03/11/2014  . Bipolar disorder with depression ([facility]) [F31.30] 09/14/2013  . Major depressive disorder, recurrent episode, severe ([facility]) [F33.2] 12/30/2011    Class: Stage 3  . Bipolar affective disorder, depressed, severe ([facility]) [F31.4] 12/30/2011    Class: Acute    Total Time spent with patient: 30 minutes  Musculoskeletal: Strength & Muscle Tone: within normal limits Gait & Station: normal Patient leans: N/A  Psychiatric Specialty Exam: ROS denies headache, no chest pain, no shortness of breath, no vomiting , no diarrhea  Blood pressure 121/79, pulse 99, temperature 97.9 F (36.6 C), temperature source Oral, resp. rate 20, height 5\' 3"  (1.6 m), weight 88.9 kg (196 lb).Body mass index is 34.72 kg/m.  General Appearance: Well Groomed  Eye Contact::  Good  Speech:  Normal Rate409  Volume:  Normal  Mood:  reports improved mood, denies feeling depressed   Affect:  Appropriate and Full Range  Thought Process:  Linear and Descriptions of Associations: Intact  Orientation:  Full (Time, Place, and Person)  Thought Content:  denies hallucinations, no delusions   Suicidal Thoughts:  No denies any suicidal or self injurious ideations, denies any homicidal or violent ideations  Homicidal Thoughts:  No  Memory:  recent and remote grossly intact   Judgement:  Other:  improving   Insight:  improving   Psychomotor Activity:  Normal  Concentration:  Good  Recall:  Good  Fund of Knowledge:Good  Language:  Good  Akathisia:  Negative  Handed:  Right  AIMS (if indicated):   no abnormal or involuntary movements noted or reported   Assets:  Communication Skills Desire for Improvement Resilience  Sleep:  Number of Hours: 5.25  Cognition: WNL  ADL's:  Intact   Mental Status Per Nursing Assessment::   On Admission:  Self-harm thoughts  Demographic Factors:  [age] year old separated, lives with her grandmother, on SSI, has two children, who are living with their father   Loss Factors: Disability, not having custody of her children  Historical Factors: History of depression, history of prior admission, history of substance abuse   Risk Reduction Factors:   Sense of responsibility to family, Living with another person, especially a relative and Positive coping skills or problem solving skills  Continued Clinical Symptoms:  At this time patient is improved compared to admission- mood is improved and states she is not feeling depressed, affect is appropriate and reactive, no thought disorder, no suicidal ideations, no self injurious ideations , no homicidal or violent ideations, no psychotic symptoms, future oriented . Denies medication side effects at this time . Behavior on unit in good control,pleasant on approach   Cognitive Features That Contribute To Risk:  No gross cognitive deficits noted upon discharge. Is alert , attentive, and oriented x 3   Suicide Risk:  Mild:  Suicidal ideation of limited frequency, intensity, duration, and specificity.  There are no identifiable plans, no associated intent, mild dysphoria and related symptoms, good self-control (both objective and subjective assessment), few other risk factors, and identifiable protective factors, including available and accessible social support.  Follow-up Information  Services, [facility] Follow up.   Contact information: [address] [place] [address] [phone]           Plan Of  Care/Follow-up recommendations:  Activity:  as tolerated  Diet:  regular Tests:  NA Other:  See below   Patient is expressing readiness for discharge and is requesting discharge- there are no current grounds for involuntary commitment at this time Plans to return home- lives with grandmother Plans to follow up as above  Craige CottaFernando A Elfriede Bonini, MD 07/26/2017, 9:41 AM

## 2017-07-26 NOTE — Progress Notes (Signed)
Nursing Discharge Note 07/26/2017 [id number]  Data Reports sleeping good without PRN sleep med.  Rates depression 1/10, hopelessness 0/10, and anxiety 3/10. Affect anxious, appropriate.  Denies HI, SI, AVH.  Interacting well with staff and peers, spends free time in milieu.  Ongoing headache, anxiety which is managed well with PRNs and coping skills.  Received discharge orders.  Action Spoke with patient 1:1, nurse offered support to patient throughout shift.  Reviewed medications, discharge instructions, and follow up appointments with patient. Medication scripts reviewed and given to patient.  Paperwork, AVS, SRA, and transition record handed to patient.   Escorted off of unit at 1330. Belongings returned per belongings form.  Discharged to lobby where patient was met by family.    Response Verbalized understanding of discharge teaching. Agrees to contact someone or 911 with thoughts/intent to harm self or others.    To follow up per AVS.

## 2017-07-26 NOTE — Progress Notes (Signed)
D:  Diane OreChristina has been up and visible on the unit.  Participating in groups and interacting well with peers.  She denies SI/HI or A/V hallucinations.  She reported that she is looking forward to leaving tomorrow.  She continues to report pain from her pneumonia injection but the redness has reduced.  She did report that it is itching.  She took her medications as ordered.   A:  1:1 with RN for support and encouragement.  Medications as ordered.  Q 15 minute checks maintained for safety.  Encouraged continued participation in group and unit activities.   R:  [name] remains safe on the unit.  We will continue to monitor the progress towards her goals.
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
## 2017-07-26 NOTE — Tx Team (Signed)
Interdisciplinary Treatment and Diagnostic Plan Update  07/26/2017 Time of Session: 9:10 AM  [name] MRN: [id number]  Principal Diagnosis: Bipolar affective disorder, depressed, severe (HCC)  Secondary Diagnoses: Principal Problem:   Bipolar affective disorder, depressed, severe (HCC) Active Problems:   Severe recurrent major depression with psychotic features (HCC)   Substance use disorder   Current Medications:  Current Facility-Administered Medications  Medication Dose Route Frequency Provider Last Rate Last Dose  . acetaminophen (TYLENOL) tablet 650 mg  650 mg Oral Q6H PRN Berry, Jason A, NP   650 mg at 07/26/17 0413  . albuterol (PROVENTIL HFA;VENTOLIN HFA) 108 (90 Base) MCG/ACT inhaler 1-2 puff  1-2 puff Inhalation Q6H PRN Izediuno, Vincent A, MD   2 puff at 07/26/17 0800  . alum & mag hydroxide-simeth (MAALOX/MYLANTA) 200-200-20 MG/5ML suspension 30 mL  30 mL Oral Q4H PRN Berry, Jason A, NP   30 mL at 07/24/17 1534  . famotidine (PEPCID) tablet 20 mg  20 mg Oral BID Izediuno, Vincent A, MD   20 mg at 07/26/17 0800  . hydrocortisone cream 1 %   Topical BID Izediuno, Vincent A, MD      . hydrOXYzine (ATARAX/VISTARIL) tablet 25 mg  25 mg Oral TID PRN Berry, Jason A, NP   25 mg at 07/26/17 0802  . lidocaine (LIDODERM) 5 % 1 patch  1 patch Transdermal Daily Izediuno, Vincent A, MD   1 patch at 07/26/17 0800  . loratadine (CLARITIN) tablet 10 mg  10 mg Oral Daily Izediuno, Vincent A, MD   10 mg at 07/26/17 0800  . magnesium hydroxide (MILK OF MAGNESIA) suspension 30 mL  30 mL Oral Daily PRN Berry, Jason A, NP      . nicotine (NICODERM CQ - dosed in mg/24 hours) patch 21 mg  21 mg Transdermal Daily Cobos, Fernando A, MD   21 mg at 07/26/17 0759  . risperiDONE (RISPERDAL) tablet 0.5 mg  0.5 mg Oral Daily Izediuno, Vincent A, MD   0.5 mg at 07/26/17 0800  . risperiDONE (RISPERDAL) tablet 1 mg  1 mg Oral QHS Izediuno, Vincent A, MD   1 mg at 07/25/17 2134  . traZODone (DESYREL)  tablet 50 mg  50 mg Oral QHS PRN Berry, Jason A, NP   50 mg at 07/25/17 2135    PTA Medications: Medications Prior to Admission  Medication Sig Dispense Refill Last Dose  . acetaminophen (TYLENOL) 500 MG tablet Take 1,000-1,500 mg by mouth 2 (two) times daily as needed (PAIN).   07/21/2017 at Unknown time  . albuterol (PROVENTIL HFA;VENTOLIN HFA) 108 (90 BASE) MCG/ACT inhaler Inhale 1-2 puffs into the lungs every 6 (six) hours as needed for wheezing or shortness of breath.   07/16/2017  . Calcium-Magnesium-Vitamin D (CALCIUM 500 PO) Take 1 tablet by mouth daily.   Past Week at Unknown time  . cephALEXin (KEFLEX) 500 MG capsule Take 1 capsule (500 mg total) by mouth 3 (three) times daily. (Patient not taking: Reported on 07/22/2017) 21 capsule 0 Not Taking at Unknown time  . Cholecalciferol (VITAMIN D3) 1000 units CAPS Take 1 capsule by mouth daily.   Past Week at Unknown time  . hydrOXYzine (VISTARIL) 25 MG capsule Take 25 mg by mouth every 8 (eight) hours as needed for anxiety.    Not Taking at Unknown time  . ibuprofen (ADVIL,MOTRIN) 600 MG tablet Take 1 tablet (600 mg total) by mouth every 6 (six) hours as needed. (Patient not taking: Reported on 07/22/2017) 30 tablet 0   Not Taking at Unknown time  . naproxen (NAPROSYN) 500 MG tablet Take 1 tablet (500 mg total) by mouth 2 (two) times daily. (Patient not taking: Reported on 07/22/2017) 30 tablet 0 Not Taking at Unknown time  . Omega-3 Fatty Acids (FISH OIL PO) Take 1 tablet by mouth daily.   Past Week at Unknown time  . ranitidine (ZANTAC) 150 MG tablet Take 150 mg by mouth daily as needed for heartburn.    07/21/2017 at Unknown time  . VITAMIN E PO Take 1 tablet by mouth daily.   Past Week at Unknown time    Patient Stressors: Financial difficulties Substance abuse Other: Loss of great grandmother three months ago  Patient Strengths: Occupational psychologist fund of knowledge Physical Health Supportive  family/friends  Treatment Modalities: Medication Management, Group therapy, Case management,  1 to 1 session with clinician, Psychoeducation, Recreational therapy.   Physician Treatment Plan for Primary Diagnosis: Bipolar affective disorder, depressed, severe ([place]) Long Term Goal(s): Improvement in symptoms so as ready for discharge  Short Term Goals: Ability to identify changes in lifestyle to reduce recurrence of condition will improve Ability to verbalize feelings will improve Ability to disclose and discuss suicidal ideas Ability to demonstrate self-control will improve Ability to identify and develop effective coping behaviors will improve Ability to maintain clinical measurements within normal limits will improve Compliance with prescribed medications will improve Ability to identify triggers associated with substance abuse/mental health issues will improve Ability to identify changes in lifestyle to reduce recurrence of condition will improve Ability to verbalize feelings will improve Ability to disclose and discuss suicidal ideas Ability to demonstrate self-control will improve Ability to identify and develop effective coping behaviors will improve Ability to maintain clinical measurements within normal limits will improve Compliance with prescribed medications will improve Ability to identify triggers associated with substance abuse/mental health issues will improve  Medication Management: Evaluate patient's response, side effects, and tolerance of medication regimen.  Therapeutic Interventions: 1 to 1 sessions, Unit Group sessions and Medication administration.  Evaluation of Outcomes: Adequate for Discharge  Physician Treatment Plan for Secondary Diagnosis: Principal Problem:   Bipolar affective disorder, depressed, severe ([place]) Active Problems:   Severe recurrent major depression with psychotic features ([place])   Substance use disorder   Long Term Goal(s): Improvement  in symptoms so as ready for discharge  Short Term Goals: Ability to identify changes in lifestyle to reduce recurrence of condition will improve Ability to verbalize feelings will improve Ability to disclose and discuss suicidal ideas Ability to demonstrate self-control will improve Ability to identify and develop effective coping behaviors will improve Ability to maintain clinical measurements within normal limits will improve Compliance with prescribed medications will improve Ability to identify triggers associated with substance abuse/mental health issues will improve Ability to identify changes in lifestyle to reduce recurrence of condition will improve Ability to verbalize feelings will improve Ability to disclose and discuss suicidal ideas Ability to demonstrate self-control will improve Ability to identify and develop effective coping behaviors will improve Ability to maintain clinical measurements within normal limits will improve Compliance with prescribed medications will improve Ability to identify triggers associated with substance abuse/mental health issues will improve  Medication Management: Evaluate patient's response, side effects, and tolerance of medication regimen.  Therapeutic Interventions: 1 to 1 sessions, Unit Group sessions and Medication administration.  Evaluation of Outcomes: Adequate for Discharge   RN Treatment Plan for Primary Diagnosis: Bipolar affective disorder, depressed, severe ([place]) Long Term Goal(s): Knowledge of disease  and therapeutic regimen to maintain health will improve  Short Term Goals: Ability to identify and develop effective coping behaviors will improve and Compliance with prescribed medications will improve  Medication Management: RN will administer medications as ordered by provider, will assess and evaluate patient's response and provide education to patient for prescribed medication. RN will report any adverse and/or side effects  to prescribing provider.  Therapeutic Interventions: 1 on 1 counseling sessions, Psychoeducation, Medication administration, Evaluate responses to treatment, Monitor vital signs and CBGs as ordered, Perform/monitor CIWA, COWS, AIMS and Fall Risk screenings as ordered, Perform wound care treatments as ordered.  Evaluation of Outcomes: Adequate for Discharge   LCSW Treatment Plan for Primary Diagnosis: Bipolar affective disorder, depressed, severe ([place]) Long Term Goal(s): Safe transition to appropriate next level of care at discharge, Engage patient in therapeutic group addressing interpersonal concerns.  Short Term Goals: Engage patient in aftercare planning with referrals and resources  Therapeutic Interventions: Assess for all discharge needs, 1 to 1 time with Social worker, Explore available resources and support systems, Assess for adequacy in community support network, Educate family and significant other(s) on suicide prevention, Complete Psychosocial Assessment, Interpersonal group therapy.  Evaluation of Outcomes: Met  Return home, follow up outpt   Progress in Treatment: Attending groups: Yes Participating in groups: Yes Taking medication as prescribed: Yes Toleration medication: Yes, no side effects reported at this time Family/Significant other contact made: Yes Patient understands diagnosis: Yes AEB asking for help with SI, anxiety Discussing patient identified problems/goals with staff: Yes Medical problems stabilized or resolved: Yes Denies suicidal/homicidal ideation: Yes Issues/concerns per patient self-inventory: None Other: N/A  New problem(s) identified: None identified at this time.   New Short Term/Long Term Goal(s): "Get back in the right state of mind"  Discharge Plan or Barriers:   Reason for Continuation of Hospitalization:   Estimated Length of Stay: D/c today 11/27  Attendees: Patient: [name] 07/26/2017  9:10 AM  Physician: Neita Garnet, MD 07/26/2017  9:10 AM  Nursing: Sena Hitch, RN 07/26/2017  9:10 AM  RN Care Manager: Lars Pinks, RN 07/26/2017  9:10 AM  Social Worker: Ripley Fraise 07/26/2017  9:10 AM  Recreational Therapist: Winfield Cunas 07/26/2017  9:10 AM  Other: Norberto Sorenson 07/26/2017  9:10 AM  Other:  07/26/2017  9:10 AM    Scribe for Treatment Team:  Roque Lias LCSW 07/26/2017 9:10 AM

## 2017-07-26 NOTE — Progress Notes (Signed)
  [facility] :  Will you be returning to the same living situation after discharge:  Yes,  with grandmother At discharge, do you have transportation home?: Yes,  grandmother Do you have the ability to pay for your medications: Yes,  MCD  Release of information consent forms completed and in the chart;  Patient's signature needed at discharge.  Patient to Follow up at: Follow-up Information    Services, [facility] Follow up.   Why:  Please call them tomorrow to confirm your phone number.  They are checking your information and will set you up with an appointment when you call them. If they are unable to see you within the next 2 weeks, you will need to go [facility] in the meantime  Contact information: [address] [place] [address] [phone]           Next level of care provider has [facility] to [facility] Link:no  Safety Planning and Suicide Prevention discussed: Yes,  yes  Have you used any form of tobacco in the last 30 days? (Cigarettes, Smokeless Tobacco, Cigars, and/or Pipes): Yes  Has patient been referred to the Quitline?: Patient refused referral  Patient has been referred for addiction treatment: Pt. refused referral  Ida RogueRodney B Lavella Myren, LCSW 07/26/2017, 10:29 AM

## 2017-07-26 NOTE — [facility] Suicide Risk Assessment (Signed)
[facility] INPATIENT:  Family/Significant Other Suicide Prevention Education  Suicide Prevention Education:  Education Completed; Diane BertinLinda [name], grandmother, [phone] has been identified by the patient as the family member/significant other with whom the patient will be residing, and identified as the person(s) who will aid the patient in the event of a mental health crisis (suicidal ideations/suicide attempt).  With written consent from the patient, the family member/significant other has been provided the following suicide prevention education, prior to the and/or following the discharge of the patient.  The suicide prevention education provided includes the following:  Suicide risk factors  Suicide prevention and interventions  National Suicide Hotline telephone number  [facility] assessment telephone number  [facility] Emergency Assistance 911  [facility] and/or Residential Mobile Crisis Unit telephone number  Request made of family/significant other to:  Remove weapons (e.g., guns, rifles, knives), all items previously/currently identified as safety concern.    Remove drugs/medications (over-the-counter, prescriptions, illicit drugs), all items previously/currently identified as a safety concern.  The family member/significant other verbalizes understanding of the suicide prevention education information provided.  The family member/significant other agrees to remove the items of safety concern listed above.  Diane DaubRodney B [facility] [name] 07/26/2017, 10:32 AM

## 2017-08-03 ENCOUNTER — Other Ambulatory Visit: Payer: Self-pay

## 2017-08-03 ENCOUNTER — Encounter (HOSPITAL_COMMUNITY): Payer: Self-pay | Admitting: Emergency Medicine

## 2017-08-03 ENCOUNTER — Emergency Department (HOSPITAL_COMMUNITY)
Admission: EM | Admit: 2017-08-03 | Discharge: 2017-08-03 | Disposition: A | Discharge status: Home / Self Care | Payer: Medicaid Other | Attending: Emergency Medicine | Admitting: Emergency Medicine

## 2017-08-03 DIAGNOSIS — Z5321 Procedure and treatment not carried out due to patient leaving prior to being seen by health care provider: Secondary | ICD-10-CM | POA: Diagnosis not present

## 2017-08-03 DIAGNOSIS — N6320 Unspecified lump in the left breast, unspecified quadrant: Secondary | ICD-10-CM | POA: Diagnosis not present

## 2017-08-03 DIAGNOSIS — N63 Unspecified lump in unspecified breast: Secondary | ICD-10-CM | POA: Diagnosis present

## 2017-08-03 NOTE — ED Triage Notes (Signed)
Pt reports knot on left breast x1.5 months, denies any change in size. No discharge or drainage. Pt also reports she feels like her belly button is infected because its red and irritated. resp e/u, nad.

## 2018-03-01 DIAGNOSIS — G8929 Other chronic pain: Secondary | ICD-10-CM | POA: Insufficient documentation

## 2018-03-28 ENCOUNTER — Emergency Department (HOSPITAL_COMMUNITY): Payer: Medicaid Other

## 2018-03-28 ENCOUNTER — Emergency Department (HOSPITAL_COMMUNITY)
Admission: EM | Admit: 2018-03-28 | Discharge: 2018-03-28 | Disposition: A | Discharge status: Home / Self Care | Payer: Medicaid Other | Attending: Emergency Medicine | Admitting: Emergency Medicine

## 2018-03-28 ENCOUNTER — Encounter (HOSPITAL_COMMUNITY): Payer: Self-pay

## 2018-03-28 DIAGNOSIS — R102 Pelvic and perineal pain: Secondary | ICD-10-CM

## 2018-03-28 DIAGNOSIS — Z79899 Other long term (current) drug therapy: Secondary | ICD-10-CM | POA: Insufficient documentation

## 2018-03-28 DIAGNOSIS — F17210 Nicotine dependence, cigarettes, uncomplicated: Secondary | ICD-10-CM | POA: Insufficient documentation

## 2018-03-28 DIAGNOSIS — A5901 Trichomonal vulvovaginitis: Secondary | ICD-10-CM | POA: Diagnosis not present

## 2018-03-28 DIAGNOSIS — N730 Acute parametritis and pelvic cellulitis: Secondary | ICD-10-CM | POA: Diagnosis not present

## 2018-03-28 DIAGNOSIS — J45909 Unspecified asthma, uncomplicated: Secondary | ICD-10-CM | POA: Diagnosis not present

## 2018-03-28 LAB — URINALYSIS, ROUTINE W REFLEX MICROSCOPIC
Bacteria, UA: NONE SEEN
GLUCOSE, UA: NEGATIVE mg/dL
Hgb urine dipstick: NEGATIVE
KETONES UR: 5 mg/dL — AB
Nitrite: NEGATIVE
PROTEIN: 30 mg/dL — AB
Specific Gravity, Urine: 1.033 — ABNORMAL HIGH (ref 1.005–1.030)
pH: 5 (ref 5.0–8.0)

## 2018-03-28 LAB — CBC
HEMATOCRIT: 44.2 % (ref 36.0–46.0)
HEMOGLOBIN: 14.7 g/dL (ref 12.0–15.0)
MCH: 31.7 pg (ref 26.0–34.0)
MCHC: 33.3 g/dL (ref 30.0–36.0)
MCV: 95.3 fL (ref 78.0–100.0)
Platelets: 239 10*3/uL (ref 150–400)
RBC: 4.64 MIL/uL (ref 3.87–5.11)
RDW: 13.9 % (ref 11.5–15.5)
WBC: 9 10*3/uL (ref 4.0–10.5)

## 2018-03-28 LAB — COMPREHENSIVE METABOLIC PANEL
ALT: 27 U/L (ref 0–44)
AST: 22 U/L (ref 15–41)
Albumin: 4.3 g/dL (ref 3.5–5.0)
Alkaline Phosphatase: 61 U/L (ref 38–126)
Anion gap: 8 (ref 5–15)
BUN: 9 mg/dL (ref 6–20)
CHLORIDE: 110 mmol/L (ref 98–111)
CO2: 22 mmol/L (ref 22–32)
Calcium: 9.4 mg/dL (ref 8.9–10.3)
Creatinine, Ser: 0.78 mg/dL (ref 0.44–1.00)
GFR calc Af Amer: >60 mL/min (ref >60)
GFR calc non Af Amer: >60 mL/min (ref >60)
Glucose, Bld: 107 mg/dL — ABNORMAL HIGH (ref 70–99)
Potassium: 3.3 mmol/L — ABNORMAL LOW (ref 3.5–5.1)
Sodium: 140 mmol/L (ref 135–145)
Total Bilirubin: 0.8 mg/dL (ref 0.3–1.2)
Total Protein: 7.7 g/dL (ref 6.5–8.1)

## 2018-03-28 LAB — WET PREP, GENITAL
SPERM: NONE SEEN
Yeast Wet Prep HPF POC: NONE SEEN

## 2018-03-28 LAB — I-STAT BETA HCG BLOOD, ED (MC, WL, AP ONLY): I-stat hCG, quantitative: <5 m[IU]/mL (ref <5)

## 2018-03-28 LAB — LIPASE, BLOOD: LIPASE: 26 U/L (ref 11–51)

## 2018-03-28 MED ORDER — DOXYCYCLINE HYCLATE 100 MG PO CAPS: 100.0000 mg | ORAL_CAPSULE | Freq: Two times a day (BID) | ORAL | 0 refills | Status: DC

## 2018-03-28 MED ORDER — KETOROLAC TROMETHAMINE 60 MG/2ML IM SOLN
INTRAMUSCULAR | Status: AC
  Filled 2018-03-28: qty 2

## 2018-03-28 MED ORDER — LIDOCAINE HCL (PF) 1 % IJ SOLN
INTRAMUSCULAR | Status: AC
  Filled 2018-03-28: qty 5

## 2018-03-28 MED ORDER — KETOROLAC TROMETHAMINE 60 MG/2ML IM SOLN
60.0000 mg | Freq: Once | INTRAMUSCULAR | Status: AC
  Administered 2018-03-28: 60 mg via INTRAMUSCULAR

## 2018-03-28 MED ORDER — CEFTRIAXONE SODIUM 250 MG IJ SOLR
250.0000 mg | Freq: Once | INTRAMUSCULAR | Status: AC
  Administered 2018-03-28: 250 mg via INTRAMUSCULAR
  Filled 2018-03-28: qty 250

## 2018-03-28 MED ORDER — METRONIDAZOLE 500 MG PO TABS: 500.0000 mg | ORAL_TABLET | Freq: Two times a day (BID) | ORAL | 0 refills | Status: DC

## 2018-03-28 NOTE — ED Triage Notes (Signed)
Pt reports that for the past two days she has been having LLQ pain, pressure when she voids, denies dysuria, denies n/v/d/fevers

## 2018-03-28 NOTE — Discharge Instructions (Addendum)
Take doxycycline and Flagyl as prescribed.  Do not stop these antibiotics early.  You may continue with Tylenol or naproxen for management of pain.  Do not drink alcohol while taking Flagyl as it will make you violently ill, vomiting.  You tested positive for trichomonas today.  This is a sexually transmitted infection.  Your partner should be tested and treated for STDs as well.  Use a condom when sexually active, but do not engage in sexual intercourse until 1 week following completion of antibiotic treatment.  Follow-up with your OB/GYN regarding your visit today.  You may return for new or concerning symptoms.

## 2018-03-28 NOTE — ED Provider Notes (Signed)
[facility] EMERGENCY DEPARTMENT Provider Note   CSN: [id number] Arrival date & time: 03/28/18  0050     History   Chief Complaint Chief Complaint  Patient presents with  . Abdominal Pain    HPI [name] is a 32 y.o. female.  32 year old female with a history of anxiety, bipolar 1 disorder, depression, endometriosis presents to the emergency department for left lower quadrant abdominal pain.  Pain has been nonradiating and present for the past 2 days.  She has tried Tylenol for pain without relief.  She notes some increasing pressure when she voids, but has not had any dysuria or frequency.  No bowel changes.  Last bowel movement was earlier in the day yesterday.  She further denies nausea, vomiting, fevers.  Abdominal surgical history significant for cholecystectomy, tubal ligation, and right oophorectomy.  Denies concern for STDs as she states that she was tested for this earlier in the month.  She denies any changes in her sexual partner since this time.  The history is provided by the patient. No language interpreter was used.  Abdominal Pain      Past Medical History:  Diagnosis Date  . Anxiety   . Asthma   . Bipolar 1 disorder (HCC)   . Depression   . Endometriosis   . Mental disorder     Patient Active Problem List   Diagnosis Date Noted  . Severe recurrent major depression with psychotic features (HCC) 07/23/2017  . Substance use disorder 07/23/2017  . Pelvic pain in female 01/15/2015  . MDD (major depressive disorder), recurrent episode, severe (HCC) 03/11/2014  . MDD (major depressive disorder) 03/11/2014  . Bipolar disorder with depression (HCC) 09/14/2013  . Major depressive disorder, recurrent episode, severe (HCC) 12/30/2011    Class: Stage 3  . Bipolar affective disorder, depressed, severe (HCC) 12/30/2011    Class: Acute    Past Surgical History:  Procedure Laterality Date  . ABDOMINAL SURGERY     "For endometriosis"  .  CHOLECYSTECTOMY    . OOPHORECTOMY Right 2012  . TUBAL LIGATION       OB History    Gravida  4   Para  2   Term  2   Preterm  0   AB  2   Living  3     SAB  2   TAB  0   Ectopic  0   Multiple  0   Live Births  1            Home Medications    Prior to Admission medications   Medication Sig Start Date End Date Taking? Authorizing Provider  albuterol (PROVENTIL HFA;VENTOLIN HFA) 108 (90 BASE) MCG/ACT inhaler Inhale 1-2 puffs into the lungs every 6 (six) hours as needed for wheezing or shortness of breath.   Yes [provider]  cariprazine (VRAYLAR) capsule Take 3 mg by mouth daily.   Yes [provider]  cholecalciferol (VITAMIN D) 1000 units tablet Take 1,000 Units by mouth daily.   Yes [provider]  diazepam (VALIUM) 2 MG tablet Take 2 mg by mouth 4 (four) times daily.   Yes [provider]  doxylamine, Sleep, (SLEEP AID) 25 MG tablet Take 25 mg by mouth at bedtime as needed for sleep.    Yes [provider]  vitamin E 100 UNIT capsule Take 100 Units by mouth daily.   Yes [provider]  doxycycline (VIBRAMYCIN) 100 MG capsule Take 1 capsule (100 mg  total) by mouth 2 (two) times daily. 03/28/18   Antony MaduraHumes, Erynn Vaca, PA-C  hydrOXYzine (ATARAX/VISTARIL) 25 MG tablet Take 1 tablet (25 mg total) by mouth 3 (three) times daily as needed for anxiety. Patient not taking: Reported on 03/28/2018 07/26/17   Money, Gerlene Burdockravis B, FNP  lidocaine (LIDODERM) 5 % Place 1 patch onto the skin daily. Remove & Discard patch within 12 hours or as directed by MD Patient not taking: Reported on 03/28/2018 07/27/17   Money, Gerlene Burdockravis B, FNP  metroNIDAZOLE (FLAGYL) 500 MG tablet Take 1 tablet (500 mg total) by mouth 2 (two) times daily. 03/28/18   Antony MaduraHumes, Marcene Laskowski, PA-C  PRESCRIPTION MEDICATION Take 1 tablet by mouth daily.    [provider]  risperiDONE (RISPERDAL) 0.5 MG tablet Take 1 tablet (0.5 mg total) by mouth daily. For mood  control Patient not taking: Reported on 03/28/2018 07/27/17   Money, Gerlene Burdockravis B, FNP  risperiDONE (RISPERDAL) 1 MG tablet Take 1 tablet (1 mg total) by mouth at bedtime. For mood control Patient not taking: Reported on 03/28/2018 07/26/17   Money, Gerlene Burdockravis B, FNP  traZODone (DESYREL) 50 MG tablet Take 1 tablet (50 mg total) by mouth at bedtime as needed for sleep. Patient not taking: Reported on 03/28/2018 07/26/17   Money, Gerlene Burdockravis B, FNP    Family History Family History  Problem Relation Age of Onset  . Stroke Mother   . Hyperlipidemia Other     Social History Social History   Tobacco Use  . Smoking status: Current Every Day Smoker    Packs/day: 0.50    Years: 13.00    Pack years: 6.50    Types: Cigarettes  . Smokeless tobacco: Never Used  Substance Use Topics  . Alcohol use: Yes    Comment: Drinks once a month  . Drug use: Yes    Types: Marijuana    Comment: daily     Allergies   Bee venom; Coconut flavor; Ibuprofen; and Naproxen   Review of Systems Review of Systems  Gastrointestinal: Positive for abdominal pain.   Ten systems reviewed and are negative for acute change, except as noted in the HPI.    Physical Exam Updated Vital Signs BP 113/73   Pulse 71   Temp 99.3 F (37.4 C) (Oral)   Resp 14   Ht 5\' 3"  (1.6 m)   SpO2 100%   BMI 34.72 kg/m   Physical Exam  Constitutional: She is oriented to person, place, and time. She appears well-developed and well-nourished. No distress.  Nontoxic appearing and in NAD  HENT:  Head: Normocephalic and atraumatic.  Eyes: Conjunctivae and EOM are normal. No scleral icterus.  Neck: Normal range of motion.  Cardiovascular: Normal rate, regular rhythm and intact distal pulses.  Pulmonary/Chest: Effort normal. No respiratory distress.  Respirations even and unlabored  Abdominal: Soft. She exhibits no mass. There is tenderness. There is no guarding.  Soft, nondistended abdomen with TTP in the LLQ, mildly also in suprapubic  abdomen. No peritoneal signs or palpable masses.  Musculoskeletal: Normal range of motion.  Neurological: She is alert and oriented to person, place, and time. She exhibits normal muscle tone. Coordination normal.  Skin: Skin is warm and dry. No rash noted. She is not diaphoretic. No erythema. No pallor.  Psychiatric: She has a normal mood and affect. Her behavior is normal.  Nursing note and vitals reviewed.    ED Treatments / Results  Labs (all labs ordered are listed, but only abnormal results are displayed) Labs Reviewed  WET PREP, GENITAL - Abnormal; Notable for the following components:      Result Value   Trich, Wet Prep PRESENT (*)    Clue Cells Wet Prep HPF POC PRESENT (*)    WBC, Wet Prep HPF POC MODERATE (*)    All other components within normal limits  COMPREHENSIVE METABOLIC PANEL - Abnormal; Notable for the following components:   Potassium 3.3 (*)    Glucose, Bld 107 (*)    All other components within normal limits  URINALYSIS, ROUTINE W REFLEX MICROSCOPIC - Abnormal; Notable for the following components:   Color, Urine AMBER (*)    APPearance HAZY (*)    Specific Gravity, Urine 1.033 (*)    Bilirubin Urine SMALL (*)    Ketones, ur 5 (*)    Protein, ur 30 (*)    Leukocytes, UA TRACE (*)    All other components within normal limits  LIPASE, BLOOD  CBC  I-STAT BETA HCG BLOOD, ED ([facility], [facility], [facility] ONLY)    EKG None  Radiology US Pelvic Complete With Transvaginal  Result Date: 03/28/2018 CLINICAL DATA:  Left-sided pelvic pain for 2 days. Prior right oophorectomy. EXAM: TRANSABDOMINAL AND TRANSVAGINAL ULTRASOUND OF PELVIS TECHNIQUE: Both transabdominal and transvaginal ultrasound examinations of the pelvis were performed. Transabdominal technique was performed for global imaging of the pelvis including uterus, ovaries, adnexal regions, and pelvic cul-de-sac. It was necessary to proceed with endovaginal exam following the transabdominal exam to visualize the left ovary,  uterus and endometrium. COMPARISON:  CT 05/03/2017.  Pelvic ultrasound 11/01/2015 FINDINGS: Uterus Measurements: 8.3 x 3.4 x 4.5 cm. 1.3 cm hypoechoic fundal fibroid posteriorly. Endometrium Thickness: 5 mm, normal.  No focal abnormality visualized. Right ovary Surgically absent.  No adnexal mass. Left ovary Measurements: 2.1 x 3.1 x 2.4 cm. Normal appearance with physiologic follicles. Blood flow is visualized. No adnexal mass. Other findings No abnormal free fluid. IMPRESSION: 1. Small posterior fundal uterine fibroid. 2. Normal sonographic appearance of the left ovary. Post right oophorectomy. Electronically Signed   By: Rubye Oaks M.D.   On: 03/28/2018 02:59    Procedures Procedures (including critical care time)  Medications Ordered in ED Medications  ketorolac (TORADOL) 60 MG/2ML injection (has no administration in time range)  lidocaine (PF) (XYLOCAINE) 1 % injection (has no administration in time range)  ketorolac (TORADOL) injection 60 mg (60 mg Intramuscular Given 03/28/18 0223)  cefTRIAXone (ROCEPHIN) injection 250 mg (250 mg Intramuscular Given 03/28/18 0425)     Initial Impression / Assessment and Plan / ED Course  I have reviewed the triage vital signs and the nursing notes.  Pertinent labs & imaging results that were available during my care of the patient were reviewed by me and considered in my medical decision making (see chart for details).     Patient presents for left lower quadrant abdominal pain.  She has concern for pelvic inflammatory disease as she tested positive for trichomonas on her wet prep.  The patient is afebrile with reassuring vital signs.  She has no leukocytosis or electrolyte derangements.  No evidence of urinary tract infection.  Kidney and liver function preserved.  Pelvic ultrasound obtained which shows no evidence of TOA or torsion.  Small fibroid noted to the uterus, though this less likely the cause of the patient's discomfort.  She has had some  improvement in her pain following Toradol.  She was given Rocephin prior to discharge with outpatient prescription for doxycycline and Flagyl.  Patient advised to have her partner tested  and treated for STDs.  OB/GYN follow-up encouraged.  Return precautions discussed and provided. Patient discharged in stable condition with no unaddressed concerns.   Final Clinical Impressions(s) / ED Diagnoses   Final diagnoses:  Pelvic pain in female  PID (acute pelvic inflammatory disease)  Trichomonal vaginitis    ED Discharge Orders        Ordered    doxycycline (VIBRAMYCIN) 100 MG capsule  2 times daily     03/28/18 0419    metroNIDAZOLE (FLAGYL) 500 MG tablet  2 times daily     03/28/18 0419       Antony Madura, PA-C 03/28/18 1610    Wilkie Aye Mayer Masker, MD 03/28/18 [phone]

## 2018-10-02 DIAGNOSIS — J4540 Moderate persistent asthma, uncomplicated: Secondary | ICD-10-CM | POA: Insufficient documentation

## 2018-10-02 DIAGNOSIS — Z6834 Body mass index (BMI) 34.0-34.9, adult: Secondary | ICD-10-CM | POA: Insufficient documentation

## 2018-10-05 ENCOUNTER — Other Ambulatory Visit: Payer: Self-pay | Admitting: Physician Assistant

## 2018-10-05 DIAGNOSIS — N6320 Unspecified lump in the left breast, unspecified quadrant: Secondary | ICD-10-CM

## 2018-10-12 ENCOUNTER — Ambulatory Visit
Admission: RE | Admit: 2018-10-12 | Discharge: 2018-10-12 | Disposition: A | Discharge status: Home / Self Care | Payer: Medicaid Other | Source: Ambulatory Visit | Attending: Physician Assistant | Admitting: Physician Assistant

## 2018-10-12 DIAGNOSIS — N6320 Unspecified lump in the left breast, unspecified quadrant: Secondary | ICD-10-CM

## 2018-11-07 ENCOUNTER — Ambulatory Visit: Payer: Medicaid Other | Attending: Physician Assistant | Admitting: Physical Therapy

## 2018-11-07 ENCOUNTER — Other Ambulatory Visit: Payer: Self-pay

## 2018-11-07 DIAGNOSIS — G8929 Other chronic pain: Secondary | ICD-10-CM | POA: Insufficient documentation

## 2018-11-07 DIAGNOSIS — M5441 Lumbago with sciatica, right side: Secondary | ICD-10-CM | POA: Insufficient documentation

## 2018-11-07 DIAGNOSIS — M6281 Muscle weakness (generalized): Secondary | ICD-10-CM | POA: Diagnosis present
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
## 2018-11-07 NOTE — Patient Instructions (Signed)
Access Code: [email or web address]  URL: [email or web address]  Date: 11/07/2018  Prepared by: Dwana Curd   Exercises  Supine Lower Trunk Rotation - 10 reps - 3 sets - 1x daily - 7x weekly  Hooklying Single Knee to Chest Stretch - 5 reps - 1 sets - 5 sec hold - 1x daily - 7x weekly

## 2018-11-13 ENCOUNTER — Other Ambulatory Visit: Payer: Self-pay

## 2018-11-13 ENCOUNTER — Encounter: Payer: Self-pay | Admitting: Physical Therapy

## 2018-11-13 ENCOUNTER — Ambulatory Visit: Payer: Medicaid Other | Admitting: Physical Therapy

## 2018-11-13 DIAGNOSIS — G8929 Other chronic pain: Secondary | ICD-10-CM

## 2018-11-13 DIAGNOSIS — M6281 Muscle weakness (generalized): Secondary | ICD-10-CM

## 2018-11-13 DIAGNOSIS — M5441 Lumbago with sciatica, right side: Secondary | ICD-10-CM | POA: Diagnosis not present

## 2018-11-13 NOTE — Therapy (Addendum)
[facility] [address], [place], [place], [address] Phone: [phone]   Fax:  [phone]  Physical Therapy Treatment  Patient Details  Name: [name] MRN: [id number] Date of Birth: 12-07-1985 Referring Provider (PT): Harrison Mons, [place]   Encounter Date: 11/13/2018  PT End of Session - 11/13/18 1233    Visit Number  2    Date for PT Re-Evaluation  01/30/19    PT Start Time  1233    PT Stop Time  1312    PT Time Calculation (min)  39 min    Activity Tolerance  Patient tolerated treatment well    Behavior During Therapy  [place] for tasks assessed/performed       Past Medical History:  Diagnosis Date  . Anxiety   . Asthma   . Bipolar 1 disorder ([place])   . Depression   . Endometriosis   . Mental disorder     Past Surgical History:  Procedure Laterality Date  . ABDOMINAL SURGERY     "For endometriosis"  . CHOLECYSTECTOMY    . OOPHORECTOMY Right 2012  . TUBAL LIGATION      There were no vitals filed for this visit.  Subjective Assessment - 11/13/18 1238    Subjective  I am not sleeping well, I just got a new kitten. My back is hurting today.     How long can you sit comfortably?  30 minutes    How long can you walk comfortably?  my house to [place] maybe 1 mile total    Currently in Pain?  Yes    Pain Score  7     Pain Orientation  --   Rt > LT   Pain Descriptors / Indicators  Aching    Aggravating Factors   anything for too long    Pain Relieving Factors  nothing    Multiple Pain Sites  No                       [facility] Adult PT Treatment/Exercise - 11/13/18 0001      Self-Care   Self-Care  Other Self-Care Comments    Other Self-Care Comments   Pain science education       Lumbar Exercises: Stretches   Active Hamstring Stretch  Right;Left;3 reps;20 seconds    Single Knee to Chest Stretch  Right;Left;3 reps;20 seconds    Lower Trunk Rotation  --   Rock 10x each side,  then 2x 20 sec with breathing     Lumbar Exercises: Aerobic   Nustep  L1 x 6 min slow      Lumbar Exercises: Supine   Ab Set  --   Ab, ball, and glute cocontraction 6x 3 sec hold   Bent Knee Raise  --   5x, VC to stay connected to her center   Other Supine Lumbar Exercises  Diaphragmatic breathing 1 min                  PT Long Term Goals - 11/07/18 1740      PT LONG TERM GOAL #1   Title  Pt will be ind with advanced HEP    Time  12    Period  Weeks    Status  New    Target Date  01/30/19      PT LONG TERM GOAL #2   Title  Pt will be able to touch the floor due to increased lumbar flexion  for improved ability to perform house chores.    Baseline  38 cm away from the floor    Time  12    Period  Weeks    Status  New    Target Date  01/30/19      PT LONG TERM GOAL #3   Title  Pt will report she is able to walk to and from [facility] with < or = to 6/10 pain    Baseline  10/10    Time  12    Period  Weeks    Status  New    Target Date  01/30/19      PT LONG TERM GOAL #4   Title  pt will report no pain radiating down into Rt leg during typical daily activities    Time  12    Period  Weeks    Status  New    Target Date  01/30/19            Plan - 11/13/18 1234    Clinical Impression Statement  Pt arrives today with less pain inher low back than what was reported on evaluation. She demonstrates compliance with initial HEP, additional exercises given today for HEP progression including pain science education.  She ended session with no pain and a feeling of hope.    Personal Factors and Comorbidities  Past/Current Experience;Behavior Pattern;Comorbidity 1;Time since onset of injury/illness/exacerbation;Fitness    Examination-Activity Limitations  Stand;Lift;Bend    Examination-Participation Restrictions  Meal Prep;Cleaning;Community Activity    Stability/Clinical Decision Making  Unstable/Unpredictable    Rehab Potential  Good    PT Frequency  2x / week     PT Duration  12 weeks    PT Treatment/Interventions  ADLs/Self Care Home Management;Cryotherapy;Artist;Therapeutic activities;Therapeutic exercise;Balance training;Neuromuscular re-education;Patient/family education;Manual techniques;Scar mobilization;Passive range of motion;Spinal Manipulations;Taping;Dry needling    PT Next Visit Plan  Review HEP given today. Progress core stabilization ( very beginner) Nustep for aerobic exercise.     PT Home Exercise Plan   Access Code: [email or web address]     Consulted and Agree with Plan of Care  Patient       Patient will benefit from skilled therapeutic intervention in order to improve the following deficits and impairments:  Increased fascial restricitons, Postural dysfunction, Decreased strength, Impaired flexibility, Pain, Decreased range of motion  Visit Diagnosis: Chronic bilateral low back pain with right-sided sciatica  Muscle weakness (generalized)     Problem List Patient Active Problem List   Diagnosis Date Noted  . Severe recurrent major depression with psychotic features ([place]) 07/23/2017  . Substance use disorder 07/23/2017  . Pelvic pain in female 01/15/2015  . MDD (major depressive disorder), recurrent episode, severe ([place]) 03/11/2014  . MDD (major depressive disorder) 03/11/2014  . Bipolar disorder with depression ([place]) 09/14/2013  . Major depressive disorder, recurrent episode, severe ([place]) 12/30/2011    Class: Stage 3  . Bipolar affective disorder, depressed, severe ([place]) 12/30/2011    Class: Acute    ,, PTA 11/13/2018, 1:18 PM  [facility] [address] [place], [place], [address] Phone: [phone]   Fax:  [phone]  Name: [name] MRN: [id number] Date of Birth: October 06, 1985  Access Code: [facility]  URL: [email or web address]  Date: 11/13/2018  Prepared by: Myrene Galas   Exercises  Supine Lower Trunk Rotation - 10 reps - 3 sets - 1x daily - 7x weekly  Hooklying Single Knee to Chest Stretch - 5 reps -  1 sets - 5 sec hold - 1x daily - 7x weekly  Supine Transversus Abdominis Bracing - Hands on Ground - 10 reps - 1 sets - 3 hold - 3x daily - 7x weekly  Supine Diaphragmatic Breathing - 10 reps - 1 sets - 3x daily - 7x weekly  Supine March - 10 reps - 2 sets - 2x daily - 7x weekly  PHYSICAL THERAPY DISCHARGE SUMMARY  Visits from Start of Care: 2  Current functional level related to goals / functional outcomes:   see above  Remaining deficits: See above   Education / Equipment: HEP  Plan: Patient agrees to discharge.  Patient goals were not met. Patient is being discharged due to not returning since the last visit.  ?????    [employer], PT 01/06/19 2:08 PM

## 2018-11-20 ENCOUNTER — Ambulatory Visit: Payer: Medicaid Other | Admitting: Physical Therapy

## 2018-11-27 ENCOUNTER — Ambulatory Visit: Payer: Medicaid Other | Admitting: Physical Therapy

## 2018-12-30 ENCOUNTER — Telehealth: Payer: Self-pay | Admitting: Physical Therapy

## 2018-12-30 NOTE — Telephone Encounter (Signed)
PTA called pt: Lm to call our office Monday to schedule PT visits if she would like.   Ane Payment PTA  12/30/18 10:38 AM

## 2019-02-17 ENCOUNTER — Other Ambulatory Visit: Payer: Self-pay

## 2019-02-17 ENCOUNTER — Emergency Department (HOSPITAL_COMMUNITY): Payer: Medicaid Other

## 2019-02-17 ENCOUNTER — Emergency Department (HOSPITAL_COMMUNITY)
Admission: EM | Admit: 2019-02-17 | Discharge: 2019-02-17 | Disposition: A | Discharge status: Home / Self Care | Payer: Medicaid Other | Attending: Emergency Medicine | Admitting: Emergency Medicine

## 2019-02-17 ENCOUNTER — Encounter (HOSPITAL_COMMUNITY): Payer: Self-pay

## 2019-02-17 DIAGNOSIS — F17210 Nicotine dependence, cigarettes, uncomplicated: Secondary | ICD-10-CM | POA: Diagnosis not present

## 2019-02-17 DIAGNOSIS — Z79899 Other long term (current) drug therapy: Secondary | ICD-10-CM | POA: Insufficient documentation

## 2019-02-17 DIAGNOSIS — J45909 Unspecified asthma, uncomplicated: Secondary | ICD-10-CM | POA: Diagnosis not present

## 2019-02-17 DIAGNOSIS — K5289 Other specified noninfective gastroenteritis and colitis: Secondary | ICD-10-CM | POA: Insufficient documentation

## 2019-02-17 DIAGNOSIS — R1011 Right upper quadrant pain: Secondary | ICD-10-CM | POA: Insufficient documentation

## 2019-02-17 DIAGNOSIS — R109 Unspecified abdominal pain: Secondary | ICD-10-CM

## 2019-02-17 DIAGNOSIS — K529 Noninfective gastroenteritis and colitis, unspecified: Secondary | ICD-10-CM

## 2019-02-17 LAB — CBC WITH DIFFERENTIAL/PLATELET
Abs Immature Granulocytes: 0.01 10*3/uL (ref 0.00–0.07)
Basophils Absolute: 0.1 10*3/uL (ref 0.0–0.1)
Basophils Relative: 1 %
Eosinophils Absolute: 0.1 10*3/uL (ref 0.0–0.5)
Eosinophils Relative: 1 %
HCT: 45.9 % (ref 36.0–46.0)
Hemoglobin: 15.8 g/dL — ABNORMAL HIGH (ref 12.0–15.0)
Immature Granulocytes: 0 %
Lymphocytes Relative: 35 %
Lymphs Abs: 1.9 10*3/uL (ref 0.7–4.0)
MCH: 33.5 pg (ref 26.0–34.0)
MCHC: 34.4 g/dL (ref 30.0–36.0)
MCV: 97.5 fL (ref 80.0–100.0)
Monocytes Absolute: 0.4 10*3/uL (ref 0.1–1.0)
Monocytes Relative: 8 %
Neutro Abs: 2.9 10*3/uL (ref 1.7–7.7)
Neutrophils Relative %: 55 %
Platelets: 193 10*3/uL (ref 150–400)
RBC: 4.71 MIL/uL (ref 3.87–5.11)
RDW: 12.6 % (ref 11.5–15.5)
WBC: 5.2 10*3/uL (ref 4.0–10.5)
nRBC: 0 % (ref 0.0–0.2)

## 2019-02-17 LAB — URINALYSIS, ROUTINE W REFLEX MICROSCOPIC
Bilirubin Urine: NEGATIVE
Glucose, UA: NEGATIVE mg/dL
Hgb urine dipstick: NEGATIVE
Ketones, ur: NEGATIVE mg/dL
Nitrite: NEGATIVE
Protein, ur: NEGATIVE mg/dL
Specific Gravity, Urine: 1.018 (ref 1.005–1.030)
pH: 5 (ref 5.0–8.0)

## 2019-02-17 LAB — COMPREHENSIVE METABOLIC PANEL
ALT: 22 U/L (ref 0–44)
AST: 17 U/L (ref 15–41)
Albumin: 4.5 g/dL (ref 3.5–5.0)
Alkaline Phosphatase: 66 U/L (ref 38–126)
Anion gap: 9 (ref 5–15)
BUN: 8 mg/dL (ref 6–20)
CO2: 24 mmol/L (ref 22–32)
Calcium: 9.2 mg/dL (ref 8.9–10.3)
Chloride: 105 mmol/L (ref 98–111)
Creatinine, Ser: 0.83 mg/dL (ref 0.44–1.00)
GFR calc Af Amer: >60 mL/min (ref >60)
GFR calc non Af Amer: >60 mL/min (ref >60)
Glucose, Bld: 102 mg/dL — ABNORMAL HIGH (ref 70–99)
Potassium: 3.5 mmol/L (ref 3.5–5.1)
Sodium: 138 mmol/L (ref 135–145)
Total Bilirubin: 0.6 mg/dL (ref 0.3–1.2)
Total Protein: 7.6 g/dL (ref 6.5–8.1)

## 2019-02-17 LAB — I-STAT BETA HCG BLOOD, ED (MC, WL, AP ONLY): I-stat hCG, quantitative: <5 m[IU]/mL (ref <5)

## 2019-02-17 LAB — LIPASE, BLOOD: Lipase: 24 U/L (ref 11–51)

## 2019-02-17 MED ORDER — SODIUM CHLORIDE 0.9 % IV BOLUS
500.0000 mL | Freq: Once | INTRAVENOUS | Status: AC
  Administered 2019-02-17: 500 mL via INTRAVENOUS

## 2019-02-17 MED ORDER — CIPROFLOXACIN HCL 500 MG PO TABS: 500.0000 mg | ORAL_TABLET | Freq: Two times a day (BID) | ORAL | 0 refills | Status: DC

## 2019-02-17 MED ORDER — IOHEXOL 300 MG/ML  SOLN
100.0000 mL | Freq: Once | INTRAMUSCULAR | Status: AC | PRN
  Administered 2019-02-17: 100 mL via INTRAVENOUS

## 2019-02-17 MED ORDER — SODIUM CHLORIDE (PF) 0.9 % IJ SOLN
INTRAMUSCULAR | Status: AC
  Filled 2019-02-17: qty 50

## 2019-02-17 MED ORDER — ONDANSETRON 4 MG PO TBDP: 4.0000 mg | ORAL_TABLET | Freq: Three times a day (TID) | ORAL | 0 refills | Status: DC | PRN

## 2019-02-17 MED ORDER — METRONIDAZOLE 500 MG PO TABS: 500.0000 mg | ORAL_TABLET | Freq: Three times a day (TID) | ORAL | 0 refills | Status: DC

## 2019-02-17 MED ORDER — ONDANSETRON HCL 4 MG/2ML IJ SOLN
4.0000 mg | Freq: Once | INTRAMUSCULAR | Status: AC
  Administered 2019-02-17: 4 mg via INTRAVENOUS
  Filled 2019-02-17: qty 2

## 2019-02-17 MED ORDER — OXYCODONE-ACETAMINOPHEN 5-325 MG PO TABS: 1.0000 | ORAL_TABLET | Freq: Four times a day (QID) | ORAL | 0 refills | Status: DC | PRN

## 2019-02-17 MED ORDER — MORPHINE SULFATE (PF) 4 MG/ML IV SOLN
4.0000 mg | Freq: Once | INTRAVENOUS | Status: AC
  Administered 2019-02-17: 4 mg via INTRAVENOUS
  Filled 2019-02-17: qty 1

## 2019-02-17 MED ORDER — HYDROMORPHONE HCL 1 MG/ML IJ SOLN
1.0000 mg | Freq: Once | INTRAMUSCULAR | Status: AC
  Administered 2019-02-17: 1 mg via INTRAVENOUS
  Filled 2019-02-17: qty 1

## 2019-02-17 MED ORDER — HYDROMORPHONE HCL 1 MG/ML IJ SOLN
0.5000 mg | Freq: Once | INTRAMUSCULAR | Status: AC
  Administered 2019-02-17: 0.5 mg via INTRAVENOUS
  Filled 2019-02-17: qty 1

## 2019-02-17 NOTE — ED Notes (Signed)
Pt made aware of need for urine specimen. Pt will attempt to provide one after receiving come fluids.

## 2019-02-17 NOTE — ED Triage Notes (Signed)
She c/o right upper quadrant area abd. Pain x 4 days. She tells me "It feels just like it did when I had my gallbladder out 6 years ago" [sic]. She is ambulatory and in no distress.

## 2019-02-17 NOTE — Discharge Instructions (Signed)
You were seen in the ER today for abdominal pain. Your labs did not show significant abnormalities.  Your CT scan showed findings suspicion for colitis.  We are treating the colitis with antibiotics: ciprofloxacin & flagyl. Do not consume alcohol while taking flagyl as it is extremely dangerous. We are also sending you home with zofran to take every 8 hours as needed for nausea/vomiting & percocet to take every 6 hours as needed for severe pain.   -Percocet-this is a narcotic/controlled substance medication that has potential addicting qualities.  We recommend that you take 1-2 tablets every 6 hours as needed for severe pain.  Do not drive or operate heavy machinery when taking this medicine as it can be sedating. Do not drink alcohol or take other sedating medications when taking this medicine for safety reasons.  Keep this out of reach of small children.  Please be aware this medicine has Tylenol in it (325 mg/tab) do not exceed the maximum dose of Tylenol in a day per over the counter recommendations should you decide to supplement with Tylenol over the counter.    We have prescribed you new medication(s) today. Discuss the medications prescribed today with your pharmacist as they can have adverse effects and interactions with your other medicines including over the counter and prescribed medications. Seek medical evaluation if you start to experience new or abnormal symptoms after taking one of these medicines, seek care immediately if you start to experience difficulty breathing, feeling of your throat closing, facial swelling, or rash as these could be indications of a more serious allergic reaction  Follow up with primary care within 3-5 days. Return to the ER for new or worsening symptoms including but not limited to fever, increased pain, inability to keep fluids down, blood in your stool or any other concerns.

## 2019-02-17 NOTE — ED Provider Notes (Signed)
[facility]-EMERGENCY DEPT Provider Note   CSN: [id number] Arrival date & time: 02/17/19  1037     History   Chief Complaint Chief Complaint  Patient presents with  . Abdominal Pain    HPI [name] is a 33 y.o. female w/ a hx of tobacco abuse, bipolar 1 disorder, anxiety, asthma, endometriosis, & prior cholecystectomy, tubal ligation, & R oophorectomy who presents to the ED w/ complaints of abdominal pain x 4 days. Patient reports non radiating pain to the RUQ of the abdomen which is aching in nature. Notes pain is a 10/10 in severity, worse with movement, deep breath, & palpation, no alleviating factors. Tried anti-acids without relief. Feels similar to when she needed her gallbladder removed several years ago. Has had a few loose stools, non bloody. Denies fever, chills, nausea, vomiting, melena, hematochezia, dysuria, vaginal bleeding/discharge, chest pain, or dyspnea. Denies leg pain/swelling, hemoptysis, recent surgery/trauma, recent long travel, hormone use, personal hx of cancer, or hx of DVT/PE.   HPI  Past Medical History:  Diagnosis Date  . Anxiety   . Asthma   . Bipolar 1 disorder ([facility])   . Depression   . Endometriosis   . Mental disorder     Patient Active Problem List   Diagnosis Date Noted  . Severe recurrent major depression with psychotic features ([facility]) 07/23/2017  . Substance use disorder 07/23/2017  . Pelvic pain in female 01/15/2015  . MDD (major depressive disorder), recurrent episode, severe ([facility]) 03/11/2014  . MDD (major depressive disorder) 03/11/2014  . Bipolar disorder with depression ([facility]) 09/14/2013  . Major depressive disorder, recurrent episode, severe ([facility]) 12/30/2011    Class: Stage 3  . Bipolar affective disorder, depressed, severe ([facility]) 12/30/2011    Class: Acute    Past Surgical History:  Procedure Laterality Date  . ABDOMINAL SURGERY     "For endometriosis"  . CHOLECYSTECTOMY    . OOPHORECTOMY Right 2012   . TUBAL LIGATION       OB History    Gravida  4   Para  2   Term  2   Preterm  0   AB  2   Living  3     SAB  2   TAB  0   Ectopic  0   Multiple  0   Live Births  1            Home Medications    Prior to Admission medications   Medication Sig Start Date End Date Taking? Authorizing Provider  albuterol (PROVENTIL HFA;VENTOLIN HFA) 108 (90 BASE) MCG/ACT inhaler Inhale 1-2 puffs into the lungs every 6 (six) hours as needed for wheezing or shortness of breath.    [provider]  cariprazine (VRAYLAR) capsule Take 3 mg by mouth daily.    [provider]  cholecalciferol (VITAMIN D) 1000 units tablet Take 1,000 Units by mouth daily.    [provider]  diazepam (VALIUM) 2 MG tablet Take 2 mg by mouth 4 (four) times daily.    [provider]  doxycycline (VIBRAMYCIN) 100 MG capsule Take 1 capsule (100 mg total) by mouth 2 (two) times daily. 03/28/18   Antony MaduraHumes, Kelly, PA-C  doxylamine, Sleep, (SLEEP AID) 25 MG tablet Take 25 mg by mouth at bedtime as needed for sleep.     [provider]  hydrOXYzine (ATARAX/VISTARIL) 25 MG tablet Take 1 tablet (25 mg total) by mouth 3 (three) times daily as needed for anxiety. Patient not taking:  Reported on 03/28/2018 07/26/17   Money, Gerlene Burdockravis B, FNP  lidocaine (LIDODERM) 5 % Place 1 patch onto the skin daily. Remove & Discard patch within 12 hours or as directed by MD Patient not taking: Reported on 03/28/2018 07/27/17   Money, Gerlene Burdockravis B, FNP  metroNIDAZOLE (FLAGYL) 500 MG tablet Take 1 tablet (500 mg total) by mouth 2 (two) times daily. 03/28/18   Antony MaduraHumes, Kelly, PA-C  PRESCRIPTION MEDICATION Take 1 tablet by mouth daily.    [provider]  risperiDONE (RISPERDAL) 0.5 MG tablet Take 1 tablet (0.5 mg total) by mouth daily. For mood control Patient not taking: Reported on 03/28/2018 07/27/17   Money, Gerlene Burdockravis B, FNP  risperiDONE (RISPERDAL) 1 MG tablet Take 1 tablet (1 mg total) by mouth at  bedtime. For mood control Patient not taking: Reported on 03/28/2018 07/26/17   Money, Gerlene Burdockravis B, FNP  traZODone (DESYREL) 50 MG tablet Take 1 tablet (50 mg total) by mouth at bedtime as needed for sleep. Patient not taking: Reported on 03/28/2018 07/26/17   Money, Gerlene Burdockravis B, FNP  vitamin E 100 UNIT capsule Take 100 Units by mouth daily.    [provider]    Family History Family History  Problem Relation Age of Onset  . Stroke Mother   . Hyperlipidemia Other     Social History Social History   Tobacco Use  . Smoking status: Current Every Day Smoker    Packs/day: 0.50    Years: 13.00    Pack years: 6.50    Types: Cigarettes  . Smokeless tobacco: Never Used  Substance Use Topics  . Alcohol use: Yes    Comment: Drinks once a month  . Drug use: Yes    Types: Marijuana    Comment: daily     Allergies   Bee venom, Coconut flavor, Ibuprofen, and Naproxen   Review of Systems Review of Systems  Constitutional: Negative for chills and fever.  Respiratory: Negative for cough and shortness of breath.   Cardiovascular: Negative for chest pain.  Gastrointestinal: Positive for abdominal pain and diarrhea. Negative for blood in stool, constipation, nausea and vomiting.  Genitourinary: Negative for dysuria, frequency, urgency, vaginal bleeding and vaginal discharge.  All other systems reviewed and are negative.    Physical Exam Updated Vital Signs BP 132/88 (BP Location: Left Arm)   Pulse 78   Temp 98.7 F (37.1 C) (Oral)   Resp 18   LMP 01/28/2019 (Exact Date)   SpO2 99%   Physical Exam Vitals signs and nursing note reviewed.  Constitutional:      General: She is not in acute distress.    Appearance: She is well-developed. She is not toxic-appearing.  HENT:     Head: Normocephalic and atraumatic.  Eyes:     General:        Right eye: No discharge.        Left eye: No discharge.     Conjunctiva/sclera: Conjunctivae normal.  Neck:     Musculoskeletal: Neck  supple.  Cardiovascular:     Rate and Rhythm: Normal rate and regular rhythm.  Pulmonary:     Effort: Pulmonary effort is normal. No respiratory distress.     Breath sounds: Normal breath sounds. No wheezing, rhonchi or rales.  Abdominal:     General: There is no distension.     Palpations: Abdomen is soft.     Tenderness: There is abdominal tenderness in the right upper quadrant. There is no right CVA tenderness, left CVA tenderness, guarding  or rebound.  Skin:    General: Skin is warm and dry.     Findings: No rash.  Neurological:     Mental Status: She is alert.     Comments: Clear speech.   Psychiatric:        Behavior: Behavior normal.    ED Treatments / Results  Labs (all labs ordered are listed, but only abnormal results are displayed) Labs Reviewed  CBC WITH DIFFERENTIAL/PLATELET - Abnormal; Notable for the following components:      Result Value   Hemoglobin 15.8 (*)    All other components within normal limits  COMPREHENSIVE METABOLIC PANEL - Abnormal; Notable for the following components:   Glucose, Bld 102 (*)    All other components within normal limits  URINALYSIS, ROUTINE W REFLEX MICROSCOPIC - Abnormal; Notable for the following components:   APPearance HAZY (*)    Leukocytes,Ua TRACE (*)    Bacteria, UA RARE (*)    All other components within normal limits  LIPASE, BLOOD  I-STAT BETA HCG BLOOD, ED ([facility], [facility], [facility] ONLY)    EKG    Radiology Ct Abdomen Pelvis W Contrast  Result Date: 02/17/2019 CLINICAL DATA:  Right upper quadrant abdominal pain for the past 4 days. EXAM: CT ABDOMEN AND PELVIS WITH CONTRAST TECHNIQUE: Multidetector CT imaging of the abdomen and pelvis was performed using the standard protocol following bolus administration of intravenous contrast. CONTRAST:  100mL OMNIPAQUE IOHEXOL 300 MG/ML  SOLN COMPARISON:  CT abdomen pelvis-05/03/2017; 04/29/2015 FINDINGS: Lower chest: Limited visualization the lower thorax demonstrates minimal dependent  subpleural ground-glass atelectasis, right greater than left. No pleural effusion. Normal heart size.  No pericardial effusion. Hepatobiliary: Normal hepatic contour. No discrete hepatic lesions. Post cholecystectomy. No intra or extrahepatic biliary ductal dilatation. No ascites. Pancreas: Normal appearance of the pancreas. Spleen: Punctate granuloma within otherwise, unchanged. Note normal-appearing spleen is again made of a small splenule. Adrenals/Urinary Tract: There is symmetric enhancement of the bilateral kidneys. No definite renal stones on this postcontrast examination. No discrete renal lesions. Note is again made of a small right-sided extrarenal pelvis. No urinary obstruction or perinephric stranding. Normal appearance of the bilateral adrenal glands. Normal appearance of the urinary bladder given underdistention. Stomach/Bowel: While potentially accentuated due to underdistention, there is apparent diffuse wall thickening involving the colon (representative coronal images 67, 34 and 69, series 4) as could be seen in the setting of colitis. No evidence of enteric obstruction. Normal appearance of the terminal ileum and the appendix. Moderate-sized hiatal hernia. No pneumoperitoneum, pneumatosis or portal venous gas. Vascular/Lymphatic: Normal caliber the abdominal aorta. The major branch vessels of the abdominal aorta appear patent on this non CTA examination. A punctate phleboliths is noted within the right gonadal vein (image 64, series 4). No bulky retroperitoneal, mesenteric, pelvic or inguinal lymphadenopathy. Reproductive: Note is made of an approximately 1.5 x 1.4 cm left-sided adnexal cyst, presumably physiologic. No discrete right-sided adnexal lesion. No free fluid within the pelvic cul-de-sac. Other: Regional soft tissues appear normal. Musculoskeletal: No acute or aggressive osseous abnormalities. IMPRESSION: 1. Mild apparent diffuse circumferential colonic wall thickening, potentially  accentuated due to underdistention though could be seen in the setting of colitis. 2. Otherwise, no explanation for patient's severe right upper quadrant abdominal pains. Specifically, no evidence enteric or urinary obstruction. Normal appearance of the appendix. 3. Moderate-sized hiatal hernia. 4. Post cholecystectomy. Electronically Signed   By: Simonne ComeJohn  Watts M.D.   On: 02/17/2019 14:05    Procedures Procedures (including critical care time)  Medications Ordered in ED Medications - No data to display   Initial Impression / Assessment and Plan / ED Course  I have reviewed the triage vital signs and the nursing notes.  Pertinent labs & imaging results that were available during my care of the patient were reviewed by me and considered in my medical decision making (see chart for details).    Patient presents to the ED with complaints of abdominal pain. Patient nontoxic appearing, in no apparent distress, vitals without significant abnormality. On exam patient tender to in the RUQ, no peritoneal signs. Will evaluate with labs and CT abdomen/pelvis. Analgesics, anti-emetics, and fluids administered.   ER work-up reviewed:  CBC: No leukocytosis or anemia CMP: Unremarkable. LFTs WNL. Renal function preserved.No significant electrolyte derangement.  Lipase: WNL UA: Not overly consistent w/ UTI in setting of no urinary sxs.  Preg test: Negative Imaging: CT abdomen/pelvis: Mild apparent diffuse circumferential colonic wall thickening, potentially accentuated due to underdistention though could be seen in the setting of colitis. Otherwise, no explanation for patient's severe right upper quadrant abdominal pains. Specifically, no evidence enteric or urinary obstruction. Normal appearance of the appendix. Moderate-sized hiatal hernia. Post cholecystectomy.  Repeat abdominal exam remains w/o peritoneal signs. Given her pain w/ hx of loose stools will start patient on abx and provide prescription for pain  control & anti-emetic. [place] Controlled Substance reporting System queried. Patient tolerating PO in the emergency department feeling improved. Will discharge home with supportive measures. I discussed results, treatment plan, need for PCP follow-up, and return precautions with the patient. Provided opportunity for questions, patient confirmed understanding and is in agreement with plan.    Final Clinical Impressions(s) / ED Diagnoses   Final diagnoses:  Abdominal pain, unspecified abdominal location  Colitis    ED Discharge Orders         Ordered    ciprofloxacin (CIPRO) 500 MG tablet  2 times daily     02/17/19 1447    metroNIDAZOLE (FLAGYL) 500 MG tablet  3 times daily     02/17/19 1447    ondansetron (ZOFRAN ODT) 4 MG disintegrating tablet  Every 8 hours PRN     02/17/19 1447    oxyCODONE-acetaminophen (PERCOCET/ROXICET) 5-325 MG tablet  Every 6 hours PRN     02/17/19 1447           Janayia Burggraf, Garland R, PA-C 02/17/19 1449    Virgel Manifold, MD 02/18/19 [phone]

## 2019-02-21 ENCOUNTER — Other Ambulatory Visit: Payer: Self-pay

## 2019-02-21 ENCOUNTER — Emergency Department (HOSPITAL_COMMUNITY)
Admission: EM | Admit: 2019-02-21 | Discharge: 2019-02-22 | Disposition: A | Discharge status: Other Acute Inpatient Hospital | Payer: Medicaid Other | Attending: Emergency Medicine | Admitting: Emergency Medicine

## 2019-02-21 ENCOUNTER — Encounter (HOSPITAL_COMMUNITY): Payer: Self-pay

## 2019-02-21 DIAGNOSIS — F314 Bipolar disorder, current episode depressed, severe, without psychotic features: Secondary | ICD-10-CM | POA: Insufficient documentation

## 2019-02-21 DIAGNOSIS — R45851 Suicidal ideations: Secondary | ICD-10-CM | POA: Insufficient documentation

## 2019-02-21 DIAGNOSIS — F1220 Cannabis dependence, uncomplicated: Secondary | ICD-10-CM | POA: Insufficient documentation

## 2019-02-21 DIAGNOSIS — F99 Mental disorder, not otherwise specified: Secondary | ICD-10-CM | POA: Insufficient documentation

## 2019-02-21 DIAGNOSIS — J45909 Unspecified asthma, uncomplicated: Secondary | ICD-10-CM | POA: Insufficient documentation

## 2019-02-21 DIAGNOSIS — Z79899 Other long term (current) drug therapy: Secondary | ICD-10-CM | POA: Insufficient documentation

## 2019-02-21 DIAGNOSIS — Z87891 Personal history of nicotine dependence: Secondary | ICD-10-CM | POA: Insufficient documentation

## 2019-02-21 LAB — COMPREHENSIVE METABOLIC PANEL
ALT: 17 U/L (ref 0–44)
AST: 19 U/L (ref 15–41)
Albumin: 4.4 g/dL (ref 3.5–5.0)
Alkaline Phosphatase: 57 U/L (ref 38–126)
Anion gap: 6 (ref 5–15)
BUN: 8 mg/dL (ref 6–20)
CO2: 27 mmol/L (ref 22–32)
Calcium: 9.3 mg/dL (ref 8.9–10.3)
Chloride: 105 mmol/L (ref 98–111)
Creatinine, Ser: 0.9 mg/dL (ref 0.44–1.00)
GFR calc Af Amer: >60 mL/min (ref >60)
GFR calc non Af Amer: >60 mL/min (ref >60)
Glucose, Bld: 96 mg/dL (ref 70–99)
Potassium: 3.9 mmol/L (ref 3.5–5.1)
Sodium: 138 mmol/L (ref 135–145)
Total Bilirubin: 0.6 mg/dL (ref 0.3–1.2)
Total Protein: 7.4 g/dL (ref 6.5–8.1)

## 2019-02-21 LAB — ACETAMINOPHEN LEVEL: Acetaminophen (Tylenol), Serum: <10 ug/mL — ABNORMAL LOW (ref 10–30)

## 2019-02-21 LAB — ETHANOL: Alcohol, Ethyl (B): <10 mg/dL (ref <10)

## 2019-02-21 LAB — CBC
HCT: 45.6 % (ref 36.0–46.0)
Hemoglobin: 15.5 g/dL — ABNORMAL HIGH (ref 12.0–15.0)
MCH: 33.8 pg (ref 26.0–34.0)
MCHC: 34 g/dL (ref 30.0–36.0)
MCV: 99.6 fL (ref 80.0–100.0)
Platelets: 161 K/uL (ref 150–400)
RBC: 4.58 MIL/uL (ref 3.87–5.11)
RDW: 12.7 % (ref 11.5–15.5)
WBC: 6.8 K/uL (ref 4.0–10.5)
nRBC: 0 % (ref 0.0–0.2)

## 2019-02-21 LAB — RAPID URINE DRUG SCREEN, HOSP PERFORMED
Amphetamines: NOT DETECTED
Barbiturates: POSITIVE — AB
Benzodiazepines: NOT DETECTED
Cocaine: NOT DETECTED
Opiates: NOT DETECTED

## 2019-02-21 LAB — SALICYLATE LEVEL: Salicylate Lvl: <7 mg/dL (ref 2.8–30.0)

## 2019-02-21 LAB — PREGNANCY, URINE: Preg Test, Ur: NEGATIVE

## 2019-02-21 MED ORDER — QUETIAPINE FUMARATE 25 MG PO TABS
25.0000 mg | ORAL_TABLET | Freq: Every day | ORAL | Status: DC
  Administered 2019-02-22: 25 mg via ORAL
  Filled 2019-02-21: qty 1

## 2019-02-21 MED ORDER — SUCRALFATE 1 G PO TABS: 1.0000 g | ORAL_TABLET | Freq: Four times a day (QID) | ORAL | Status: DC | PRN

## 2019-02-21 MED ORDER — LAMOTRIGINE 25 MG PO TABS
25.0000 mg | ORAL_TABLET | Freq: Two times a day (BID) | ORAL | Status: DC
  Administered 2019-02-22: 25 mg via ORAL
  Filled 2019-02-21: qty 1

## 2019-02-21 MED ORDER — PRIMIDONE 50 MG PO TABS
50.0000 mg | ORAL_TABLET | Freq: Two times a day (BID) | ORAL | Status: DC
  Administered 2019-02-22: 01:00:00 50 mg via ORAL
  Filled 2019-02-21: qty 1

## 2019-02-21 MED ORDER — ALBUTEROL SULFATE HFA 108 (90 BASE) MCG/ACT IN AERS: 1.0000 | INHALATION_SPRAY | Freq: Four times a day (QID) | RESPIRATORY_TRACT | Status: DC | PRN

## 2019-02-21 MED ORDER — CIPROFLOXACIN HCL 500 MG PO TABS
500.0000 mg | ORAL_TABLET | Freq: Two times a day (BID) | ORAL | Status: DC
  Administered 2019-02-22: 500 mg via ORAL
  Filled 2019-02-21: qty 1

## 2019-02-21 MED ORDER — CARIPRAZINE HCL 3 MG PO CAPS
3.0000 mg | ORAL_CAPSULE | Freq: Every day | ORAL | Status: DC
  Administered 2019-02-22: 3 mg via ORAL
  Filled 2019-02-21 (×2): qty 1

## 2019-02-21 MED ORDER — METRONIDAZOLE 500 MG PO TABS
500.0000 mg | ORAL_TABLET | Freq: Three times a day (TID) | ORAL | Status: DC
  Administered 2019-02-22: 01:00:00 500 mg via ORAL
  Filled 2019-02-21: qty 1

## 2019-02-21 MED ORDER — CLONAZEPAM 0.5 MG PO TABS: 1.0000 mg | ORAL_TABLET | Freq: Three times a day (TID) | ORAL | Status: DC | PRN

## 2019-02-21 MED ORDER — VALACYCLOVIR HCL 500 MG PO TABS
500.0000 mg | ORAL_TABLET | Freq: Every day | ORAL | Status: DC
  Administered 2019-02-22: 01:00:00 500 mg via ORAL
  Filled 2019-02-21: qty 1

## 2019-02-21 NOTE — ED Notes (Signed)
Pt provided crackers, peanut butter, and water.

## 2019-02-21 NOTE — ED Notes (Signed)
Bed: [facility] Expected date:  Expected time:  Means of arrival:  Comments: 
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
## 2019-02-21 NOTE — BH Assessment (Signed)
[facility] [facility] Assessment Progress Note   Clinician informed Pearlie Oyster, PA of patient acceptance to [facility]-2 to Dr. Parke Poisson.  Patient can come after midnight.  Patient is voluntary and should sign voluntary admission papers prior to arrival.

## 2019-02-21 NOTE — ED Notes (Signed)
Family Contact # [phone] (cell) Home [phone] Monica Martinez (Fiance) Elige Radon Weitchpec)

## 2019-02-21 NOTE — ED Provider Notes (Signed)
[facility] DEPT Provider Note   CSN: [id number] Arrival date & time: 02/21/19  1659    History   Chief Complaint Chief Complaint  Patient presents with  . Suicidal    HPI [name] is a 33 y.o. female.     The history is provided by the patient and medical records. No language interpreter was used.   [name] is a 33 y.o. female  with a PMH of anxiety, bipolar disorder, substance abuse who presents to the Emergency Department complaining of suicidal thoughts.  Patient states that last several months have been very hard from her and she no longer feels like there is a reason for her to live.  She has been very stressed and her anxiety has been through the roof.  She does report being on several medications for her bipolar disorder and taking them as instructed.  Reports suicidal thoughts with plan to cut her wrists.  She does endorse previous suicide attempts several times in the past, but does not disclose information about this further.  She states that she was at her psychiatrist's office today and informed her about her plan to cut her wrist.  Her psychiatrist recommended that she come to the emergency department because she did not feel as if she was safe to go home in her current mental state.    Past Medical History:  Diagnosis Date  . Anxiety   . Asthma   . Bipolar 1 disorder ([place])   . Depression   . Endometriosis   . Mental disorder     Patient Active Problem List   Diagnosis Date Noted  . Severe recurrent major depression with psychotic features ([place]) 07/23/2017  . Substance use disorder 07/23/2017  . Pelvic pain in female 01/15/2015  . MDD (major depressive disorder), recurrent episode, severe ([place]) 03/11/2014  . MDD (major depressive disorder) 03/11/2014  . Bipolar disorder with depression ([place]) 09/14/2013  . Major depressive disorder, recurrent episode, severe ([place]) 12/30/2011    Class: Stage 3  . Bipolar  affective disorder, depressed, severe ([place]) 12/30/2011    Class: Acute    Past Surgical History:  Procedure Laterality Date  . ABDOMINAL SURGERY     "For endometriosis"  . CHOLECYSTECTOMY    . OOPHORECTOMY Right 2012  . TUBAL LIGATION       OB History    Gravida  4   Para  2   Term  2   Preterm  0   AB  2   Living  3     SAB  2   TAB  0   Ectopic  0   Multiple  0   Live Births  1            Home Medications    Prior to Admission medications   Medication Sig Start Date End Date Taking? Authorizing Provider  albuterol (PROVENTIL HFA;VENTOLIN HFA) 108 (90 BASE) MCG/ACT inhaler Inhale 1-2 puffs into the lungs every 6 (six) hours as needed for wheezing or shortness of breath.    Yes [provider]  CALCIUM-VITAMIN D PO Take 1 tablet by mouth daily.   Yes [provider]  cariprazine (VRAYLAR) capsule Take 3 mg by mouth daily.   Yes [provider]  ciprofloxacin (CIPRO) 500 MG tablet Take 1 tablet (500 mg total) by mouth 2 (two) times daily. 02/17/19  Yes Petrucelli, Samantha R, PA-C  clonazePAM (KLONOPIN) 1 MG tablet Take 1 mg by mouth 3 (  three) times daily as needed for anxiety.  01/24/19  Yes [provider]  lamoTRIgine (LAMICTAL) 25 MG tablet Take 25 mg by mouth 2 (two) times daily.  01/24/19  Yes [provider]  metroNIDAZOLE (FLAGYL) 500 MG tablet Take 1 tablet (500 mg total) by mouth 3 (three) times daily. 02/17/19  Yes Petrucelli, Samantha R, PA-C  primidone (MYSOLINE) 50 MG tablet Take 50 mg by mouth 2 (two) times daily. 01/24/19  Yes [provider]  QUEtiapine (SEROQUEL) 25 MG tablet Take 25 mg by mouth at bedtime. 01/24/19  Yes [provider]  sucralfate (CARAFATE) 1 g tablet Take 1 g by mouth 4 (four) times daily as needed (stomach pain).  10/26/18  Yes [provider]  valACYclovir (VALTREX) 500 MG tablet Take 500 mg by mouth daily.   Yes [provider]  ondansetron  (ZOFRAN ODT) 4 MG disintegrating tablet Take 1 tablet (4 mg total) by mouth every 8 (eight) hours as needed for nausea or vomiting. Patient not taking: Reported on 02/21/2019 02/17/19   Petrucelli, Pleas KochSamantha R, PA-C  oxyCODONE-acetaminophen (PERCOCET/ROXICET) 5-325 MG tablet Take 1 tablet by mouth every 6 (six) hours as needed for severe pain. Patient not taking: Reported on 02/21/2019 02/17/19   Petrucelli, Lelon MastSamantha R, PA-C  risperiDONE (RISPERDAL) 1 MG tablet Take 1 tablet (1 mg total) by mouth at bedtime. For mood control Patient not taking: Reported on 03/28/2018 07/26/17 02/17/19  Money, Gerlene Burdockravis B, FNP  traZODone (DESYREL) 50 MG tablet Take 1 tablet (50 mg total) by mouth at bedtime as needed for sleep. Patient not taking: Reported on 03/28/2018 07/26/17 02/17/19  Money, Gerlene Burdockravis B, FNP    Family History Family History  Problem Relation Age of Onset  . Stroke Mother   . Hyperlipidemia Other     Social History Social History   Tobacco Use  . Smoking status: Former Smoker    Packs/day: 0.50    Years: 13.00    Pack years: 6.50    Types: Cigarettes  . Smokeless tobacco: Never Used  Substance Use Topics  . Alcohol use: Not Currently    Comment: Drinks once a month  . Drug use: Yes    Types: Marijuana    Comment: daily     Allergies   Bee venom, Coconut flavor, Ibuprofen, and Naproxen   Review of Systems Review of Systems  Psychiatric/Behavioral: Positive for suicidal ideas.  All other systems reviewed and are negative.    Physical Exam Updated Vital Signs BP (!) 151/101 (BP Location: Right Arm)   Pulse 70   Temp 98.7 F (37.1 C) (Oral)   Resp 20   Ht 5\' 2"  (1.575 m)   Wt 82.6 kg   LMP 01/28/2019 (Exact Date) Comment: neg hcg  SpO2 100%   BMI 33.29 kg/m   Physical Exam Vitals signs and nursing note reviewed.  Constitutional:      General: She is not in acute distress.    Appearance: She is well-developed.  HENT:     Head: Normocephalic and atraumatic.  Neck:      Musculoskeletal: Neck supple.  Cardiovascular:     Rate and Rhythm: Normal rate and regular rhythm.     Heart sounds: Normal heart sounds. No murmur.  Pulmonary:     Effort: Pulmonary effort is normal. No respiratory distress.     Breath sounds: Normal breath sounds.  Abdominal:     General: There is no distension.     Palpations: Abdomen is soft.  Tenderness: There is no abdominal tenderness.  Skin:    General: Skin is warm and dry.  Neurological:     Mental Status: She is alert and oriented to person, place, and time.      ED Treatments / Results  Labs (all labs ordered are listed, but only abnormal results are displayed) Labs Reviewed  ACETAMINOPHEN LEVEL - Abnormal; Notable for the following components:      Result Value   Acetaminophen (Tylenol), Serum <10 (*)    All other components within normal limits  CBC - Abnormal; Notable for the following components:   Hemoglobin 15.5 (*)    All other components within normal limits  RAPID URINE DRUG SCREEN, HOSP PERFORMED - Abnormal; Notable for the following components:   Tetrahydrocannabinol RESULTS UNAVAILABLE DUE TO INTERFERING SUBSTANCE (*)    Barbiturates POSITIVE (*)    All other components within normal limits  ETHANOL  SALICYLATE LEVEL  PREGNANCY, URINE  COMPREHENSIVE METABOLIC PANEL    EKG None  Radiology No results found.  Procedures Procedures (including critical care time)  Medications Ordered in ED Medications - No data to display   Initial Impression / Assessment and Plan / ED Course  I have reviewed the triage vital signs and the nursing notes.  Pertinent labs & imaging results that were available during my care of the patient were reviewed by me and considered in my medical decision making (see chart for details).       Elvin SoChristina L Hounshell is a 33 y.o. female who presents to ED from psychiatrist's office for concerns over suicidal thoughts with plan to cut her wrists.  Psychiatrist was  concerned about her safety at home and recommending behavioral health admission.   Labs reassuring. Medically cleared. Disposition per TTS recommendations.   Final Clinical Impressions(s) / ED Diagnoses   Final diagnoses:  Suicidal thoughts    ED Discharge Orders    None       Devonna Oboyle, Chase PicketJaime Pilcher, PA-C 02/21/19 1958    Mancel BaleWentz, Elliott, MD 02/22/19 [phone]

## 2019-02-21 NOTE — ED Triage Notes (Signed)
Patient was at her psychiatrist's office today and verbalized that she was suicidal with a plan. Patient states her plan is to cut her wrist. Patient stated her psychiatrist did not think she was safe enough to return home. Patient denies any HI, auditory or visual hallucinations.  Patient states she did smoke marijuana yesterday but denies alcohol or any other drugs.

## 2019-02-22 ENCOUNTER — Inpatient Hospital Stay (HOSPITAL_COMMUNITY)
Admission: AD | Admit: 2019-02-22 | Discharge: 2019-02-23 | DRG: 885 | Disposition: A | Discharge status: Home / Self Care | Payer: Medicaid Other | Source: Intra-hospital | Attending: Psychiatry | Admitting: Psychiatry

## 2019-02-22 ENCOUNTER — Encounter (HOSPITAL_COMMUNITY): Payer: Self-pay

## 2019-02-22 DIAGNOSIS — F314 Bipolar disorder, current episode depressed, severe, without psychotic features: Secondary | ICD-10-CM | POA: Diagnosis not present

## 2019-02-22 DIAGNOSIS — F1220 Cannabis dependence, uncomplicated: Secondary | ICD-10-CM | POA: Diagnosis present

## 2019-02-22 DIAGNOSIS — Z9049 Acquired absence of other specified parts of digestive tract: Secondary | ICD-10-CM

## 2019-02-22 DIAGNOSIS — F419 Anxiety disorder, unspecified: Secondary | ICD-10-CM | POA: Diagnosis present

## 2019-02-22 DIAGNOSIS — Z915 Personal history of self-harm: Secondary | ICD-10-CM

## 2019-02-22 DIAGNOSIS — Z90722 Acquired absence of ovaries, bilateral: Secondary | ICD-10-CM | POA: Diagnosis not present

## 2019-02-22 DIAGNOSIS — R45851 Suicidal ideations: Secondary | ICD-10-CM | POA: Diagnosis present

## 2019-02-22 DIAGNOSIS — J45909 Unspecified asthma, uncomplicated: Secondary | ICD-10-CM | POA: Diagnosis present

## 2019-02-22 DIAGNOSIS — F1390 Sedative, hypnotic, or anxiolytic use, unspecified, uncomplicated: Secondary | ICD-10-CM | POA: Diagnosis present

## 2019-02-22 MED ORDER — ALUM & MAG HYDROXIDE-SIMETH 200-200-20 MG/5ML PO SUSP: 30.0000 mL | ORAL | Status: DC | PRN

## 2019-02-22 MED ORDER — ACETAMINOPHEN 325 MG PO TABS
650.0000 mg | ORAL_TABLET | Freq: Four times a day (QID) | ORAL | Status: DC | PRN
  Administered 2019-02-22 – 2019-02-23 (×3): 650 mg via ORAL
  Filled 2019-02-22 (×3): qty 2

## 2019-02-22 MED ORDER — GABAPENTIN 300 MG PO CAPS
300.0000 mg | ORAL_CAPSULE | Freq: Three times a day (TID) | ORAL | Status: DC
  Administered 2019-02-22 – 2019-02-23 (×4): 300 mg via ORAL
  Filled 2019-02-22 (×9): qty 1

## 2019-02-22 MED ORDER — BREXPIPRAZOLE 1 MG PO TABS
1.0000 mg | ORAL_TABLET | Freq: Two times a day (BID) | ORAL | Status: DC
  Administered 2019-02-22 – 2019-02-23 (×3): 1 mg via ORAL
  Filled 2019-02-22 (×7): qty 1

## 2019-02-22 MED ORDER — NICOTINE 21 MG/24HR TD PT24
21.0000 mg | MEDICATED_PATCH | Freq: Every day | TRANSDERMAL | Status: DC
  Administered 2019-02-22 – 2019-02-23 (×2): 21 mg via TRANSDERMAL
  Filled 2019-02-22 (×5): qty 1

## 2019-02-22 MED ORDER — CLONAZEPAM 0.5 MG PO TABS
0.5000 mg | ORAL_TABLET | Freq: Two times a day (BID) | ORAL | Status: DC
  Administered 2019-02-22 – 2019-02-23 (×2): 0.5 mg via ORAL
  Filled 2019-02-22 (×2): qty 1

## 2019-02-22 MED ORDER — SUCRALFATE 1 G PO TABS
1.0000 g | ORAL_TABLET | Freq: Three times a day (TID) | ORAL | Status: DC
  Administered 2019-02-22 – 2019-02-23 (×5): 1 g via ORAL
  Filled 2019-02-22 (×12): qty 1

## 2019-02-22 MED ORDER — NON FORMULARY: 1.0000 mg | Freq: Two times a day (BID) | Status: DC

## 2019-02-22 MED ORDER — VALACYCLOVIR HCL 500 MG PO TABS
500.0000 mg | ORAL_TABLET | Freq: Every day | ORAL | Status: DC
  Administered 2019-02-22 – 2019-02-23 (×2): 500 mg via ORAL
  Filled 2019-02-22 (×4): qty 1

## 2019-02-22 MED ORDER — HYDROXYZINE HCL 25 MG PO TABS
25.0000 mg | ORAL_TABLET | Freq: Three times a day (TID) | ORAL | Status: DC | PRN
  Administered 2019-02-22 – 2019-02-23 (×3): 25 mg via ORAL
  Filled 2019-02-22 (×3): qty 1

## 2019-02-22 MED ORDER — MAGNESIUM HYDROXIDE 400 MG/5ML PO SUSP: 30.0000 mL | Freq: Every day | ORAL | Status: DC | PRN

## 2019-02-22 MED ORDER — TEMAZEPAM 15 MG PO CAPS
30.0000 mg | ORAL_CAPSULE | Freq: Every day | ORAL | Status: DC
  Administered 2019-02-22: 30 mg via ORAL
  Filled 2019-02-22: qty 2

## 2019-02-22 MED ORDER — LAMOTRIGINE 25 MG PO TABS
25.0000 mg | ORAL_TABLET | Freq: Three times a day (TID) | ORAL | Status: DC
  Administered 2019-02-22 – 2019-02-23 (×4): 25 mg via ORAL
  Filled 2019-02-22 (×9): qty 1

## 2019-02-22 MED ORDER — TRAZODONE HCL 50 MG PO TABS: 50.0000 mg | ORAL_TABLET | Freq: Every evening | ORAL | Status: DC | PRN

## 2019-02-22 MED ORDER — ALBUTEROL SULFATE HFA 108 (90 BASE) MCG/ACT IN AERS
1.0000 | INHALATION_SPRAY | Freq: Four times a day (QID) | RESPIRATORY_TRACT | Status: DC
  Administered 2019-02-22: 2 via RESPIRATORY_TRACT
  Filled 2019-02-22: qty 6.7
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
## 2019-02-22 NOTE — ED Notes (Signed)
Report called, [facility] called 

## 2019-02-22 NOTE — [facility] Suicide Risk Assessment (Signed)
[facility] INPATIENT:  Family/Significant Other Suicide Prevention Education  Suicide Prevention Education:  Patient Refusal for Family/Significant Other Suicide Prevention Education: The patient [name] has refused to provide written consent for family/significant other to be provided Family/Significant Other Suicide Prevention Education during admission and/or prior to discharge.  Physician notified.  Joellen Jersey 02/22/2019, 10:49 AM
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
## 2019-02-22 NOTE — H&P (Signed)
Psychiatric Admission Assessment Adult  Patient Identification: [name] MRN:  [id number] Date of Evaluation:  02/22/2019 Chief Complaint:  bipolar 1 disorder depressed severe  cannabis use disorder severe  Principal Diagnosis: SI Diagnosis:  Active Problems:   Bipolar 1 disorder, depressed, severe (HCC)  History of Present Illness:  This is the latest of multiple psychiatric admissions and encounters for [name], 33 year old patient last here for similar presentation in November 2018.  She had presented to her primary care office disclose suicidal thoughts and was referred for inpatient stabilization as a result.  The patient believes that she is on lamotrigine and her current antidepressant that are helping a little and she is requesting specifically to escalate the dose.  She has had numerous diagnoses to include substance abuse disorder, chronic cannabis dependency/daily use since she was 33 years of age, bipolar depression, and chronic anxiety. She continues to endorse thoughts of self-harm without plans or intent here no thoughts of harming others can contract here and is agreeable to treatment plan outlined below which includes discontinuation of schedule compounds and escalation of lamotrigine and antidepressant dosings.  According to our assessment team evaluation of yesterday- [name] is an 33 y.o. female.  -Clinician reviewed note by Diane PicketJaime Pilcher Ward, PA.  [name] a 33 y.o.femalewith a PMH of anxiety, bipolar disorder, substance abusewho presents to the Emergency Department complaining of suicidal thoughts. Patient states that last several months have been very hard from her and she no longer feels like there is a reason for her to live. She has been very stressed and her anxiety has been through the roof. She does report being on several medications for her bipolar disorder and taking them as instructed. Reports suicidal thoughts with plan  to cut her wrists. She does endorse previous suicide attempts several times in the past, but does not disclose information about this further. She states that she was at her psychiatrist's office today and informed her about her plan to cut her wrist. Her psychiatrist recommended that she come to the emergency department because she did not feel as if she was safe to go home in her current mental state.  Patient is tearful during assessment.  She said that there is a lot going on in her life.  She said that she does not have custody of her two children.  She says that over the last few months have been depressing and it has been worse over the last two weeks.  She says that she does not feel life has any meaning anymore.  Patient says that she told her psychiatrist today that she felt unsafe to go home because she was having thoughts of cutting her wrists.  Patient has had multiple attempts to kill herself in the past.  Patient denies any HI or A/V hallucinations.  She does admit to smoking 2-3 blunts per day of marijuana.  Averages doing this every other day with yesterday being the last time.    Patient says that in addition to her children not being in her custody, she has financial issues.  She lives with her grandmother and her boyfriend lives with her also.  Patient is goal oriented with her answers.  She has good eye contact.  She does report severe anxiety lately and avoidance of being around others.  [facility].  Patient [facility] this month.  Today was her 2nd visit.  [facility].  -Clinician discussed patient care with Diane ConnJason Berry, FNP.  He recommended inpatient psychiatric care.  [name][id number] to Dr. Jama Flavorsobos after midnight. Associated Signs/Symptoms: Depression Symptoms:  depressed mood, (Hypo) Manic Symptoms:   n/a Anxiety Symptoms:  Panic Symptoms, Psychotic Symptoms:  n/a PTSD Symptoms: NA Total Time spent with patient: 45 minutes   Past Psychiatric History: Long history of mental illness. She has had multiple diagnosis over the years. Says she has been admitted eight times here and at various other hospitals. Reports she started cutting in her 1320's. She has cut self over ten times. She has taken an overdose twice. Says psychiatric admission was precipitated by most of those admissions. She has been tried on various medications over the years. Says Seroquel, Depakote and Abilify did not help. She was give Risperidone recently and she felt good on it.   Is the patient at risk to self? Yes.    Has the patient been a risk to self in the past 6 months? No.  Has the patient been a risk to self within the distant past? Yes.    Is the patient a risk to others? No.  Has the patient been a risk to others in the past 6 months? No.  Has the patient been a risk to others within the distant past? No.   Alcohol Screening: 1. How often do you have a drink containing alcohol?: Never 2. How many drinks containing alcohol do you have on a typical day when you are drinking?: 1 or 2 3. How often do you have six or more drinks on one occasion?: Never AUDIT-C Score: 0 9. Have you or someone else been injured as a result of your drinking?: No 10. Has a relative or friend or a doctor or another health worker been concerned about your drinking or suggested you cut down?: No Alcohol Use Disorder Identification Test Final Score (AUDIT): 0 Substance Abuse History in the last 12 months:  No. Consequences of Substance Abuse: NA Previous Psychotropic Medications: Yes  Psychological Evaluations: No  Past Medical History:  Past Medical History:  Diagnosis Date  . Anxiety   . Asthma   . Bipolar 1 disorder (HCC)   . Depression   . Endometriosis   . Mental disorder     Past Surgical History:  Procedure Laterality  Date  . ABDOMINAL SURGERY     "For endometriosis"  . CHOLECYSTECTOMY    . OOPHORECTOMY Right 2012  . TUBAL LIGATION     Family History:  Family History  Problem Relation Age of Onset  . Stroke Mother   . Hyperlipidemia Other    Family Psychiatric  History: see eval Tobacco Screening:   Social History:  Social History   Substance and Sexual Activity  Alcohol Use Not Currently   Comment: Drinks once a month     Social History   Substance and Sexual Activity  Drug Use Yes  . Types: Marijuana, Barbituates   Comment: daily    Additional Social History:                           Allergies:   Allergies  Allergen Reactions  . Bee Venom Anaphylaxis and Swelling  . Coconut Flavor Anaphylaxis  . Ibuprofen Hives  . Naproxen Nausea And Vomiting   Lab Results:  Results for orders placed or performed during the hospital  encounter of 02/21/19 (from the past 48 hour(s))  cbc     Status: Abnormal   Collection Time: 02/21/19  5:29 PM  Result Value Ref Range   WBC 6.8 4.0 - 10.5 K/uL   RBC 4.58 3.87 - 5.11 MIL/uL   Hemoglobin 15.5 (H) 12.0 - 15.0 g/dL   HCT 45.6 36.0 - 46.0 %   MCV 99.6 80.0 - 100.0 fL   MCH 33.8 26.0 - 34.0 pg   MCHC 34.0 30.0 - 36.0 g/dL   RDW 12.7 11.5 - 15.5 %   Platelets 161 150 - 400 K/uL   nRBC 0.0 0.0 - 0.2 %    Comment: Performed at [facility], [place] [address] [place], [place] [address]  Rapid urine drug screen (hospital performed)     Status: Abnormal   Collection Time: 02/21/19  5:52 PM  Result Value Ref Range   Opiates NONE DETECTED NONE DETECTED   Cocaine NONE DETECTED NONE DETECTED   Benzodiazepines NONE DETECTED NONE DETECTED   Amphetamines NONE DETECTED NONE DETECTED   Tetrahydrocannabinol RESULTS UNAVAILABLE DUE TO INTERFERING SUBSTANCE (A) NONE DETECTED   Barbiturates POSITIVE (A) NONE DETECTED    Comment: (NOTE) DRUG SCREEN FOR MEDICAL PURPOSES ONLY.  IF CONFIRMATION IS NEEDED FOR ANY PURPOSE, NOTIFY  LAB WITHIN 5 DAYS. LOWEST DETECTABLE LIMITS FOR URINE DRUG SCREEN Drug Class                     Cutoff (ng/mL) Amphetamine and metabolites    1000 Barbiturate and metabolites    200 Benzodiazepine                 381 Tricyclics and metabolites     300 Opiates and metabolites        300 Cocaine and metabolites        300 THC                            50 Performed at [facility], [place] [address], [place], [place] [address]   Pregnancy, urine     Status: None   Collection Time: 02/21/19  5:52 PM  Result Value Ref Range   Preg Test, Ur NEGATIVE NEGATIVE    Comment:        THE SENSITIVITY OF THIS METHODOLOGY IS >20 mIU/mL. Performed at [facility], [place] [address] [place], [place] [address]   Ethanol     Status: None   Collection Time: 02/21/19  6:24 PM  Result Value Ref Range   Alcohol, Ethyl (B) <10 <10 mg/dL    Comment: (NOTE) Lowest detectable limit for serum alcohol is 10 mg/dL. For medical purposes only. Performed at [facility], [place] [address], [place], [place] [address]   Salicylate level     Status: None   Collection Time: 02/21/19  6:24 PM  Result Value Ref Range   Salicylate Lvl <4.2 2.8 - 30.0 mg/dL    Comment: Performed at [facility], [place] [address] [place],  [address]  Acetaminophen level     Status: Abnormal   Collection Time: 02/21/19  6:24 PM  Result Value Ref Range   Acetaminophen (Tylenol), Serum <10 (L) 10 - 30 ug/mL    Comment: (NOTE) Therapeutic concentrations vary significantly. A range of 10-30 ug/mL  may be an effective concentration for many patients. However, some  are best treated at concentrations outside of this range.  Acetaminophen concentrations >150 ug/mL at 4 hours after ingestion  and >50 ug/mL at 12 hours after ingestion are often associated with  toxic reactions. Performed at [facility], [address], [place], [place]  [address]   Comprehensive metabolic panel     Status: None   Collection Time: 02/21/19  6:24 PM  Result Value Ref Range   Sodium 138 135 - 145 mmol/L   Potassium 3.9 3.5 - 5.1 mmol/L   Chloride 105 98 - 111 mmol/L   CO2 27 22 - 32 mmol/L   Glucose, Bld 96 70 - 99 mg/dL   BUN 8 6 - 20 mg/dL   Creatinine, Ser 6.04 0.44 - 1.00 mg/dL   Calcium 9.3 8.9 - 54.0 mg/dL   Total Protein 7.4 6.5 - 8.1 g/dL   Albumin 4.4 3.5 - 5.0 g/dL   AST 19 15 - 41 U/L   ALT 17 0 - 44 U/L   Alkaline Phosphatase 57 38 - 126 U/L   Total Bilirubin 0.6 0.3 - 1.2 mg/dL   GFR calc non Af Amer >60 >60 mL/min   GFR calc Af Amer >60 >60 mL/min   Anion gap 6 5 - 15    Comment: Performed at [facility], [address] [place], [place] [address]    Blood Alcohol level:  Lab Results  Component Value Date   ETH <10 02/21/2019   ETH <10 07/22/2017    Metabolic Disorder Labs:  No results found for: HGBA1C, MPG No results found for: PROLACTIN No results found for: CHOL, TRIG, HDL, CHOLHDL, VLDL, LDLCALC  Current Medications: Current Facility-Administered Medications  Medication Dose Route Frequency Provider Last Rate Last Dose  . acetaminophen (TYLENOL) tablet 650 mg  650 mg Oral Q6H PRN Diane Conn A, NP   650 mg at 02/22/19 0818  . alum & mag hydroxide-simeth (MAALOX/MYLANTA) 200-200-20 MG/5ML suspension 30 mL  30 mL Oral Q4H PRN Diane Conn A, NP      . hydrOXYzine (ATARAX/VISTARIL) tablet 25 mg  25 mg Oral TID PRN Diane Conn A, NP   25 mg at 02/22/19 0818  . magnesium hydroxide (MILK OF MAGNESIA) suspension 30 mL  30 mL Oral Daily PRN Diane Conn A, NP      . traZODone (DESYREL) tablet 50 mg  50 mg Oral QHS PRN Jackelyn Poling, NP       PTA Medications: Medications Prior to Admission  Medication Sig Dispense Refill Last Dose  . albuterol (PROVENTIL HFA;VENTOLIN HFA) 108 (90 BASE) MCG/ACT inhaler Inhale 1-2 puffs into the lungs every 6 (six) hours as needed for wheezing or shortness of  breath.      Marland Kitchen CALCIUM-VITAMIN D PO Take 1 tablet by mouth daily.     . cariprazine (VRAYLAR) capsule Take 3 mg by mouth daily.     . ciprofloxacin (CIPRO) 500 MG tablet Take 1 tablet (500 mg total) by mouth 2 (two) times daily. 14 tablet 0   . clonazePAM (KLONOPIN) 1 MG tablet Take 1 mg by mouth 3 (three) times daily as needed for anxiety.      . lamoTRIgine (LAMICTAL) 25 MG tablet Take 25 mg by mouth 2 (two) times daily.      . metroNIDAZOLE (FLAGYL) 500 MG tablet Take 1 tablet (500 mg total) by mouth 3 (three) times daily. 21 tablet 0   . ondansetron (ZOFRAN ODT) 4 MG disintegrating tablet Take 1 tablet (4 mg total) by mouth every 8 (eight) hours as needed  for nausea or vomiting. (Patient not taking: Reported on 02/21/2019) 5 tablet 0   . oxyCODONE-acetaminophen (PERCOCET/ROXICET) 5-325 MG tablet Take 1 tablet by mouth every 6 (six) hours as needed for severe pain. (Patient not taking: Reported on 02/21/2019) 6 tablet 0   . primidone (MYSOLINE) 50 MG tablet Take 50 mg by mouth 2 (two) times daily.     . QUEtiapine (SEROQUEL) 25 MG tablet Take 25 mg by mouth at bedtime.     . sucralfate (CARAFATE) 1 g tablet Take 1 g by mouth 4 (four) times daily as needed (stomach pain).      . valACYclovir (VALTREX) 500 MG tablet Take 500 mg by mouth daily.       Musculoskeletal: Strength & Muscle Tone: within normal limits Gait & Station: normal Patient leans: N/A  Psychiatric Specialty Exam: Physical Exam cardiovascular system normal/no involuntary movements  ROS recent GI pain that resolved/ER eval, respiratory history of asthma/GI/GU history of endometriosis/neurological negative  Blood pressure 120/79, pulse 63, temperature 98.6 F (37 C), temperature source Oral, resp. rate 18, height 5\' 2"  (1.575 m), weight 83.9 kg, last menstrual period 01/28/2019, SpO2 100 %.Body mass index is 33.84 kg/m.  General Appearance: Casual and Disheveled  Eye Contact:  Good  Speech: nl  Volume:  Decreased  Mood:   Dysphoric  Affect:  Congruent  Thought Process:  Coherent and Descriptions of Associations: Intact  Orientation:  Full (Time, Place, and Person)  Thought Content:  Logical  Suicidal Thoughts:  Yes.  without intent/plan  Homicidal Thoughts:  No  Memory:  Immediate;   Fair  Judgement:  Fair  Insight:  Good  Psychomotor Activity:  Normal  Concentration:  Concentration: Fair  Recall:  Fair  Fund of Knowledge:  Negative  Language:  Fair  Akathisia:  Negative  Handed:  Right  AIMS (if indicated):     Assets:  Physical Health Resilience Social Support  ADL's:  Intact  Cognition:  WNL  Sleep:  Number of Hours: 2.75    Treatment Plan Summary: Daily contact with patient to assess and evaluate symptoms and progress in treatment, Medication management and Plan Adjust current med regimen avoid schedule II drugs cognitive therapy  Observation Level/Precautions:  15 minute checks  Laboratory:  UDS  Psychotherapy: Cognitive  Medications: Specifically requesting escalation in lamotrigine and SRI  Consultations: Not necessary  Discharge Concerns: Longer-term stability  Estimated LOS: 5 days  Other: Axis I bipolar depression without psychosis/cannabis dependency chronically/also schedule II prescriptions in the context of this dependency which are not indicated   Physician Treatment Plan for Primary Diagnosis: <principal problem not specified> Long Term Goal(s): Improvement in symptoms so as ready for discharge  Short Term Goals: Ability to identify changes in lifestyle to reduce recurrence of condition will improve, Ability to verbalize feelings will improve, Ability to disclose and discuss suicidal ideas, Ability to demonstrate self-control will improve and Ability to identify and develop effective coping behaviors will improve  Physician Treatment Plan for Secondary Diagnosis: Active Problems:   Bipolar 1 disorder, depressed, severe (HCC)  Long Term Goal(s): Improvement in symptoms so  as ready for discharge  Short Term Goals: Ability to demonstrate self-control will improve, Ability to identify and develop effective coping behaviors will improve and Ability to maintain clinical measurements within normal limits will improve  I certify that inpatient services furnished can reasonably be expected to improve the patient's condition.    Malvin JohnsFARAH,Durante Violett, MD 6/25/202011:06 AM

## 2019-02-22 NOTE — Progress Notes (Signed)
Patient presents with depressed/sad/flat affect and behavior during admission interview and assessment. VS monitored and recorded. Skin check performed with taffney mht and revealed generalized bodyacne. Contraband was not found. Patient was oriented to unit and schedule. Pt states "I am here because I want to cut my wrists. I lost everything. My kids. My money. It's all gone". Pt denies HI/AVH and endorses passive SI at this time. PO fluids provided. Safety maintained. Rest encouraged.

## 2019-02-22 NOTE — Progress Notes (Signed)
[facility] Group Notes:  (Nursing/MHT/Case Management/Adjunct)  Date:  02/22/2019  Time:  2030  Type of Therapy:  wrap up group  Participation Level:  Active  Participation Quality:  Appropriate, Attentive, Sharing and Supportive  Affect:  Appropriate  Cognitive:  Appropriate  Insight:  Improving  Engagement in Group:  Engaged  Modes of Intervention:  Clarification, Education and Support  Summary of Progress/Problems:  [name] 02/22/2019, 10:20 PM

## 2019-02-23 MED ORDER — LAMOTRIGINE 25 MG PO TABS: 50.0000 mg | ORAL_TABLET | Freq: Two times a day (BID) | ORAL | 2 refills | Status: DC

## 2019-02-23 MED ORDER — GABAPENTIN 300 MG PO CAPS: 300.0000 mg | ORAL_CAPSULE | Freq: Three times a day (TID) | ORAL | 2 refills | Status: DC

## 2019-02-23 MED ORDER — REXULTI 1 MG PO TABS: 1.0000 mg | ORAL_TABLET | Freq: Two times a day (BID) | ORAL | 2 refills | Status: DC

## 2019-02-23 NOTE — Tx Team (Signed)
Interdisciplinary Treatment and Diagnostic Plan Update  02/23/2019 Time of Session: 9:00am [name] MRN: [id number]  Principal Diagnosis: <principal problem not specified>  Secondary Diagnoses: Active Problems:   Bipolar 1 disorder, depressed, severe (HCC)   Current Medications:  Current Facility-Administered Medications  Medication Dose Route Frequency Provider Last Rate Last Dose  . acetaminophen (TYLENOL) tablet 650 mg  650 mg Oral Q6H PRN Lindon Romp A, NP   650 mg at 02/23/19 0357  . albuterol (VENTOLIN HFA) 108 (90 Base) MCG/ACT inhaler 1-2 puff  1-2 puff Inhalation Q6H Johnn Hai, MD   2 puff at 02/22/19 1952  . alum & mag hydroxide-simeth (MAALOX/MYLANTA) 200-200-20 MG/5ML suspension 30 mL  30 mL Oral Q4H PRN Rozetta Nunnery, NP      . Brexpiprazole TABS 1 mg  1 mg Oral BID Cobos, Myer Peer, MD   1 mg at 02/23/19 0736  . clonazePAM (KLONOPIN) tablet 0.5 mg  0.5 mg Oral BID Johnn Hai, MD   0.5 mg at 02/23/19 0738  . gabapentin (NEURONTIN) capsule 300 mg  300 mg Oral TID Johnn Hai, MD   300 mg at 02/23/19 0736  . hydrOXYzine (ATARAX/VISTARIL) tablet 25 mg  25 mg Oral TID PRN Rozetta Nunnery, NP   25 mg at 02/23/19 0357  . lamoTRIgine (LAMICTAL) tablet 25 mg  25 mg Oral TID Johnn Hai, MD   25 mg at 02/23/19 0737  . magnesium hydroxide (MILK OF MAGNESIA) suspension 30 mL  30 mL Oral Daily PRN Lindon Romp A, NP      . nicotine (NICODERM CQ - dosed in mg/24 hours) patch 21 mg  21 mg Transdermal Daily Cobos, Myer Peer, MD   21 mg at 02/23/19 0737  . sucralfate (CARAFATE) tablet 1 g  1 g Oral TID WC & HS Johnn Hai, MD   1 g at 02/23/19 0736  . temazepam (RESTORIL) capsule 30 mg  30 mg Oral QHS Johnn Hai, MD   30 mg at 02/22/19 2151  . traZODone (DESYREL) tablet 50 mg  50 mg Oral QHS PRN Lindon Romp A, NP      . valACYclovir (VALTREX) tablet 500 mg  500 mg Oral Daily Johnn Hai, MD   500 mg at 02/23/19 9485   PTA Medications: Medications Prior to Admission   Medication Sig Dispense Refill Last Dose  . albuterol (PROVENTIL HFA;VENTOLIN HFA) 108 (90 BASE) MCG/ACT inhaler Inhale 1-2 puffs into the lungs every 6 (six) hours as needed for wheezing or shortness of breath.      Marland Kitchen CALCIUM-VITAMIN D PO Take 1 tablet by mouth daily.     . cariprazine (VRAYLAR) capsule Take 3 mg by mouth daily.     . ciprofloxacin (CIPRO) 500 MG tablet Take 1 tablet (500 mg total) by mouth 2 (two) times daily. 14 tablet 0   . clonazePAM (KLONOPIN) 1 MG tablet Take 1 mg by mouth 3 (three) times daily as needed for anxiety.      . lamoTRIgine (LAMICTAL) 25 MG tablet Take 25 mg by mouth 2 (two) times daily.      . metroNIDAZOLE (FLAGYL) 500 MG tablet Take 1 tablet (500 mg total) by mouth 3 (three) times daily. 21 tablet 0   . ondansetron (ZOFRAN ODT) 4 MG disintegrating tablet Take 1 tablet (4 mg total) by mouth every 8 (eight) hours as needed for nausea or vomiting. (Patient not taking: Reported on 02/21/2019) 5 tablet 0   . oxyCODONE-acetaminophen (PERCOCET/ROXICET) 5-325 MG tablet Take 1  tablet by mouth every 6 (six) hours as needed for severe pain. (Patient not taking: Reported on 02/21/2019) 6 tablet 0   . primidone (MYSOLINE) 50 MG tablet Take 50 mg by mouth 2 (two) times daily.     . QUEtiapine (SEROQUEL) 25 MG tablet Take 25 mg by mouth at bedtime.     . sucralfate (CARAFATE) 1 g tablet Take 1 g by mouth 4 (four) times daily as needed (stomach pain).      . valACYclovir (VALTREX) 500 MG tablet Take 500 mg by mouth daily.       Patient Stressors:    Patient Strengths:    Treatment Modalities: Medication Management, Group therapy, Case management,  1 to 1 session with clinician, Psychoeducation, Recreational therapy.   Physician Treatment Plan for Primary Diagnosis: <principal problem not specified> Long Term Goal(s): Improvement in symptoms so as ready for discharge Improvement in symptoms so as ready for discharge   Short Term Goals: Ability to identify changes in  lifestyle to reduce recurrence of condition will improve Ability to verbalize feelings will improve Ability to disclose and discuss suicidal ideas Ability to demonstrate self-control will improve Ability to identify and develop effective coping behaviors will improve Ability to demonstrate self-control will improve Ability to identify and develop effective coping behaviors will improve Ability to maintain clinical measurements within normal limits will improve  Medication Management: Evaluate patient's response, side effects, and tolerance of medication regimen.  Therapeutic Interventions: 1 to 1 sessions, Unit Group sessions and Medication administration.  Evaluation of Outcomes: Adequate for Discharge  Physician Treatment Plan for Secondary Diagnosis: Active Problems:   Bipolar 1 disorder, depressed, severe (HCC)  Long Term Goal(s): Improvement in symptoms so as ready for discharge Improvement in symptoms so as ready for discharge   Short Term Goals: Ability to identify changes in lifestyle to reduce recurrence of condition will improve Ability to verbalize feelings will improve Ability to disclose and discuss suicidal ideas Ability to demonstrate self-control will improve Ability to identify and develop effective coping behaviors will improve Ability to demonstrate self-control will improve Ability to identify and develop effective coping behaviors will improve Ability to maintain clinical measurements within normal limits will improve     Medication Management: Evaluate patient's response, side effects, and tolerance of medication regimen.  Therapeutic Interventions: 1 to 1 sessions, Unit Group sessions and Medication administration.  Evaluation of Outcomes: Adequate for Discharge   RN Treatment Plan for Primary Diagnosis: <principal problem not specified> Long Term Goal(s): Knowledge of disease and therapeutic regimen to maintain health will improve  Short Term Goals:  Ability to disclose and discuss suicidal ideas and Ability to identify and develop effective coping behaviors will improve  Medication Management: RN will administer medications as ordered by provider, will assess and evaluate patient's response and provide education to patient for prescribed medication. RN will report any adverse and/or side effects to prescribing provider.  Therapeutic Interventions: 1 on 1 counseling sessions, Psychoeducation, Medication administration, Evaluate responses to treatment, Monitor vital signs and CBGs as ordered, Perform/monitor CIWA, COWS, AIMS and Fall Risk screenings as ordered, Perform wound care treatments as ordered.  Evaluation of Outcomes: Adequate for Discharge   LCSW Treatment Plan for Primary Diagnosis: <principal problem not specified> Long Term Goal(s): Safe transition to appropriate next level of care at discharge, Engage patient in therapeutic group addressing interpersonal concerns.  Short Term Goals: Engage patient in aftercare planning with referrals and resources, Increase social support, Identify triggers associated with mental health/substance abuse issues  and Increase skills for wellness and recovery  Therapeutic Interventions: Assess for all discharge needs, 1 to 1 time with Social worker, Explore available resources and support systems, Assess for adequacy in community support network, Educate family and significant other(s) on suicide prevention, Complete Psychosocial Assessment, Interpersonal group therapy.  Evaluation of Outcomes: Adequate for Discharge   Progress in Treatment: Attending groups: No. Participating in groups: No. Taking medication as prescribed: Yes. Toleration medication: Yes. Family/Significant other contact made: No, will contact:  delines consents, SPE reviewed with patient Patient understands diagnosis: Yes. Discussing patient identified problems/goals with staff: Yes. Medical problems stabilized or resolved:  Yes. Denies suicidal/homicidal ideation: Yes. Issues/concerns per patient self-inventory: No.  New problem(s) identified: Yes, Describe:  limited supports, financial stressors  New Short Term/Long Term Goal(s): detox, medication management for mood stabilization; elimination of SI thoughts; development of comprehensive mental wellness/sobriety plan.  Patient Goals:    Discharge Plan or Barriers: going home, [facility]  Reason for Continuation of Hospitalization: Anxiety Depression  Estimated Length of Stay: discharge today  Attendees: Patient: 02/23/2019 10:50 AM  Physician:  02/23/2019 10:50 AM  Nursing:  02/23/2019 10:50 AM  RN Care Manager: 02/23/2019 10:50 AM  Social Worker: Enid Cutterharlotte Qunicy Higinbotham, LCSWA 02/23/2019 10:50 AM  Recreational Therapist:  02/23/2019 10:50 AM  Other:  02/23/2019 10:50 AM  Other:  02/23/2019 10:50 AM  Other: 02/23/2019 10:50 AM    Scribe for Treatment Team: Darreld Mcleanharlotte C Brison Fiumara, LCSWA 02/23/2019 10:50 AM

## 2019-02-23 NOTE — Discharge Summary (Addendum)
Physician Discharge Summary Note  Patient:  [name] is an 33 y.o., female MRN:  [id number] DOB:  Jul 07, 1986 Patient phone:  [phone] (home)  Patient address:   [address]  [place] [address],  Total Time spent with patient: 15 minutes  Date of Admission:  02/22/2019 Date of Discharge: 02/23/19  Reason for Admission:  suicidal ideation  Principal Problem: <principal problem not specified> Discharge Diagnoses: Active Problems:   Bipolar 1 disorder, depressed, severe ([place])   Past Psychiatric History: Per admission H&P: Long history of mental illness. She has had multiple diagnosis over the years. Says she has been admitted eight times here and at various other hospitals. Reports she started cutting in her 22's. She has cut self over ten times. She has taken an overdose twice. Says psychiatric admission was precipitated by most of those admissions. She has been tried on various medications over the years. Says Seroquel, Depakote and Abilify did not help. She was give Risperidone recently and she felt good on it.  Past Medical History:  Past Medical History:  Diagnosis Date  . Anxiety   . Asthma   . Bipolar 1 disorder ([place])   . Depression   . Endometriosis   . Mental disorder     Past Surgical History:  Procedure Laterality Date  . ABDOMINAL SURGERY     "For endometriosis"  . CHOLECYSTECTOMY    . OOPHORECTOMY Right 2012  . TUBAL LIGATION     Family History:  Family History  Problem Relation Age of Onset  . Stroke Mother   . Hyperlipidemia Other    Family Psychiatric  History: Denies Social History:  Social History   Substance and Sexual Activity  Alcohol Use Not Currently   Comment: Drinks once a month     Social History   Substance and Sexual Activity  Drug Use Yes  . Types: Marijuana, Barbituates   Comment: daily    Social History   Socioeconomic History  . Marital status: Legally Separated    Spouse name: Not on file  . Number of  children: Not on file  . Years of education: Not on file  . Highest education level: Not on file  Occupational History  . Not on file  Social Needs  . Financial resource strain: Not on file  . Food insecurity    Worry: Not on file    Inability: Not on file  . Transportation needs    Medical: Not on file    Non-medical: Not on file  Tobacco Use  . Smoking status: Former Research scientist (life sciences)  . Smokeless tobacco: Never Used  Substance and Sexual Activity  . Alcohol use: Not Currently    Comment: Drinks once a month  . Drug use: Yes    Types: Marijuana, Barbituates    Comment: daily  . Sexual activity: Yes    Birth control/protection: Surgical  Lifestyle  . Physical activity    Days per week: Not on file    Minutes per session: Not on file  . Stress: Not on file  Relationships  . Social Herbalist on phone: Not on file    Gets together: Not on file    Attends religious service: Not on file    Active member of club or organization: Not on file    Attends meetings of clubs or organizations: Not on file    Relationship status: Not on file  Other Topics Concern  . Not on file  Social History Narrative  .  Not on file    Hospital Course: From admission assessment: [name] a 33 y.o.femalewith a PMH of anxiety, bipolar disorder, substance abusewho presents to the Emergency Department complaining of suicidal thoughts. Patient states that last several months have been very hard from her and she no longer feels like there is a reason for her to live. She has been very stressed and her anxiety has been through the roof. She does report being on several medications for her bipolar disorder and taking them as instructed. Reports suicidal thoughts with plan to cut her wrists. She does endorse previous suicide attempts several times in the past, but does not disclose information about this further. She states that she was at her psychiatrist's office today and informed her  about her plan to cut her wrist. Her psychiatrist recommended that she come to the emergency department because she did not feel as if she was safe to go home in her current mental state. Patient is tearful during assessment.  She said that there is a lot going on in her life.  She said that she does not have custody of her two children.  She says that over the last few months have been depressing and it has been worse over the last two weeks.  She says that she does not feel life has any meaning anymore. Patient says that she told her psychiatrist today that she felt unsafe to go home because she was having thoughts of cutting her wrists.  Patient has had multiple attempts to kill herself in the past. Patient denies any HI or A/V hallucinations.  She does admit to smoking 2-3 blunts per day of marijuana.  Averages doing this every other day with yesterday being the last time. Patient says that in addition to her children not being in her custody, she has financial issues.  She lives with her grandmother and her boyfriend lives with her also. Patient is goal oriented with her answers.  She has good eye contact.  She does report severe anxiety lately and avoidance of being around others.  Patient [facility]. [facility].  Today was her 2nd visit.    From admission H&P: This is the latest of multiple psychiatric admissions and encounters for [name], 33 year old patient last here for similar presentation in November 2018.  She had presented to her primary care office disclose suicidal thoughts and was referred for inpatient stabilization as a result.  The patient believes that she is on lamotrigine and her current antidepressant that are helping a little and she is requesting specifically to escalate the dose.  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 She continues to endorse thoughts of self-harm without plans or intent here no thoughts of harming others can contract here and is agreeable to treatment plan outlined below which includes discontinuation of schedule compounds and escalation of lamotrigine and antidepressant dosings.  [name] was admitted for depression with suicidal ideation. Lamictal was increased. Gabapentin and Rexulti were started. Klonopin was decreased and then discontinued. [facility]. Per MD's discharge SRA: In summary, [name] was admitted voluntarily she requested an escalation in her lamotrigine and she was fearful that she was not on the right amount of medications.  [name]  as far as her treatment plan was concerned was that I believe she was not a candidate for clonazepam, opiates given her history of polysubstance abuse and chronic cannabis dependency, so when we outlined the treatment plan to discontinue opiates and taper her off of her clonazepam she requested discharge home.  On 6/26 she was alert oriented denied suicidal thoughts could contract fully.  [name] is discharging on the medications listed below. She has shown improvement with improved mood, affect, and interaction. She denies any SI/HI/AVH and contracts for safety. [facility]see below). Patient is provided with prescriptions for medications upon discharge. Her boyfriend is picking her up for discharge home.  Physical Findings: AIMS: Facial and Oral Movements Muscles of Facial Expression: None, normal Lips and Perioral Area: None, normal Jaw: None, normal Tongue: None, normal,Extremity Movements Upper (arms, wrists, hands, fingers): None, normal Lower (legs, knees, ankles, toes): None, normal, Trunk Movements Neck, shoulders, hips: None, normal, Overall  Severity Severity of abnormal movements (highest score from questions above): None, normal Incapacitation due to abnormal movements: None, normal Patient's awareness of abnormal movements (rate only patient's report): No Awareness, Dental Status Current problems with teeth and/or dentures?: No Does patient usually wear dentures?: No  CIWA:    COWS:     Musculoskeletal: Strength & Muscle Tone: within normal limits Gait & Station: normal Patient leans: N/A  Psychiatric Specialty Exam: Physical Exam  Nursing note and vitals reviewed. Constitutional: She is oriented to person, place, and time. She appears well-developed and well-nourished.  Cardiovascular: Normal rate.  Respiratory: Effort normal.  Neurological: She is alert and oriented to person, place, and time.    Review of Systems  Constitutional: Negative.   Respiratory: Negative for cough and shortness of breath.   Cardiovascular: Negative for chest pain.  Gastrointestinal: Negative for diarrhea, nausea and vomiting.  Neurological: Negative for tremors and headaches.  Psychiatric/Behavioral: Positive for depression (stable on medication) and substance abuse. Negative for hallucinations and suicidal ideas. The patient is not nervous/anxious and does not have insomnia.     Blood pressure (!) 122/91, pulse 87, temperature 98.2 F (36.8 C), temperature source Oral, resp. rate 16, height 5\' 2"  (1.575 m), weight 83.9 kg, last menstrual period 01/28/2019, SpO2 100 %.Body mass index is 33.84 kg/m.  See MD's discharge SRA      Has this patient used any form of tobacco in the last 30 days? (Cigarettes, Smokeless Tobacco, Cigars, and/or Pipes)  Yes, A prescription for an FDA-approved tobacco cessation medication was offered at discharge and the patient refused  Blood Alcohol level:  Lab Results  Component Value Date   ETH <10 02/21/2019   ETH <10 07/22/2017    Metabolic Disorder Labs:  No results found for: HGBA1C, MPG No  results found for: PROLACTIN No results found for: CHOL, TRIG, HDL, CHOLHDL, VLDL, LDLCALC  See Psychiatric Specialty Exam and Suicide Risk Assessment completed by Attending Physician prior to discharge.  Discharge destination:  Home  Is patient on multiple antipsychotic therapies at discharge:  No   Has Patient had three or more failed trials of antipsychotic monotherapy by history:  No  Recommended Plan for Multiple Antipsychotic Therapies: NA   Allergies as of 02/23/2019      Reactions   Bee Venom Anaphylaxis, Swelling   Coconut Flavor Anaphylaxis   Ibuprofen Hives   Naproxen Nausea And Vomiting      Medication List    STOP taking these medications  clonazePAM 1 MG tablet Commonly known as: KLONOPIN   oxyCODONE-acetaminophen 5-325 MG tablet Commonly known as: PERCOCET/ROXICET   primidone 50 MG tablet Commonly known as: MYSOLINE   QUEtiapine 25 MG tablet Commonly known as: SEROQUEL   Vraylar capsule Generic drug: cariprazine     TAKE these medications     Indication  albuterol 108 (90 Base) MCG/ACT inhaler Commonly known as: VENTOLIN HFA Inhale 1-2 puffs into the lungs every 6 (six) hours as needed for wheezing or shortness of breath.  Indication: Asthma   CALCIUM-VITAMIN D PO Take 1 tablet by mouth daily.  Indication: 3 Second R-R Interval   ciprofloxacin 500 MG tablet Commonly known as: CIPRO Take 1 tablet (500 mg total) by mouth 2 (two) times daily.  Indication: Gastroenteritis   gabapentin 300 MG capsule Commonly known as: NEURONTIN Take 1 capsule (300 mg total) by mouth 3 (three) times daily.  Indication: Abuse or Misuse of Alcohol   lamoTRIgine 25 MG tablet Commonly known as: LAMICTAL Take 2 tablets (50 mg total) by mouth 2 (two) times daily. What changed: how much to take  Indication: Manic-Depression   metroNIDAZOLE 500 MG tablet Commonly known as: FLAGYL Take 1 tablet (500 mg total) by mouth 3 (three) times daily.  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  Indication: Nausea and Vomiting   Rexulti 1 MG Tabs Generic drug: Brexpiprazole Take 1 tablet (1 mg total) by mouth 2 (two) times a day.  Indication: Behavioral Disorders associated with Dementia   sucralfate 1 g tablet Commonly known as: CARAFATE Take 1 g by mouth 4 (four) times daily as needed (stomach pain).  Indication: Ulcer of the Duodenum   valACYclovir 500 MG tablet Commonly known as: VALTREX Take 500 mg by mouth daily.  Indication: Herpes Simplex Infection      Follow-up Information    [facility] Follow up on 02/26/2019.   Specialty: Professional Counselor Why: Please follow up during walk-in hours for services on Monday, 6/29 at 8:30a.  Be sure to bring your photo ID, insurance card, SSN, current medications and discharge paperwork from this hospitalization.  Contact information: [facility] [address] [place] [address] [phone]           Follow-up recommendations: Activity as tolerated. Diet as recommended by primary care physician. Keep all scheduled follow-up appointments as recommended.   Comments:   Patient is instructed to take all prescribed medications as recommended. Report any side effects or adverse reactions to your outpatient psychiatrist. Patient is instructed to abstain from alcohol and illegal drugs while on prescription medications. In the event of worsening symptoms, patient is instructed to call the crisis hotline, 911, or go to the nearest emergency department for evaluation and treatment.  Signed: Aldean BakerJanet E Teghan Philbin, NP 02/23/2019, 11:07 AM

## 2019-02-23 NOTE — [facility] Suicide Risk Assessment (Signed)
[facility] Discharge Suicide Risk Assessment   Principal Problem: Reporting depressive symptoms Discharge Diagnoses: Active Problems:   Bipolar 1 disorder, depressed, severe ([place])  In summary, [name] was admitted voluntarily she requested an escalation in her lamotrigine and she was fearful that she was not on the right amount of medications.  Where we had the difference as far as her treatment plan was concerned was that I believe she was not a candidate for clonazepam, opiates given her history of polysubstance abuse and chronic cannabis dependency, so when we outlined the treatment plan to discontinue opiates and taper her off of her clonazepam she requested discharge home.  On 6/26 she was alert oriented denied suicidal thoughts could contract fully.  Total Time spent with patient: 45 minutes  Musculoskeletal: Strength & Muscle Tone: within normal limits Gait & Station: normal Patient leans: N/A  Psychiatric Specialty Exam: ROS  Blood pressure (!) 122/91, pulse 87, temperature 98.2 F (36.8 C), temperature source Oral, resp. rate 16, height 5\' 2"  (1.575 m), weight 83.9 kg, last menstrual period 01/28/2019, SpO2 100 %.Body mass index is 33.84 kg/m.  General Appearance: Casual  Eye Contact::  Good  Speech:  Clear and [id number]  Volume:  Decreased  Mood:  Dysphoric  Affect:  Constricted  Thought Process:  Coherent  Orientation:  Full (Time, Place, and Person)  Thought Content:  Tangential  Suicidal Thoughts:  No  Homicidal Thoughts:  No  Memory:  Immediate;   Fair  Judgement:  Fair  Insight:  Fair  Psychomotor Activity:  Normal  Concentration:  Fair  Recall:  [employer] of Knowledge:Fair  Language: Fair  Akathisia:  Negative  Handed:  Right  AIMS (if indicated):     Assets:  Communication Skills Leisure Time  Sleep:  Number of Hours: 4.5  Cognition: WNL  ADL's:  Intact   Mental Status Per Nursing Assessment::   On Admission:  Self-harm thoughts  Demographic Factors:   Caucasian  Loss Factors: Decline in physical health  Historical Factors: NA  Risk Reduction Factors:   Religious beliefs about death  Continued Clinical Symptoms:  Alcohol/Substance Abuse/Dependencies  Cognitive Features That Contribute To Risk:  None    Suicide Risk:  Minimal: No identifiable suicidal ideation.  Patients presenting with no risk factors but with morbid ruminations; may be classified as minimal risk based on the severity of the depressive symptoms    Plan Of Care/Follow-up recommendations:  Activity:  full  Dorien Bessent, MD 02/23/2019, 8:07 AM

## 2019-02-23 NOTE — Progress Notes (Signed)
D: Pt was in dayroom upon initial approach.  Pt presents with depressed affect and mood.  She reports her day was "all right" and her goal is "trying to get better."  Pt denies SI/HI, denies hallucinations, reports chronic back pain of 8/10.  Pt has been visible in milieu interacting with peers and staff appropriately.     A: Introduced self to pt.  Met with pt 1:1.  Actively listened to pt and offered support and encouragement.  Medications administered per order.  PRN medication administered for anxiety and pain.  15 minute safety checks in place.  R: Pt is safe on the unit.  Pt is compliant with medications.  Pt verbally contracts for safety.  Will continue to monitor and assess.

## 2019-02-23 NOTE — Progress Notes (Signed)
  [facility] Adult Case Management Discharge Plan :  Will you be returning to the same living situation after discharge:  Yes,  home At discharge, do you have transportation home?: Yes,  boyfriend will pick up Do you have the ability to pay for your medications: Yes,  Medicaid.  Release of information consent forms completed and in the chart.  Patient to Follow up at: Follow-up Information    [facility] Follow up on 02/26/2019.   Specialty: Professional Counselor Why: Please follow up during walk-in hours for services on Monday, 6/29 at 8:30a.  Be sure to bring your photo ID, insurance card, SSN, current medications and discharge paperwork from this hospitalization.  Contact information: [facility] [place] [id number] [phone]           Next level of care provider has access to [place] and Suicide Prevention discussed: Yes,  with patient. Declined consents.     Has patient been referred to the Quitline?: Patient refused referral  Patient has been referred for addiction treatment: Yes  Joellen Jersey, [place] 02/23/2019, 10:10 AM

## 2019-02-23 NOTE — Progress Notes (Signed)
Patient ID: [name], female   DOB: 1986/03/24, 33 y.o.   MRN: [id number]  Discharge Note  Patient denies SI/HI and states readiness for discharge.  Written and verbal discharge instructions reviewed with the patient. Patient accepting to information and verbalized understanding with no concerns. All belongings returned to patient from the unit and secured lockers. Patient has completed their Suicide Safety Plan and has been provided Suicide Prevention resources. Patient provided an opportunity to complete and return Patient Satisfaction Survey.   Patient was safely escorted to the lobby for discharge. Patient discharged from [facility] prescriptions, personal belongings, follow-up appointment in place and discharge paperwork.

## 2019-04-12 ENCOUNTER — Other Ambulatory Visit: Payer: Self-pay

## 2019-04-12 ENCOUNTER — Encounter (HOSPITAL_COMMUNITY): Payer: Self-pay

## 2019-04-12 ENCOUNTER — Ambulatory Visit (INDEPENDENT_AMBULATORY_CARE_PROVIDER_SITE_OTHER): Payer: Medicaid Other

## 2019-04-12 ENCOUNTER — Ambulatory Visit (HOSPITAL_COMMUNITY)
Admission: EM | Admit: 2019-04-12 | Discharge: 2019-04-12 | Disposition: A | Discharge status: Home / Self Care | Payer: Medicaid Other | Attending: Family Medicine | Admitting: Family Medicine

## 2019-04-12 DIAGNOSIS — R1084 Generalized abdominal pain: Secondary | ICD-10-CM

## 2019-04-12 DIAGNOSIS — F319 Bipolar disorder, unspecified: Secondary | ICD-10-CM | POA: Diagnosis not present

## 2019-04-12 DIAGNOSIS — Z79899 Other long term (current) drug therapy: Secondary | ICD-10-CM | POA: Diagnosis not present

## 2019-04-12 DIAGNOSIS — Z9049 Acquired absence of other specified parts of digestive tract: Secondary | ICD-10-CM | POA: Diagnosis not present

## 2019-04-12 DIAGNOSIS — Z20828 Contact with and (suspected) exposure to other viral communicable diseases: Secondary | ICD-10-CM | POA: Insufficient documentation

## 2019-04-12 DIAGNOSIS — K5900 Constipation, unspecified: Secondary | ICD-10-CM | POA: Diagnosis not present

## 2019-04-12 DIAGNOSIS — Z87891 Personal history of nicotine dependence: Secondary | ICD-10-CM | POA: Diagnosis not present

## 2019-04-12 DIAGNOSIS — Z886 Allergy status to analgesic agent status: Secondary | ICD-10-CM | POA: Diagnosis not present

## 2019-04-12 DIAGNOSIS — R112 Nausea with vomiting, unspecified: Secondary | ICD-10-CM | POA: Insufficient documentation

## 2019-04-12 DIAGNOSIS — J45909 Unspecified asthma, uncomplicated: Secondary | ICD-10-CM | POA: Insufficient documentation

## 2019-04-12 LAB — POCT URINALYSIS DIP (DEVICE)
Bilirubin Urine: NEGATIVE
Glucose, UA: NEGATIVE mg/dL
Ketones, ur: NEGATIVE mg/dL
Leukocytes,Ua: NEGATIVE
Nitrite: NEGATIVE
Protein, ur: NEGATIVE mg/dL
Specific Gravity, Urine: 1.025 (ref 1.005–1.030)
Urobilinogen, UA: 0.2 mg/dL (ref 0.0–1.0)
pH: 6.5 (ref 5.0–8.0)

## 2019-04-12 LAB — CBC
HCT: 42.6 % (ref 36.0–46.0)
Hemoglobin: 15.1 g/dL — ABNORMAL HIGH (ref 12.0–15.0)
MCH: 33 pg (ref 26.0–34.0)
MCHC: 35.4 g/dL (ref 30.0–36.0)
MCV: 93.2 fL (ref 80.0–100.0)
Platelets: 190 10*3/uL (ref 150–400)
RBC: 4.57 MIL/uL (ref 3.87–5.11)
RDW: 12.9 % (ref 11.5–15.5)
WBC: 6.5 10*3/uL (ref 4.0–10.5)
nRBC: 0 % (ref 0.0–0.2)

## 2019-04-12 MED ORDER — HYDROCODONE-ACETAMINOPHEN 7.5-325 MG PO TABS: 1.0000 | ORAL_TABLET | Freq: Four times a day (QID) | ORAL | 0 refills | Status: DC | PRN

## 2019-04-12 MED ORDER — POLYETHYLENE GLYCOL 3350 17 GM/SCOOP PO POWD: 17.0000 g | Freq: Every day | ORAL | 0 refills | Status: DC

## 2019-04-12 MED ORDER — KETOROLAC TROMETHAMINE 60 MG/2ML IM SOLN
INTRAMUSCULAR | Status: AC
  Filled 2019-04-12: qty 2

## 2019-04-12 MED ORDER — KETOROLAC TROMETHAMINE 60 MG/2ML IM SOLN
60.0000 mg | Freq: Once | INTRAMUSCULAR | Status: AC
  Administered 2019-04-12: 60 mg via INTRAMUSCULAR

## 2019-04-12 NOTE — Discharge Instructions (Signed)
Continue to drink plenty of fluids.  Try to increase your fluids. Take MiraLAX tonight.  If not successful in producing a bowel movement, take another dose tomorrow.  Take twice a day until you are successful then reduce to once a day Take the pain medicine as needed.  Make sure you continue taking MiraLAX as long as you are on pain medicine If your pain becomes severe, you have vomiting, you have fever, you may need to go to the emergency room

## 2019-04-12 NOTE — ED Provider Notes (Signed)
[facility]    CSN: [id number] Arrival date & time: 04/12/19  1403      History   Chief Complaint Chief Complaint  Patient presents with  . Abdominal Pain  . Diarrhea    HPI [name] is a 33 y.o. female.   HPI  Patient is here for abdominal pain.  She was seen for abdominal pain in the emergency room on 02/17/2019.  This visit, and all the results are reviewed.  Patient states that this pain is "worse".  She has been having pain for little over a week.  Initially had vomiting.  This stopped 5 days ago.  She still has nausea, decreased appetite.  She states she tried to eat a chicken sandwich today and was unable to take more than a couple bites.  She is drinking Sprite.  She is drinking water.  She is urinating normally.  She has abdominal pain that is present all of the time.  She states is keeping her awake at night.  She states it hurts when she eats, when she does not, when she drinks, when she does not, and in any position.  She has had no fever or chills.  No cough or respiratory symptoms.  She states that she just has small amounts of loose or watery diarrhea.  No blood in the bowels.  She has been having bowel movements 2-3 times a day.  She feels like she needs to go, and then just has a small amount of watery stool.  Does not recall her last normal BM. Has had some cholecystectomy.  Has had a right ovary removed.  No other abdominal surgeries except for tubal ligation. Denies any ongoing abdominal problems such as IBS, ulcers  Past Medical History:  Diagnosis Date  . Anxiety   . Asthma   . Bipolar 1 disorder (HCC)   . Depression   . Endometriosis   . Mental disorder     Patient Active Problem List   Diagnosis Date Noted  . Bipolar 1 disorder, depressed, severe (HCC) 02/22/2019  . Severe recurrent major depression with psychotic features (HCC) 07/23/2017  . Substance use disorder 07/23/2017  . Pelvic pain in female 01/15/2015  . MDD (major  depressive disorder), recurrent episode, severe (HCC) 03/11/2014  . MDD (major depressive disorder) 03/11/2014  . Bipolar disorder with depression (HCC) 09/14/2013  . Major depressive disorder, recurrent episode, severe (HCC) 12/30/2011    Class: Stage 3  . Bipolar affective disorder, depressed, severe (HCC) 12/30/2011    Class: Acute    Past Surgical History:  Procedure Laterality Date  . ABDOMINAL SURGERY     "For endometriosis"  . CHOLECYSTECTOMY    . OOPHORECTOMY Right 2012  . TUBAL LIGATION      OB History    Gravida  4   Para  2   Term  2   Preterm  0   AB  2   Living  3     SAB  2   TAB  0   Ectopic  0   Multiple  0   Live Births  1            Home Medications    Prior to Admission medications   Medication Sig Start Date End Date Taking? Authorizing Provider  albuterol (PROVENTIL HFA;VENTOLIN HFA) 108 (90 BASE) MCG/ACT inhaler Inhale 1-2 puffs into the lungs every 6 (six) hours as needed for wheezing or shortness of breath.     [provider]  Brexpiprazole (REXULTI) 1 MG TABS Take 1 tablet (1 mg total) by mouth 2 (two) times a day. 02/23/19   Malvin JohnsFarah, Brian, MD  CALCIUM-VITAMIN D PO Take 1 tablet by mouth daily.    [provider]  ciprofloxacin (CIPRO) 500 MG tablet Take 1 tablet (500 mg total) by mouth 2 (two) times daily. 02/17/19   Petrucelli, Samantha R, PA-C  gabapentin (NEURONTIN) 300 MG capsule Take 1 capsule (300 mg total) by mouth 3 (three) times daily. 02/23/19   Malvin JohnsFarah, Brian, MD  HYDROcodone-acetaminophen (NORCO) 7.5-325 MG tablet Take 1 tablet by mouth every 6 (six) hours as needed for moderate pain. 04/12/19   Eustace MooreNelson, Toriana Sponsel Sue, MD  lamoTRIgine (LAMICTAL) 25 MG tablet Take 2 tablets (50 mg total) by mouth 2 (two) times daily. 02/23/19   Malvin JohnsFarah, Brian, MD  metroNIDAZOLE (FLAGYL) 500 MG tablet Take 1 tablet (500 mg total) by mouth 3 (three) times daily. 02/17/19   Petrucelli, Samantha R, PA-C  ondansetron (ZOFRAN ODT) 4 MG  disintegrating tablet Take 1 tablet (4 mg total) by mouth every 8 (eight) hours as needed for nausea or vomiting. Patient not taking: Reported on 02/21/2019 02/17/19   Petrucelli, Lelon MastSamantha R, PA-C  polyethylene glycol powder (GLYCOLAX/MIRALAX) 17 GM/SCOOP powder Take 17 g by mouth daily. 04/12/19   Eustace MooreNelson, Aldan Camey Sue, MD  sucralfate (CARAFATE) 1 g tablet Take 1 g by mouth 4 (four) times daily as needed (stomach pain).  10/26/18   [provider]  valACYclovir (VALTREX) 500 MG tablet Take 500 mg by mouth daily.    [provider]  risperiDONE (RISPERDAL) 1 MG tablet Take 1 tablet (1 mg total) by mouth at bedtime. For mood control Patient not taking: Reported on 03/28/2018 07/26/17 02/17/19  Money, Gerlene Burdockravis B, FNP  traZODone (DESYREL) 50 MG tablet Take 1 tablet (50 mg total) by mouth at bedtime as needed for sleep. Patient not taking: Reported on 03/28/2018 07/26/17 02/17/19  Money, Gerlene Burdockravis B, FNP    Family History Family History  Problem Relation Age of Onset  . Stroke Mother   . Hyperlipidemia Other     Social History Social History   Tobacco Use  . Smoking status: Former Games developermoker  . Smokeless tobacco: Never Used  Substance Use Topics  . Alcohol use: Not Currently    Comment: Drinks once a month  . Drug use: Yes    Types: Marijuana, Barbituates    Comment: daily     Allergies   Bee venom, Coconut flavor, Ibuprofen, and Naproxen   Review of Systems Review of Systems  Constitutional: Positive for appetite change. Negative for chills, fatigue, fever and unexpected weight change.  HENT: Negative for ear pain and sore throat.   Eyes: Negative for pain and visual disturbance.  Respiratory: Negative for cough and shortness of breath.   Cardiovascular: Negative for chest pain and palpitations.  Gastrointestinal: Positive for abdominal distention, abdominal pain and diarrhea. Negative for blood in stool and vomiting.  Genitourinary: Negative for dysuria and hematuria.   Musculoskeletal: Negative for arthralgias and back pain.  Skin: Negative for color change and rash.  Neurological: Negative for seizures and syncope.  Psychiatric/Behavioral: Positive for sleep disturbance.  All other systems reviewed and are negative.    Physical Exam Triage Vital Signs ED Triage Vitals  Enc Vitals Group     BP 04/12/19 1416 120/86     Pulse Rate 04/12/19 1416 74     Resp 04/12/19 1416 18     Temp 04/12/19 1416 98.4  F (36.9 C)     Temp Source 04/12/19 1416 Oral     SpO2 04/12/19 1416 99 %     Weight 04/12/19 1415 180 lb (81.6 kg)     Height --      Head Circumference --      Peak Flow --      Pain Score 04/12/19 1415 10     Pain Loc --      Pain Edu? --      Excl. in GC? --    No data found.  Updated Vital Signs BP 120/86 (BP Location: Right Arm)   Pulse 74   Temp 98.4 F (36.9 C) (Oral)   Resp 18   Wt 81.6 kg   LMP 03/24/2019   SpO2 99%   BMI 32.92 kg/m      Physical Exam Constitutional:      General: She is not in acute distress.    Appearance: She is well-developed. She is obese. She is ill-appearing.     Comments: Patient is acutely upset.  Tearful.  Morning.  Rolling back and forth on the table.  HENT:     Head: Normocephalic and atraumatic.     Mouth/Throat:     Mouth: Mucous membranes are moist.  Eyes:     Conjunctiva/sclera: Conjunctivae normal.     Pupils: Pupils are equal, round, and reactive to light.  Neck:     Musculoskeletal: Normal range of motion.  Cardiovascular:     Rate and Rhythm: Normal rate and regular rhythm.     Heart sounds: Normal heart sounds.  Pulmonary:     Effort: Pulmonary effort is normal. No respiratory distress.     Breath sounds: Normal breath sounds.  Abdominal:     General: Abdomen is protuberant. Bowel sounds are decreased. There is no distension.     Palpations: Abdomen is soft. There is no hepatomegaly, splenomegaly or mass.     Tenderness: There is generalized abdominal tenderness. There is  no right CVA tenderness, left CVA tenderness, guarding or rebound. Negative signs include Murphy's sign and Rovsing's sign.     Hernia: No hernia is present.  Musculoskeletal: Normal range of motion.  Skin:    General: Skin is warm and dry.  Neurological:     General: No focal deficit present.     Mental Status: She is alert.  Psychiatric:        Mood and Affect: Mood is anxious.      UC Treatments / Results  Labs (all labs ordered are listed, but only abnormal results are displayed) Labs Reviewed  CBC - Abnormal; Notable for the following components:      Result Value   Hemoglobin 15.1 (*)    All other components within normal limits  POCT URINALYSIS DIP (DEVICE) - Abnormal; Notable for the following components:   Hgb urine dipstick TRACE (*)    All other components within normal limits  NOVEL CORONAVIRUS, NAA (HOSPITAL ORDER, SEND-OUT TO REF LAB)    EKG   Radiology Dg Abd Acute W/chest  Result Date: 04/12/2019 CLINICAL DATA:  Abdominal pain EXAM: DG ABDOMEN ACUTE W/ 1V CHEST COMPARISON:  Abdominal radiographs Jan 26, 2015 FINDINGS: PA chest: Lungs are clear. Heart size and pulmonary vascularity are normal. No adenopathy. Supine and upright abdomen: There is no bowel dilatation or air-fluid level to suggest bowel obstruction. No free air. There is moderate stool in the colon. There are probable phleboliths in the pelvis. There are surgical clips in the  gallbladder fossa region. IMPRESSION: No evident bowel obstruction or free air. Moderate stool in colon. Lungs clear. Electronically Signed   By: Lowella Grip III M.D.   On: 04/12/2019 16:00    Procedures Procedures (including critical care time)  Medications Ordered in UC Medications  ketorolac (TORADOL) injection 60 mg (60 mg Intramuscular Given 04/12/19 1519)  ketorolac (TORADOL) 60 MG/2ML injection (has no administration in time range)    Initial Impression / Assessment and Plan / UC Course  I have reviewed the  triage vital signs and the nursing notes.  Pertinent labs & imaging results that were available during my care of the patient were reviewed by me and considered in my medical decision making (see chart for details).     I explained to the patient I believe she has constipation with overflow diarrhea.  This appears to be the major portion of her problem at this time.  We talked about laxatives.  Talked about enemas.  Talked about the importance of trying to move her bowels.  Increase fluids.  This can be managed at home.  Only if she has severe increasing pain, fever, or vomiting which she go to the emergency room.  White count is normal.  Urinalysis is normal.  X-rays are reviewed. Final Clinical Impressions(s) / UC Diagnoses   Final diagnoses:  Constipation, unspecified constipation type  Generalized abdominal pain     Discharge Instructions     Continue to drink plenty of fluids.  Try to increase your fluids. Take MiraLAX tonight.  If not successful in producing a bowel movement, take another dose tomorrow.  Take twice a day until you are successful then reduce to once a day Take the pain medicine as needed.  Make sure you continue taking MiraLAX as long as you are on pain medicine If your pain becomes severe, you have vomiting, you have fever, you may need to go to the emergency room   ED Prescriptions    Medication Sig Dispense Auth. Provider   polyethylene glycol powder (GLYCOLAX/MIRALAX) 17 GM/SCOOP powder Take 17 g by mouth daily. 255 g Raylene Everts, MD   HYDROcodone-acetaminophen [facility]) 7.5-325 MG tablet Take 1 tablet by mouth every 6 (six) hours as needed for moderate pain. 6 tablet Raylene Everts, MD     Controlled Substance Prescriptions [place] Controlled Substance Registry consulted? Yes, I have consulted the [place] Controlled Substances Registry for this patient, and feel the risk/benefit ratio today is favorable for proceeding with this prescription for a controlled  substance.   Raylene Everts, MD 04/12/19 [phone]

## 2019-04-12 NOTE — ED Triage Notes (Signed)
Pt cc diarrhea, nausea, and abdominal pain x 10 days. Pt states she has been vomiting.

## 2019-04-13 LAB — NOVEL CORONAVIRUS, NAA (HOSP ORDER, SEND-OUT TO REF LAB; TAT 18-24 HRS): SARS-CoV-2, NAA: NOT DETECTED

## 2019-05-17 ENCOUNTER — Encounter: Payer: Self-pay | Admitting: Gastroenterology

## 2019-05-18 ENCOUNTER — Encounter: Payer: Self-pay | Admitting: Gastroenterology

## 2019-05-28 ENCOUNTER — Encounter (HOSPITAL_COMMUNITY): Payer: Self-pay

## 2019-05-28 ENCOUNTER — Emergency Department (HOSPITAL_COMMUNITY): Payer: Medicaid Other

## 2019-05-28 ENCOUNTER — Emergency Department (HOSPITAL_COMMUNITY)
Admission: EM | Admit: 2019-05-28 | Discharge: 2019-05-29 | Disposition: A | Discharge status: Home / Self Care | Payer: Medicaid Other | Attending: Emergency Medicine | Admitting: Emergency Medicine

## 2019-05-28 ENCOUNTER — Other Ambulatory Visit: Payer: Self-pay

## 2019-05-28 DIAGNOSIS — M5416 Radiculopathy, lumbar region: Secondary | ICD-10-CM | POA: Diagnosis not present

## 2019-05-28 DIAGNOSIS — R0602 Shortness of breath: Secondary | ICD-10-CM | POA: Insufficient documentation

## 2019-05-28 DIAGNOSIS — Z79899 Other long term (current) drug therapy: Secondary | ICD-10-CM | POA: Diagnosis not present

## 2019-05-28 DIAGNOSIS — J4541 Moderate persistent asthma with (acute) exacerbation: Secondary | ICD-10-CM | POA: Diagnosis not present

## 2019-05-28 DIAGNOSIS — Z87891 Personal history of nicotine dependence: Secondary | ICD-10-CM | POA: Diagnosis not present

## 2019-05-28 DIAGNOSIS — M7989 Other specified soft tissue disorders: Secondary | ICD-10-CM | POA: Diagnosis present

## 2019-05-28 LAB — BASIC METABOLIC PANEL
Anion gap: 10 (ref 5–15)
BUN: 7 mg/dL (ref 6–20)
CO2: 22 mmol/L (ref 22–32)
Calcium: 8.5 mg/dL — ABNORMAL LOW (ref 8.9–10.3)
Chloride: 106 mmol/L (ref 98–111)
Creatinine, Ser: 0.84 mg/dL (ref 0.44–1.00)
GFR calc Af Amer: >60 mL/min (ref >60)
GFR calc non Af Amer: >60 mL/min (ref >60)
Glucose, Bld: 108 mg/dL — ABNORMAL HIGH (ref 70–99)
Potassium: 3.7 mmol/L (ref 3.5–5.1)
Sodium: 138 mmol/L (ref 135–145)

## 2019-05-28 LAB — CBC
HCT: 40.7 % (ref 36.0–46.0)
Hemoglobin: 14.3 g/dL (ref 12.0–15.0)
MCH: 33.7 pg (ref 26.0–34.0)
MCHC: 35.1 g/dL (ref 30.0–36.0)
MCV: 96 fL (ref 80.0–100.0)
Platelets: 200 10*3/uL (ref 150–400)
RBC: 4.24 MIL/uL (ref 3.87–5.11)
RDW: 13.2 % (ref 11.5–15.5)
WBC: 6.2 10*3/uL (ref 4.0–10.5)
nRBC: 0 % (ref 0.0–0.2)

## 2019-05-28 LAB — I-STAT BETA HCG BLOOD, ED (MC, WL, AP ONLY): I-stat hCG, quantitative: <5 m[IU]/mL (ref <5)

## 2019-05-28 MED ORDER — SODIUM CHLORIDE 0.9% FLUSH: 3.0000 mL | Freq: Once | INTRAVENOUS | Status: DC

## 2019-05-28 NOTE — ED Notes (Signed)
Pt stating her chest pain was getting worse, this RN offered tylenol but pt refused.

## 2019-05-28 NOTE — ED Triage Notes (Signed)
Pt reports SOB and leg swelling "from my hips down to my toes" with pain. Pt has hx of asthma, no relief with inhalers. No obvious swelling noted, pt sob on exertion but resp e/u at rest.

## 2019-05-29 ENCOUNTER — Emergency Department (HOSPITAL_COMMUNITY): Payer: Medicaid Other

## 2019-05-29 ENCOUNTER — Encounter (HOSPITAL_COMMUNITY): Payer: Self-pay | Admitting: Radiology

## 2019-05-29 LAB — TROPONIN I (HIGH SENSITIVITY): Troponin I (High Sensitivity): 9 ng/L (ref <18)

## 2019-05-29 LAB — D-DIMER, QUANTITATIVE: D-Dimer, Quant: 0.75 ug/mL-FEU — ABNORMAL HIGH (ref 0.00–0.50)

## 2019-05-29 LAB — BRAIN NATRIURETIC PEPTIDE: B Natriuretic Peptide: 13 pg/mL (ref 0.0–100.0)

## 2019-05-29 MED ORDER — DEXAMETHASONE SODIUM PHOSPHATE 10 MG/ML IJ SOLN
10.0000 mg | Freq: Once | INTRAMUSCULAR | Status: AC
  Administered 2019-05-29: 02:00:00 10 mg via INTRAVENOUS
  Filled 2019-05-29: qty 1

## 2019-05-29 MED ORDER — MORPHINE SULFATE (PF) 4 MG/ML IV SOLN
4.0000 mg | Freq: Once | INTRAVENOUS | Status: AC
  Administered 2019-05-29: 04:00:00 4 mg via INTRAVENOUS
  Filled 2019-05-29: qty 1

## 2019-05-29 MED ORDER — ONDANSETRON HCL 4 MG/2ML IJ SOLN
4.0000 mg | Freq: Once | INTRAMUSCULAR | Status: AC
  Administered 2019-05-29: 4 mg via INTRAVENOUS
  Filled 2019-05-29: qty 2

## 2019-05-29 MED ORDER — IOHEXOL 350 MG/ML SOLN
75.0000 mL | Freq: Once | INTRAVENOUS | Status: AC | PRN
  Administered 2019-05-29: 03:00:00 75 mL via INTRAVENOUS

## 2019-05-29 MED ORDER — MORPHINE SULFATE (PF) 4 MG/ML IV SOLN
4.0000 mg | Freq: Once | INTRAVENOUS | Status: AC
  Administered 2019-05-29: 02:00:00 4 mg via INTRAVENOUS
  Filled 2019-05-29: qty 1

## 2019-05-29 MED ORDER — PREDNISONE 20 MG PO TABS: ORAL_TABLET | ORAL | 0 refills | Status: DC

## 2019-05-29 MED ORDER — HYDROCODONE-ACETAMINOPHEN 5-325 MG PO TABS: 1.0000 | ORAL_TABLET | ORAL | 0 refills | Status: DC | PRN

## 2019-05-29 NOTE — ED Provider Notes (Signed)
[facility] EMERGENCY DEPARTMENT Provider Note   CSN: [id number] Arrival date & time: 05/28/19  1826     History   Chief Complaint Chief Complaint  Patient presents with   Shortness of Breath   Leg Swelling    HPI [name] is a 33 y.o. female.     Patient presents to the emergency department with multiple complaints.  Patient reports that she has been experiencing pain and swelling of both of her legs.  She is experiencing pain in her lower back that worsens when she moves her legs.  She has not had any loss of strength or sensation of the lower extremities.  No change in bowel or bladder function.  She has not injured her back.  Patient also has been noticing that she is short of breath.  Over the last couple of days she has had increased use of her albuterol inhaler without relief.  She has not had any fever or cough.  Patient reports that she has now experiencing pain in the chest as well.     Past Medical History:  Diagnosis Date   Anxiety    Asthma    Bipolar 1 disorder ([place])    Depression    Endometriosis    Mental disorder     Patient Active Problem List   Diagnosis Date Noted   Bipolar 1 disorder, depressed, severe ([place]) 02/22/2019   Severe recurrent major depression with psychotic features ([place]) 07/23/2017   Substance use disorder 07/23/2017   Pelvic pain in female 01/15/2015   MDD (major depressive disorder), recurrent episode, severe ([place]) 03/11/2014   MDD (major depressive disorder) 03/11/2014   Bipolar disorder with depression ([place]) 09/14/2013   Major depressive disorder, recurrent episode, severe ([place]) 12/30/2011    Class: Stage 3   Bipolar affective disorder, depressed, severe ([place]) 12/30/2011    Class: Acute    Past Surgical History:  Procedure Laterality Date   ABDOMINAL SURGERY     "For endometriosis"   CHOLECYSTECTOMY     OOPHORECTOMY Right 2012   TUBAL LIGATION       OB History    Gravida   4   Para  2   Term  2   Preterm  0   AB  2   Living  3     SAB  2   TAB  0   Ectopic  0   Multiple  0   Live Births  1            Home Medications    Prior to Admission medications   Medication Sig Start Date End Date Taking? Authorizing Provider  albuterol (PROVENTIL HFA;VENTOLIN HFA) 108 (90 BASE) MCG/ACT inhaler Inhale 1-2 puffs into the lungs every 6 (six) hours as needed for wheezing or shortness of breath.    Yes [provider]  CALCIUM-VITAMIN D PO Take 1 tablet by mouth daily.   Yes [provider]  clonazePAM (KLONOPIN) 1 MG tablet Take 1 tablet by mouth 3 (three) times daily as needed for anxiety. 05/21/19  Yes [provider]  ondansetron (ZOFRAN-ODT) 8 MG disintegrating tablet Take 8 mg by mouth every 8 (eight) hours as needed for nausea/vomiting. 04/22/19  Yes [provider]  QUEtiapine (SEROQUEL) 200 MG tablet Take 200 mg by mouth at bedtime.   Yes [provider]  [place] 28 0.25-35 MG-MCG tablet Take 1 tablet by mouth daily. 03/26/19  Yes [provider]  sucralfate (CARAFATE) 1 g tablet  Take 1 g by mouth 4 (four) times daily.  10/26/18  Yes [provider]  valACYclovir (VALTREX) 500 MG tablet Take 500 mg by mouth daily as needed (outbreak).    Yes [provider]  HYDROcodone-acetaminophen (NORCO/VICODIN) 5-325 MG tablet Take 1 tablet by mouth every 4 (four) hours as needed for moderate pain. 05/29/19   Gilda Crease, MD  lamoTRIgine (LAMICTAL) 25 MG tablet Take 2 tablets (50 mg total) by mouth 2 (two) times daily. Patient not taking: Reported on 05/29/2019 02/23/19   Malvin Johns, MD  predniSONE (DELTASONE) 20 MG tablet 3 tabs po daily x 3 days, then 2 tabs x 3 days, then 1.5 tabs x 3 days, then 1 tab x 3 days, then 0.5 tabs x 3 days 05/29/19   Gilda Crease, MD  gabapentin (NEURONTIN) 300 MG capsule Take 1 capsule (300 mg total) by mouth 3 (three) times  daily. Patient not taking: Reported on 05/29/2019 02/23/19 05/29/19  Malvin Johns, MD  risperiDONE (RISPERDAL) 1 MG tablet Take 1 tablet (1 mg total) by mouth at bedtime. For mood control Patient not taking: Reported on 03/28/2018 07/26/17 02/17/19  Money, Gerlene Burdock, FNP  traZODone (DESYREL) 50 MG tablet Take 1 tablet (50 mg total) by mouth at bedtime as needed for sleep. Patient not taking: Reported on 03/28/2018 07/26/17 02/17/19  Money, Gerlene Burdock, FNP    Family History Family History  Problem Relation Age of Onset   Stroke Mother    Hyperlipidemia Other     Social History Social History   Tobacco Use   Smoking status: Former Smoker   Smokeless tobacco: Never Used  Substance Use Topics   Alcohol use: Not Currently    Comment: Drinks once a month   Drug use: Yes    Types: Marijuana, Barbituates    Comment: daily     Allergies   Bee venom, Coconut flavor, Ibuprofen, and Naproxen   Review of Systems Review of Systems  Respiratory: Positive for shortness of breath.   Cardiovascular: Positive for chest pain.  Musculoskeletal: Positive for back pain.  All other systems reviewed and are negative.    Physical Exam Updated Vital Signs BP 120/75    Pulse 70    Temp 98.2 F (36.8 C) (Oral)    Resp 18    LMP 05/14/2019    SpO2 100%   Physical Exam Vitals signs and nursing note reviewed.  Constitutional:      General: She is not in acute distress.    Appearance: Normal appearance. She is well-developed.  HENT:     Head: Normocephalic and atraumatic.     Right Ear: Hearing normal.     Left Ear: Hearing normal.     Nose: Nose normal.  Eyes:     Conjunctiva/sclera: Conjunctivae normal.     Pupils: Pupils are equal, round, and reactive to light.  Neck:     Musculoskeletal: Normal range of motion and neck supple.  Cardiovascular:     Rate and Rhythm: Regular rhythm.     Heart sounds: S1 normal and S2 normal. No murmur. No friction rub. No gallop.   Pulmonary:      Effort: Pulmonary effort is normal. No respiratory distress.     Breath sounds: Normal breath sounds.  Chest:     Chest wall: No tenderness.  Abdominal:     General: Bowel sounds are normal.     Palpations: Abdomen is soft.     Tenderness: There is no abdominal tenderness. There  is no guarding or rebound. Negative signs include Murphy's sign and McBurney's sign.     Hernia: No hernia is present.  Musculoskeletal: Normal range of motion.     Comments: Straight leg raises worsen low back pain bilaterally  Skin:    General: Skin is warm and dry.     Findings: No rash.  Neurological:     Mental Status: She is alert and oriented to person, place, and time.     GCS: GCS eye subscore is 4. GCS verbal subscore is 5. GCS motor subscore is 6.     Cranial Nerves: No cranial nerve deficit.     Sensory: No sensory deficit.     Coordination: Coordination normal.     Comments: Bilateral lower extremity sensation is normal and she has normal strength bilaterally  Psychiatric:        Speech: Speech normal.        Behavior: Behavior normal.        Thought Content: Thought content normal.      ED Treatments / Results  Labs (all labs ordered are listed, but only abnormal results are displayed) Labs Reviewed  BASIC METABOLIC PANEL - Abnormal; Notable for the following components:      Result Value   Glucose, Bld 108 (*)    Calcium 8.5 (*)    All other components within normal limits  D-DIMER, QUANTITATIVE (NOT AT [facility]) - Abnormal; Notable for the following components:   D-Dimer, Quant 0.75 (*)    All other components within normal limits  CBC  BRAIN NATRIURETIC PEPTIDE  I-STAT BETA HCG BLOOD, ED ([facility], [facility], [facility] ONLY)  TROPONIN I (HIGH SENSITIVITY)    EKG EKG Interpretation  Date/Time:  Monday May 28 2019 19:08:51 EDT Ventricular Rate:  91 PR Interval:  126 QRS Duration: 80 QT Interval:  370 QTC Calculation: 455 R Axis:   -3 Text Interpretation:  Normal sinus rhythm Minimal  voltage criteria for LVH, may be normal variant Borderline ECG Otherwise no significant change Confirmed by Drema Pryardama, Pedro [id number]) on 05/29/2019 12:00:34 AM   Radiology Dg Chest 2 View  Result Date: 05/28/2019 CLINICAL DATA:  Shortness of breath and swelling. EXAM: CHEST - 2 VIEW COMPARISON:  February 22, 2016 FINDINGS: The heart size and mediastinal contours are within normal limits. Both lungs are clear. The visualized skeletal structures are unremarkable. IMPRESSION: No active cardiopulmonary disease. Electronically Signed   By: Gerome Samavid  Williams III M.D   On: 05/28/2019 19:55   Ct Angio Chest Pe W Or Wo Contrast  Result Date: 05/29/2019 CLINICAL DATA:  Shortness of breath. PE suspected, intermediate prob, positive D-dimer EXAM: CT ANGIOGRAPHY CHEST WITH CONTRAST TECHNIQUE: Multidetector CT imaging of the chest was performed using the standard protocol during bolus administration of intravenous contrast. Multiplanar CT image reconstructions and MIPs were obtained to evaluate the vascular anatomy. CONTRAST:  75mL OMNIPAQUE IOHEXOL 350 MG/ML SOLN COMPARISON:  Chest radiograph yesterday FINDINGS: Cardiovascular: There are no filling defects within the pulmonary arteries to suggest pulmonary embolus. Thoracic aorta is normal in caliber without dissection. Heart is normal in size. No pericardial effusion. Mediastinum/Nodes: Moderate hiatal hernia. No enlarged mediastinal or hilar lymph nodes. No visualized thyroid nodule. Lungs/Pleura: Mild central bronchial thickening. Slight heterogeneous pulmonary parenchyma suggesting small airways disease. No focal airspace disease. No pulmonary edema. No pleural fluid. No pulmonary mass. Upper Abdomen: No acute findings. Calcified granuloma in the spleen. Musculoskeletal: There are no acute or suspicious osseous abnormalities. Review of the MIP images confirms the above  findings. IMPRESSION: 1. No pulmonary embolus. 2. Mild central bronchial thickening with heterogeneous  pulmonary parenchyma suggesting small airways disease/asthma. 3. Moderate hiatal hernia. Electronically Signed   By: Narda Rutherford M.D.   On: 05/29/2019 04:03    Procedures Procedures (including critical care time)  Medications Ordered in ED Medications  sodium chloride flush (NS) 0.9 % injection 3 mL (3 mLs Intravenous Not Given 05/29/19 0033)  morphine 4 MG/ML injection 4 mg (4 mg Intravenous Given 05/29/19 0143)  ondansetron (ZOFRAN) injection 4 mg (4 mg Intravenous Given 05/29/19 0144)  dexamethasone (DECADRON) injection 10 mg (10 mg Intravenous Given 05/29/19 0143)  iohexol (OMNIPAQUE) 350 MG/ML injection 75 mL (75 mLs Intravenous Contrast Given 05/29/19 0304)  morphine 4 MG/ML injection 4 mg (4 mg Intravenous Given 05/29/19 0346)     Initial Impression / Assessment and Plan / ED Course  I have reviewed the triage vital signs and the nursing notes.  Pertinent labs & imaging results that were available during my care of the patient were reviewed by me and considered in my medical decision making (see chart for details).        Patient presents to the ER with multiple complaints.  Patient reports that she is swollen from her waist down.  Physical examination, however, does not show any edema.  She does not appear to be in congestive heart failure.  Lungs are clear.  Chest x-ray does not show any edema.  BNP is normal.  Vital signs are entirely normal.  Patient has palpable dorsalis pedal pulses, normal skin, no evidence of infection or other abnormality of the lower extremities.  She does not have isolated calf swelling or tenderness.  D-dimer was mildly elevated but I do not suspect DVT.  She is, however, complaining of chest pain and shortness of breath.  This is likely secondary to her asthma.  A CT scan of her chest was performed to rule out PE and no PE was noted.  She does have sequela of asthma but no pneumonia or other abnormality.  Patient complaining of low back pain.  I  believe that this is what is causing some of her leg symptoms.  She appears to have a mild radiculopathy but has normal strength and sensation, no footdrop, no saddle anesthesia or other red flags.  Patient treated with morphine here in the ER with improvement of her pain.  She was given Decadron for her lungs as well as radiculopathy and will continue prednisone and analgesia as an outpatient, follow-up with PCP.  Final Clinical Impressions(s) / ED Diagnoses   Final diagnoses:  Moderate persistent asthma with acute exacerbation  Lumbar radiculopathy    ED Discharge Orders         Ordered    predniSONE (DELTASONE) 20 MG tablet     05/29/19 0424    HYDROcodone-acetaminophen (NORCO/VICODIN) 5-325 MG tablet  Every 4 hours PRN     05/29/19 0424           Gilda Crease, MD 05/29/19 [phone]

## 2019-05-29 NOTE — ED Notes (Signed)
Dr. Betsey Holiday ( EDP) explained tests results /plan of care and discharge to patient , nurse explained discharge instructions/prescriptions and follow up care .

## 2019-05-29 NOTE — ED Notes (Signed)
Pt ambulatory to restroom with steady gait; water given

## 2019-06-18 ENCOUNTER — Ambulatory Visit: Payer: Medicaid Other | Admitting: Gastroenterology

## 2019-06-18 ENCOUNTER — Other Ambulatory Visit: Payer: Medicaid Other

## 2019-06-18 ENCOUNTER — Encounter: Payer: Self-pay | Admitting: Gastroenterology

## 2019-06-18 VITALS — BP 112/76 | HR 115 | Temp 97.8°F | Ht 62.0 in | Wt 191.0 lb

## 2019-06-18 DIAGNOSIS — R140 Abdominal distension (gaseous): Secondary | ICD-10-CM | POA: Diagnosis not present

## 2019-06-18 DIAGNOSIS — R1084 Generalized abdominal pain: Secondary | ICD-10-CM

## 2019-06-18 MED ORDER — PANTOPRAZOLE SODIUM 40 MG PO TBEC: 40.0000 mg | DELAYED_RELEASE_TABLET | Freq: Every day | ORAL | 3 refills | Status: DC

## 2019-06-18 NOTE — Progress Notes (Signed)
Referring Provider: Tracey HarriesBouska, David, MD Primary Care Physician:  [facility]  Reason for Consultation:  Abdominal pain   IMPRESSION:  Epigastric abdominal pain Bloating Heartburn Nausea and vomiting Hiatal hernia on multiple imaging study Abnormal abdominal CT suggesting colitis versus under distended colon 01/2019 Stool burden on abdominal x-rays 03/2019  The differential for upper abdominal pain with nausea, vomiting, bloating, and heartburn without alarm features includes esophagitis, functional dyspepsia, gastroparesis, H. pylori, gastritis, duodenitis, functional abdominal pain, and bacterial overgrowth.  Recent abdominal imaging and labs exclude hepatic or pancreatic etiologies.  There is no suggestion of symptomatic gallstones.  Rare causes of abdominal pain include heavy metal poisoning, acute intermittent porphyria, abdominal migraine, abdominal epilepsy, benign tumors, and malignancies.  PLAN: H pylori stool antigen UGI series Continue Carafate QID Pantoprazole 40 mg taken 30 minutes prior to breakfast If no response, trial of FDGard Return to this clinic in 8 weeks Consider endoscopic evaluation with the development of any alarm features or nonresponse to medical therapy  Please see the "Patient Instructions" section for addition details about the plan.  HPI: [name] is a 33 y.o. female on disability.  Referred by PA Tinnie GensJeffrey with [facility] for epigastric abdominal pain and vomiting with nausea..  The history is obtained to the patient, review of medical records from [facility], and review of her available electronic health record.  Previously worked at a [employer]. She has anxiety, asthma, depression, bipolar affective disorder, chronic low back pain, and endometriosis.    Reports abdominal problems following her cholecystectomy 9 years ago that was performed during pregnancy.    Heartburn and epigastric burning developed 5 months after her cholecystectomy.  Symptoms are intermittent and occasionally associated with non-radiating RUQ cramping and aching. Localized to the area where her gallbladder used to be. Associated bloating, distension, nausea and vomiting. Symptoms are similar to those prior to her cholecystectomy.  No change in symptoms with eating, defecation, or movement. Spicy foods, pizza, spaghetti, and lasagna seem to trigger her symptoms.   Occasional diarrhea. No constipation.  Poor appetite. Unintentional weight gain.   She cut back on smoking using a water vapor device and was able to eliminate caffeine and carbonated beverages.  She denies use of any NSAIDs.  She has been using sucralfate QID and prilosec PRN without significant improvement. She has never been on Prilosec daily before.  Ranitidine provided relief of her heartburn.  No benefit from Tums.  Ondansetron provided only temporary benefit.  Has not tried over any other medications including any OTC treatments.  A CT performed during an ER evaluation 02/17/2019 showed mild diffuse circumferential colonic wall thickening that could be due to under distention throughout the colon.  No source for her abdominal pain identified.  She was treated with Cipro, Flagyl, ondansetron, and oxycodone.  She was subsequently seen in the urgent care 04/12/2019 for constipation, nausea, and vomiting.  Recent studies include a normal CBC, negative C. difficile stool study, negative stool culture for E. coli, Campylobacter, Shigella, and Salmonella.  Ova and parasite gait screen was negative.  Labs from 05/17/2019 show a normal comprehensive metabolic panel except for a creatinine of 0.55.  Hep C antibody virus was negative, hep B surface antigen negative, hepatitis A IgM negative, HIV nonreactive.  She has established care with a new doctor who recommended that she come here.    She is concerned that she could have a  hernia.  Grandmother told her  she needs to be tested for a hernia.   There is no known family history of gastrointestinal disease.  No known family history of colon cancer or polyps. No family history of uterine/endometrial cancer, pancreatic cancer or gastric/stomach cancer.  I have reviewed the following prior abdominal imaging: Intraoperative cholangiogram 04/17/2019: Normal CT of the abdomen and pelvis with contrast 3/41/9622: Tiny umbilical hernia containing fat, post right oophorectomy, no acute abnormality CT of the abdomen and pelvis with contrast 04/29/2015: Abnormality CT renal stone study 05/03/2017: Small hiatal hernia, mild hydronephrosis otherwise normal CT of the abdomen and pelvis with contrast 02/17/2019: Mild apparent diffuse circumferential colonic wall thickening potentially accentuated due to under distention otherwise no explanation for the patient's severe right upper quadrant abdominal pain, moderately sized hiatal hernia, prior cholecystectomy Abdominal x-ray 04/12/2019: Moderate stool in the colon.  No bowel obstruction. CT of the angio chest PE with or without contrast 05/29/2019: No PE, small airway disease/asthma, moderate hiatal hernia  Past Medical History:  Diagnosis Date  . Anxiety   . Asthma   . Bipolar 1 disorder ([place])   . Depression   . Endometriosis   . Mental disorder     Past Surgical History:  Procedure Laterality Date  . ABDOMINAL SURGERY     "For endometriosis"  . CHOLECYSTECTOMY    . OOPHORECTOMY Right 2012  . TUBAL LIGATION      Current Outpatient Medications  Medication Sig Dispense Refill  . albuterol (PROVENTIL HFA;VENTOLIN HFA) 108 (90 BASE) MCG/ACT inhaler Inhale 1-2 puffs into the lungs every 6 (six) hours as needed for wheezing or shortness of breath.     Marland Kitchen CALCIUM-VITAMIN D PO Take 1 tablet by mouth daily.    . clonazePAM (KLONOPIN) 1 MG tablet Take 1 tablet by mouth 3 (three) times daily as needed for anxiety.    . primidone  (MYSOLINE) 50 MG tablet Take by mouth 4 (four) times daily.    . QUEtiapine (SEROQUEL) 200 MG tablet Take 200 mg by mouth at bedtime.    Marland Kitchen rOPINIRole (REQUIP) 0.25 MG tablet TAKE 1 TAB EVERY PM. MAY INCREASE BY 1 TAB EVERY 5 DAYS, UNTIL SYMPTOMS ARE CONTROLLED OR UP TO 3 MG    . SPRINTEC 28 0.25-35 MG-MCG tablet Take 1 tablet by mouth daily.    . sucralfate (CARAFATE) 1 g tablet Take 1 g by mouth 4 (four) times daily.     . valACYclovir (VALTREX) 500 MG tablet Take 500 mg by mouth daily as needed (outbreak).      No current facility-administered medications for this visit.     Allergies as of 06/18/2019 - Review Complete 06/18/2019  Allergen Reaction Noted  . Bee venom Anaphylaxis and Swelling 01/15/2015  . Coconut flavor Anaphylaxis 09/23/2014  . Ibuprofen Hives 07/22/2017  . Naproxen Nausea And Vomiting 05/03/2017    Family History  Problem Relation Age of Onset  . Stroke Mother   . Hyperlipidemia Other   . Colon cancer Neg Hx   . Stomach cancer Neg Hx   . Pancreatic cancer Neg Hx   . Esophageal cancer Neg Hx     Social History   Socioeconomic History  . Marital status: Legally Separated    Spouse name: Not on file  . Number of children: Not on file  . Years of education: Not on file  . Highest education level: Not on file  Occupational History  . Not on file  Social Needs  . Financial resource strain: Not on file  .  Food insecurity    Worry: Not on file    Inability: Not on file  . Transportation needs    Medical: Not on file    Non-medical: Not on file  Tobacco Use  . Smoking status: Current Every Day Smoker    Types: Cigars  . Smokeless tobacco: Never Used  . Tobacco comment: 1-2 a day  Substance and Sexual Activity  . Alcohol use: Never    Frequency: Never  . Drug use: Yes    Types: Marijuana, Barbituates    Comment: daily  . Sexual activity: Yes    Partners: Male    Birth control/protection: Surgical, Pill  Lifestyle  . Physical activity    Days  per week: Not on file    Minutes per session: Not on file  . Stress: Not on file  Relationships  . Social Musician on phone: Not on file    Gets together: Not on file    Attends religious service: Not on file    Active member of club or organization: Not on file    Attends meetings of clubs or organizations: Not on file    Relationship status: Not on file  . Intimate partner violence    Fear of current or ex partner: Not on file    Emotionally abused: Not on file    Physically abused: Not on file    Forced sexual activity: Not on file  Other Topics Concern  . Not on file  Social History Narrative  . Not on file    Review of Systems: 12 system ROS is negative except as noted above with the additions of allergies, anxiety, back pain, vision changes, depression, headaches, menstrual pains, muscle pains, and insomnia.   Physical Exam: General:   Alert,  well-nourished, pleasant and cooperative in NAD Head:  Normocephalic and atraumatic. Eyes:  Sclera clear, no icterus.   Conjunctiva pink. Ears:  Normal auditory acuity. Nose:  No deformity, discharge,  or lesions. Mouth:  No deformity or lesions.  Poor dentition. Neck:  Supple; no masses or thyromegaly. Lungs:  Clear throughout to auscultation.   No wheezes. Heart:  Regular rate and rhythm; no murmurs. Abdomen:  Soft, nontender, nondistended, normal bowel sounds, no rebound or guarding. No hepatosplenomegaly.   Rectal:  Deferred  Msk:  Symmetrical. No boney deformities LAD: No inguinal or umbilical LAD Extremities:  No clubbing or edema. Neurologic:  Alert and  oriented x4;  grossly nonfocal Skin:  Intact without significant lesions or rashes. Psych:  Alert and cooperative. Normal mood and affect.    Ricki Vanhandel L. Orvan Falconer, MD, MPH 06/18/2019, 2:30 PM

## 2019-06-18 NOTE — Patient Instructions (Signed)
Your provider has requested that you go to the basement level for lab work before leaving today. Press "B" on the elevator. The lab is located at the first door on the left as you exit the elevator.  You have been scheduled for an Upper GI Series at [facility]. Your appointment is on 06-25-2019 at 9:30 am. Please arrive 15 minutes prior to your test for registration. Make sure not to eat or drink anything after midnight on the night before your test. If you need to reschedule, please call radiology at [phone]. ________________________________________________________________ An upper GI series uses x rays to help diagnose problems of the upper GI tract, which includes the esophagus, stomach, and duodenum. The duodenum is the first part of the small intestine. An upper GI series is conducted by a radiology technologist or a radiologist-a doctor who specializes in x-ray imaging-at a hospital or outpatient center. While sitting or standing in front of an x-ray machine, the patient drinks barium liquid, which is often white and has a chalky consistency and taste. The barium liquid coats the lining of the upper GI tract and makes signs of disease show up more clearly on x rays. X-ray video, called fluoroscopy, is used to view the barium liquid moving through the esophagus, stomach, and duodenum. Additional x rays and fluoroscopy are performed while the patient lies on an x-ray table. To fully coat the upper GI tract with barium liquid, the technologist or radiologist may press on the abdomen or ask the patient to change position. Patients hold still in various positions, allowing the technologist or radiologist to take x rays of the upper GI tract at different angles. If a technologist conducts the upper GI series, a radiologist will later examine the images to look for problems.  This test typically takes about 1 hour to  complete. __________________________________________________________________  Continue Carafate four times a day.   We have sent the following medications to your pharmacy for you to pick up at your convenience: Pantoprazole 40 mg to take 30 minutes before breakfast.

## 2019-06-20 ENCOUNTER — Other Ambulatory Visit: Payer: Medicaid Other

## 2019-06-20 DIAGNOSIS — R1084 Generalized abdominal pain: Secondary | ICD-10-CM

## 2019-06-20 DIAGNOSIS — R140 Abdominal distension (gaseous): Secondary | ICD-10-CM

## 2019-06-21 LAB — HELICOBACTER PYLORI  SPECIAL ANTIGEN
MICRO NUMBER:: 1014197
SPECIMEN QUALITY: ADEQUATE

## 2019-06-25 ENCOUNTER — Ambulatory Visit (HOSPITAL_COMMUNITY)
Admission: RE | Admit: 2019-06-25 | Discharge: 2019-06-25 | Disposition: A | Discharge status: Home / Self Care | Payer: Medicaid Other | Source: Ambulatory Visit | Attending: Gastroenterology | Admitting: Gastroenterology

## 2019-06-25 ENCOUNTER — Other Ambulatory Visit: Payer: Self-pay | Admitting: Gastroenterology

## 2019-06-25 ENCOUNTER — Other Ambulatory Visit: Payer: Self-pay

## 2019-06-25 ENCOUNTER — Ambulatory Visit: Payer: Medicaid Other | Admitting: Physical Therapy

## 2019-06-25 DIAGNOSIS — R1084 Generalized abdominal pain: Secondary | ICD-10-CM

## 2019-06-25 DIAGNOSIS — R140 Abdominal distension (gaseous): Secondary | ICD-10-CM | POA: Diagnosis present

## 2019-07-03 ENCOUNTER — Telehealth: Payer: Self-pay | Admitting: Gastroenterology

## 2019-07-03 DIAGNOSIS — Z1159 Encounter for screening for other viral diseases: Secondary | ICD-10-CM

## 2019-07-03 DIAGNOSIS — R140 Abdominal distension (gaseous): Secondary | ICD-10-CM

## 2019-07-03 DIAGNOSIS — R1084 Generalized abdominal pain: Secondary | ICD-10-CM

## 2019-07-03 NOTE — Telephone Encounter (Signed)
Should patient be scheduled for an EGD per last office note? She has a follow up on 12-15 with you but there are endoscopy spots open prior to then

## 2019-07-03 NOTE — Telephone Encounter (Signed)
Please scheduled EGD if she would like to proceed with upper endoscopy prior to her office visit due to non-response to treatment. Thank you.

## 2019-07-03 NOTE — Telephone Encounter (Signed)
Pt reported that pantoprazole has not been helping her symptoms.

## 2019-07-04 NOTE — Telephone Encounter (Signed)
Spoke to patient and clarified instructions about covid testing and procedure. Answered all questions and patient verbalized understanding.

## 2019-07-04 NOTE — Telephone Encounter (Signed)
Pt stated that she does not know how to use MyChart and cannot find instructions letter.  She requested a call back.

## 2019-07-04 NOTE — Telephone Encounter (Signed)
Spoke with patient and informed her. She agreed to schedule an EGD, signed her up for MyChart since she does not have reliable transportation to come get the instructions. Will send her instructions via MyChart.

## 2019-07-04 NOTE — Telephone Encounter (Signed)
Instructions sent to patient via MyChart now that she is active

## 2019-07-11 ENCOUNTER — Ambulatory Visit (INDEPENDENT_AMBULATORY_CARE_PROVIDER_SITE_OTHER): Payer: Medicaid Other

## 2019-07-11 DIAGNOSIS — Z1159 Encounter for screening for other viral diseases: Secondary | ICD-10-CM

## 2019-07-11 LAB — SARS CORONAVIRUS 2 (TAT 6-24 HRS): SARS Coronavirus 2: NEGATIVE

## 2019-07-13 ENCOUNTER — Telehealth: Payer: Self-pay | Admitting: *Deleted

## 2019-07-13 ENCOUNTER — Encounter: Payer: Self-pay | Admitting: Gastroenterology

## 2019-07-13 ENCOUNTER — Ambulatory Visit (AMBULATORY_SURGERY_CENTER): Payer: Medicaid Other | Admitting: Gastroenterology

## 2019-07-13 ENCOUNTER — Other Ambulatory Visit: Payer: Self-pay

## 2019-07-13 VITALS — BP 133/91 | HR 64 | Temp 98.5°F | Resp 10 | Ht 62.0 in | Wt 191.0 lb

## 2019-07-13 DIAGNOSIS — K3189 Other diseases of stomach and duodenum: Secondary | ICD-10-CM | POA: Diagnosis not present

## 2019-07-13 DIAGNOSIS — R1084 Generalized abdominal pain: Secondary | ICD-10-CM

## 2019-07-13 MED ORDER — SODIUM CHLORIDE 0.9 % IV SOLN: 500.0000 mL | INTRAVENOUS | Status: DC

## 2019-07-13 NOTE — Telephone Encounter (Signed)
Surgical referral faxed to [facility].  

## 2019-07-13 NOTE — Patient Instructions (Signed)
Handouts given for peptic ulcer disease and hiatal hernia.  Increase your pantoprazole to 40 mg twice a day for 8 weeks.  No aspirin, ibuprofen, aleve, naproxen or other NSAIDS.  YOU HAD AN ENDOSCOPIC PROCEDURE TODAY AT [facility]:   Refer to the procedure report that was given to you for any specific questions about what was found during the examination.  If the procedure report does not answer your questions, please call your gastroenterologist to clarify.  If you requested that your care partner not be given the details of your procedure findings, then the procedure report has been included in a sealed envelope for you to review at your convenience later.  YOU SHOULD EXPECT: Some feelings of bloating in the abdomen. Passage of more gas than usual.  Walking can help get rid of the air that was put into your GI tract during the procedure and reduce the bloating. If you had a lower endoscopy (such as a colonoscopy or flexible sigmoidoscopy) you may notice spotting of blood in your stool or on the toilet paper. If you underwent a bowel prep for your procedure, you may not have a normal bowel movement for a few days.  Please Note:  You might notice some irritation and congestion in your nose or some drainage.  This is from the oxygen used during your procedure.  There is no need for concern and it should clear up in a day or so.  SYMPTOMS TO REPORT IMMEDIATELY:   Following upper endoscopy (EGD)  Vomiting of blood or coffee ground material  New chest pain or pain under the shoulder blades  Painful or persistently difficult swallowing  New shortness of breath  Fever of 100F or higher  Black, tarry-looking stools  For urgent or emergent issues, a gastroenterologist can be reached at any hour by calling [phone].   DIET:  We do recommend a small meal at first, but then you may proceed to your regular diet.  Drink plenty of fluids but you should avoid alcoholic beverages  for 24 hours.  ACTIVITY:  You should plan to take it easy for the rest of today and you should NOT DRIVE or use heavy machinery until tomorrow (because of the sedation medicines used during the test).    FOLLOW UP: Our staff will call the number listed on your records 48-72 hours following your procedure to check on you and address any questions or concerns that you may have regarding the information given to you following your procedure. If we do not reach you, we will leave a message.  We will attempt to reach you two times.  During this call, we will ask if you have developed any symptoms of COVID 19. If you develop any symptoms (ie: fever, flu-like symptoms, shortness of breath, cough etc.) before then, please call [phone].  If you test positive for Covid 19 in the 2 weeks post procedure, please call and report this information to [place].    If any biopsies were taken you will be contacted by phone or by letter within the next 1-3 weeks.  Please call us at [phone] if you have not heard about the biopsies in 3 weeks.    SIGNATURES/CONFIDENTIALITY: You and/or your care partner have signed paperwork which will be entered into your electronic medical record.  These signatures attest to the fact that that the information above on your After Visit Summary has been reviewed and is understood.  Full responsibility of the confidentiality of  this discharge information lies with you and/or your care-partner.

## 2019-07-13 NOTE — Progress Notes (Signed)
Report given to PACU, vss 

## 2019-07-13 NOTE — Telephone Encounter (Signed)
-----   Message from Thornton Park, MD sent at 07/13/2019  9:17 AM EST ----- Patient and family request surgical consultation for hiatal hernia. Please refer to [facility]. Thank you.

## 2019-07-13 NOTE — Op Note (Addendum)
[facility] Patient Name: [name] Procedure Date: 07/13/2019 8:57 AM MRN: [id number] Endoscopist: Tressia Danas MD, MD Age: 33 Referring MD:  Date of Birth: 22-Sep-1985 Gender: Female Account #: [id number] Procedure:                Upper GI endoscopy Indications:              Epigastric abdominal pain                           Bloating                           Heartburn                           Nausea and vomiting                           There is a strong family history of symptomatic                            hiatal hernias requiring surgery Medicines:                Monitored Anesthesia Care Procedure:                Pre-Anesthesia Assessment:                           - Prior to the procedure, a History and Physical                            was performed, and patient medications and                            allergies were reviewed. The patient's tolerance of                            previous anesthesia was also reviewed. The risks                            and benefits of the procedure and the sedation                            options and risks were discussed with the patient.                            All questions were answered, and informed consent                            was obtained. Prior Anticoagulants: The patient has                            taken no previous anticoagulant or antiplatelet                            agents. ASA Grade Assessment: II - A patient with  mild systemic disease. After reviewing the risks                            and benefits, the patient was deemed in                            satisfactory condition to undergo the procedure.                           After obtaining informed consent, the endoscope was                            passed under direct vision. Throughout the                            procedure, the patient's blood pressure, pulse, and   oxygen saturations were monitored continuously. The                            Endoscope was introduced through the mouth, and                            advanced to the third part of duodenum. The upper                            GI endoscopy was accomplished without difficulty.                            The patient tolerated the procedure well. Scope In: Scope Out: Findings:                 The esophagus was normal.                           Multiple localized erosions with no bleeding and no                            stigmata of recent bleeding were found in the                            gastric body and in the gastric antrum. Biopsies                            were taken from the antrum, body, and fundus with a                            cold forceps for histology. Estimated blood loss                            was minimal.                           One non-bleeding cratered gastric ulcer with no  stigmata of bleeding was found in the gastric                            antrum. The lesion was 3 mm in largest dimension.                            The ulcer margin was biopsied and included with the                            other gastric biopsies for evaluation.                           A medium-sized hiatal hernia was present.                           The examined duodenum was normal. Biopsies were                            taken with a cold forceps for histology to exclude                            celiac. Complications:            No immediate complications. Estimated blood loss:                            Minimal. Estimated Blood Loss:     Estimated blood loss was minimal. Impression:               - Normal esophagus.                           - Erosive gastropathy with no bleeding and no                            stigmata of recent bleeding. Biopsied.                           - Non-bleeding gastric ulcer with no stigmata of                             bleeding.                           - Medium-sized hiatal hernia.                           - Normal examined duodenum. Biopsied. Recommendation:           - Patient has a contact number available for                            emergencies. The signs and symptoms of potential                            delayed complications were discussed with the  patient. Return to normal activities tomorrow.                            Written discharge instructions were provided to the                            patient.                           - Resume previous diet.                           - Continue present medications.                           - Increase pantoprazole to 40 mg twice daily for 8                            weeks.                           - No aspirin, ibuprofen, naproxen, or other                            non-steroidal anti-inflammatory drugs.                           - Await pathology results.                           - Her grandmother accompanies her to this                            appointment today and requested surgical                            consultation given the hiatal hernia. Thornton Park MD, MD 07/13/2019 9:19:08 AM This report has been signed electronically.

## 2019-07-13 NOTE — Progress Notes (Signed)
Called to room to assist during endoscopic procedure.  Patient ID and intended procedure confirmed with present staff. Received instructions for my participation in the procedure from the performing physician.  

## 2019-07-13 NOTE — Progress Notes (Signed)
Temp JB  vs CW 

## 2019-07-17 ENCOUNTER — Telehealth: Payer: Self-pay | Admitting: *Deleted

## 2019-07-17 ENCOUNTER — Telehealth: Payer: Self-pay

## 2019-07-17 ENCOUNTER — Encounter: Payer: Self-pay | Admitting: Gastroenterology

## 2019-07-17 NOTE — Telephone Encounter (Signed)
Called [phone] and left a messaged we tried to reach pt for a follow up call. maw

## 2019-07-17 NOTE — Telephone Encounter (Signed)
  Follow up Call-  Call back number 07/13/2019  Post procedure Call Back phone  # [phone]  Permission to leave phone message Yes  Some recent data might be hidden     Patient questions:  Message left to call us if necessary.

## 2019-08-06 ENCOUNTER — Telehealth: Payer: Self-pay | Admitting: Gastroenterology

## 2019-08-06 NOTE — Telephone Encounter (Signed)
If she is using the Pantoprazole 40 mg BID and the Carafate 1 g QID and still having difficulty, she could add FDGard. Thank you.

## 2019-08-06 NOTE — Telephone Encounter (Signed)
Notified the patient of Dr. Tarri Glenn recommendations.

## 2019-08-06 NOTE — Telephone Encounter (Signed)
Spoke to the patient who reports pain from her hernia. When asked what does the pain feel like, the patient replied "I don't know, it just hurts." She reports he is using a heating pad and taking tylenol. The patient was told on the phone the Dr. Tarri Glenn more than likely will not prescribe narcotic pain medication and that her pain would likely be relieved with surgery. The patient stated she has be consultation with [facility] on 12/30. The patient also has a f/u visit with Dr. Tarri Glenn on 12/15 at 3 pm. Please advise.

## 2019-08-13 ENCOUNTER — Telehealth: Payer: Self-pay | Admitting: *Deleted

## 2019-08-13 NOTE — Telephone Encounter (Signed)
Followed up with [facility], patient has a consult scheduled with Dr. Windle Guard at 3:40 pm on 08/29/19.

## 2019-08-14 ENCOUNTER — Encounter: Payer: Self-pay | Admitting: Gastroenterology

## 2019-08-14 ENCOUNTER — Other Ambulatory Visit: Payer: Self-pay

## 2019-08-14 ENCOUNTER — Ambulatory Visit: Payer: Medicaid Other | Admitting: Gastroenterology

## 2019-08-14 VITALS — BP 114/82 | HR 115 | Temp 97.2°F | Ht 62.0 in | Wt 196.4 lb

## 2019-08-14 DIAGNOSIS — K259 Gastric ulcer, unspecified as acute or chronic, without hemorrhage or perforation: Secondary | ICD-10-CM | POA: Diagnosis not present

## 2019-08-14 DIAGNOSIS — R1084 Generalized abdominal pain: Secondary | ICD-10-CM

## 2019-08-14 DIAGNOSIS — R140 Abdominal distension (gaseous): Secondary | ICD-10-CM | POA: Diagnosis not present

## 2019-08-14 MED ORDER — DICYCLOMINE HCL 20 MG PO TABS: 20.0000 mg | ORAL_TABLET | Freq: Three times a day (TID) | ORAL | 3 refills | Status: DC

## 2019-08-14 NOTE — Patient Instructions (Addendum)
We have sent the following medications to your pharmacy for you to pick up at your convenience:  Dicyclomine 20 mg four times a day as needed   Continue Carafate four times a day and Pantoprazole twice a day   Please call the office with an update of your symptoms in the next two weeks and ask for Janeece Riggers, Therapist, sports, BSN.

## 2019-08-14 NOTE — Progress Notes (Signed)
Referring Provider: [facility]* Primary Care Physician:  [facility]  Reason for Consultation:  Abdominal pain   IMPRESSION:  Gastric ulcer and erosions 07/13/19    - biopsies negative for H pylori Epigastric abdominal pain Bloating    - duodenal biopsies negative for celiac disease Medium sized hiatal hernia Intermittent nausea and vomiting Abnormal abdominal CT suggesting colitis versus under distended colon 01/2019 Stool burden on abdominal x-rays 03/2019 Patient and family concerns for symptomatic hernia  Persistent symptoms despite carafate and pantoprazole. Reviewed EGD and associated pathology results with the patient today. Given her recent EGD findings, the differential for upper abdominal pain with nausea, vomiting, bloating, and heartburn without alarm features includes persistent peptic ulcer disease, functional dyspepsia, gastroparesis, functional abdominal pain, and bacterial overgrowth.  Recent abdominal imaging and labs exclude hepatic or pancreatic etiologies.  There is no suggestion of symptomatic gallstones.  She is most concerned about a symptomatic hernia and has plans for surgical consultation later this month.    PLAN: Continue Carafate QID, and pantoprazole 40 mg BID Dicyclomine 20 mg QID prn  If no improvement after two weeks please call and we will start desipramine Return to this clinic in 8-12 weeks Consider repeat EGD to document ulcer healing if symptoms persist  Please see the "Patient Instructions" section for addition details about the plan.  HPI: [name] is a 33 y.o. female on disability.  Initially referred by PA Dellis Filbert with [facility] for epigastric abdominal pain and vomiting with nausea. She had an upper endoscopy 07/13/19. She returns in scheduled follow-up.  The interval history is obtained to the patient, review of medical records from [place], and  review of her available electronic health record.  Previously worked at a [employer]. She has anxiety, asthma, depression, bipolar affective disorder, chronic low back pain, and endometriosis.    Reports abdominal problems following her cholecystectomy 9 years ago that was performed during pregnancy. Heartburn and epigastric burning developed 5 months after her cholecystectomy.  Symptoms are intermittent and occasionally associated with non-radiating RUQ cramping and aching. Localized to the area where her gallbladder used to be. Associated bloating, distension, nausea and vomiting. Symptoms are similar to those prior to her cholecystectomy.  No change in symptoms with eating, defecation, or movement. Spicy foods, pizza, spaghetti, and lasagna seem to trigger her symptoms.   She had been using sucralfate QID and prilosec PRN without significant improvement. Ranitidine provided relief of her heartburn.  No benefit from Tums.  Ondansetron provided only temporary benefit.  She was started on pantoprazole in October. She cut back on smoking using a water vapor device and was able to eliminate caffeine and carbonated beverages.  She denies use of any NSAIDs.  A CT performed during an ER evaluation 02/17/2019 showed mild diffuse circumferential colonic wall thickening that could be due to under distention throughout the colon.  No source for her abdominal pain identified.  She was treated with Cipro, Flagyl, ondansetron, and oxycodone.  She was subsequently seen in the urgent care 04/12/2019 for constipation, nausea, and vomiting.  Recent studies include a normal CBC, negative C. difficile stool study, negative stool culture for E. coli, Campylobacter, Shigella, and Salmonella.  Ova and parasite gait screen was negative.  Labs from 05/17/2019 show a normal comprehensive metabolic panel except for a creatinine of 0.55.  Hep C antibody virus was negative, hep B surface antigen negative, hepatitis A IgM  negative, HIV nonreactive.  EGD 07/13/19 showed a normal esophagus, a medium sized hiatal hernia, gastric erosions in the body and antrum, and a 3mm gastric antral ulcer. Gastric biopsies showed reactive gastropathy. There was no H pylori. Duodenal biopsies were negative.  Returns today is scheduled follow-up. Tried FDGard without improvement after she noted no improvement with daily pantoprazole.  Notes associated nausea such that she vomits the pills back up. Early satiety. Having nausea.  No change with eating or defecation.  Heating pad provides some relief.  Previously took Associate ProfessorBuspar for anxiety. Didn't find that it was helpfu with anxiety and doesn't remember her GI symptoms being better tolerated.  Her grandmother remains concerned that it's a hernia and that she required surgery.  08/29/19 appointment with the surgeon to consider hernia repair.   Prior abdominal imaging: Intraoperative cholangiogram 04/17/2019: Normal CT of the abdomen and pelvis with contrast 01/16/2015: Tiny umbilical hernia containing fat, post right oophorectomy, no acute abnormality CT of the abdomen and pelvis with contrast 04/29/2015: Abnormality CT renal stone study 05/03/2017: Small hiatal hernia, mild hydronephrosis otherwise normal CT of the abdomen and pelvis with contrast 02/17/2019: Mild apparent diffuse circumferential colonic wall thickening potentially accentuated due to under distention otherwise no explanation for the patient's severe right upper quadrant abdominal pain, moderately sized hiatal hernia, prior cholecystectomy Abdominal x-ray 04/12/2019: Moderate stool in the colon.  No bowel obstruction. CT of the angio chest PE with or without contrast 05/29/2019: No PE, small airway disease/asthma, moderate hiatal hernia  Past Medical History:  Diagnosis Date  . Anxiety   . Asthma   . Bipolar 1 disorder (HCC)   . Cataract    left eye  . Depression   . Endometriosis   . Mental disorder     Past Surgical  History:  Procedure Laterality Date  . ABDOMINAL SURGERY     "For endometriosis"  . CHOLECYSTECTOMY    . OOPHORECTOMY Right 2012  . TUBAL LIGATION      Current Outpatient Medications  Medication Sig Dispense Refill  . albuterol (PROVENTIL HFA;VENTOLIN HFA) 108 (90 BASE) MCG/ACT inhaler Inhale 1-2 puffs into the lungs every 6 (six) hours as needed for wheezing or shortness of breath.     Marland Kitchen. CALCIUM-VITAMIN D PO Take 1 tablet by mouth daily.    . clonazePAM (KLONOPIN) 1 MG tablet Take 1 tablet by mouth 3 (three) times daily as needed for anxiety.    . pantoprazole (PROTONIX) 40 MG tablet Take 1 tablet (40 mg total) by mouth daily. 30 tablet 3  . primidone (MYSOLINE) 50 MG tablet Take by mouth 4 (four) times daily.    . QUEtiapine (SEROQUEL) 200 MG tablet Take 200 mg by mouth at bedtime.    . SPRINTEC 28 0.25-35 MG-MCG tablet Take 1 tablet by mouth daily.    . sucralfate (CARAFATE) 1 g tablet Take 1 g by mouth 4 (four) times daily.     Marland Kitchen. topiramate (TOPAMAX) 50 MG tablet Take by mouth.    . valACYclovir (VALTREX) 500 MG tablet Take 500 mg by mouth daily as needed (outbreak).      No current facility-administered medications for this visit.    Allergies as of 08/14/2019 - Review Complete 08/14/2019  Allergen Reaction Noted  . Bee venom Anaphylaxis and Swelling 01/15/2015  . Coconut flavor Anaphylaxis 09/23/2014  . Ibuprofen Hives 07/22/2017  . Naproxen Nausea And Vomiting 05/03/2017    Family History  Problem Relation Age of Onset  . Stroke Mother   . Hyperlipidemia Other   .  Colon cancer Neg Hx   . Stomach cancer Neg Hx   . Pancreatic cancer Neg Hx   . Esophageal cancer Neg Hx     Social History   Socioeconomic History  . Marital status: Legally Separated    Spouse name: Not on file  . Number of children: Not on file  . Years of education: Not on file  . Highest education level: Not on file  Occupational History  . Not on file  Tobacco Use  . Smoking status: Current  Every Day Smoker    Types: Cigarettes  . Smokeless tobacco: Never Used  Substance and Sexual Activity  . Alcohol use: Never  . Drug use: Yes    Types: Marijuana, Barbituates    Comment: used weed 12 Jul 2019  . Sexual activity: Yes    Partners: Male    Birth control/protection: Surgical, Pill  Other Topics Concern  . Not on file  Social History Narrative  . Not on file   Social Determinants of Health   Financial Resource Strain:   . Difficulty of Paying Living Expenses: Not on file  Food Insecurity:   . Worried About Programme researcher, broadcasting/film/video in the Last Year: Not on file  . Ran Out of Food in the Last Year: Not on file  Transportation Needs:   . Lack of Transportation (Medical): Not on file  . Lack of Transportation (Non-Medical): Not on file  Physical Activity:   . Days of Exercise per Week: Not on file  . Minutes of Exercise per Session: Not on file  Stress:   . Feeling of Stress : Not on file  Social Connections:   . Frequency of Communication with Friends and Family: Not on file  . Frequency of Social Gatherings with Friends and Family: Not on file  . Attends Religious Services: Not on file  . Active Member of Clubs or Organizations: Not on file  . Attends Banker Meetings: Not on file  . Marital Status: Not on file  Intimate Partner Violence:   . Fear of Current or Ex-Partner: Not on file  . Emotionally Abused: Not on file  . Physically Abused: Not on file  . Sexually Abused: Not on file    Physical Exam: General:   Alert,  well-nourished, pleasant and cooperative in NAD Head:  Normocephalic and atraumatic. Eyes:  Sclera clear, no icterus.   Conjunctiva pink. Abdomen:  Soft, nontender, nondistended, normal bowel sounds, no rebound or guarding. No hepatosplenomegaly.   Msk:  Symmetrical. No obvious boney deformities LAD: No inguinal or umbilical LAD Extremities:  No clubbing or edema. Neurologic:  Alert and  oriented x4;  grossly nonfocal Skin:   Intact without significant lesions or rashes. Psych:  Alert and cooperative. Normal mood and affect.    Derrel [name] L. Orvan Falconer, MD, MPH 08/14/2019, 3:06 PM

## 2019-08-29 ENCOUNTER — Ambulatory Visit: Payer: Self-pay | Admitting: Surgery

## 2019-08-29 NOTE — H&P (Signed)
Surgical  H&P  CC: multiple  HPI: 33 year old woman with multiple abdominal complaints including pain/discomfort, reflux, bloating, nausea and vomiting, hiatal hernia noted on multiple imaging studies, possible colitis vs underdistended colon on CT 01/2019, stool burden noted on xrays 03/2019. Despite poor appetite notes unintended weight gain. Symptoms unimproved with carafate and PPI.  She underwent laparoscopic cholecystectomy while [redacted]wks pregnant (son is 759, lives with his father). She has also had diagnostic laparoscopy with ablation of endometriosis and right oophorectomy for refractory pelvic pain  History otherwise notable for bipolar/anxiety/depression which she states is currently well-controlled. Lives at home with her boyfriend and grandmother. On disability due to mental illness. Smokes 4-5 cigarettes per day  UGI 06/25/19: moderate sized hiatal hernia, extensive gastroesophageal reflux, normal esophageal motility  Upper endoscopy July 13, 2019 Dr. Orvan FalconerBeavers: multiple localized erosions with no bleeding noted in gastric antrum.  (Biopsies benign, H. Pylori negative), 1 nonbleeding cratered gastric ulcer 3 mm, no stigmata of bleeding (biopsies negative), medium hiatal hernia, duodenum normal  Allergies  Allergen Reactions  . Bee Venom Anaphylaxis and Swelling  . Coconut Flavor Anaphylaxis  . Ibuprofen Hives  . Naproxen Nausea And Vomiting    Past Medical History:  Diagnosis Date  . Anxiety   . Asthma   . Bipolar 1 disorder (HCC)   . Cataract    left eye  . Depression   . Endometriosis   . Mental disorder     Past Surgical History:  Procedure Laterality Date  . ABDOMINAL SURGERY     "For endometriosis"  . CHOLECYSTECTOMY    . OOPHORECTOMY Right 2012  . TUBAL LIGATION      Family History  Problem Relation Age of Onset  . Stroke Mother   . Hyperlipidemia Other   . Colon cancer Neg Hx   . Stomach cancer Neg Hx   . Pancreatic cancer Neg Hx   . Esophageal  cancer Neg Hx     Social History   Socioeconomic History  . Marital status: Legally Separated    Spouse name: Not on file  . Number of children: Not on file  . Years of education: Not on file  . Highest education level: Not on file  Occupational History  . Not on file  Tobacco Use  . Smoking status: Current Every Day Smoker    Types: Cigarettes  . Smokeless tobacco: Never Used  Substance and Sexual Activity  . Alcohol use: Never  . Drug use: Yes    Types: Marijuana, Barbituates    Comment: used weed 12 Jul 2019  . Sexual activity: Yes    Partners: Male    Birth control/protection: Surgical, Pill  Other Topics Concern  . Not on file  Social History Narrative  . Not on file   Social Determinants of Health   Financial Resource Strain:   . Difficulty of Paying Living Expenses: Not on file  Food Insecurity:   . Worried About Programme researcher, broadcasting/film/videounning Out of Food in the Last Year: Not on file  . Ran Out of Food in the Last Year: Not on file  Transportation Needs:   . Lack of Transportation (Medical): Not on file  . Lack of Transportation (Non-Medical): Not on file  Physical Activity:   . Days of Exercise per Week: Not on file  . Minutes of Exercise per Session: Not on file  Stress:   . Feeling of Stress : Not on file  Social Connections:   . Frequency of Communication with Friends and Family: Not on  file  . Frequency of Social Gatherings with Friends and Family: Not on file  . Attends Religious Services: Not on file  . Active Member of Clubs or Organizations: Not on file  . Attends Banker Meetings: Not on file  . Marital Status: Not on file    Current Outpatient Medications on File Prior to Visit  Medication Sig Dispense Refill  . albuterol (PROVENTIL HFA;VENTOLIN HFA) 108 (90 BASE) MCG/ACT inhaler Inhale 1-2 puffs into the lungs every 6 (six) hours as needed for wheezing or shortness of breath.     Marland Kitchen CALCIUM-VITAMIN D PO Take 1 tablet by mouth daily.    . clonazePAM  (KLONOPIN) 1 MG tablet Take 1 tablet by mouth 3 (three) times daily as needed for anxiety.    . dicyclomine (BENTYL) 20 MG tablet Take 1 tablet (20 mg total) by mouth 4 (four) times daily -  before meals and at bedtime. 120 tablet 3  . pantoprazole (PROTONIX) 40 MG tablet Take 1 tablet (40 mg total) by mouth daily. 30 tablet 3  . primidone (MYSOLINE) 50 MG tablet Take by mouth 4 (four) times daily.    . QUEtiapine (SEROQUEL) 200 MG tablet Take 200 mg by mouth at bedtime.    . SPRINTEC 28 0.25-35 MG-MCG tablet Take 1 tablet by mouth daily.    . sucralfate (CARAFATE) 1 g tablet Take 1 g by mouth 4 (four) times daily.     Marland Kitchen topiramate (TOPAMAX) 50 MG tablet Take by mouth.    . valACYclovir (VALTREX) 500 MG tablet Take 500 mg by mouth daily as needed (outbreak).     . [DISCONTINUED] gabapentin (NEURONTIN) 300 MG capsule Take 1 capsule (300 mg total) by mouth 3 (three) times daily. 90 capsule 2  . [DISCONTINUED] risperiDONE (RISPERDAL) 1 MG tablet Take 1 tablet (1 mg total) by mouth at bedtime. For mood control (Patient not taking: Reported on 03/28/2018) 30 tablet 0  . [DISCONTINUED] traZODone (DESYREL) 50 MG tablet Take 1 tablet (50 mg total) by mouth at bedtime as needed for sleep. 30 tablet 0   No current facility-administered medications on file prior to visit.    Review of Systems: a complete, 10pt review of systems was completed with pertinent positives and negatives as documented in the HPI  Physical Exam: HR 131 97.62F sat 94% BMI 34.92 (197lb) Gen: A&Ox3, no distress  Head: normocephalic, atraumatic Eyes: extraocular motions intact, anicteric.  Chest: unlabored respirations, symmetrical air entry*  Cardiovascular: RRR with palpable distal pulses, no pedal edema Abdomen: soft, nondistended, nontender. No mass or organomegaly. Multiple well healed laparoscopic sites  Extremities: warm, without edema, no deformities  Neuro: grossly intact Psych: appropriate mood and flat affect, normal  insight  Skin: warm and dry   CBC Latest Ref Rng & Units 05/28/2019 04/12/2019 02/21/2019  WBC 4.0 - 10.5 K/uL 6.2 6.5 6.8  Hemoglobin 12.0 - 15.0 g/dL 26.8 15.1(H) 15.5(H)  Hematocrit 36.0 - 46.0 % 40.7 42.6 45.6  Platelets 150 - 400 K/uL 200 190 161    CMP Latest Ref Rng & Units 05/28/2019 02/21/2019 02/17/2019  Glucose 70 - 99 mg/dL 341(D) 96 622(W)  BUN 6 - 20 mg/dL 7 8 8   Creatinine 0.44 - 1.00 mg/dL 9.79 8.92  Sodium 135 - 145 mmol/L 138 138 138  Potassium 3.5 - 5.1 mmol/L 3.7 3.9 3.5  Chloride 98 - 111 mmol/L 106 105 105  CO2 22 - 32 mmol/L 22 27 24   Calcium 8.9 - 10.3 mg/dL 1.19)  9.3 9.2  Total Protein 6.5 - 8.1 g/dL - 7.4 7.6  Total Bilirubin 0.3 - 1.2 mg/dL - 0.6 0.6  Alkaline Phos 38 - 126 U/L - 57 66  AST 15 - 41 U/L - 19 17  ALT 0 - 44 U/L - 17 22    No results found for: INR, PROTIME  Imaging: No results found.   A/P: Hiatal hernia. I had a long discussion with her and we went over the anatomy and surgical procedure of robotic hiatal hernia repair with  Using a diagram to demonstrate.  We discussed risks of bleeding, infection, pain, scarring, injury to intra-abdominal or mediastinal structures, hernia recurrence, dysphagia, gas bloat, and importantly in her scenario, failure to resolve all of her presenting symptoms.  She may have a component of functional abdominal pain. Repairing the hiatal hernia will help with the reflux, but as to her abdominal pain, nausea, etc it is unclear if this will resolve, esp given findings of ulcer on endoscopy. Questions were welcomed and answered. She would like to pursue surgery.   Patient Active Problem List   Diagnosis Date Noted  . Bipolar 1 disorder, depressed, severe ([place]) 02/22/2019  . Severe recurrent major depression with psychotic features ([place]) 07/23/2017  . Substance use disorder 07/23/2017  . Pelvic pain in female 01/15/2015  . MDD (major depressive disorder), recurrent episode, severe ([place]) 03/11/2014  . MDD  (major depressive disorder) 03/11/2014  . Bipolar disorder with depression ([place]) 09/14/2013  . Major depressive disorder, recurrent episode, severe ([place]) 12/30/2011    Class: Stage 3  . Bipolar affective disorder, depressed, severe ([place]) 12/30/2011    Class: Acute       Romana Juniper, MD [facility]  See AMION to contact appropriate on-call provider

## 2019-10-05 NOTE — Patient Instructions (Addendum)
DUE TO COVID-19 ONLY ONE VISITOR IS ALLOWED TO COME WITH YOU AND STAY IN THE WAITING ROOM ONLY DURING PRE OP AND PROCEDURE DAY OF SURGERY. THE 1 VISITOR MAY VISIT WITH YOU AFTER SURGERY IN YOUR PRIVATE ROOM DURING VISITING HOURS ONLY!  YOU NEED TO HAVE A COVID 19 TEST ON_2/6______ @_10 :55______, THIS TEST MUST BE DONE BEFORE SURGERY, COME  [place]  , [id number].  ([place]) ONCE YOUR COVID TEST IS COMPLETED, PLEASE BEGIN THE QUARANTINE INSTRUCTIONS AS OUTLINED IN YOUR HANDOUT.                [place]   Your procedure is scheduled on: 10/10/19   Report to [facility] Main  Entrance   Report to admitting at  6:30 AM     Call this number if you have problems the morning of surgery [phone]    Remember: Do not eat food or drink liquids :After Midnight.   BRUSH YOUR TEETH MORNING OF SURGERY AND RINSE YOUR MOUTH OUT, NO CHEWING GUM CANDY OR MINTS.     Take these medicines the morning of surgery with A SIP OF WATER: Klonopin, Topamax, Mysoline, Prozac, Seroquel, Requip, Protonix, contraceptive                                 You may not have any metal on your body including hair pins and              piercings  Do not wear jewelry, make-up, lotions, powders or perfumes, deodorant             Do not wear nail polish on your fingernails.  Do not shave  48 hours prior to surgery.         Do not bring valuables to the hospital. [place].  Contacts, dentures or bridgework may not be worn into surgery.      Patients discharged the day of surgery will not be allowed to drive home.   IF YOU ARE HAVING SURGERY AND GOING HOME THE SAME DAY, YOU MUST HAVE AN ADULT TO DRIVE YOU HOME AND BE WITH YOU FOR 24 HOURS. YOU MAY GO HOME BY TAXI OR UBER OR ORTHERWISE, BUT AN ADULT MUST ACCOMPANY YOU HOME AND STAY WITH YOU FOR 24 HOURS.  Name and phone number of your driver:  Special Instructions:  N/A              Please read over the following fact sheets you were given: _____________________________________________________________________             [facility] - Preparing for Surgery  Before surgery, you can play an important role.   Because skin is not sterile, your skin needs to be as free of germs as possible.   You can reduce the number of germs on your skin by washing with CHG (chlorahexidine gluconate) soap before surgery.   CHG is an antiseptic cleaner which kills germs and bonds with the skin to continue killing germs even after washing. Please DO NOT use if you have an allergy to CHG or antibacterial soaps.   If your skin becomes reddened/irritated stop using the CHG and inform your nurse when you arrive at Short Stay. Do not shave (including legs and underarms) for at least 48 hours prior  to the first CHG shower  Please follow these instructions carefully:  1.  Shower with CHG Soap the night before surgery and the  morning of Surgery.  2.  If you choose to wash your hair, wash your hair first as usual with your  normal  shampoo.  3.  After you shampoo, rinse your hair and body thoroughly to remove the  shampoo.                                        4.  Use CHG as you would any other liquid soap.  You can apply chg directly  to the skin and wash                       Gently with a scrungie or clean washcloth.  5.  Apply the CHG Soap to your body ONLY FROM THE NECK DOWN.   Do not use on face/ open                           Wound or open sores. Avoid contact with eyes, ears mouth and genitals (private parts).                       Wash face,  Genitals (private parts) with your normal soap.             6.  Wash thoroughly, paying special attention to the area where your surgery  will be performed.  7.  Thoroughly rinse your body with warm water from the neck down.  8.  DO NOT shower/wash with your normal soap after using and rinsing off  the CHG Soap.             9.   Pat yourself dry with a clean towel.            10.  Wear clean pajamas.            11.  Place clean sheets on your bed the night of your first shower and do not  sleep with pets. Day of Surgery : Do not apply any lotions/deodorants the morning of surgery.  Please wear clean clothes to the hospital/surgery center.  FAILURE TO FOLLOW THESE INSTRUCTIONS MAY RESULT IN THE CANCELLATION OF YOUR SURGERY PATIENT SIGNATURE_________________________________  NURSE SIGNATURE__________________________________  ________________________________________________________________________

## 2019-10-06 ENCOUNTER — Other Ambulatory Visit (HOSPITAL_COMMUNITY)
Admission: RE | Admit: 2019-10-06 | Discharge: 2019-10-06 | Disposition: A | Discharge status: Home / Self Care | Payer: Medicaid Other | Source: Ambulatory Visit | Attending: Surgery | Admitting: Surgery

## 2019-10-06 DIAGNOSIS — Z01812 Encounter for preprocedural laboratory examination: Secondary | ICD-10-CM | POA: Insufficient documentation

## 2019-10-06 DIAGNOSIS — Z20822 Contact with and (suspected) exposure to covid-19: Secondary | ICD-10-CM | POA: Insufficient documentation

## 2019-10-06 LAB — SARS CORONAVIRUS 2 (TAT 6-24 HRS): SARS Coronavirus 2: NEGATIVE

## 2019-10-08 ENCOUNTER — Encounter (HOSPITAL_COMMUNITY)
Admission: RE | Admit: 2019-10-08 | Discharge: 2019-10-08 | Disposition: A | Discharge status: Home / Self Care | Payer: Medicaid Other | Source: Ambulatory Visit | Attending: Surgery | Admitting: Surgery

## 2019-10-08 ENCOUNTER — Encounter (HOSPITAL_COMMUNITY): Payer: Self-pay

## 2019-10-08 ENCOUNTER — Other Ambulatory Visit: Payer: Self-pay

## 2019-10-08 DIAGNOSIS — Z01812 Encounter for preprocedural laboratory examination: Secondary | ICD-10-CM | POA: Diagnosis present

## 2019-10-08 HISTORY — DX: Gastro-esophageal reflux disease without esophagitis: K21.9

## 2019-10-08 LAB — COMPREHENSIVE METABOLIC PANEL
ALT: 22 U/L (ref 0–44)
AST: 24 U/L (ref 15–41)
Albumin: 3.7 g/dL (ref 3.5–5.0)
Alkaline Phosphatase: 90 U/L (ref 38–126)
Anion gap: 9 (ref 5–15)
BUN: 7 mg/dL (ref 6–20)
CO2: 22 mmol/L (ref 22–32)
Calcium: 8.8 mg/dL — ABNORMAL LOW (ref 8.9–10.3)
Chloride: 109 mmol/L (ref 98–111)
Creatinine, Ser: 0.71 mg/dL (ref 0.44–1.00)
GFR calc Af Amer: >60 mL/min (ref >60)
GFR calc non Af Amer: >60 mL/min (ref >60)
Glucose, Bld: 90 mg/dL (ref 70–99)
Potassium: 3.2 mmol/L — ABNORMAL LOW (ref 3.5–5.1)
Sodium: 140 mmol/L (ref 135–145)
Total Bilirubin: 0.5 mg/dL (ref 0.3–1.2)
Total Protein: 7.1 g/dL (ref 6.5–8.1)

## 2019-10-08 LAB — CBC WITH DIFFERENTIAL/PLATELET
Abs Immature Granulocytes: 0.01 10*3/uL (ref 0.00–0.07)
Basophils Absolute: 0 10*3/uL (ref 0.0–0.1)
Basophils Relative: 1 %
Eosinophils Absolute: 0.1 10*3/uL (ref 0.0–0.5)
Eosinophils Relative: 3 %
HCT: 43.4 % (ref 36.0–46.0)
Hemoglobin: 15 g/dL (ref 12.0–15.0)
Immature Granulocytes: 0 %
Lymphocytes Relative: 41 %
Lymphs Abs: 2.1 10*3/uL (ref 0.7–4.0)
MCH: 32.5 pg (ref 26.0–34.0)
MCHC: 34.6 g/dL (ref 30.0–36.0)
MCV: 93.9 fL (ref 80.0–100.0)
Monocytes Absolute: 0.4 10*3/uL (ref 0.1–1.0)
Monocytes Relative: 7 %
Neutro Abs: 2.4 10*3/uL (ref 1.7–7.7)
Neutrophils Relative %: 48 %
Platelets: 216 10*3/uL (ref 150–400)
RBC: 4.62 MIL/uL (ref 3.87–5.11)
RDW: 13.2 % (ref 11.5–15.5)
WBC: 5 10*3/uL (ref 4.0–10.5)
nRBC: 0 % (ref 0.0–0.2)

## 2019-10-08 NOTE — Progress Notes (Signed)
PCP -  GP at [facility] general health Cardiologist no-   Chest x-ray - 05/28/19 EKG - 05/29/19 Stress Test - no ECHO - no Cardiac Cath - no  Sleep Study - no CPAP -   Fasting Blood Sugar - NA Checks Blood Sugar _____ times a day  Blood Thinner Instructions:NA Aspirin Instructions: Last Dose:  Anesthesia review:   Patient denies shortness of breath, fever, cough and chest pain at PAT appointment yes  Patient verbalized understanding of instructions that were given to them at the PAT appointment. Patient was also instructed that they will need to review over the PAT instructions again at home before surgery. yes

## 2019-10-09 MED ORDER — BUPIVACAINE LIPOSOME 1.3 % IJ SUSP
20.0000 mL | Freq: Once | INTRAMUSCULAR | Status: DC
  Filled 2019-10-09: qty 20

## 2019-10-09 NOTE — Anesthesia Preprocedure Evaluation (Addendum)
Anesthesia Evaluation  Patient identified by MRN, date of birth, ID band Patient awake    Reviewed: Allergy & Precautions, NPO status , Patient's Chart, lab work & pertinent test results  Airway Mallampati: II  TM Distance: >3 FB Neck ROM: Full    Dental  (+) Edentulous Upper   Pulmonary asthma , Current Smoker and Patient abstained from smoking.,    Pulmonary exam normal breath sounds clear to auscultation       Cardiovascular negative cardio ROS Normal cardiovascular exam Rhythm:Regular Rate:Normal  ECG: NSR, rate 91   Neuro/Psych PSYCHIATRIC DISORDERS Anxiety Depression Bipolar Disorder negative neurological ROS     GI/Hepatic GERD  Medicated and Controlled,(+)     substance abuse  ,   Endo/Other  negative endocrine ROS  Renal/GU negative Renal ROS     Musculoskeletal negative musculoskeletal ROS (+)   Abdominal (+) + obese,   Peds  Hematology negative hematology ROS (+)   Anesthesia Other Findings PARAESOPHAGEAL HERNIA  Reproductive/Obstetrics S/p BTL                           Anesthesia Physical Anesthesia Plan  ASA: III  Anesthesia Plan: General   Post-op Pain Management:    Induction: Intravenous and Rapid sequence  PONV Risk Score and Plan: 3 and Ondansetron, Dexamethasone, Midazolam and Treatment may vary due to age or medical condition  Airway Management Planned: Oral ETT  Additional Equipment:   Intra-op Plan:   Post-operative Plan: Extubation in OR  Informed Consent: I have reviewed the patients History and Physical, chart, labs and discussed the procedure including the risks, benefits and alternatives for the proposed anesthesia with the patient or authorized representative who has indicated his/her understanding and acceptance.     Dental advisory given  Plan Discussed with: CRNA  Anesthesia Plan Comments: (Patient unable to remove left nares and left ear  piercing )      Anesthesia Quick Evaluation

## 2019-10-10 ENCOUNTER — Observation Stay (HOSPITAL_COMMUNITY)
Admission: RE | Admit: 2019-10-10 | Discharge: 2019-10-11 | Disposition: A | Discharge status: Home / Self Care | Payer: Medicaid Other | Attending: Surgery | Admitting: Surgery

## 2019-10-10 ENCOUNTER — Other Ambulatory Visit: Payer: Self-pay

## 2019-10-10 ENCOUNTER — Encounter (HOSPITAL_COMMUNITY): Payer: Self-pay | Admitting: Surgery

## 2019-10-10 ENCOUNTER — Ambulatory Visit (HOSPITAL_COMMUNITY): Payer: Medicaid Other | Admitting: Anesthesiology

## 2019-10-10 ENCOUNTER — Ambulatory Visit (HOSPITAL_COMMUNITY): Payer: Medicaid Other | Admitting: Physician Assistant

## 2019-10-10 ENCOUNTER — Encounter (HOSPITAL_COMMUNITY)
Admission: RE | Disposition: A | Discharge status: Home / Self Care | Payer: Self-pay | Source: Home / Self Care | Attending: Surgery

## 2019-10-10 DIAGNOSIS — Z79899 Other long term (current) drug therapy: Secondary | ICD-10-CM | POA: Insufficient documentation

## 2019-10-10 DIAGNOSIS — K449 Diaphragmatic hernia without obstruction or gangrene: Secondary | ICD-10-CM | POA: Diagnosis present

## 2019-10-10 DIAGNOSIS — K219 Gastro-esophageal reflux disease without esophagitis: Secondary | ICD-10-CM | POA: Insufficient documentation

## 2019-10-10 DIAGNOSIS — Z9889 Other specified postprocedural states: Secondary | ICD-10-CM

## 2019-10-10 DIAGNOSIS — F314 Bipolar disorder, current episode depressed, severe, without psychotic features: Secondary | ICD-10-CM | POA: Insufficient documentation

## 2019-10-10 DIAGNOSIS — F419 Anxiety disorder, unspecified: Secondary | ICD-10-CM | POA: Insufficient documentation

## 2019-10-10 DIAGNOSIS — F17210 Nicotine dependence, cigarettes, uncomplicated: Secondary | ICD-10-CM | POA: Diagnosis not present

## 2019-10-10 DIAGNOSIS — J45909 Unspecified asthma, uncomplicated: Secondary | ICD-10-CM | POA: Diagnosis not present

## 2019-10-10 DIAGNOSIS — Z8719 Personal history of other diseases of the digestive system: Secondary | ICD-10-CM

## 2019-10-10 LAB — PREGNANCY, URINE: Preg Test, Ur: NEGATIVE

## 2019-10-10 SURGERY — REPAIR, HERNIA, PARAESOPHAGEAL, ROBOT-ASSISTED
Anesthesia: General | Site: Abdomen

## 2019-10-10 MED ORDER — CLONAZEPAM 0.5 MG PO TABS: 0.5000 mg | ORAL_TABLET | Freq: Every day | ORAL | Status: DC

## 2019-10-10 MED ORDER — LACTATED RINGERS IV SOLN: INTRAVENOUS | Status: DC

## 2019-10-10 MED ORDER — DEXAMETHASONE SODIUM PHOSPHATE 10 MG/ML IJ SOLN
INTRAMUSCULAR | Status: DC | PRN
  Administered 2019-10-10: 8 mg via INTRAVENOUS

## 2019-10-10 MED ORDER — ROCURONIUM BROMIDE 10 MG/ML (PF) SYRINGE
PREFILLED_SYRINGE | INTRAVENOUS | Status: DC | PRN
Start: 2019-10-10 — End: 2019-10-10
  Administered 2019-10-10: 10 mg via INTRAVENOUS
  Administered 2019-10-10: 30 mg via INTRAVENOUS
  Administered 2019-10-10 (×4): 20 mg via INTRAVENOUS

## 2019-10-10 MED ORDER — ROPINIROLE HCL 1 MG PO TABS
3.0000 mg | ORAL_TABLET | Freq: Every evening | ORAL | Status: DC
  Administered 2019-10-10: 3 mg via ORAL
  Filled 2019-10-10: qty 3

## 2019-10-10 MED ORDER — QUETIAPINE FUMARATE 50 MG PO TABS
300.0000 mg | ORAL_TABLET | Freq: Every day | ORAL | Status: DC
  Administered 2019-10-10: 300 mg via ORAL
  Filled 2019-10-10: qty 6

## 2019-10-10 MED ORDER — EPHEDRINE 5 MG/ML INJ
INTRAVENOUS | Status: AC
  Filled 2019-10-10: qty 10

## 2019-10-10 MED ORDER — PRIMIDONE 50 MG PO TABS
50.0000 mg | ORAL_TABLET | Freq: Two times a day (BID) | ORAL | Status: DC
  Administered 2019-10-10 – 2019-10-11 (×2): 50 mg via ORAL
  Filled 2019-10-10 (×3): qty 1

## 2019-10-10 MED ORDER — 0.9 % SODIUM CHLORIDE (POUR BTL) OPTIME
TOPICAL | Status: DC | PRN
  Administered 2019-10-10: 1000 mL

## 2019-10-10 MED ORDER — HYDROMORPHONE HCL 1 MG/ML IJ SOLN
INTRAMUSCULAR | Status: AC
  Filled 2019-10-10: qty 2

## 2019-10-10 MED ORDER — DIPHENHYDRAMINE HCL 50 MG/ML IJ SOLN: 25.0000 mg | Freq: Four times a day (QID) | INTRAMUSCULAR | Status: DC | PRN

## 2019-10-10 MED ORDER — HYDRALAZINE HCL 20 MG/ML IJ SOLN: 10.0000 mg | INTRAMUSCULAR | Status: DC | PRN

## 2019-10-10 MED ORDER — BUPIVACAINE LIPOSOME 1.3 % IJ SUSP
INTRAMUSCULAR | Status: DC | PRN
  Administered 2019-10-10: 20 mL

## 2019-10-10 MED ORDER — CHLORHEXIDINE GLUCONATE 4 % EX LIQD: 60.0000 mL | Freq: Once | CUTANEOUS | Status: DC

## 2019-10-10 MED ORDER — ACETAMINOPHEN 500 MG PO TABS
1000.0000 mg | ORAL_TABLET | ORAL | Status: AC
  Administered 2019-10-10: 1000 mg via ORAL
  Filled 2019-10-10: qty 2

## 2019-10-10 MED ORDER — MIDAZOLAM HCL 2 MG/2ML IJ SOLN
INTRAMUSCULAR | Status: AC
  Filled 2019-10-10: qty 2

## 2019-10-10 MED ORDER — HYDROMORPHONE HCL 1 MG/ML IJ SOLN
0.2500 mg | INTRAMUSCULAR | Status: DC | PRN
  Administered 2019-10-10 (×3): 0.5 mg via INTRAVENOUS

## 2019-10-10 MED ORDER — ONDANSETRON HCL 4 MG/2ML IJ SOLN
4.0000 mg | Freq: Four times a day (QID) | INTRAMUSCULAR | Status: DC | PRN
  Administered 2019-10-10: 4 mg via INTRAVENOUS
  Filled 2019-10-10: qty 2

## 2019-10-10 MED ORDER — PANTOPRAZOLE SODIUM 40 MG PO TBEC
40.0000 mg | DELAYED_RELEASE_TABLET | Freq: Every day | ORAL | Status: DC
  Administered 2019-10-11: 40 mg via ORAL
  Filled 2019-10-10: qty 1

## 2019-10-10 MED ORDER — MIDAZOLAM HCL 2 MG/2ML IJ SOLN
INTRAMUSCULAR | Status: DC | PRN
  Administered 2019-10-10: 2 mg via INTRAVENOUS

## 2019-10-10 MED ORDER — OXYCODONE HCL 5 MG PO TABS: 5.0000 mg | ORAL_TABLET | Freq: Once | ORAL | Status: DC | PRN

## 2019-10-10 MED ORDER — ROCURONIUM BROMIDE 10 MG/ML (PF) SYRINGE
PREFILLED_SYRINGE | INTRAVENOUS | Status: AC
  Filled 2019-10-10: qty 10

## 2019-10-10 MED ORDER — BUPIVACAINE HCL 0.25 % IJ SOLN
INTRAMUSCULAR | Status: AC
  Filled 2019-10-10: qty 1

## 2019-10-10 MED ORDER — CEFAZOLIN SODIUM-DEXTROSE 2-4 GM/100ML-% IV SOLN
2.0000 g | INTRAVENOUS | Status: AC
  Administered 2019-10-10: 2 g via INTRAVENOUS
  Filled 2019-10-10: qty 100

## 2019-10-10 MED ORDER — CLONAZEPAM 0.5 MG PO TABS
1.0000 mg | ORAL_TABLET | Freq: Two times a day (BID) | ORAL | Status: DC
  Administered 2019-10-10 – 2019-10-11 (×2): 1 mg via ORAL
  Filled 2019-10-10 (×2): qty 2

## 2019-10-10 MED ORDER — ONDANSETRON 4 MG PO TBDP: 4.0000 mg | ORAL_TABLET | Freq: Four times a day (QID) | ORAL | Status: DC | PRN

## 2019-10-10 MED ORDER — SODIUM CHLORIDE 0.9 % IV SOLN: INTRAVENOUS | Status: DC

## 2019-10-10 MED ORDER — METHOCARBAMOL 500 MG PO TABS
500.0000 mg | ORAL_TABLET | Freq: Four times a day (QID) | ORAL | Status: DC | PRN
  Administered 2019-10-10: 500 mg via ORAL
  Filled 2019-10-10: qty 1

## 2019-10-10 MED ORDER — ACETAMINOPHEN 500 MG PO TABS
1000.0000 mg | ORAL_TABLET | Freq: Four times a day (QID) | ORAL | Status: DC
  Administered 2019-10-10 – 2019-10-11 (×3): 1000 mg via ORAL
  Filled 2019-10-10 (×3): qty 2

## 2019-10-10 MED ORDER — FLUOXETINE HCL 20 MG PO CAPS
20.0000 mg | ORAL_CAPSULE | Freq: Every day | ORAL | Status: DC
  Administered 2019-10-11: 20 mg via ORAL
  Filled 2019-10-10: qty 1

## 2019-10-10 MED ORDER — TRAMADOL HCL 50 MG PO TABS
50.0000 mg | ORAL_TABLET | Freq: Four times a day (QID) | ORAL | Status: DC | PRN
  Administered 2019-10-11: 50 mg via ORAL
  Filled 2019-10-10 (×2): qty 1

## 2019-10-10 MED ORDER — GABAPENTIN 300 MG PO CAPS
300.0000 mg | ORAL_CAPSULE | Freq: Two times a day (BID) | ORAL | Status: DC
  Administered 2019-10-10: 300 mg via ORAL
  Filled 2019-10-10 (×2): qty 1

## 2019-10-10 MED ORDER — LIDOCAINE 2% (20 MG/ML) 5 ML SYRINGE
INTRAMUSCULAR | Status: AC
  Filled 2019-10-10: qty 5

## 2019-10-10 MED ORDER — ENOXAPARIN SODIUM 40 MG/0.4ML ~~LOC~~ SOLN
40.0000 mg | SUBCUTANEOUS | Status: DC
  Administered 2019-10-11: 40 mg via SUBCUTANEOUS
  Filled 2019-10-10: qty 0.4

## 2019-10-10 MED ORDER — FENTANYL CITRATE (PF) 250 MCG/5ML IJ SOLN
INTRAMUSCULAR | Status: DC | PRN
  Administered 2019-10-10: 50 ug via INTRAVENOUS
  Administered 2019-10-10: 100 ug via INTRAVENOUS
  Administered 2019-10-10 (×4): 50 ug via INTRAVENOUS

## 2019-10-10 MED ORDER — SUGAMMADEX SODIUM 200 MG/2ML IV SOLN
INTRAVENOUS | Status: DC | PRN
  Administered 2019-10-10: 400 mg via INTRAVENOUS

## 2019-10-10 MED ORDER — PROMETHAZINE HCL 25 MG/ML IJ SOLN: 6.2500 mg | INTRAMUSCULAR | Status: DC | PRN

## 2019-10-10 MED ORDER — FENTANYL CITRATE (PF) 100 MCG/2ML IJ SOLN
INTRAMUSCULAR | Status: AC
  Filled 2019-10-10: qty 2

## 2019-10-10 MED ORDER — DOCUSATE SODIUM 100 MG PO CAPS
100.0000 mg | ORAL_CAPSULE | Freq: Two times a day (BID) | ORAL | Status: DC
  Administered 2019-10-10 – 2019-10-11 (×2): 100 mg via ORAL
  Filled 2019-10-10 (×2): qty 1

## 2019-10-10 MED ORDER — FENTANYL CITRATE (PF) 250 MCG/5ML IJ SOLN
INTRAMUSCULAR | Status: AC
  Filled 2019-10-10: qty 5

## 2019-10-10 MED ORDER — SUCCINYLCHOLINE CHLORIDE 200 MG/10ML IV SOSY
PREFILLED_SYRINGE | INTRAVENOUS | Status: DC | PRN
  Administered 2019-10-10: 120 mg via INTRAVENOUS

## 2019-10-10 MED ORDER — PROPOFOL 10 MG/ML IV BOLUS
INTRAVENOUS | Status: AC
  Filled 2019-10-10: qty 20

## 2019-10-10 MED ORDER — TOPIRAMATE 25 MG PO TABS
50.0000 mg | ORAL_TABLET | Freq: Two times a day (BID) | ORAL | Status: DC
  Administered 2019-10-10 – 2019-10-11 (×2): 50 mg via ORAL
  Filled 2019-10-10 (×2): qty 2

## 2019-10-10 MED ORDER — BISACODYL 10 MG RE SUPP: 10.0000 mg | Freq: Every day | RECTAL | Status: DC | PRN

## 2019-10-10 MED ORDER — ONDANSETRON HCL 4 MG/2ML IJ SOLN
INTRAMUSCULAR | Status: DC | PRN
  Administered 2019-10-10: 4 mg via INTRAVENOUS

## 2019-10-10 MED ORDER — SODIUM CHLORIDE (PF) 0.9 % IJ SOLN
INTRAMUSCULAR | Status: AC
  Filled 2019-10-10: qty 10

## 2019-10-10 MED ORDER — VALACYCLOVIR HCL 500 MG PO TABS
500.0000 mg | ORAL_TABLET | Freq: Every day | ORAL | Status: DC
  Administered 2019-10-10 – 2019-10-11 (×2): 500 mg via ORAL
  Filled 2019-10-10 (×2): qty 1

## 2019-10-10 MED ORDER — METOPROLOL TARTRATE 5 MG/5ML IV SOLN: 5.0000 mg | Freq: Four times a day (QID) | INTRAVENOUS | Status: DC | PRN

## 2019-10-10 MED ORDER — METOCLOPRAMIDE HCL 5 MG/ML IJ SOLN
10.0000 mg | Freq: Four times a day (QID) | INTRAMUSCULAR | Status: DC
  Administered 2019-10-10 – 2019-10-11 (×2): 10 mg via INTRAVENOUS
  Filled 2019-10-10 (×2): qty 2

## 2019-10-10 MED ORDER — GABAPENTIN 300 MG PO CAPS
300.0000 mg | ORAL_CAPSULE | ORAL | Status: AC
  Administered 2019-10-10: 300 mg via ORAL
  Filled 2019-10-10: qty 1

## 2019-10-10 MED ORDER — ONDANSETRON HCL 4 MG/2ML IJ SOLN
INTRAMUSCULAR | Status: AC
  Filled 2019-10-10: qty 2

## 2019-10-10 MED ORDER — EPHEDRINE SULFATE-NACL 50-0.9 MG/10ML-% IV SOSY
PREFILLED_SYRINGE | INTRAVENOUS | Status: DC | PRN
  Administered 2019-10-10: 5 mg via INTRAVENOUS

## 2019-10-10 MED ORDER — LIDOCAINE 2% (20 MG/ML) 5 ML SYRINGE
INTRAMUSCULAR | Status: DC | PRN
  Administered 2019-10-10: 50 mg via INTRAVENOUS

## 2019-10-10 MED ORDER — BUPIVACAINE HCL (PF) 0.25 % IJ SOLN
INTRAMUSCULAR | Status: DC | PRN
  Administered 2019-10-10: 30 mL

## 2019-10-10 MED ORDER — DEXAMETHASONE SODIUM PHOSPHATE 10 MG/ML IJ SOLN
INTRAMUSCULAR | Status: AC
  Filled 2019-10-10: qty 1

## 2019-10-10 MED ORDER — HYDROMORPHONE HCL 1 MG/ML IJ SOLN
0.5000 mg | INTRAMUSCULAR | Status: DC | PRN
  Administered 2019-10-10 – 2019-10-11 (×3): 0.5 mg via INTRAVENOUS
  Filled 2019-10-10 (×3): qty 0.5

## 2019-10-10 MED ORDER — DIPHENHYDRAMINE HCL 25 MG PO CAPS: 25.0000 mg | ORAL_CAPSULE | Freq: Four times a day (QID) | ORAL | Status: DC | PRN

## 2019-10-10 MED ORDER — OXYCODONE HCL 5 MG/5ML PO SOLN: 5.0000 mg | Freq: Once | ORAL | Status: DC | PRN

## 2019-10-10 MED ORDER — PROPOFOL 10 MG/ML IV BOLUS
INTRAVENOUS | Status: DC | PRN
  Administered 2019-10-10: 160 mg via INTRAVENOUS

## 2019-10-10 MED ORDER — ALBUTEROL SULFATE (2.5 MG/3ML) 0.083% IN NEBU: 2.5000 mg | INHALATION_SOLUTION | Freq: Four times a day (QID) | RESPIRATORY_TRACT | Status: DC | PRN

## 2019-10-10 SURGICAL SUPPLY — 45 items
ADH SKN CLS APL DERMABOND .7 (GAUZE/BANDAGES/DRESSINGS) ×1
COVER SURGICAL LIGHT HANDLE (MISCELLANEOUS) ×3 IMPLANT
COVER TIP SHEARS 8 DVNC (MISCELLANEOUS) IMPLANT
COVER TIP SHEARS 8MM DA VINCI (MISCELLANEOUS) ×2
COVER WAND RF STERILE (DRAPES) IMPLANT
DECANTER SPIKE VIAL GLASS SM (MISCELLANEOUS) ×3 IMPLANT
DERMABOND ADVANCED (GAUZE/BANDAGES/DRESSINGS) ×2
DERMABOND ADVANCED .7 DNX12 (GAUZE/BANDAGES/DRESSINGS) ×1 IMPLANT
DRAIN PENROSE 0.5X18 (DRAIN) ×3 IMPLANT
DRAPE ARM DVNC X/XI (DISPOSABLE) IMPLANT
DRAPE CARDIOVASC SPLIT 84X147 (DRAPES) ×2 IMPLANT
DRAPE COLUMN DVNC XI (DISPOSABLE) IMPLANT
DRAPE DA VINCI XI ARM (DISPOSABLE) ×8
DRAPE DA VINCI XI COLUMN (DISPOSABLE) ×2
DRAPE LAPAROSCOPIC ABDOMINAL (DRAPES) IMPLANT
DRAPE PERI GROIN 82X75IN TIB (DRAPES) ×2 IMPLANT
ELECT REM PT RETURN 15FT ADLT (MISCELLANEOUS) ×3 IMPLANT
FELT TEFLON 4 X1 (Mesh General) ×3 IMPLANT
GLOVE BIO SURGEON STRL SZ 6 (GLOVE) ×9 IMPLANT
GLOVE BIOGEL PI IND STRL 6.5 (GLOVE) IMPLANT
GLOVE BIOGEL PI INDICATOR 6.5 (GLOVE) ×4
GLOVE ECLIPSE 8.0 STRL XLNG CF (GLOVE) ×2 IMPLANT
GLOVE INDICATOR 6.5 STRL GRN (GLOVE) ×5 IMPLANT
GLOVE INDICATOR 8.0 STRL GRN (GLOVE) ×2 IMPLANT
GLOVE SURG SS PI 7.0 STRL IVOR (GLOVE) ×4 IMPLANT
GOWN STRL REUS W/TWL LRG LVL3 (GOWN DISPOSABLE) ×7 IMPLANT
GOWN STRL REUS W/TWL XL LVL3 (GOWN DISPOSABLE) ×6 IMPLANT
KIT BASIN OR (CUSTOM PROCEDURE TRAY) ×3 IMPLANT
MARKER SKIN DUAL TIP RULER LAB (MISCELLANEOUS) ×3 IMPLANT
NDL INSUFFLATION 14GA 120MM (NEEDLE) IMPLANT
NEEDLE INSUFFLATION 14GA 120MM (NEEDLE) ×3 IMPLANT
PROTECTOR NERVE ULNAR (MISCELLANEOUS) ×4 IMPLANT
SEAL CANN UNIV 5-8 DVNC XI (MISCELLANEOUS) IMPLANT
SEAL XI 5MM-8MM UNIVERSAL (MISCELLANEOUS) ×8
SEALER VESSEL DA VINCI XI (MISCELLANEOUS) ×2
SEALER VESSEL EXT DVNC XI (MISCELLANEOUS) IMPLANT
SET IRRIG TUBING LAPAROSCOPIC (IRRIGATION / IRRIGATOR) ×3 IMPLANT
SUT ETHIBOND 0 36 GRN (SUTURE) ×10 IMPLANT
SUT MNCRL AB 4-0 PS2 18 (SUTURE) ×3 IMPLANT
TOWEL OR 17X26 10 PK STRL BLUE (TOWEL DISPOSABLE) ×3 IMPLANT
TOWEL OR NON WOVEN STRL DISP B (DISPOSABLE) ×3 IMPLANT
TRAY FOLEY MTR SLVR 14FR STAT (SET/KITS/TRAYS/PACK) ×2 IMPLANT
TRAY LAPAROSCOPIC (CUSTOM PROCEDURE TRAY) ×3 IMPLANT
TROCAR ADV FIXATION 5X100MM (TROCAR) ×2 IMPLANT
TUBING INSUFFLATION 10FT LAP (TUBING) ×2 IMPLANT

## 2019-10-10 NOTE — Transfer of Care (Signed)
Immediate Anesthesia Transfer of Care Note  Patient: [name]  Procedure(s) Performed: XI ROBOTIC ASSISTED PARAESOPHAGEAL HERNIA REPAIR WITH FUNDOPLICATION (N/A Abdomen)  Patient Location: PACU  Anesthesia Type:General  Level of Consciousness: awake, alert  and oriented  Airway & Oxygen Therapy: Patient Spontanous Breathing and Patient connected to face mask oxygen  Post-op Assessment: Report given to RN, Post -op Vital signs reviewed and stable and Patient moving all extremities X 4  Post vital signs: Reviewed and stable  Last Vitals:  Vitals Value Taken Time  BP 128/88 10/10/19 1105  Temp 36.7 C 10/10/19 1105  Pulse 101 10/10/19 1105  Resp 12 10/10/19 1105  SpO2 100 % 10/10/19 1105  Vitals shown include unvalidated device data.  Last Pain:  Vitals:   10/10/19 1105  TempSrc:   PainSc: 0-No pain         Complications: No apparent anesthesia complications

## 2019-10-10 NOTE — Anesthesia Procedure Notes (Signed)
Procedure Name: Intubation Date/Time: 10/10/2019 8:28 AM Performed by: Niel Hummer, CRNA Pre-anesthesia Checklist: Emergency Drugs available, Suction available, Patient being monitored and Patient identified Patient Re-evaluated:Patient Re-evaluated prior to induction Oxygen Delivery Method: Circle system utilized Preoxygenation: Pre-oxygenation with 100% oxygen Induction Type: IV induction and Rapid sequence Laryngoscope Size: Mac and 4 Grade View: Grade I Tube type: Oral Tube size: 7.0 mm Number of attempts: 1 Airway Equipment and Method: Stylet Placement Confirmation: ETT inserted through vocal cords under direct vision,  positive ETCO2 and breath sounds checked- equal and bilateral Secured at: 21 cm Tube secured with: Tape Dental Injury: Teeth and Oropharynx as per pre-operative assessment

## 2019-10-10 NOTE — Op Note (Signed)
Operative Note  ELEKTRA WARTMAN  [id number]  [id number]  10/10/2019   Surgeon: Tawanna Solo  Assistant: Estelle Grumbles MD  Procedure performed: [employer] Robotic hiatal hernia repair with toupet fundoplication  Preop diagnosis: type 1 hiatal hernia, reflux Post-op diagnosis/intraop findings: same  Specimens: none Retained items: none EBL: minimal cc Complications: none  Description of procedure: After confirming informed consent the patient was taken to the operating room and placed supine on operating room table wheregeneral endotracheal anesthesia was initiated, preoperative antibiotics were administered, SCDs applied, and a formal timeout was performed. The abdomen was prepped and draped in the usual sterile fashion. The peritoneal cavity was entered with a periumbilical veress needle and insufflated to . An 56mm trocar and camera were then placed. Gross inspection revealed no evidence of injury from entry. Bilateral TAPS blocks were performed under laparoscopic visualization using exparel mixed with marcaine. Under direct visualization, 3 additional 8 mm trochars and a right lateral 5 mm trocar were inserted. A subxiphoid incision was made and the liver retractor introduced for fixed retraction of the left lobe. The patient was then placed in steep reverse Trendelenburg.  The robot was docked and all robotic instruments inserted under direct visualization.    There was a large hiatal hernia with herniation of proximal stomach into the mediastinum.  Using blunt dissection and vessel sealer, the hernia sac was divided from the diaphragm circumferentially and reduced into the abdomen, allowing the stomach and distal esophagus to rest in the abdomen without tension.  The short gastric vessels were divided using the vessel sealer. The sac was excised using the vessel sealer, taking care to protect the underlying esophagus, vagus nerves and stomach.  The excised sac was discarded.   Mediastinal attachments of the esophagus were carefully divided with blunt dissection and vessel sealer to maximize intra-abdominal esophageal length.  We were able to achieve about 5 centimeters of intra-abdominal esophagus without tension.  Throughout our dissection, the vagus nerves were protected.  The mediastinum was inspected for hemostasis, which was excellent, and there was no injury to any mediastinal structure.  At this point the crura were reapproximated posteriorly with 6 simple interrupted 0 Ethibonds.  The posterior 3 sutures were reinforced with pledgets.  This narrowed the hiatus nicely to about 2.5 cm.   The fundus was brought posteriorly around the esophagus and a 270 degree wrap was performed with 3 simple interrupted 0 Ethibonds on either side.  The wrap was pexied to the diaphragm bilaterally with 0 Ethibond.  On completion, the esophagus and wrap rests within the abdomen without tension, the wrap is well positioned, and the field was hemostatic.  The robotic instruments were removed under direct visualization and the robot undocked.  The liver retractor was removed under direct visualization.  The abdomen was then desufflated and all trochars removed.  Skin incisions closed with subcuticular Monocryl and Dermabond.  The patient was then awakened, extubated and taken to the recovery room in stable condition.        All counts were correct at the completion of the case.

## 2019-10-10 NOTE — Anesthesia Postprocedure Evaluation (Signed)
Anesthesia Post Note  Patient: Turkessa L Balla  Procedure(s) Performed: XI ROBOTIC ASSISTED PARAESOPHAGEAL HERNIA REPAIR WITH FUNDOPLICATION (N/A Abdomen)     Patient location during evaluation: PACU Anesthesia Type: General Level of consciousness: awake and alert Pain management: pain level controlled Vital Signs Assessment: post-procedure vital signs reviewed and stable Respiratory status: spontaneous breathing, nonlabored ventilation, respiratory function stable and patient connected to nasal cannula oxygen Cardiovascular status: blood pressure returned to baseline and stable Postop Assessment: no apparent nausea or vomiting Anesthetic complications: no    Last Vitals:  Vitals:   10/10/19 1145 10/10/19 1204  BP: 127/89 129/85  Pulse: 79 72  Resp: 14 14  Temp: 36.7 C 36.8 C  SpO2: 97% 100%    Last Pain:  Vitals:   10/10/19 1204  TempSrc:   PainSc: 3                  Ryan P Ellender

## 2019-10-10 NOTE — H&P (Signed)
Surgical H&P  CC: multiple  HPI: 34 year old woman with multiple abdominal complaints including pain/discomfort, reflux, bloating, nausea and vomiting, hiatal hernia noted on multiple imaging studies, possible colitis vs underdistended colon on CT 01/2019, stool burden noted on xrays 03/2019. Despite poor appetite notes unintended weight gain. Symptoms unimproved with carafate and PPI.  She underwent laparoscopic cholecystectomy while [redacted]wks pregnant (son is 24, lives with his father). She has also had diagnostic laparoscopy with ablation of endometriosis and right oophorectomy for refractory pelvic pain  History otherwise notable for bipolar/anxiety/depression which she states is currently well-controlled. Lives at home with her boyfriend and grandmother. On disability due to mental illness. Smokes 4-5 cigarettes per day  UGI 06/25/19: moderate sized hiatal hernia, extensive gastroesophageal reflux, normal esophageal motility  Upper endoscopy July 13, 2019 Dr. Orvan Falconer: multiple localized erosions with no bleeding noted in gastric antrum. (Biopsies benign, H. Pylori negative), 1 nonbleeding cratered gastric ulcer 3 mm, no stigmata of bleeding (biopsies negative), medium hiatal hernia, duodenum normal      Allergies  Allergen Reactions  . Bee Venom Anaphylaxis and Swelling  . Coconut Flavor Anaphylaxis  . Ibuprofen Hives  . Naproxen Nausea And Vomiting       Past Medical History:  Diagnosis Date  . Anxiety   . Asthma   . Bipolar 1 disorder (HCC)   . Cataract    left eye  . Depression   . Endometriosis   . Mental disorder         Past Surgical History:  Procedure Laterality Date  . ABDOMINAL SURGERY     "For endometriosis"  . CHOLECYSTECTOMY    . OOPHORECTOMY Right 2012  . TUBAL LIGATION          Family History  Problem Relation Age of Onset  . Stroke Mother   . Hyperlipidemia Other   . Colon cancer Neg Hx   . Stomach cancer Neg Hx   . Pancreatic cancer Neg Hx   . Esophageal  cancer Neg Hx    Social History        Socioeconomic History  . Marital status: Legally Separated    Spouse name: Not on file  . Number of children: Not on file  . Years of education: Not on file  . Highest education level: Not on file  Occupational History  . Not on file  Tobacco Use  . Smoking status: Current Every Day Smoker    Types: Cigarettes  . Smokeless tobacco: Never Used  Substance and Sexual Activity  . Alcohol use: Never  . Drug use: Yes    Types: Marijuana, Barbituates    Comment: used weed 12 Jul 2019  . Sexual activity: Yes    Partners: Male    Birth control/protection: Surgical, Pill  Other Topics Concern  . Not on file  Social History Narrative  . Not on file   Social Determinants of Health      Financial Resource Strain:   . Difficulty of Paying Living Expenses: Not on file  Food Insecurity:   . Worried About Programme researcher, broadcasting/film/video in the Last Year: Not on file  . Ran Out of Food in the Last Year: Not on file  Transportation Needs:   . Lack of Transportation (Medical): Not on file  . Lack of Transportation (Non-Medical): Not on file  Physical Activity:   . Days of Exercise per Week: Not on file  . Minutes of Exercise per Session: Not on file  Stress:   . Feeling of  Stress : Not on file  Social Connections:   . Frequency of Communication with Friends and Family: Not on file  . Frequency of Social Gatherings with Friends and Family: Not on file  . Attends Religious Services: Not on file  . Active Member of Clubs or Organizations: Not on file  . Attends Banker Meetings: Not on file  . Marital Status: Not on file         Current Outpatient Medications on File Prior to Visit  Medication Sig Dispense Refill  . albuterol (PROVENTIL HFA;VENTOLIN HFA) 108 (90 BASE) MCG/ACT inhaler Inhale 1-2 puffs into the lungs every 6 (six) hours as needed for wheezing or shortness of breath.     Marland Kitchen CALCIUM-VITAMIN D PO Take 1 tablet by mouth daily.    .  clonazePAM (KLONOPIN) 1 MG tablet Take 1 tablet by mouth 3 (three) times daily as needed for anxiety.    . dicyclomine (BENTYL) 20 MG tablet Take 1 tablet (20 mg total) by mouth 4 (four) times daily - before meals and at bedtime. 120 tablet 3  . pantoprazole (PROTONIX) 40 MG tablet Take 1 tablet (40 mg total) by mouth daily. 30 tablet 3  . primidone (MYSOLINE) 50 MG tablet Take by mouth 4 (four) times daily.    . QUEtiapine (SEROQUEL) 200 MG tablet Take 200 mg by mouth at bedtime.    . SPRINTEC 28 0.25-35 MG-MCG tablet Take 1 tablet by mouth daily.    . sucralfate (CARAFATE) 1 g tablet Take 1 g by mouth 4 (four) times daily.     Marland Kitchen topiramate (TOPAMAX) 50 MG tablet Take by mouth.    . valACYclovir (VALTREX) 500 MG tablet Take 500 mg by mouth daily as needed (outbreak).     . [DISCONTINUED] gabapentin (NEURONTIN) 300 MG capsule Take 1 capsule (300 mg total) by mouth 3 (three) times daily. 90 capsule 2  . [DISCONTINUED] risperiDONE (RISPERDAL) 1 MG tablet Take 1 tablet (1 mg total) by mouth at bedtime. For mood control (Patient not taking: Reported on 03/28/2018) 30 tablet 0  . [DISCONTINUED] traZODone (DESYREL) 50 MG tablet Take 1 tablet (50 mg total) by mouth at bedtime as needed for sleep. 30 tablet 0   No current facility-administered medications on file prior to visit.   Review of Systems: a complete, 10pt review of systems was completed with pertinent positives and negatives as documented in the HPI  Physical Exam:  HR 131 97.35F sat 94% BMI 34.92 (197lb)  Gen: A&Ox3, no distress  Head: normocephalic, atraumatic  Eyes: extraocular motions intact, anicteric.  Chest: unlabored respirations, symmetrical air entry*  Cardiovascular: RRR with palpable distal pulses, no pedal edema  Abdomen: soft, nondistended, nontender. No mass or organomegaly. Multiple well healed laparoscopic sites  Extremities: warm, without edema, no deformities  Neuro: grossly intact  Psych: appropriate mood and flat  affect, normal insight  Skin: warm and dry  CBC Latest Ref Rng & Units 05/28/2019 04/12/2019 02/21/2019  WBC 4.0 - 10.5 K/uL 6.2 6.5 6.8  Hemoglobin 12.0 - 15.0 g/dL 48.2 15.1(H) 15.5(H)  Hematocrit 36.0 - 46.0 % 40.7 42.6 45.6  Platelets 150 - 400 K/uL 200 190 161   CMP Latest Ref Rng & Units 05/28/2019 02/21/2019 02/17/2019  Glucose 70 - 99 mg/dL 500(B) 96 704(U)  BUN 6 - 20 mg/dL 7 8 8   Creatinine 0.44 - 1.00 mg/dL 8.89 1.69  Sodium 135 - 145 mmol/L 138 138 138  Potassium 3.5 - 5.1 mmol/L 3.7 3.9  3.5  Chloride 98 - 111 mmol/L 106 105 105  CO2 22 - 32 mmol/L 22 27 24   Calcium 8.9 - 10.3 mg/dL 8.5(L) 9.3 9.2  Total Protein 6.5 - 8.1 g/dL - 7.4 7.6  Total Bilirubin 0.3 - 1.2 mg/dL - 0.6 0.6  Alkaline Phos 38 - 126 U/L - 57 66  AST 15 - 41 U/L - 19 17  ALT 0 - 44 U/L - 17 22   Recent Labs    Imaging:  Imaging Results (Last 48 hours)    A/P: Hiatal hernia. I had a long discussion with her and we went over the anatomy and surgical procedure of robotic hiatal hernia repair with Using a diagram to demonstrate. We discussed risks of bleeding, infection, pain, scarring, injury to intra-abdominal or mediastinal structures, hernia recurrence, dysphagia, gas bloat, and importantly in her scenario, failure to resolve all of her presenting symptoms. She may have a component of functional abdominal pain. Repairing the hiatal hernia will help with the reflux, but as to her abdominal pain, nausea, etc it is unclear if this will resolve, esp given findings of ulcer on endoscopy. Questions were welcomed and answered. She would like to pursue surgery.       Patient Active Problem List   Diagnosis Date Noted  . Bipolar 1 disorder, depressed, severe ([place]) 02/22/2019  . Severe recurrent major depression with psychotic features ([place]) 07/23/2017  . Substance use disorder 07/23/2017  . Pelvic pain in female 01/15/2015  . MDD (major depressive disorder), recurrent episode, severe ([place]) 03/11/2014  . MDD  (major depressive disorder) 03/11/2014  . Bipolar disorder with depression ([place]) 09/14/2013  . Major depressive disorder, recurrent episode, severe ([place]) 12/30/2011    Class: Stage 3  . Bipolar affective disorder, depressed, severe ([place]) 12/30/2011    Class: Acute   Romana Juniper, MD  [facility], [place]

## 2019-10-11 ENCOUNTER — Observation Stay (HOSPITAL_COMMUNITY): Payer: Medicaid Other

## 2019-10-11 DIAGNOSIS — K449 Diaphragmatic hernia without obstruction or gangrene: Secondary | ICD-10-CM | POA: Diagnosis not present

## 2019-10-11 LAB — CBC
HCT: 40 % (ref 36.0–46.0)
Hemoglobin: 12.9 g/dL (ref 12.0–15.0)
MCH: 31.7 pg (ref 26.0–34.0)
MCHC: 32.3 g/dL (ref 30.0–36.0)
MCV: 98.3 fL (ref 80.0–100.0)
Platelets: 206 10*3/uL (ref 150–400)
RBC: 4.07 MIL/uL (ref 3.87–5.11)
RDW: 13.1 % (ref 11.5–15.5)
WBC: 7.6 10*3/uL (ref 4.0–10.5)
nRBC: 0 % (ref 0.0–0.2)

## 2019-10-11 LAB — BASIC METABOLIC PANEL
Anion gap: 7 (ref 5–15)
BUN: <5 mg/dL — ABNORMAL LOW (ref 6–20)
CO2: 23 mmol/L (ref 22–32)
Calcium: 8.4 mg/dL — ABNORMAL LOW (ref 8.9–10.3)
Chloride: 113 mmol/L — ABNORMAL HIGH (ref 98–111)
Creatinine, Ser: 0.51 mg/dL (ref 0.44–1.00)
GFR calc Af Amer: >60 mL/min (ref >60)
GFR calc non Af Amer: >60 mL/min (ref >60)
Glucose, Bld: 105 mg/dL — ABNORMAL HIGH (ref 70–99)
Potassium: 3.6 mmol/L (ref 3.5–5.1)
Sodium: 143 mmol/L (ref 135–145)

## 2019-10-11 LAB — MAGNESIUM: Magnesium: 2 mg/dL (ref 1.7–2.4)

## 2019-10-11 MED ORDER — GABAPENTIN 300 MG PO CAPS: 300.0000 mg | ORAL_CAPSULE | Freq: Two times a day (BID) | ORAL | 0 refills | Status: DC

## 2019-10-11 MED ORDER — METHOCARBAMOL 500 MG PO TABS: 500.0000 mg | ORAL_TABLET | Freq: Four times a day (QID) | ORAL | 0 refills | Status: DC | PRN

## 2019-10-11 MED ORDER — DOCUSATE SODIUM 100 MG PO CAPS: 100.0000 mg | ORAL_CAPSULE | Freq: Two times a day (BID) | ORAL | 0 refills | Status: DC

## 2019-10-11 MED ORDER — ACETAMINOPHEN 500 MG PO TABS: 1000.0000 mg | ORAL_TABLET | Freq: Four times a day (QID) | ORAL | 0 refills | Status: DC

## 2019-10-11 MED ORDER — IOHEXOL 300 MG/ML  SOLN
150.0000 mL | Freq: Once | INTRAMUSCULAR | Status: AC | PRN
  Administered 2019-10-11: 80 mL via ORAL

## 2019-10-11 MED ORDER — TRAMADOL HCL 50 MG PO TABS: 50.0000 mg | ORAL_TABLET | Freq: Four times a day (QID) | ORAL | 0 refills | Status: DC | PRN

## 2019-10-11 NOTE — Discharge Instructions (Signed)
EATING AFTER YOUR ESOPHAGEAL SURGERY (Stomach Fundoplication, Hiatal Hernia repair, Achalasia surgery, etc)  ######################################################################  EAT Start with a pureed / full liquid diet (see below) Gradually transition to a high fiber diet with a fiber supplement over the next month after discharge.    WALK Walk an hour a day.  Control your pain to do that.    CONTROL PAIN Control pain so that you can walk, sleep, tolerate sneezing/coughing, go up/down stairs.  HAVE A BOWEL MOVEMENT DAILY Keep your bowels regular to avoid problems.  OK to try a laxative to override constipation.  OK to use an antidairrheal to slow down diarrhea.  Call if not better after 2 tries  CALL IF YOU HAVE PROBLEMS/CONCERNS Call if you are still struggling despite following these instructions. Call if you have concerns not answered by these instructions  ######################################################################   After your esophageal surgery, expect some sticking with swallowing over the next 1-2 months.    If food sticks when you eat, it is called "dysphagia".  This is due to swelling around your esophagus at the wrap & hiatal diaphragm repair.  It will gradually ease off over the next few months.  To help you through this temporary phase, we start you out on a pureed (blenderized) diet.  Your first meal in the hospital was thin liquids.  You should have been given a pureed diet by the time you left the hospital.  We ask patients to stay on a pureed diet for the first 2-3 weeks to avoid anything getting "stuck" near your recent surgery.  Don't be alarmed if your ability to swallow doesn't progress according to this plan.  Everyone is different and some diets can advance more or less quickly.    It is often helpful to crush your medications or split them as they can sometimes stick, especially the first week or so.   Some BASIC RULES to follow  are:  Maintain an upright position whenever eating or drinking.  Take small bites - just a teaspoon size bite at a time.  Eat slowly.  It may also help to eat only one food at a time.  Consider nibbling through smaller, more frequent meals & avoid the urge to eat BIG meals  Do not push through feelings of fullness, nausea, or bloatedness  Do not mix solid foods and liquids in the same mouthful  Try not to "wash foods down" with large gulps of liquids.  Avoid carbonated (bubbly/fizzy) drinks.    Avoid foods that make you feel gassy or bloated.  Start with bland foods first.  Wait on trying greasy, fried, or spicy meals until you are tolerating more bland solids well.  Understand that it will be hard to burp and belch at first.  This gradually improves with time.  Expect to be more gassy/flatulent/bloated initially.  Walking will help your body manage it better.  Consider using medications for bloating that contain simethicone such as  Maalox or Gas-X   Consider crushing her medications, especially smaller pills.  The ability to swallow pills should get easier after a few weeks  Eat in a relaxed atmosphere & minimize distractions.  Avoid talking while eating.    Do not use straws.  Following each meal, sit in an upright position (90 degree angle) for 60 to 90 minutes.  Going for a short walk can help as well  If food does stick, don't panic.  Try to relax and let the food pass on its own.    Sipping WARM LIQUID such as strong hot black tea can also help slide it down.   Be gradual in changes & use common sense:  -If you easily tolerating a certain "level" of foods, advance to the next level gradually -If you are having trouble swallowing a particular food, then avoid it.   -If food is sticking when you advance your diet, go back to thinner previous diet (the lower LEVEL) for 1-2 days.  LEVEL 1 = PUREED DIET  Do for the first 2 WEEKS AFTER SURGERY  -Foods in this group are  pureed or blenderized to a smooth, mashed potato-like consistency.  -If necessary, the pureed foods can keep their shape with the addition of a thickening agent.   -Meat should be pureed to a smooth, pasty consistency.  Hot broth or gravy may be added to the pureed meat, approximately 1 oz. of liquid per 3 oz. serving of meat. -CAUTION:  If any foods do not puree into a smooth consistency, swallowing will be more difficult.  (For example, nuts or seeds sometimes do not blend well.)  Hot Foods Cold Foods  Pureed scrambled eggs and cheese Pureed cottage cheese  Baby cereals Thickened juices and nectars  Thinned cooked cereals (no lumps) Thickened milk or eggnog  Pureed French toast or pancakes Ensure  Mashed potatoes Ice cream  Pureed parsley, au gratin, scalloped potatoes, candied sweet potatoes Fruit or Italian ice, sherbet  Pureed buttered or alfredo noodles Plain yogurt  Pureed vegetables (no corn or peas) Instant breakfast  Pureed soups and creamed soups Smooth pudding, mousse, custard  Pureed scalloped apples Whipped gelatin  Gravies Sugar, syrup, honey, jelly  Sauces, cheese, tomato, barbecue, white, creamed Cream  Any baby food Creamer  Alcohol in moderation (not beer or champagne) Margarine  Coffee or tea Mayonnaise   Ketchup, mustard   Apple sauce   SAMPLE MENU:  PUREED DIET Breakfast Lunch Dinner   Orange juice, 1/2 cup  Cream of wheat, 1/2 cup  Pineapple juice, 1/2 cup  Pureed turkey, barley soup, 3/4 cup  Pureed Hawaiian chicken, 3 oz   Scrambled eggs, mashed or blended with cheese, 1/2 cup  Tea or coffee, 1 cup   Whole milk, 1 cup   Non-dairy creamer, 2 Tbsp.  Mashed potatoes, 1/2 cup  Pureed cooled broccoli, 1/2 cup  Apple sauce, 1/2 cup  Coffee or tea  Mashed potatoes, 1/2 cup  Pureed spinach, 1/2 cup  Frozen yogurt, 1/2 cup  Tea or coffee      LEVEL 2 = SOFT DIET  After your first 2 weeks, you can advance to a soft diet.   Keep on this  diet until everything goes down easily.  Hot Foods Cold Foods  White fish Cottage cheese  Stuffed fish Junior baby fruit  Baby food meals Semi thickened juices  Minced soft cooked, scrambled, poached eggs nectars  Souffle & omelets Ripe mashed bananas  Cooked cereals Canned fruit, pineapple sauce, milk  potatoes Milkshake  Buttered or Alfredo noodles Custard  Cooked cooled vegetable Puddings, including tapioca  Sherbet Yogurt  Vegetable soup or alphabet soup Fruit ice, Italian ice  Gravies Whipped gelatin  Sugar, syrup, honey, jelly Junior baby desserts  Sauces:  Cheese, creamed, barbecue, tomato, white Cream  Coffee or tea Margarine   SAMPLE MENU:  LEVEL 2 Breakfast Lunch Dinner   Orange juice, 1/2 cup  Oatmeal, 1/2 cup  Scrambled eggs with cheese, 1/2 cup  Decaffeinated tea, 1 cup  Whole milk, 1 cup    Non-dairy creamer, 2 Tbsp  Pineapple juice, 1/2 cup  Minced beef, 3 oz  Gravy, 2 Tbsp  Mashed potatoes, 1/2 cup  Minced fresh broccoli, 1/2 cup  Applesauce, 1/2 cup  Coffee, 1 cup  Turkey, barley soup, 3/4 cup  Minced Hawaiian chicken, 3 oz  Mashed potatoes, 1/2 cup  Cooked spinach, 1/2 cup  Frozen yogurt, 1/2 cup  Non-dairy creamer, 2 Tbsp      LEVEL 3 = CHOPPED DIET  -After all the foods in level 2 (soft diet) are passing through well you should advance up to more chopped foods.  -It is still important to cut these foods into small pieces and eat slowly.  Hot Foods Cold Foods  Poultry Cottage cheese  Chopped Swedish meatballs Yogurt  Meat salads (ground or flaked meat) Milk  Flaked fish (tuna) Milkshakes  Poached or scrambled eggs Soft, cold, dry cereal  Souffles and omelets Fruit juices or nectars  Cooked cereals Chopped canned fruit  Chopped French toast or pancakes Canned fruit cocktail  Noodles or pasta (no rice) Pudding, mousse, custard  Cooked vegetables (no frozen peas, corn, or mixed vegetables) Green salad  Canned small sweet peas  Ice cream  Creamed soup or vegetable soup Fruit ice, Italian ice  Pureed vegetable soup or alphabet soup Non-dairy creamer  Ground scalloped apples Margarine  Gravies Mayonnaise  Sauces:  Cheese, creamed, barbecue, tomato, white Ketchup  Coffee or tea Mustard   SAMPLE MENU:  LEVEL 3 Breakfast Lunch Dinner   Orange juice, 1/2 cup  Oatmeal, 1/2 cup  Scrambled eggs with cheese, 1/2 cup  Decaffeinated tea, 1 cup  Whole milk, 1 cup  Non-dairy creamer, 2 Tbsp  Ketchup, 1 Tbsp  Margarine, 1 tsp  Salt, 1/4 tsp  Sugar, 2 tsp  Pineapple juice, 1/2 cup  Ground beef, 3 oz  Gravy, 2 Tbsp  Mashed potatoes, 1/2 cup  Cooked spinach, 1/2 cup  Applesauce, 1/2 cup  Decaffeinated coffee  Whole milk  Non-dairy creamer, 2 Tbsp  Margarine, 1 tsp  Salt, 1/4 tsp  Pureed turkey, barley soup, 3/4 cup  Barbecue chicken, 3 oz  Mashed potatoes, 1/2 cup  Ground fresh broccoli, 1/2 cup  Frozen yogurt, 1/2 cup  Decaffeinated tea, 1 cup  Non-dairy creamer, 2 Tbsp  Margarine, 1 tsp  Salt, 1/4 tsp  Sugar, 1 tsp    LEVEL 4:  REGULAR FOODS  -Foods in this group are soft, moist, regularly textured foods.   -This level includes meat and breads, which tend to be the hardest things to swallow.   -Eat very slowly, chew well and continue to avoid carbonated drinks. -most people are at this level in 4-6 weeks  Hot Foods Cold Foods  Baked fish or skinned Soft cheeses - cottage cheese  Souffles and omelets Cream cheese  Eggs Yogurt  Stuffed shells Milk  Spaghetti with meat sauce Milkshakes  Cooked cereal Cold dry cereals (no nuts, dried fruit, coconut)  French toast or pancakes Crackers  Buttered toast Fruit juices or nectars  Noodles or pasta (no rice) Canned fruit  Potatoes (all types) Ripe bananas  Soft, cooked vegetables (no corn, lima, or baked beans) Peeled, ripe, fresh fruit  Creamed soups or vegetable soup Cakes (no nuts, dried fruit, coconut)  Canned chicken  noodle soup Plain doughnuts  Gravies Ice cream  Bacon dressing Pudding, mousse, custard  Sauces:  Cheese, creamed, barbecue, tomato, white Fruit ice, Italian ice, sherbet  Decaffeinated tea or coffee Whipped gelatin  Pork chops Regular gelatin     Canned fruited gelatin molds   Sugar, syrup, honey, jam, jelly   Cream   Non-dairy   Margarine   Oil   Mayonnaise   Ketchup   Mustard   TROUBLESHOOTING IRREGULAR BOWELS  1) Avoid extremes of bowel movements (no bad constipation/diarrhea)  2) Miralax 17gm mixed in 8oz. water or juice-daily. May use BID as needed.  3) Gas-x,Phazyme, etc. as needed for gas & bloating.  4) Soft,bland diet. No spicy,greasy,fried foods.  5) Prilosec over-the-counter as needed  6) May hold gluten/wheat products from diet to see if symptoms improve.  7) May try probiotics (Align, Activa, etc) to help calm the bowels down  7) If symptoms become worse call back immediately.    If you have any questions please call our office at [facility]: [phone].  

## 2019-10-11 NOTE — Discharge Summary (Signed)
Physician Discharge Summary  Patient ID: [name] MRN: [id number] DOB/AGE: 1986-05-30 34 y.o.  Admit date: 10/10/2019 Discharge date: 10/11/2019  Admission Diagnoses: hiatal hernia, reflux  Discharge Diagnoses:  Active Problems:   Hiatal hernia   Discharged Condition: good  Hospital Course: admitted for observation and standard supportive care following robotic paraesophageal hernia repair with toupee fundoplication.  On the morning of postop day 1 she was tolerating liquids without dysphagia and pain was well controlled.  She was ambulating independently within her room.  Voiding well.  Vital signs have been stable and morning labs unremarkable.  She underwent an upper GI on postop day 1 showing appropriate position and narrowing of the GE junction. She was advanced to pured diet which she tolerated.  She was deemed stable for discharge home.  Consults: None  Significant Diagnostic Studies: labs, UGI  Treatments: surgery as above  Discharge Exam: Blood pressure 118/87, pulse 73, temperature 97.9 F (36.6 C), temperature source Oral, resp. rate 16, last menstrual period 10/02/2019, SpO2 98 %. see rounding note  Disposition: Discharge disposition: 01-Home or Self Care       Discharge Instructions     Driving Restrictions   Complete by: As directed    No driving while on pain medications   Lifting restrictions   Complete by: As directed    No lifting more than 20 pounds, strenuous activity or straining for at least 6 weeks.  Walking and light cardio is encouraged.   No dressing needed   Complete by: As directed    Okay to shower and pat dry.  We will take off after 2 weeks.  No rubbing, scrubbing, lotions or ointments to incisions.  No soaking, swimming or submerging for at least 2 weeks.      Allergies as of 10/11/2019       Reactions   Bee Venom Anaphylaxis, Swelling   Coconut Flavor Anaphylaxis   Ibuprofen Hives   Naproxen Nausea And Vomiting         Medication List     TAKE these medications    acetaminophen 500 MG tablet Commonly known as: TYLENOL Take 2 tablets (1,000 mg total) by mouth every 6 (six) hours. Take Tylenol around-the-clock for the first few days after surgery, then switch to taking as needed   albuterol 108 (90 Base) MCG/ACT inhaler Commonly known as: VENTOLIN HFA Inhale 1-2 puffs into the lungs every 6 (six) hours as needed for wheezing or shortness of breath.   CALCIUM-VITAMIN D PO Take 1 tablet by mouth daily.   cholecalciferol 25 MCG (1000 UNIT) tablet Commonly known as: VITAMIN D3 Take 1,000 Units by mouth daily.   clonazePAM 1 MG tablet Commonly known as: KLONOPIN Take 0.5-1 tablets by mouth See admin instructions. Take 1 mg by mouth twice daily and 0.5 mg once daily   dicyclomine 20 MG tablet Commonly known as: BENTYL Take 1 tablet (20 mg total) by mouth 4 (four) times daily -  before meals and at bedtime.   docusate sodium 100 MG capsule Commonly known as: COLACE Take 1 capsule (100 mg total) by mouth 2 (two) times daily. Okay to decrease to once a day or discontinue if having loose bowel movements   FLUoxetine 20 MG capsule Commonly known as: PROZAC Take 20 mg by mouth daily.   gabapentin 300 MG capsule Commonly known as: NEURONTIN Take 1 capsule (300 mg total) by mouth 2 (two) times daily.   methocarbamol 500 MG tablet Commonly known as: ROBAXIN Take 1  tablet (500 mg total) by mouth every 6 (six) hours as needed for muscle spasms.   pantoprazole 40 MG tablet Commonly known as: PROTONIX Take 1 tablet (40 mg total) by mouth daily.   primidone 50 MG tablet Commonly known as: MYSOLINE Take 50 mg by mouth 2 (two) times daily.   QUEtiapine 300 MG tablet Commonly known as: SEROQUEL Take 300 mg by mouth at bedtime.   rOPINIRole 3 MG tablet Commonly known as: REQUIP Take 3 mg by mouth every evening.   Sprintec 28 0.25-35 MG-MCG tablet Generic drug: norgestimate-ethinyl  estradiol Take 1 tablet by mouth daily.   sucralfate 1 g tablet Commonly known as: CARAFATE Take 1 g by mouth 4 (four) times daily -  with meals and at bedtime.   topiramate 50 MG tablet Commonly known as: TOPAMAX Take 50 mg by mouth 2 (two) times daily.   traMADol 50 MG tablet Commonly known as: ULTRAM Take 1 tablet (50 mg total) by mouth every 6 (six) hours as needed (mild pain).   valACYclovir 500 MG tablet Commonly known as: VALTREX Take 500 mg by mouth daily.       Follow-up Information     Berna Bue, MD In 3 weeks.   Specialty: General Surgery Contact information: [address] [place] [address] [phone]            Signed: Berna Bue 10/11/2019, 8:43 AM

## 2019-10-11 NOTE — Progress Notes (Signed)
No acute issues overnight.  Reports soreness in the abdomen from the incisions.  Tolerating liquids without dysphagia, some nausea.  Vitals, labs, intake/output, and orders reviewed at this time.  She is alert and oriented Unlabored respirations Abdomen soft, minimally appropriate tender.  Incisions are all clean, dry and intact with Dermabond, no cellulitis or hematoma  Lines/tubes/drains: PIV  A/P:  Post op day 1 robotic paraesophageal hernia repair with toupet fundoplication pure diet Ambulate Upper GI this morning  Discharge today.   Phylliss Blakes, MD [facility], [place] Pager [phone]

## 2019-10-15 ENCOUNTER — Other Ambulatory Visit: Payer: Self-pay | Admitting: Gastroenterology

## 2019-11-20 ENCOUNTER — Other Ambulatory Visit: Payer: Self-pay

## 2019-11-20 ENCOUNTER — Emergency Department (HOSPITAL_COMMUNITY): Payer: Medicaid Other

## 2019-11-20 ENCOUNTER — Emergency Department (HOSPITAL_COMMUNITY)
Admission: EM | Admit: 2019-11-20 | Discharge: 2019-11-20 | Disposition: A | Discharge status: Home / Self Care | Payer: Medicaid Other | Attending: Emergency Medicine | Admitting: Emergency Medicine

## 2019-11-20 DIAGNOSIS — F17210 Nicotine dependence, cigarettes, uncomplicated: Secondary | ICD-10-CM | POA: Insufficient documentation

## 2019-11-20 DIAGNOSIS — Z79899 Other long term (current) drug therapy: Secondary | ICD-10-CM | POA: Diagnosis not present

## 2019-11-20 DIAGNOSIS — F1210 Cannabis abuse, uncomplicated: Secondary | ICD-10-CM | POA: Diagnosis not present

## 2019-11-20 DIAGNOSIS — F1310 Sedative, hypnotic or anxiolytic abuse, uncomplicated: Secondary | ICD-10-CM | POA: Insufficient documentation

## 2019-11-20 DIAGNOSIS — J45909 Unspecified asthma, uncomplicated: Secondary | ICD-10-CM | POA: Insufficient documentation

## 2019-11-20 DIAGNOSIS — M542 Cervicalgia: Secondary | ICD-10-CM | POA: Diagnosis present

## 2019-11-20 MED ORDER — DIAZEPAM 5 MG PO TABS
10.0000 mg | ORAL_TABLET | Freq: Once | ORAL | Status: AC
  Administered 2019-11-20: 10 mg via ORAL
  Filled 2019-11-20: qty 2

## 2019-11-20 MED ORDER — LIDOCAINE 5 % EX PTCH
1.0000 | MEDICATED_PATCH | CUTANEOUS | Status: DC
  Administered 2019-11-20: 1 via TRANSDERMAL
  Filled 2019-11-20: qty 1

## 2019-11-20 MED ORDER — HYDROCODONE-ACETAMINOPHEN 5-325 MG PO TABS
1.0000 | ORAL_TABLET | Freq: Once | ORAL | Status: AC
  Administered 2019-11-20: 1 via ORAL
  Filled 2019-11-20: qty 1

## 2019-11-20 MED ORDER — METHOCARBAMOL 1000 MG/10ML IJ SOLN
500.0000 mg | Freq: Once | INTRAMUSCULAR | Status: AC
  Administered 2019-11-20: 500 mg via INTRAMUSCULAR
  Filled 2019-11-20: qty 5

## 2019-11-20 MED ORDER — DICLOFENAC SODIUM 1 % EX GEL: 2.0000 g | Freq: Four times a day (QID) | CUTANEOUS | 0 refills | Status: DC

## 2019-11-20 MED ORDER — METHOCARBAMOL 500 MG PO TABS: 500.0000 mg | ORAL_TABLET | Freq: Two times a day (BID) | ORAL | 0 refills | Status: DC

## 2019-11-20 NOTE — ED Notes (Signed)
Assumed care of pt. Pt alert, resting on cart in NAD. Breathing easy, non-labored. Equal rise and fall of chest noted.

## 2019-11-20 NOTE — ED Triage Notes (Signed)
Pt here from home for evaluation of neck pain, ongoing, but worse since last night. Not related to injury.

## 2019-11-20 NOTE — ED Provider Notes (Signed)
[facility] EMERGENCY DEPARTMENT Provider Note   CSN: [id number] Arrival date & time: 11/20/19  1217     History Chief Complaint  Patient presents with  . Neck Pain    [name] is a 34 y.o. female.  HPI  34 year old female with a history of major depressive disorder, bipolar disorder, substance use disorder resents to the ER with a 1 week history of right-sided neck pain that has gradually been getting worse.  She states that her pain got very severe last night.  Reports having to wake up in the middle of the night from pain, and manually repositioning her head to alleviate the pain.  She has tried heating pad, ice pack, Tylenol, she is allergic to NSAIDs.  She cannot describe the pain she states it "hurts so bad" and rates the pain a 15/10.  She states her pain is tender to palpation.  She cannot recall an inciting event, denies any trauma.  No vision changes, fevers, sensitivity to light, nausea, vomiting, headaches (she reports taking topomax).  She reports a similar episode of neck pain approximately a month ago but it lasted only 2 days and went away on its own.  States the pain was nowhere near as severe during the last episode.  Denies use of blood thinner, IVDA.  Past Medical History:  Diagnosis Date  . Anxiety   . Asthma   . Bipolar 1 disorder (HCC)   . Cataract    left eye  . Depression   . Endometriosis   . GERD (gastroesophageal reflux disease)   . Mental disorder     Patient Active Problem List   Diagnosis Date Noted  . Hiatal hernia 10/10/2019  . Bipolar 1 disorder, depressed, severe (HCC) 02/22/2019  . Severe recurrent major depression with psychotic features (HCC) 07/23/2017  . Substance use disorder 07/23/2017  . Pelvic pain in female 01/15/2015  . MDD (major depressive disorder), recurrent episode, severe (HCC) 03/11/2014  . MDD (major depressive disorder) 03/11/2014  . Bipolar disorder with depression (HCC) 09/14/2013  . Major  depressive disorder, recurrent episode, severe (HCC) 12/30/2011    Class: Stage 3  . Bipolar affective disorder, depressed, severe (HCC) 12/30/2011    Class: Acute    Past Surgical History:  Procedure Laterality Date  . ABDOMINAL SURGERY     "For endometriosis"  . CHOLECYSTECTOMY    . OOPHORECTOMY Right 2012  . TUBAL LIGATION       OB History    Gravida  4   Para  2   Term  2   Preterm  0   AB  2   Living  3     SAB  2   TAB  0   Ectopic  0   Multiple  0   Live Births  1           Family History  Problem Relation Age of Onset  . Stroke Mother   . Hyperlipidemia Other   . Colon cancer Neg Hx   . Stomach cancer Neg Hx   . Pancreatic cancer Neg Hx   . Esophageal cancer Neg Hx     Social History   Tobacco Use  . Smoking status: Current Every Day Smoker    Packs/day: 0.25    Years: 5.00    Pack years: 1.25    Types: Cigarettes  . Smokeless tobacco: Never Used  Substance Use Topics  . Alcohol use: Never  . Drug use: Yes  Types: Marijuana, Barbituates    Comment: used weed 12 Jul 2019    Home Medications Prior to Admission medications   Medication Sig Start Date End Date Taking? Authorizing Provider  acetaminophen (TYLENOL) 500 MG tablet Take 2 tablets (1,000 mg total) by mouth every 6 (six) hours. Take Tylenol around-the-clock for the first few days after surgery, then switch to taking as needed 10/11/19   Clovis Riley, MD  albuterol (PROVENTIL HFA;VENTOLIN HFA) 108 (90 BASE) MCG/ACT inhaler Inhale 1-2 puffs into the lungs every 6 (six) hours as needed for wheezing or shortness of breath.     [provider]  CALCIUM-VITAMIN D PO Take 1 tablet by mouth daily.     [provider]  cholecalciferol (VITAMIN D3) 25 MCG (1000 UNIT) tablet Take 1,000 Units by mouth daily.    [provider]  clonazePAM (KLONOPIN) 1 MG tablet Take 0.5-1 tablets by mouth See admin instructions. Take 1 mg by mouth twice daily and 0.5 mg  once daily 05/21/19   [provider]  diclofenac Sodium (VOLTAREN) 1 % GEL Apply 2 g topically 4 (four) times daily. 11/20/19   Garald Balding, PA-C  dicyclomine (BENTYL) 20 MG tablet Take 1 tablet (20 mg total) by mouth 4 (four) times daily -  before meals and at bedtime. 08/14/19   Thornton Park, MD  docusate sodium (COLACE) 100 MG capsule Take 1 capsule (100 mg total) by mouth 2 (two) times daily. Okay to decrease to once a day or discontinue if having loose bowel movements 10/11/19   Clovis Riley, MD  FLUoxetine (PROZAC) 20 MG capsule Take 20 mg by mouth daily. 09/06/19   [provider]  gabapentin (NEURONTIN) 300 MG capsule Take 1 capsule (300 mg total) by mouth 2 (two) times daily. 10/11/19   Clovis Riley, MD  methocarbamol (ROBAXIN) 500 MG tablet Take 1 tablet (500 mg total) by mouth every 6 (six) hours as needed for muscle spasms. 10/11/19   Clovis Riley, MD  methocarbamol (ROBAXIN) 500 MG tablet Take 1 tablet (500 mg total) by mouth 2 (two) times daily. 11/20/19   Garald Balding, PA-C  pantoprazole (PROTONIX) 40 MG tablet TAKE 1 TABLET BY MOUTH EVERY DAY 10/15/19   Thornton Park, MD  primidone (MYSOLINE) 50 MG tablet Take 50 mg by mouth 2 (two) times daily.     [provider]  QUEtiapine (SEROQUEL) 300 MG tablet Take 300 mg by mouth at bedtime.     [provider]  rOPINIRole (REQUIP) 3 MG tablet Take 3 mg by mouth every evening.  07/16/19   [provider]  Tensas 28 0.25-35 MG-MCG tablet Take 1 tablet by mouth daily. 03/26/19   [provider]  sucralfate (CARAFATE) 1 g tablet Take 1 g by mouth 4 (four) times daily -  with meals and at bedtime.  10/26/18   [provider]  topiramate (TOPAMAX) 50 MG tablet Take 50 mg by mouth 2 (two) times daily.  07/04/19   [provider]  traMADol (ULTRAM) 50 MG tablet Take 1 tablet (50 mg total) by mouth every 6 (six) hours as needed (mild pain). 10/11/19   Clovis Riley, MD  valACYclovir (VALTREX) 500 MG tablet Take 500 mg by mouth daily.     [provider]  risperiDONE (RISPERDAL) 1 MG tablet Take 1 tablet (1 mg total) by mouth at bedtime. For mood control Patient not taking: Reported on 03/28/2018 07/26/17 02/17/19  Money, Lowry Ram,  FNP  traZODone (DESYREL) 50 MG tablet Take 1 tablet (50 mg total) by mouth at bedtime as needed for sleep. 07/26/17 06/18/19  Money, Gerlene Burdock, FNP    Allergies    Bee venom, Coconut flavor, Ibuprofen, Naproxen, and Tramadol  Review of Systems   Review of Systems  Constitutional: Negative for appetite change, chills, fatigue and fever.  HENT: Negative for ear pain, nosebleeds, sore throat and tinnitus.   Eyes: Negative for pain, discharge and visual disturbance.  Respiratory: Negative for cough and shortness of breath.   Cardiovascular: Negative for chest pain and palpitations.  Gastrointestinal: Negative for abdominal pain, nausea and vomiting.  Genitourinary: Negative for dysuria and hematuria.  Musculoskeletal: Positive for neck pain and neck stiffness. Negative for arthralgias and back pain.  Skin: Negative for color change and rash.  Neurological: Negative for seizures and syncope.  Psychiatric/Behavioral: Negative.   All other systems reviewed and are negative.   Physical Exam Updated Vital Signs BP 123/83   Pulse 96   Temp 98.2 F (36.8 C) (Oral)   Resp 16   Ht 5\' 2"  (1.575 m)   SpO2 100%   BMI 35.21 kg/m   Physical Exam Vitals and nursing note reviewed.  Constitutional:      General: She is not in acute distress.    Appearance: She is well-developed.  HENT:     Head: Normocephalic and atraumatic.  Eyes:     Conjunctiva/sclera: Conjunctivae normal.  Cardiovascular:     Rate and Rhythm: Normal rate and regular rhythm.     Heart sounds: No murmur.  Pulmonary:     Effort: Pulmonary effort is normal. No respiratory distress.     Breath sounds: Normal breath sounds.  Abdominal:      Palpations: Abdomen is soft.     Tenderness: There is no abdominal tenderness.  Musculoskeletal:        General: Tenderness present. No swelling, deformity or signs of injury.     Cervical back: Neck supple.     Right lower leg: No edema.     Left lower leg: No edema.     Comments: tender to palpation over right paraspinal neck muscles.  No tenderness over left paraspinal muscles.  No midline tenderness.  Decreased range of motion due to pain.  No focal neuro deficits.  Negative Brudzinski's  Skin:    General: Skin is warm and dry.     Findings: No bruising, erythema, lesion or rash.     Comments: No rashes, bruising, signs of trauma on neck  Neurological:     General: No focal deficit present.     Mental Status: She is alert and oriented to person, place, and time.     Comments: No meningismus, pt alert and able to follow commands     ED Results / Procedures / Treatments   Labs (all labs ordered are listed, but only abnormal results are displayed) Labs Reviewed - No data to display  EKG None  Radiology CT Cervical Spine Wo Contrast  Result Date: 11/20/2019 CLINICAL DATA:  Acute neck pain without known injury. EXAM: CT CERVICAL SPINE WITHOUT CONTRAST TECHNIQUE: Multidetector CT imaging of the cervical spine was performed without intravenous contrast. Multiplanar CT image reconstructions were also generated. COMPARISON:  None. FINDINGS: Alignment: Normal. Skull base and vertebrae: No acute fracture. No primary bone lesion or focal pathologic process. Soft tissues and spinal canal: No prevertebral fluid or swelling. No visible canal hematoma. Disc levels:  Normal. Upper chest: Negative. Other: None.  IMPRESSION: Normal cervical spine. Electronically Signed   By: Lupita Raider M.D.   On: 11/20/2019 16:36    Procedures Procedures (including critical care time)  Medications Ordered in ED Medications  lidocaine (LIDODERM) 5 % 1 patch (has no administration in time range)    methocarbamol (ROBAXIN) injection 500 mg (500 mg Intramuscular Given 11/20/19 1438)  diazepam (VALIUM) tablet 10 mg (10 mg Oral Given 11/20/19 1601)  HYDROcodone-acetaminophen (NORCO/VICODIN) 5-325 MG per tablet 1 tablet (1 tablet Oral Given 11/20/19 1601)    ED Course  I have reviewed the triage vital signs and the nursing notes.  Pertinent labs & imaging results that were available during my care of the patient were reviewed by me and considered in my medical decision making (see chart for details).    MDM Rules/Calculators/A&P                     34 year old female presents to the ER with 1 week history of neck pain -Vitals nonconcerning on arrival to the ED.  Patient does look uncomfortable in ER bed.  Patient denies any vision changes, neurological deficits, headaches, fevers, nausea, vomiting, severe neck stiffness, night sweats.Pt w/ h/o substance abuse, states she only uses marijuana, denies IVDA. - Given no midline tenderness or known trauma I do not think imaging is indicated at this time - Pt given IM Robaxin in ED, reports significant  -Low suspicion for disc herniation, fractures, dissection - CT scan negative. No fever, night sweats, weight loss, h/o cancer, IVDU.  RICE protocol and pain medicine indicated and discussed with patient. Upon re-eval pt notes moderate improvement in her pain. - Robaxin, voltaren gel, OTC lidocaine patches, ortho followup.  - At this point the patient is stable for discharge. Return precautions given  Final Clinical Impression(s) / ED Diagnoses Final diagnoses:  Neck pain    Rx / DC Orders ED Discharge Orders         Ordered    diclofenac Sodium (VOLTAREN) 1 % GEL  4 times daily     11/20/19 1648    methocarbamol (ROBAXIN) 500 MG tablet  2 times daily     11/20/19 1652           Leone Brand 11/20/19 1659    Eber Hong, MD 11/21/19 [phone]

## 2019-11-20 NOTE — Discharge Instructions (Addendum)
You were seen in the ER for neck pain.  You were given a muscle relaxer and some pain medication in the ER.  CT of your neck was normal.   We referred you to orthopedic clinic for further follow-up and evaluation of your neck pain.  Use over-the-counter lidocaine patches and prescription Voltaren gel which is an anti-inflammatory.  You were prescribed Robaxin which is a muscle relaxer.  Continue to use ice/heat for your pain.  Return to the ER if your symptoms worsen

## 2019-11-20 NOTE — ED Notes (Signed)
Patient verbalizes understanding of discharge instructions, prescription medications, and follow up care. Opportunity for questioning and answers were provided. All questions answered completely. Armband removed by staff, pt discharged from ED. Ambulatory from ED 

## 2019-11-20 NOTE — ED Notes (Signed)
Meds given per MAR. Name/DOB verified with pt 

## 2019-11-23 ENCOUNTER — Telehealth: Payer: Self-pay | Admitting: Surgery

## 2019-11-23 NOTE — Telephone Encounter (Signed)
ED CM attempted to contact patient at number listed in record no answer no VM left.  CM will make 2nd attempt later this evening.

## 2019-12-11 ENCOUNTER — Telehealth: Payer: Self-pay | Admitting: Gastroenterology

## 2019-12-11 NOTE — Telephone Encounter (Signed)
I am delighted to hear that she is feeling better and agree with your recommendations. Thank you.

## 2019-12-11 NOTE — Telephone Encounter (Signed)
Patient wanted to know if she could stop dicyclomine she states since her hernia surgery she is not having any more abdominal pain. I informed her that she could stop it but to call for an appointment if she has abdominal pain. She states Pantoprazole is working great and she still has some refills.

## 2019-12-11 NOTE — Telephone Encounter (Signed)
Pt requested a refill for dicyclomine and inquired whether she still needs to take pantoprazole.

## 2020-01-08 ENCOUNTER — Emergency Department (HOSPITAL_COMMUNITY): Payer: Medicaid Other

## 2020-01-08 ENCOUNTER — Encounter (HOSPITAL_COMMUNITY): Payer: Self-pay | Admitting: Emergency Medicine

## 2020-01-08 ENCOUNTER — Emergency Department (HOSPITAL_COMMUNITY)
Admission: EM | Admit: 2020-01-08 | Discharge: 2020-01-08 | Disposition: A | Discharge status: Home / Self Care | Payer: Medicaid Other | Attending: Emergency Medicine | Admitting: Emergency Medicine

## 2020-01-08 DIAGNOSIS — R0789 Other chest pain: Secondary | ICD-10-CM | POA: Diagnosis not present

## 2020-01-08 DIAGNOSIS — F17210 Nicotine dependence, cigarettes, uncomplicated: Secondary | ICD-10-CM | POA: Diagnosis not present

## 2020-01-08 DIAGNOSIS — R079 Chest pain, unspecified: Secondary | ICD-10-CM

## 2020-01-08 DIAGNOSIS — G43809 Other migraine, not intractable, without status migrainosus: Secondary | ICD-10-CM | POA: Insufficient documentation

## 2020-01-08 DIAGNOSIS — J45909 Unspecified asthma, uncomplicated: Secondary | ICD-10-CM | POA: Diagnosis not present

## 2020-01-08 LAB — CBC
HCT: 45.6 % (ref 36.0–46.0)
Hemoglobin: 15.4 g/dL — ABNORMAL HIGH (ref 12.0–15.0)
MCH: 33.8 pg (ref 26.0–34.0)
MCHC: 33.8 g/dL (ref 30.0–36.0)
MCV: 100 fL (ref 80.0–100.0)
Platelets: 210 10*3/uL (ref 150–400)
RBC: 4.56 MIL/uL (ref 3.87–5.11)
RDW: 14.3 % (ref 11.5–15.5)
WBC: 11.7 10*3/uL — ABNORMAL HIGH (ref 4.0–10.5)
nRBC: 0 % (ref 0.0–0.2)

## 2020-01-08 LAB — BASIC METABOLIC PANEL
Anion gap: 16 — ABNORMAL HIGH (ref 5–15)
BUN: 15 mg/dL (ref 6–20)
CO2: 18 mmol/L — ABNORMAL LOW (ref 22–32)
Calcium: 9.3 mg/dL (ref 8.9–10.3)
Chloride: 109 mmol/L (ref 98–111)
Creatinine, Ser: 0.83 mg/dL (ref 0.44–1.00)
GFR calc Af Amer: >60 mL/min (ref >60)
GFR calc non Af Amer: >60 mL/min (ref >60)
Glucose, Bld: 131 mg/dL — ABNORMAL HIGH (ref 70–99)
Potassium: 3.3 mmol/L — ABNORMAL LOW (ref 3.5–5.1)
Sodium: 143 mmol/L (ref 135–145)

## 2020-01-08 LAB — I-STAT BETA HCG BLOOD, ED (MC, WL, AP ONLY): I-stat hCG, quantitative: <5 m[IU]/mL (ref <5)

## 2020-01-08 LAB — TROPONIN I (HIGH SENSITIVITY): Troponin I (High Sensitivity): 2 ng/L (ref <18)

## 2020-01-08 LAB — D-DIMER, QUANTITATIVE: D-Dimer, Quant: 0.47 ug/mL-FEU (ref 0.00–0.50)

## 2020-01-08 MED ORDER — SODIUM CHLORIDE 0.9 % IV BOLUS
1000.0000 mL | Freq: Once | INTRAVENOUS | Status: AC
  Administered 2020-01-08: 1000 mL via INTRAVENOUS

## 2020-01-08 MED ORDER — DIPHENHYDRAMINE HCL 50 MG/ML IJ SOLN
25.0000 mg | Freq: Once | INTRAMUSCULAR | Status: AC
  Administered 2020-01-08: 25 mg via INTRAVENOUS
  Filled 2020-01-08: qty 1

## 2020-01-08 MED ORDER — SODIUM CHLORIDE 0.9% FLUSH: 3.0000 mL | Freq: Once | INTRAVENOUS | Status: DC

## 2020-01-08 MED ORDER — PROCHLORPERAZINE EDISYLATE 10 MG/2ML IJ SOLN
10.0000 mg | Freq: Once | INTRAMUSCULAR | Status: AC
  Administered 2020-01-08: 10 mg via INTRAVENOUS
  Filled 2020-01-08: qty 2

## 2020-01-08 NOTE — ED Triage Notes (Signed)
Patient here form home reports chest pain that started today, pressure with tightness. States "I just dont feel right". Nausea, no vomiting.

## 2020-01-08 NOTE — ED Notes (Signed)
Pt verbalized d/c instructions and follow up care. Alert and ambulatory. No IV. Has a ride home

## 2020-01-08 NOTE — ED Provider Notes (Signed)
[facility]-EMERGENCY DEPT [facility] Emergency Department Provider Note MRN:  [id number]  Arrival date & time: 01/08/20     Chief Complaint   Chest Pain   History of Present Illness   [name] is a 34 y.o. year-old female with a history of bipolar disorder presenting to the ED with chief complaint of chest pain.  Patient explains that she went to bed at 3 AM, woke up at 5:30 AM and noticed that the left side of her body felt numb or strange.  She also experienced a headache at this time.  The numbness sensation went away after a couple hours.  The remainder of the day, she began experiencing sharp chest pain, constant, worse with deep breathing.  Continued headache.  No neck pain or stiffness, no fever, no IV drug use, no bowel or bladder dysfunction, no abdominal pain.  Pain is moderate to severe, constant.  Review of Systems  A complete 10 system review of systems was obtained and all systems are negative except as noted in the HPI and PMH.   Patient's Health History    Past Medical History:  Diagnosis Date  . Anxiety   . Asthma   . Bipolar 1 disorder (HCC)   . Cataract    left eye  . Depression   . Endometriosis   . GERD (gastroesophageal reflux disease)   . Mental disorder     Past Surgical History:  Procedure Laterality Date  . ABDOMINAL SURGERY     "For endometriosis"  . CHOLECYSTECTOMY    . OOPHORECTOMY Right 2012  . TUBAL LIGATION      Family History  Problem Relation Age of Onset  . Stroke Mother   . Hyperlipidemia Other   . Colon cancer Neg Hx   . Stomach cancer Neg Hx   . Pancreatic cancer Neg Hx   . Esophageal cancer Neg Hx     Social History   Socioeconomic History  . Marital status: Legally Separated    Spouse name: Not on file  . Number of children: Not on file  . Years of education: Not on file  . Highest education level: Not on file  Occupational History  . Not on file  Tobacco Use  . Smoking status: Current Every Day Smoker   Packs/day: 0.25    Years: 5.00    Pack years: 1.25    Types: Cigarettes  . Smokeless tobacco: Never Used  Substance and Sexual Activity  . Alcohol use: Never  . Drug use: Yes    Types: Marijuana, Barbituates    Comment: used weed 12 Jul 2019  . Sexual activity: Yes    Partners: Male    Birth control/protection: Surgical, Pill  Other Topics Concern  . Not on file  Social History Narrative  . Not on file   Social Determinants of Health   Financial Resource Strain:   . Difficulty of Paying Living Expenses:   Food Insecurity:   . Worried About Programme researcher, broadcasting/film/video in the Last Year:   . Barista in the Last Year:   Transportation Needs:   . Freight forwarder (Medical):   Marland Kitchen Lack of Transportation (Non-Medical):   Physical Activity:   . Days of Exercise per Week:   . Minutes of Exercise per Session:   Stress:   . Feeling of Stress :   Social Connections:   . Frequency of Communication with Friends and Family:   . Frequency of Social Gatherings with Friends and  Family:   . Attends Religious Services:   . Active Member of Clubs or Organizations:   . Attends Archivist Meetings:   Marland Kitchen Marital Status:   Intimate Partner Violence:   . Fear of Current or Ex-Partner:   . Emotionally Abused:   Marland Kitchen Physically Abused:   . Sexually Abused:      Physical Exam   Vitals:   01/08/20 2031 01/08/20 2100  BP: 114/80 124/83  Pulse: 78 73  Resp: 19 20  Temp:    SpO2: 96% 95%    CONSTITUTIONAL: Well-appearing, NAD NEURO:  Alert and oriented x 3, no focal deficits EYES:  eyes equal and reactive ENT/NECK:  no LAD, no JVD CARDIO: Regular rate, well-perfused, normal S1 and S2 PULM:  CTAB no wheezing or rhonchi GI/GU:  normal bowel sounds, non-distended, non-tender MSK/SPINE:  No gross deformities, no edema SKIN:  no rash, atraumatic PSYCH:  Appropriate speech and behavior  *Additional and/or pertinent findings included in MDM below  Diagnostic and Interventional  Summary    EKG Interpretation  Date/Time:  Tuesday Jan 08 2020 18:03:49 EDT Ventricular Rate:  87 PR Interval:  114 QRS Duration: 78 QT Interval:  368 QTC Calculation: 442 R Axis:   -164 Text Interpretation: Normal sinus rhythm Right superior axis deviation Cannot rule out Anterior infarct , age undetermined ST & T wave abnormality, consider inferior ischemia Abnormal ECG Confirmed by Gerlene Fee [phone]) on 01/08/2020 8:25:24 PM      Labs Reviewed  BASIC METABOLIC PANEL - Abnormal; Notable for the following components:      Result Value   Potassium 3.3 (*)    CO2 18 (*)    Glucose, Bld 131 (*)    Anion gap 16 (*)    All other components within normal limits  CBC - Abnormal; Notable for the following components:   WBC 11.7 (*)    Hemoglobin 15.4 (*)    All other components within normal limits  D-DIMER, QUANTITATIVE (NOT AT [facility])  I-STAT BETA HCG BLOOD, ED ([facility], [facility], [facility] ONLY)  TROPONIN I (HIGH SENSITIVITY)  TROPONIN I (HIGH SENSITIVITY)    CT Head Wo Contrast  Final Result    DG Chest 2 View  Final Result      Medications  sodium chloride flush (NS) 0.9 % injection 3 mL (0 mLs Intravenous Hold 01/08/20 1942)  diphenhydrAMINE (BENADRYL) injection 25 mg (25 mg Intravenous Given 01/08/20 2056)  prochlorperazine (COMPAZINE) injection 10 mg (10 mg Intravenous Given 01/08/20 2056)  sodium chloride 0.9 % bolus 1,000 mL (1,000 mLs Intravenous New Bag/Given 01/08/20 2058)     Procedures  /  Critical Care Procedures  ED Course and Medical Decision Making  I have reviewed the triage vital signs, the nursing notes, and pertinent available records from the EMR.  Listed above are laboratory and imaging tests that I personally ordered, reviewed, and interpreted and then considered in my medical decision making (see below for details).      Patient is with a completely normal neurological exam at this time.  This question of left sided numbness or strain sensation would possibly  raise concern for vascular phenomenon or MS, however in the setting of headache with this otherwise fairly healthy and young female I am favoring complex migraine.  The chest pain is sharp and worse with deep breaths, will screen with D-dimer.  Upon reassessment patient is sleeping peacefully.  D-dimer is negative, largely excluding PE.  Chest x-ray is with no evidence of enlarged  mediastinum, doubt dissection.  Patient continues to have a completely normal neurological exam, she feels much better after migraine cocktail.  She is 34 years old without any cardiovascular risk factors, stroke or other significant pathology thought to be extremely unlikely.  CT head is normal.  Patient seems appropriate for neurology follow-up for evaluation of her long history of headaches, as today's presentation could be a complex migraine.  Strict return precautions.  Elmer Sow. Pilar Plate, MD [facility] Emergency Medicine [facility] [email or web address]  Final Clinical Impressions(s) / ED Diagnoses     ICD-10-CM   1. Chest pain, unspecified type  R07.9   2. Other migraine without status migrainosus, not intractable  G43.809     ED Discharge Orders         Ordered    Ambulatory referral to Neurology    Comments: An appointment is requested in approximately: 2 weeks   01/08/20 2250           Discharge Instructions Discussed with and Provided to Patient:     Discharge Instructions     You were evaluated in the Emergency Department and after careful evaluation, we did not find any emergent condition requiring admission or further testing in the hospital.  Your exam/testing today is overall reassuring.  As discussed, please follow-up with the neurologists to discuss your headaches and more recent symptoms.  Please return to the Emergency Department if you experience any worsening of your condition.  We encourage you to follow up with a primary care provider.  Thank you for allowing [place] to be  a part of your care.       Sabas Sous, MD 01/08/20 2251

## 2020-01-08 NOTE — Discharge Instructions (Signed)
You were evaluated in the Emergency Department and after careful evaluation, we did not find any emergent condition requiring admission or further testing in the hospital.  Your exam/testing today is overall reassuring.  As discussed, please follow-up with the neurologists to discuss your headaches and more recent symptoms.  Please return to the Emergency Department if you experience any worsening of your condition.  We encourage you to follow up with a primary care provider.  Thank you for allowing [place] to be a part of your care.

## 2020-01-10 NOTE — Progress Notes (Signed)
[facility]    Provider:  Dr Lucia Gaskins Requesting Provider: Kennis Carina MD (emergency room) Primary Care Provider:  [facility]  CC:  Left sided numbness in the setting of headache  HPI:  [name] is a 34 y.o. female here as requested by [facility] for "complicated migraine".  Past medical history anxiety, asthma, bipolar 1 disorder, cataract left eye, depression, GERD.  I reviewed Kennis Carina MD's notes: Patient presented to the emergency room on Jan 08, 2020,  she presented with chest pain and explained that she went to bed at 3 AM and woke up at 5:30 AM and noted the left side of her body felt numb or strange, she also experienced a headache, the numbness resolved after few hours but the remainder of the day she began experiencing sharp chest pain, constant, worse with breathing and continued headache, no neck pain or stiffness, no fever no IV drug use no bladder dysfunction, no abdominal pain, pain moderate to severe and constant.  She is a current every day smoker 1.25 pack years, she denied alcohol use but endorsed drug use including marijuana and barbiturates.  Her laboratory values showed her potassium was slightly decreased at 3.3, her white blood cells were elevated at 11.7 with slightly increased hemoglobin as well 15.4, anion gap 16, glucose slightly elevated at 131, all else unremarkable.  She was given fluids, Benadryl injection, Compazine injection.  The physical and neurological exam were completely normal favoring complex migraine, D-dimer was negative, chest x-ray with no evidence of acute etiology and she felt better after the migraine cocktail.  CT of the head was normal.  She does have a long history of headaches and she was referred to neurology.  She is here alone. She has been having "real bad headaches for months". They have been getting worse. She woke up the other night about 530 and she was numb on one  side the face, the left arm and leg, her grandmother noticed, she felt weird, her face is drooping, drooping lasted 4 hours, the numbness lasted an hour, she had a headache at the time, not a migraine but a "weird headache", this was not a common experience. She has severe migraines, photophobia/phonophobia, unilateral and spread bilaterally behind the eyes and then to the back of her head down through her neck. Pulsating/pounding/throbing. Daily headaches, 15 migraine days a month, they can last 12-24 hours.   Reviewed notes, labs and imaging from outside physicians, which showed:  Meds tried that can be used in migraine prevention/treatment: Propranolol, baclofen, zofran, topiramate, trazodone, maxalt, zofran, gabapentin, tylenol, can't take ibuprofen, tegretol, celexa, lamictal, robaxin, reglan, metoprolol, prednisone, sertraline, tramadol, effexor, imitrex,   01/08/2020:   Ct showed Normal CT head; No acute intracranial abnormalities including mass lesion or mass effect, hydrocephalus, extra-axial fluid collection, midline shift, hemorrhage, or acute infarction, large ischemic events (personally reviewed images)   Review of Systems: Patient complains of symptoms per HPI as well as the following symptoms: intractable headache,fatigue Pertinent negatives and positives per HPI. All others negative.   Social History   Socioeconomic History  . Marital status: Legally Separated    Spouse name: Not on file  . Number of children: Not on file  . Years of education: Not on file  . Highest education level: Not on file  Occupational History  . Not on file  Tobacco Use  . Smoking status: Current Every Day Smoker    Packs/day: 0.25  Years: 5.00    Pack years: 1.25    Types: Cigarettes  . Smokeless tobacco: Never Used  Substance and Sexual Activity  . Alcohol use: Never  . Drug use: Yes    Types: Marijuana, Barbituates    Comment: used weed 12 Jul 2019  . Sexual activity: Yes    Partners:  Male    Birth control/protection: Surgical, Pill  Other Topics Concern  . Not on file  Social History Narrative  . Not on file   Social Determinants of Health   Financial Resource Strain:   . Difficulty of Paying Living Expenses:   Food Insecurity:   . Worried About Programme researcher, broadcasting/film/video in the Last Year:   . Barista in the Last Year:   Transportation Needs:   . Freight forwarder (Medical):   Marland Kitchen Lack of Transportation (Non-Medical):   Physical Activity:   . Days of Exercise per Week:   . Minutes of Exercise per Session:   Stress:   . Feeling of Stress :   Social Connections:   . Frequency of Communication with Friends and Family:   . Frequency of Social Gatherings with Friends and Family:   . Attends Religious Services:   . Active Member of Clubs or Organizations:   . Attends Banker Meetings:   Marland Kitchen Marital Status:   Intimate Partner Violence:   . Fear of Current or Ex-Partner:   . Emotionally Abused:   Marland Kitchen Physically Abused:   . Sexually Abused:     Family History  Problem Relation Age of Onset  . Stroke Mother   . Hyperlipidemia Other   . Colon cancer Neg Hx   . Stomach cancer Neg Hx   . Pancreatic cancer Neg Hx   . Esophageal cancer Neg Hx     Past Medical History:  Diagnosis Date  . Anxiety   . Asthma   . Bipolar 1 disorder (HCC)   . Cataract    left eye  . Depression   . Endometriosis   . GERD (gastroesophageal reflux disease)   . Mental disorder   . Migraine     Patient Active Problem List   Diagnosis Date Noted  . Hiatal hernia 10/10/2019  . Bipolar 1 disorder, depressed, severe (HCC) 02/22/2019  . Severe recurrent major depression with psychotic features (HCC) 07/23/2017  . Substance use disorder 07/23/2017  . Pelvic pain in female 01/15/2015  . MDD (major depressive disorder), recurrent episode, severe (HCC) 03/11/2014  . MDD (major depressive disorder) 03/11/2014  . Bipolar disorder with depression (HCC) 09/14/2013  .  Major depressive disorder, recurrent episode, severe (HCC) 12/30/2011  . Bipolar affective disorder, depressed, severe (HCC) 12/30/2011    Past Surgical History:  Procedure Laterality Date  . ABDOMINAL SURGERY     "For endometriosis"  . CHOLECYSTECTOMY    . OOPHORECTOMY Right 2012  . TUBAL LIGATION      Current Outpatient Medications  Medication Sig Dispense Refill  . baclofen (LIORESAL) 10 MG tablet Take 10 mg by mouth 3 (three) times daily.    . clonazePAM (KLONOPIN) 1 MG tablet Take 1 mg by mouth 3 (three) times daily as needed for anxiety.     Marland Kitchen FLUoxetine (PROZAC) 20 MG capsule Take 20 mg by mouth daily.    . ondansetron (ZOFRAN-ODT) 8 MG disintegrating tablet Take 8 mg by mouth every 8 (eight) hours as needed for nausea or vomiting.    . pantoprazole (PROTONIX) 40 MG tablet TAKE 1  TABLET BY MOUTH EVERY DAY (Patient taking differently: Take 40 mg by mouth daily. ) 30 tablet 3  . propranolol (INDERAL) 20 MG tablet Take 20 mg by mouth 3 (three) times daily.    . QUEtiapine (SEROQUEL) 300 MG tablet Take 300 mg by mouth at bedtime.     . rizatriptan (MAXALT) 5 MG tablet Take 5 mg by mouth as needed for migraine. May repeat in 2 hours if needed    . rOPINIRole (REQUIP) 3 MG tablet Take 3 mg by mouth every evening.     . SPRINTEC 28 0.25-35 MG-MCG tablet Take 1 tablet by mouth daily.    Marland Kitchen topiramate (TOPAMAX) 100 MG tablet Take 100 mg by mouth 2 (two) times daily.    . valACYclovir (VALTREX) 500 MG tablet Take 500 mg by mouth daily.     . Vitamin D, Ergocalciferol, (DRISDOL) 1.25 MG (50000 UNIT) CAPS capsule Take 50,000 Units by mouth every 7 (seven) days.    Dorise Hiss (AIMOVIG) 140 MG/ML SOAJ Inject 140 mg into the skin every 30 (thirty) days. 1 pen 11  . Rimegepant Sulfate (NURTEC) 75 MG TBDP Take 75 mg by mouth daily. For migraines. Take as close to onset of migraine as possible. One daily maximum. 10 tablet 0   No current facility-administered medications for this visit.     Allergies as of 01/11/2020 - Review Complete 01/11/2020  Allergen Reaction Noted  . Bee venom Anaphylaxis and Swelling 01/15/2015  . Coconut flavor Anaphylaxis 09/23/2014  . Ibuprofen Hives 07/22/2017  . Naproxen Nausea And Vomiting 05/03/2017  . Tramadol Other (See Comments) 11/20/2019    Vitals: BP 116/81   Pulse 95   Temp (!) 97.3 F (36.3 C) (Temporal)   Ht 5\' 2"  (1.575 m)   Wt 190 lb (86.2 kg)   BMI 34.75 kg/m  Last Weight:  Wt Readings from Last 1 Encounters:  01/11/20 190 lb (86.2 kg)   Last Height:   Ht Readings from Last 1 Encounters:  01/11/20 5\' 2"  (1.575 m)     Physical exam: Exam: Gen: NAD, conversant, well nourised, obese, well groomed                     CV: RRR, no MRG. No Carotid Bruits. No peripheral edema, warm, nontender Eyes: Conjunctivae clear without exudates or hemorrhage  Neuro: Detailed Neurologic Exam  Speech:    Speech is normal; fluent and spontaneous with normal comprehension.  Cognition:    The patient is oriented to person, place, and time;     recent and remote memory intact;     language fluent;     normal attention, concentration,     fund of knowledge Cranial Nerves:    The pupils are equal, round, and reactive to light. Attempted fundoscopy could not visualize Visual fields are full to finger confrontation. Extraocular movements are intact. Trigeminal sensation is intact and the muscles of mastication are normal. Slightly lower right lid otherwise face is symmetric. The palate elevates in the midline. Hearing intact. Voice is normal. Shoulder shrug is normal. The tongue has normal motion without fasciculations.   Coordination:    Normal finger to nose and heel to shin.   Gait:    Normal native gait  Motor Observation:    No asymmetry, no atrophy, and no involuntary movements noted. Tone:    Normal muscle tone.    Posture:    Posture is normal. normal erect    Strength:    Strength  is V/V in the upper and lower  limbs.      Sensation: intact to LT     Reflex Exam:  DTR's:    Deep tendon reflexes in the upper and lower extremities are symmetrical bilaterally.   Toes:    The toes are downgoing bilaterally.   Clonus:    Clonus is absent.    Assessment/Plan:  34 year old with episode of left-sided numbness and facial droop in the setting of a headache. This may have been migraine with aura ("complicated migraine") however need to rule out strokes and causes for TIA. There is increased risk in women with migraine aura for stroke, she has a family history of stroke as well in her mother.   - if workup neg and this is migraine with aura will need to discuss stroke risks in women with migraine with aura - Consider sleep evaluation, discuss at next appointment - Start Aimovig once monthly for migraine prevention - Nurtec to take daily for a week - MRI of the brain, MRA of the head - [place] imaging - Echocardiogram of the heart - Cardiology - Transcranial doppler - hospital - Bloodwork - carotid dopplers - she cannot take aspirin, she declines starting plavix at this time until workup complete  Orders Placed This Encounter  Procedures  . MR BRAIN W WO CONTRAST  . MR ANGIO HEAD WO CONTRAST  . Pregnancy, urine  . Hemoglobin A1c  . Lipid panel  . ECHOCARDIOGRAM COMPLETE BUBBLE STUDY  . VAS US CAROTID  . VAS [place] TRANSCRANIAL DOPPLER W BUBBLES   Meds ordered this encounter  Medications  . Erenumab-aooe (AIMOVIG) 140 MG/ML SOAJ    Sig: Inject 140 mg into the skin every 30 (thirty) days.    Dispense:  1 pen    Refill:  11  . Rimegepant Sulfate (NURTEC) 75 MG TBDP    Sig: Take 75 mg by mouth daily. For migraines. Take as close to onset of migraine as possible. One daily maximum.    Dispense:  10 tablet    Refill:  0    Cc: [facility]  Gerlene Fee MD  Sarina Ill, MD  [facility] [address] [place], [place] [address]  Phone  [phone] Fax [phone]

## 2020-01-11 ENCOUNTER — Ambulatory Visit: Payer: Medicaid Other | Admitting: Neurology

## 2020-01-11 ENCOUNTER — Telehealth: Payer: Self-pay | Admitting: Neurology

## 2020-01-11 ENCOUNTER — Other Ambulatory Visit: Payer: Self-pay

## 2020-01-11 ENCOUNTER — Encounter: Payer: Self-pay | Admitting: Neurology

## 2020-01-11 VITALS — BP 116/81 | HR 95 | Temp 97.3°F | Ht 62.0 in | Wt 190.0 lb

## 2020-01-11 DIAGNOSIS — G459 Transient cerebral ischemic attack, unspecified: Secondary | ICD-10-CM | POA: Diagnosis not present

## 2020-01-11 DIAGNOSIS — G43709 Chronic migraine without aura, not intractable, without status migrainosus: Secondary | ICD-10-CM

## 2020-01-11 DIAGNOSIS — G43109 Migraine with aura, not intractable, without status migrainosus: Secondary | ICD-10-CM | POA: Diagnosis not present

## 2020-01-11 DIAGNOSIS — R51 Headache with orthostatic component, not elsewhere classified: Secondary | ICD-10-CM

## 2020-01-11 DIAGNOSIS — R519 Headache, unspecified: Secondary | ICD-10-CM

## 2020-01-11 DIAGNOSIS — R200 Anesthesia of skin: Secondary | ICD-10-CM

## 2020-01-11 DIAGNOSIS — G8929 Other chronic pain: Secondary | ICD-10-CM

## 2020-01-11 DIAGNOSIS — H539 Unspecified visual disturbance: Secondary | ICD-10-CM

## 2020-01-11 MED ORDER — NURTEC 75 MG PO TBDP: 75.0000 mg | ORAL_TABLET | Freq: Every day | ORAL | 0 refills | Status: DC

## 2020-01-11 MED ORDER — AIMOVIG 140 MG/ML ~~LOC~~ SOAJ: 140.0000 mg | SUBCUTANEOUS | 11 refills | Status: DC

## 2020-01-11 NOTE — Telephone Encounter (Signed)
medicaid order sent to GI. They will obtain the auth and reach out to the patient to schedule.  °

## 2020-01-11 NOTE — Patient Instructions (Addendum)
Start Aimovig once monthly Nurtec to take daily for a week MRI of the brain, MRA of the head - [place] imaging Echocardiogram of the heart - Cardiology Transcranial doppler - hospital Bloodwork Carotid dopplers    Transient Ischemic Attack  A transient ischemic attack (TIA) is a "warning stroke" that causes stroke-like symptoms that go away quickly. A TIA does not cause lasting damage to the brain. But having a TIA is a sign that you may be at risk for a stroke. Lifestyle changes and medical treatments can help prevent a stroke. It is important to know the symptoms of a TIA and what to do. Get help right away, even if your symptoms go away. The symptoms of a TIA are the same as those of a stroke. They can happen fast, and they usually go away within minutes or hours. They can include:  Weakness or loss of feeling in your face, arm, or leg. This often happens on one side of your body.  Trouble walking.  Trouble moving your arms or legs.  Trouble talking or understanding what people are saying.  Trouble seeing.  Seeing two of one object (double vision).  Feeling dizzy.  Feeling confused.  Loss of balance or coordination.  Feeling sick to your stomach (nauseous) and throwing up (vomiting).  A very bad headache for no reason. What increases the risk? Certain things may make you more likely to have a TIA. Some of these are things that you can change, such as:  Being very overweight (obese).  Using products that contain nicotine or tobacco, such as cigarettes and e-cigarettes.  Taking birth control pills.  Not being active.  Drinking too much alcohol.  Using drugs. Other risk factors include:  Having an irregular heartbeat (atrial fibrillation).  Being African American or Hispanic.  Having had blood clots, stroke, TIA, or heart attack in the past.  Being a woman with a history of high blood pressure in pregnancy (preeclampsia).  Being over the age of  73.  Being female.  Having family history of stroke.  Having the following diseases or conditions: ? High blood pressure. ? High cholesterol. ? Diabetes. ? Heart disease. ? Sickle cell disease. ? Sleep apnea. ? Migraine headache. ? Long-term (chronic) diseases that cause soreness and swelling (inflammation). ? Disorders that affect how your blood clots. Follow these instructions at home: Medicines   Take over-the-counter and prescription medicines only as told by your doctor.  If you were told to take aspirin or another medicine to thin your blood, take it exactly as told by your doctor. ? Taking too much of the medicine can cause bleeding. ? Taking too little of the medicine may not work to treat the problem. Eating and drinking   Eat 5 or more servings of fruits and vegetables each day.  Follow instructions from your doctor about your diet. You may need to follow a certain diet to help lower your risk of having a stroke. You may need to: ? Eat a diet that is low in fat and salt. ? Eat foods that contain a lot of fiber. ? Limit the amount of carbohydrates and sugar in your diet.  Limit alcohol intake to 1 drink a day for nonpregnant women and 2 drinks a day for men. One drink equals 12 oz of beer, 5 oz of wine, or 1 oz of hard liquor. General instructions  Keep a healthy weight.  Stay active. Try to get at least 30 minutes of activity on all or  most days.  Find out if you have a condition called sleep apnea. Get treatment if needed.  Do not use any products that contain nicotine or tobacco, such as cigarettes and e-cigarettes. If you need help quitting, ask your doctor.  Do not abuse drugs.  Keep all follow-up visits as told by your doctor. This is important. Get help right away if:  You have any signs of stroke. "BE FAST" is an easy way to remember the main warning signs: ? B - Balance. Signs are dizziness, sudden trouble walking, or loss of balance. ? E - Eyes.  Signs are trouble seeing or a sudden change in how you see. ? F - Face. Signs are sudden weakness or loss of feeling of the face, or the face or eyelid drooping on one side. ? A - Arms. Signs are weakness or loss of feeling in an arm. This happens suddenly and usually on one side of the body. ? S - Speech. Signs are sudden trouble speaking, slurred speech, or trouble understanding what people say. ? T - Time. Time to call emergency services. Write down what time symptoms started.  You have other signs of stroke, such as: ? A sudden, very bad headache with no known cause. ? Feeling sick to your stomach (nausea). ? Throwing up (vomiting). ? Jerky movements that you cannot control (seizure). These symptoms may be an emergency. Do not wait to see if the symptoms will go away. Get medical help right away. Call your local emergency services (911 in the [place] Do not drive yourself to the hospital. Summary  A transient ischemic attack (TIA) is a "warning stroke" that causes stroke-like symptoms that go away quickly.  A TIA is a medical emergency. Get help right away, even if your symptoms go away.  A TIA does not cause lasting damage to the brain.  Having a TIA is a sign that you may be at risk for a stroke. Lifestyle changes and medical treatments can help prevent a stroke. This information is not intended to replace advice given to you by your health care provider. Make sure you discuss any questions you have with your health care provider. Document Revised: 05/12/2018 Document Reviewed: 11/17/2016 Elsevier Patient Education  2020 [employer].  Tiburones injection What is this medicine? ERENUMAB (e REN ue mab) is used to prevent migraine headaches. This medicine may be used for other purposes; ask your health care provider or pharmacist if you have questions. COMMON BRAND NAME(S): Aimovig What should I tell my health care provider before I take this medicine? They need to know if you have  any of these conditions:  an unusual or allergic reaction to erenumab, latex, other medicines, foods, dyes, or preservatives  high blood pressure  pregnant or trying to get pregnant  breast-feeding How should I use this medicine? This medicine is for injection under the skin. You will be taught how to prepare and give this medicine. Use exactly as directed. Take your medicine at regular intervals. Do not take your medicine more often than directed. It is important that you put your used needles and syringes in a special sharps container. Do not put them in a trash can. If you do not have a sharps container, call your pharmacist or healthcare provider to get one. Talk to your pediatrician regarding the use of this medicine in children. Special care may be needed. Overdosage: If you think you have taken too much of this medicine contact a poison control center or  emergency room at once. NOTE: This medicine is only for you. Do not share this medicine with others. What if I miss a dose? If you miss a dose, take it as soon as you can. If it is almost time for your next dose, take only that dose. Do not take double or extra doses. What may interact with this medicine? Interactions are not expected. This list may not describe all possible interactions. Give your health care provider a list of all the medicines, herbs, non-prescription drugs, or dietary supplements you use. Also tell them if you smoke, drink alcohol, or use illegal drugs. Some items may interact with your medicine. What should I watch for while using this medicine? Tell your doctor or healthcare professional if your symptoms do not start to get better or if they get worse. What side effects may I notice from receiving this medicine? Side effects that you should report to your doctor or health care professional as soon as possible:  allergic reactions like skin rash, itching or hives, swelling of the face, lips, or tongue  chest  pain  fast, irregular heartbeat  feeling faint or lightheaded  palpitations Side effects that usually do not require medical attention (report these to your doctor or health care professional if they continue or are bothersome):  constipation  muscle cramps  pain, redness, or irritation at site where injected This list may not describe all possible side effects. Call your doctor for medical advice about side effects. You may report side effects to FDA at 1-800-FDA-1088. Where should I keep my medicine? Keep out of the reach of children. You will be instructed on how to store this medicine. Throw away any unused medicine after the expiration date on the label. NOTE: This sheet is a summary. It may not cover all possible information. If you have questions about this medicine, talk to your doctor, pharmacist, or health care provider.  2020 Elsevier/Gold Standard (2019-01-01 15:43:58) Rimegepant oral dissolving tablet What is this medicine? RIMEGEPANT (ri ME je pant) is used to treat migraine headaches with or without aura. An aura is a strange feeling or visual disturbance that warns you of an attack. It is not used to prevent migraines. This medicine may be used for other purposes; ask your health care provider or pharmacist if you have questions. COMMON BRAND NAME(S): NURTEC ODT What should I tell my health care provider before I take this medicine? They need to know if you have any of these conditions:  kidney disease  liver disease  an unusual or allergic reaction to rimegepant, other medicines, foods, dyes, or preservatives  pregnant or trying to get pregnant  breast-feeding How should I use this medicine? Take the medicine by mouth. Follow the directions on the prescription label. Leave the tablet in the sealed blister pack until you are ready to take it. With dry hands, open the blister and gently remove the tablet. If the tablet breaks or crumbles, throw it away and take a  new tablet out of the blister pack. Place the tablet in the mouth and allow it to dissolve, and then swallow. Do not cut, crush, or chew this medicine. You do not need water to take this medicine. Talk to your pediatrician about the use of this medicine in children. Special care may be needed. Overdosage: If you think you have taken too much of this medicine contact a poison control center or emergency room at once. NOTE: This medicine is only for you. Do not share  this medicine with others. What if I miss a dose? This does not apply. This medicine is not for regular use. What may interact with this medicine? This medicine may interact with the following medications:  certain medicines for fungal infections like fluconazole, itraconazole  rifampin This list may not describe all possible interactions. Give your health care provider a list of all the medicines, herbs, non-prescription drugs, or dietary supplements you use. Also tell them if you smoke, drink alcohol, or use illegal drugs. Some items may interact with your medicine. What should I watch for while using this medicine? Visit your health care professional for regular checks on your progress. Tell your health care professional if your symptoms do not start to get better or if they get worse. What side effects may I notice from receiving this medicine? Side effects that you should report to your doctor or health care professional as soon as possible:  allergic reactions like skin rash, itching or hives; swelling of the face, lips, or tongue Side effects that usually do not require medical attention (report these to your doctor or health care professional if they continue or are bothersome):  nausea This list may not describe all possible side effects. Call your doctor for medical advice about side effects. You may report side effects to FDA at 1-800-FDA-1088. Where should I keep my medicine? Keep out of the reach of children. Store at  room temperature between 15 and 30 degrees C (59 and 86 degrees F). Throw away any unused medicine after the expiration date. NOTE: This sheet is a summary. It may not cover all possible information. If you have questions about this medicine, talk to your doctor, pharmacist, or health care provider.  2020 Elsevier/Gold Standard (2018-10-30 00:21:31)

## 2020-01-12 LAB — LIPID PANEL
Chol/HDL Ratio: 2.9 ratio (ref 0.0–4.4)
Cholesterol, Total: 191 mg/dL (ref 100–199)
HDL: 67 mg/dL (ref >39)
LDL Chol Calc (NIH): 93 mg/dL (ref 0–99)
Triglycerides: 184 mg/dL — ABNORMAL HIGH (ref 0–149)
VLDL Cholesterol Cal: 31 mg/dL (ref 5–40)

## 2020-01-12 LAB — HEMOGLOBIN A1C
Est. average glucose Bld gHb Est-mCnc: 105 mg/dL
Hgb A1c MFr Bld: 5.3 % (ref 4.8–5.6)

## 2020-01-12 LAB — PREGNANCY, URINE: Preg Test, Ur: NEGATIVE

## 2020-01-17 ENCOUNTER — Ambulatory Visit (HOSPITAL_COMMUNITY)
Admission: RE | Admit: 2020-01-17 | Discharge: 2020-01-17 | Disposition: A | Discharge status: Home / Self Care | Payer: Medicaid Other | Source: Ambulatory Visit | Attending: Neurology | Admitting: Neurology

## 2020-01-17 ENCOUNTER — Other Ambulatory Visit: Payer: Self-pay | Admitting: Neurology

## 2020-01-17 ENCOUNTER — Telehealth: Payer: Self-pay | Admitting: Neurology

## 2020-01-17 ENCOUNTER — Other Ambulatory Visit: Payer: Self-pay

## 2020-01-17 DIAGNOSIS — G459 Transient cerebral ischemic attack, unspecified: Secondary | ICD-10-CM | POA: Diagnosis present

## 2020-01-17 MED ORDER — NURTEC 75 MG PO TBDP: 75.0000 mg | ORAL_TABLET | Freq: Every day | ORAL | 6 refills | Status: DC | PRN

## 2020-01-17 NOTE — Telephone Encounter (Signed)
Pt has called to report that she used the Nurtec samples and would like a prescription called into [facility] [phone]

## 2020-01-18 ENCOUNTER — Telehealth: Payer: Self-pay | Admitting: Gastroenterology

## 2020-01-18 NOTE — Telephone Encounter (Signed)
Pt states that she has been experiencing blood in her in her stool for the past two months but it has gotten worse lately, yesterday she noticed substantial amount of blood in the toilet. she states that she has been having diarrhea for the past four months proximately. She would like some advise.

## 2020-01-18 NOTE — Telephone Encounter (Signed)
Discussed with pt that she needs an office visit. Pt knows if bleeding gets heavier she needs to see PCP or go to ER. Pt scheduled to see Mike Gip PA 02/05/20@2pm . Pt aware of appt.

## 2020-01-24 ENCOUNTER — Ambulatory Visit (HOSPITAL_COMMUNITY): Admission: RE | Admit: 2020-01-24 | Payer: Medicaid Other | Source: Ambulatory Visit

## 2020-01-24 ENCOUNTER — Telehealth: Payer: Self-pay | Admitting: *Deleted

## 2020-01-24 NOTE — Telephone Encounter (Signed)
 Tracks PA form completed for [employer]	[place], signed, then faxed to [employer] along with copy of insurance card and office note. Received a receipt of confirmation.

## 2020-01-31 ENCOUNTER — Other Ambulatory Visit: Payer: Self-pay

## 2020-01-31 ENCOUNTER — Ambulatory Visit (HOSPITAL_COMMUNITY)
Admission: RE | Admit: 2020-01-31 | Discharge: 2020-01-31 | Disposition: A | Discharge status: Home / Self Care | Payer: Medicaid Other | Source: Ambulatory Visit | Attending: Neurology | Admitting: Neurology

## 2020-01-31 DIAGNOSIS — G459 Transient cerebral ischemic attack, unspecified: Secondary | ICD-10-CM | POA: Diagnosis not present

## 2020-02-05 ENCOUNTER — Telehealth: Payer: Self-pay | Admitting: Gastroenterology

## 2020-02-05 ENCOUNTER — Ambulatory Visit: Payer: Medicaid Other | Admitting: Physician Assistant

## 2020-02-06 MED ORDER — PANTOPRAZOLE SODIUM 40 MG PO TBEC: 40.0000 mg | DELAYED_RELEASE_TABLET | Freq: Every day | ORAL | 3 refills | Status: DC

## 2020-02-06 NOTE — Telephone Encounter (Signed)
Protonix refilled

## 2020-02-07 NOTE — Telephone Encounter (Signed)
I call [place] tracks about the PA status of pts nurtec. They stated the PA was done on the wrong form and has to be done on a form label migraine calcitonin agents:nurtec. I stated nurse who did PA will be notified.

## 2020-02-11 ENCOUNTER — Other Ambulatory Visit: Payer: Self-pay

## 2020-02-11 ENCOUNTER — Telehealth: Payer: Self-pay | Admitting: Neurology

## 2020-02-11 ENCOUNTER — Ambulatory Visit (HOSPITAL_COMMUNITY): Payer: Medicaid Other | Attending: Cardiology

## 2020-02-11 DIAGNOSIS — G459 Transient cerebral ischemic attack, unspecified: Secondary | ICD-10-CM | POA: Diagnosis present

## 2020-02-11 MED ORDER — SODIUM CHLORIDE 0.9% FLUSH
20.0000 mL | INTRAVENOUS | Status: AC | PRN
  Administered 2020-02-11: 20 mL via INTRAVENOUS

## 2020-02-11 NOTE — Telephone Encounter (Signed)
Patient states she has had a migraine since yesterday. Pain Level is at 9 .  [phone]

## 2020-02-12 NOTE — Telephone Encounter (Addendum)
Confirmation #: [id number]  Prior Approval #: [id number]  Status: APPROVED 02/12/2020  Length of Therapy: 365 DAYS  Total Quantity: 120.000

## 2020-02-13 ENCOUNTER — Ambulatory Visit
Admission: RE | Admit: 2020-02-13 | Discharge: 2020-02-13 | Disposition: A | Discharge status: Home / Self Care | Payer: Medicaid Other | Source: Ambulatory Visit | Attending: Neurology | Admitting: Neurology

## 2020-02-13 DIAGNOSIS — R51 Headache with orthostatic component, not elsewhere classified: Secondary | ICD-10-CM | POA: Diagnosis not present

## 2020-02-13 DIAGNOSIS — G459 Transient cerebral ischemic attack, unspecified: Secondary | ICD-10-CM | POA: Diagnosis not present

## 2020-02-13 DIAGNOSIS — H539 Unspecified visual disturbance: Secondary | ICD-10-CM

## 2020-02-13 DIAGNOSIS — R200 Anesthesia of skin: Secondary | ICD-10-CM

## 2020-02-13 DIAGNOSIS — R519 Headache, unspecified: Secondary | ICD-10-CM

## 2020-02-13 MED ORDER — GADOBENATE DIMEGLUMINE 529 MG/ML IV SOLN
15.0000 mL | Freq: Once | INTRAVENOUS | Status: AC | PRN
  Administered 2020-02-13: 15 mL via INTRAVENOUS

## 2020-02-29 ENCOUNTER — Ambulatory Visit: Payer: Medicaid Other | Admitting: Nurse Practitioner

## 2020-04-16 ENCOUNTER — Telehealth: Payer: Self-pay | Admitting: Family Medicine

## 2020-04-16 ENCOUNTER — Ambulatory Visit: Payer: Medicaid Other | Admitting: Family Medicine

## 2020-04-16 ENCOUNTER — Encounter: Payer: Self-pay | Admitting: Family Medicine

## 2020-04-16 ENCOUNTER — Other Ambulatory Visit: Payer: Self-pay

## 2020-04-16 VITALS — BP 137/90 | HR 80 | Ht 62.0 in | Wt 180.0 lb

## 2020-04-16 DIAGNOSIS — G43709 Chronic migraine without aura, not intractable, without status migrainosus: Secondary | ICD-10-CM

## 2020-04-16 MED ORDER — NURTEC 75 MG PO TBDP: 75.0000 mg | ORAL_TABLET | Freq: Every day | ORAL | 11 refills | Status: DC | PRN

## 2020-04-16 NOTE — Patient Instructions (Signed)
We will continue Amovig and Nurtec. Please continue topiramate 100mg  twice daily as directed by PCP.   Follow up in 1 year, sooner if needed  Migraine Headache A migraine headache is a very strong throbbing pain on one side or both sides of your head. This type of headache can also cause other symptoms. It can last from 4 hours to 3 days. Talk with your doctor about what things may bring on (trigger) this condition. What are the causes? The exact cause of this condition is not known. This condition may be triggered or caused by:  Drinking alcohol.  Smoking.  Taking medicines, such as: ? Medicine used to treat chest pain (nitroglycerin). ? Birth control pills. ? Estrogen. ? Some blood pressure medicines.  Eating or drinking certain products.  Doing physical activity. Other things that may trigger a migraine headache include:  Having a menstrual period.  Pregnancy.  Hunger.  Stress.  Not getting enough sleep or getting too much sleep.  Weather changes.  Tiredness (fatigue). What increases the risk?  Being 49-51 years old.  Being female.  Having a family history of migraine headaches.  Being Caucasian.  Having depression or anxiety.  Being very overweight. What are the signs or symptoms?  A throbbing pain. This pain may: ? Happen in any area of the head, such as on one side or both sides. ? Make it hard to do daily activities. ? Get worse with physical activity. ? Get worse around bright lights or loud noises.  Other symptoms may include: ? Feeling sick to your stomach (nauseous). ? Vomiting. ? Dizziness. ? Being sensitive to bright lights, loud noises, or smells.  Before you get a migraine headache, you may get warning signs (an aura). An aura may include: ? Seeing flashing lights or having blind spots. ? Seeing bright spots, halos, or zigzag lines. ? Having tunnel vision or blurred vision. ? Having numbness or a tingling feeling. ? Having trouble  talking. ? Having weak muscles.  Some people have symptoms after a migraine headache (postdromal phase), such as: ? Tiredness. ? Trouble thinking (concentrating). How is this treated?  Taking medicines that: ? Relieve pain. ? Relieve the feeling of being sick to your stomach. ? Prevent migraine headaches.  Treatment may also include: ? Having acupuncture. ? Avoiding foods that bring on migraine headaches. ? Learning ways to control your body functions (biofeedback). ? Therapy to help you know and deal with negative thoughts (cognitive behavioral therapy). Follow these instructions at home: Medicines  Take over-the-counter and prescription medicines only as told by your doctor.  Ask your doctor if the medicine prescribed to you: ? Requires you to avoid driving or using heavy machinery. ? Can cause trouble pooping (constipation). You may need to take these steps to prevent or treat trouble pooping:  Drink enough fluid to keep your pee (urine) pale yellow.  Take over-the-counter or prescription medicines.  Eat foods that are high in fiber. These include beans, whole grains, and fresh fruits and vegetables.  Limit foods that are high in fat and sugar. These include fried or sweet foods. Lifestyle  Do not drink alcohol.  Do not use any products that contain nicotine or tobacco, such as cigarettes, e-cigarettes, and chewing tobacco. If you need help quitting, ask your doctor.  Get at least 8 hours of sleep every night.  Limit and deal with stress. General instructions      Keep a journal to find out what may bring on your migraine headaches.  For example, write down: ? What you eat and drink. ? How much sleep you get. ? Any change in what you eat or drink. ? Any change in your medicines.  If you have a migraine headache: ? Avoid things that make your symptoms worse, such as bright lights. ? It may help to lie down in a dark, quiet room. ? Do not drive or use heavy  machinery. ? Ask your doctor what activities are safe for you.  Keep all follow-up visits as told by your doctor. This is important. Contact a doctor if:  You get a migraine headache that is different or worse than others you have had.  You have more than 15 headache days in one month. Get help right away if:  Your migraine headache gets very bad.  Your migraine headache lasts longer than 72 hours.  You have a fever.  You have a stiff neck.  You have trouble seeing.  Your muscles feel weak or like you cannot control them.  You start to lose your balance a lot.  You start to have trouble walking.  You pass out (faint).  You have a seizure. Summary  A migraine headache is a very strong throbbing pain on one side or both sides of your head. These headaches can also cause other symptoms.  This condition may be treated with medicines and changes to your lifestyle.  Keep a journal to find out what may bring on your migraine headaches.  Contact a doctor if you get a migraine headache that is different or worse than others you have had.  Contact your doctor if you have more than 15 headache days in a month. This information is not intended to replace advice given to you by your health care provider. Make sure you discuss any questions you have with your health care provider. Document Revised: 12/08/2018 Document Reviewed: 09/28/2018 Elsevier Patient Education  [place].

## 2020-04-16 NOTE — Telephone Encounter (Signed)
When checking out for 08/18 appointment, pt stated she would call to make her yearly f/u.

## 2020-04-16 NOTE — Progress Notes (Addendum)
PATIENT: [name] DOB: 05-Oct-1985  REASON FOR VISIT: follow up HISTORY FROM: patient  Chief Complaint  Patient presents with  . Follow-up    F/U on migraines. She stated she has been doing well with the 3 medications.   . room 2    alone      HISTORY OF PRESENT ILLNESS: Today 04/16/20 [name] is a 34 y.o. female here today for follow up for headaches. MRI and echo normal. She was started on Amovig monthly and Nurtec for acute management at last visit in 12/2019. She also continues topiramate 100mg  BID (prescribed by PCP). She reports that headaches have improved significantly. She has taken Nurtec about 2-3 times per month. Migraine resolves within 2 hours of taking Nurtec. She denies any unusual events of numbness or confusion. She denies visual disturbances the precede headache.   HISTORY: (copied from Dr note on 01/11/2020)  HPI:  [name] is a 34 y.o. female here as requested by [facility] for "complicated migraine".  Past medical history anxiety, asthma, bipolar 1 disorder, cataract left eye, depression, GERD.  I reviewed 32 MD's notes: Patient presented to the emergency room on Jan 08, 2020,  she presented with chest pain and explained that she went to bed at 3 AM and woke up at 5:30 AM and noted the left side of her body felt numb or strange, she also experienced a headache, the numbness resolved after few hours but the remainder of the day she began experiencing sharp chest pain, constant, worse with breathing and continued headache, no neck pain or stiffness, no fever no IV drug use no bladder dysfunction, no abdominal pain, pain moderate to severe and constant.  She is a current every day smoker 1.25 pack years, she denied alcohol use but endorsed drug use including marijuana and barbiturates.  Her laboratory values showed her potassium was slightly decreased at 3.3, her white blood cells were elevated at 11.7 with  slightly increased hemoglobin as well 15.4, anion gap 16, glucose slightly elevated at 131, all else unremarkable.  She was given fluids, Benadryl injection, Compazine injection.  The physical and neurological exam were completely normal favoring complex migraine, D-dimer was negative, chest x-ray with no evidence of acute etiology and she felt better after the migraine cocktail.  CT of the head was normal.  She does have a long history of headaches and she was referred to neurology.  She is here alone. She has been having "real bad headaches for months". They have been getting worse. She woke up the other night about 530 and she was numb on one side the face, the left arm and leg, her grandmother noticed, she felt weird, her face is drooping, drooping lasted 4 hours, the numbness lasted an hour, she had a headache at the time, not a migraine but a "weird headache", this was not a common experience. She has severe migraines, photophobia/phonophobia, unilateral and spread bilaterally behind the eyes and then to the back of her head down through her neck. Pulsating/pounding/throbing. Daily headaches, 15 migraine days a month, they can last 12-24 hours.   Reviewed notes, labs and imaging from outside physicians, which showed:  Meds tried that can be used in migraine prevention/treatment: Propranolol, baclofen, zofran, topiramate, trazodone, maxalt, zofran, gabapentin, tylenol, can't take ibuprofen, tegretol, celexa, lamictal, robaxin, reglan, metoprolol, prednisone, sertraline, tramadol, effexor, imitrex,   01/08/2020:   Ct showed Normal CT head; No acute intracranial abnormalities including mass lesion  or mass effect, hydrocephalus, extra-axial fluid collection, midline shift, hemorrhage, or acute infarction, large ischemic events (personally reviewed images)    REVIEW OF SYSTEMS: Out of a complete 14 system review of symptoms, the patient complains only of the following symptoms, headaches and all  other reviewed systems are negative.   ALLERGIES: Allergies  Allergen Reactions  . Bee Venom Anaphylaxis and Swelling  . Coconut Flavor Anaphylaxis  . Ibuprofen Hives  . Naproxen Nausea And Vomiting  . Tramadol Other (See Comments)    unk    HOME MEDICATIONS: Outpatient Medications Prior to Visit  Medication Sig Dispense Refill  . brexpiprazole (REXULTI) 2 MG TABS tablet Take by mouth.    . clonazePAM (KLONOPIN) 1 MG tablet Take 1 mg by mouth 3 (three) times daily as needed for anxiety.     Dorise Hiss. Erenumab-aooe (AIMOVIG) 140 MG/ML SOAJ Inject 140 mg into the skin every 30 (thirty) days. 1 pen 11  . ondansetron (ZOFRAN-ODT) 8 MG disintegrating tablet Take 8 mg by mouth every 8 (eight) hours as needed for nausea or vomiting.    . pantoprazole (PROTONIX) 40 MG tablet Take 1 tablet (40 mg total) by mouth daily. 30 tablet 3  . SPRINTEC 28 0.25-35 MG-MCG tablet Take 1 tablet by mouth daily.    Marland Kitchen. topiramate (TOPAMAX) 100 MG tablet Take 100 mg by mouth 2 (two) times daily.    . valACYclovir (VALTREX) 500 MG tablet Take 500 mg by mouth daily.     . Vitamin D, Ergocalciferol, (DRISDOL) 1.25 MG (50000 UNIT) CAPS capsule Take 50,000 Units by mouth every 7 (seven) days.    . Rimegepant Sulfate (NURTEC) 75 MG TBDP Take 75 mg by mouth daily as needed. For migraines. Take as close to onset of migraine as possible. One daily maximum. 10 tablet 6  . baclofen (LIORESAL) 10 MG tablet Take 10 mg by mouth 3 (three) times daily.    Marland Kitchen. FLUoxetine (PROZAC) 20 MG capsule Take 20 mg by mouth daily.    . propranolol (INDERAL) 20 MG tablet Take 20 mg by mouth 3 (three) times daily.    . QUEtiapine (SEROQUEL) 300 MG tablet Take 300 mg by mouth at bedtime.     . Rimegepant Sulfate (NURTEC) 75 MG TBDP Take 75 mg by mouth daily. For migraines. Take as close to onset of migraine as possible. One daily maximum. 10 tablet 0  . rizatriptan (MAXALT) 5 MG tablet Take 5 mg by mouth as needed for migraine. May repeat in 2 hours if  needed    . rOPINIRole (REQUIP) 3 MG tablet Take 3 mg by mouth every evening.      Facility-Administered Medications Prior to Visit  Medication Dose Route Frequency Provider Last Rate Last Admin  . sodium chloride flush (NS) 0.9 % injection 20 mL  20 mL Intravenous PRN Anson FretAhern, [name] B, MD   20 mL at 02/11/20 1301    PAST MEDICAL HISTORY: Past Medical History:  Diagnosis Date  . Anxiety   . Asthma   . Bipolar 1 disorder (HCC)   . Cataract    left eye  . Depression   . Endometriosis   . GERD (gastroesophageal reflux disease)   . Mental disorder   . Migraine     PAST SURGICAL HISTORY: Past Surgical History:  Procedure Laterality Date  . ABDOMINAL SURGERY     "For endometriosis"  . CHOLECYSTECTOMY    . OOPHORECTOMY Right 2012  . TUBAL LIGATION      FAMILY HISTORY: Family  History  Problem Relation Age of Onset  . Stroke Mother   . Hyperlipidemia Other   . Colon cancer Neg Hx   . Stomach cancer Neg Hx   . Pancreatic cancer Neg Hx   . Esophageal cancer Neg Hx     SOCIAL HISTORY: Social History   Socioeconomic History  . Marital status: Legally Separated    Spouse name: Not on file  . Number of children: Not on file  . Years of education: Not on file  . Highest education level: Not on file  Occupational History  . Not on file  Tobacco Use  . Smoking status: Current Every Day Smoker    Packs/day: 0.25    Years: 5.00    Pack years: 1.25    Types: Cigarettes  . Smokeless tobacco: Never Used  Vaping Use  . Vaping Use: Never used  Substance and Sexual Activity  . Alcohol use: Never  . Drug use: Yes    Types: Marijuana, Barbituates    Comment: used weed 12 Jul 2019  . Sexual activity: Yes    Partners: Male    Birth control/protection: Surgical, Pill  Other Topics Concern  . Not on file  Social History Narrative  . Not on file   Social Determinants of Health   Financial Resource Strain:   . Difficulty of Paying Living Expenses:   Food Insecurity:     . Worried About Programme researcher, broadcasting/film/video in the Last Year:   . Barista in the Last Year:   Transportation Needs:   . Freight forwarder (Medical):   Marland Kitchen Lack of Transportation (Non-Medical):   Physical Activity:   . Days of Exercise per Week:   . Minutes of Exercise per Session:   Stress:   . Feeling of Stress :   Social Connections:   . Frequency of Communication with Friends and Family:   . Frequency of Social Gatherings with Friends and Family:   . Attends Religious Services:   . Active Member of Clubs or Organizations:   . Attends Banker Meetings:   Marland Kitchen Marital Status:   Intimate Partner Violence:   . Fear of Current or Ex-Partner:   . Emotionally Abused:   Marland Kitchen Physically Abused:   . Sexually Abused:       PHYSICAL EXAM  Vitals:   04/16/20 1354  BP: 137/90  Pulse: 80  Weight: 180 lb (81.6 kg)  Height: 5\' 2"  (1.575 m)   Body mass index is 32.92 kg/m.  Generalized: Well developed, in no acute distress  Cardiology: normal rate and rhythm, no murmur noted Respiratory: clear to auscultation bilaterally  Neurological examination  Mentation: Alert oriented to time, place, history taking. Follows all commands speech and language fluent Cranial nerve II-XII: Pupils were equal round reactive to light. Extraocular movements were full, visual field were full on confrontational test. Facial sensation and strength were normal. Uvula tongue midline. Head turning and shoulder shrug  were normal and symmetric. Motor: The motor testing reveals 5 over 5 strength of all 4 extremities. Good symmetric motor tone is noted throughout.  Sensory: Sensory testing is intact to soft touch on all 4 extremities. No evidence of extinction is noted.  Coordination: Cerebellar testing reveals good finger-nose-finger and heel-to-shin bilaterally.  Gait and station: Gait is normal.   DIAGNOSTIC DATA (LABS, IMAGING, TESTING) - I reviewed patient records, labs, notes, testing and  imaging myself where available.  No flowsheet data found.   Lab Results  Component Value Date   WBC 11.7 (H) 01/08/2020   HGB 15.4 (H) 01/08/2020   HCT 45.6 01/08/2020   MCV 100.0 01/08/2020   PLT 210 01/08/2020      Component Value Date/Time   NA 143 01/08/2020 1951   K 3.3 (L) 01/08/2020 1951   CL 109 01/08/2020 1951   CO2 18 (L) 01/08/2020 1951   GLUCOSE 131 (H) 01/08/2020 1951   BUN 15 01/08/2020 1951   CREATININE 0.83 01/08/2020 1951   CALCIUM 9.3 01/08/2020 1951   PROT 7.1 10/08/2019 1514   ALBUMIN 3.7 10/08/2019 1514   AST 24 10/08/2019 1514   ALT 22 10/08/2019 1514   ALKPHOS 90 10/08/2019 1514   BILITOT 0.5 10/08/2019 1514   GFRNONAA >60 01/08/2020 1951   GFRAA >60 01/08/2020 1951   Lab Results  Component Value Date   CHOL 191 01/11/2020   HDL 67 01/11/2020   LDLCALC 93 01/11/2020   TRIG 184 (H) 01/11/2020   CHOLHDL 2.9 01/11/2020   Lab Results  Component Value Date   HGBA1C 5.3 01/11/2020   No results found for: VITAMINB12 Lab Results  Component Value Date   TSH 2.840 03/13/2014       ASSESSMENT AND PLAN 34 y.o. year old female  has a past medical history of Anxiety, Asthma, Bipolar 1 disorder (HCC), Cataract, Depression, Endometriosis, GERD (gastroesophageal reflux disease), Mental disorder, and Migraine. here with     ICD-10-CM   1. Chronic migraine without aura without status migrainosus, not intractable  G43.709     Darlin is doing very well.  She reports that Aimovig and nonintact have helped significantly with headache management.  We will continue Aimovig monthly.  She will continue topiramate 100 mg twice daily as advised by her PCP.  We will continue Nurtec for abortive therapy.  She was encouraged to focus on healthy lifestyle habits.  She will follow-up with [place] in 1 year.  She verbalizes understanding and agreement with this plan.   No orders of the defined types were placed in this encounter.    Meds ordered this encounter    Medications  . Rimegepant Sulfate (NURTEC) 75 MG TBDP    Sig: Take 75 mg by mouth daily as needed. For migraines. Take as close to onset of migraine as possible. One daily maximum.    Dispense:  8 tablet    Refill:  11    Order Specific Question:   Supervising Provider    Answer:   Anson Fret [id number]      I spent 15 minutes with the patient. 50% of this time was spent counseling and educating patient on plan of care and medications.    Shawnie Dapper, FNP-C 04/16/2020, 2:25 PM [facility] [address], [address] [place], [place] [address] [phone]  Made any corrections needed, and agree with history, physical, neuro exam,assessment and plan as stated.     Naomie Dean, MD [facility]

## 2020-07-31 ENCOUNTER — Ambulatory Visit: Payer: Medicaid Other | Attending: Nurse Practitioner

## 2020-07-31 ENCOUNTER — Other Ambulatory Visit: Payer: Self-pay

## 2020-07-31 DIAGNOSIS — M545 Low back pain, unspecified: Secondary | ICD-10-CM | POA: Insufficient documentation

## 2020-07-31 DIAGNOSIS — G8929 Other chronic pain: Secondary | ICD-10-CM | POA: Diagnosis present

## 2020-07-31 DIAGNOSIS — R262 Difficulty in walking, not elsewhere classified: Secondary | ICD-10-CM | POA: Insufficient documentation

## 2020-07-31 DIAGNOSIS — M5386 Other specified dorsopathies, lumbar region: Secondary | ICD-10-CM | POA: Diagnosis present
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
## 2020-08-01 NOTE — Patient Instructions (Signed)
Forward trunk flexion in sitting 3-5 reps, 10 sec. Pt did not tolerate SKTC or DKTC in supine

## 2020-08-19 ENCOUNTER — Ambulatory Visit: Payer: Medicaid Other

## 2020-08-31 ENCOUNTER — Other Ambulatory Visit: Payer: Self-pay | Admitting: Internal Medicine

## 2020-10-31 ENCOUNTER — Telehealth: Payer: Self-pay | Admitting: Family Medicine

## 2020-10-31 NOTE — Telephone Encounter (Signed)
Pt is asking for a call from RN to discuss her trying to get her Aimovig for over a week now

## 2020-11-03 NOTE — Telephone Encounter (Signed)
I called pt and she states needs authorization form [place].  PA requested [place] tracks.

## 2020-11-03 NOTE — Telephone Encounter (Signed)
Initiated PA for [place] tracks Conf # [id number] W for Aimovig 140mg  / ml every 30 days. Determination pending.

## 2020-11-04 NOTE — Telephone Encounter (Signed)
Received approval for aimovig 11-03-20 thru 11-03-2021.  PRIOR APPROVAL # [id number].

## 2021-01-28 ENCOUNTER — Other Ambulatory Visit: Payer: Self-pay | Admitting: Internal Medicine

## 2021-01-28 ENCOUNTER — Other Ambulatory Visit: Payer: Self-pay | Admitting: Neurology

## 2021-01-28 DIAGNOSIS — G43709 Chronic migraine without aura, not intractable, without status migrainosus: Secondary | ICD-10-CM

## 2021-01-28 DIAGNOSIS — G43109 Migraine with aura, not intractable, without status migrainosus: Secondary | ICD-10-CM

## 2021-04-09 ENCOUNTER — Emergency Department (HOSPITAL_COMMUNITY)
Admission: EM | Admit: 2021-04-09 | Discharge: 2021-04-09 | Disposition: A | Discharge status: Home / Self Care | Payer: Medicaid Other | Attending: Emergency Medicine | Admitting: Emergency Medicine

## 2021-04-09 ENCOUNTER — Other Ambulatory Visit: Payer: Self-pay

## 2021-04-09 ENCOUNTER — Emergency Department (HOSPITAL_COMMUNITY): Payer: Medicaid Other

## 2021-04-09 ENCOUNTER — Encounter (HOSPITAL_COMMUNITY): Payer: Self-pay | Admitting: Emergency Medicine

## 2021-04-09 DIAGNOSIS — R0789 Other chest pain: Secondary | ICD-10-CM | POA: Insufficient documentation

## 2021-04-09 DIAGNOSIS — Z5321 Procedure and treatment not carried out due to patient leaving prior to being seen by health care provider: Secondary | ICD-10-CM | POA: Insufficient documentation

## 2021-04-09 DIAGNOSIS — R0602 Shortness of breath: Secondary | ICD-10-CM | POA: Diagnosis not present

## 2021-04-09 DIAGNOSIS — R519 Headache, unspecified: Secondary | ICD-10-CM | POA: Insufficient documentation

## 2021-04-09 LAB — I-STAT BETA HCG BLOOD, ED (MC, WL, AP ONLY): I-stat hCG, quantitative: <5 m[IU]/mL (ref <5)

## 2021-04-09 LAB — CBC WITH DIFFERENTIAL/PLATELET
Abs Immature Granulocytes: 0.01 10*3/uL (ref 0.00–0.07)
Basophils Absolute: 0 10*3/uL (ref 0.0–0.1)
Basophils Relative: 1 %
Eosinophils Absolute: 0.1 10*3/uL (ref 0.0–0.5)
Eosinophils Relative: 2 %
HCT: 40.9 % (ref 36.0–46.0)
Hemoglobin: 14.3 g/dL (ref 12.0–15.0)
Immature Granulocytes: 0 %
Lymphocytes Relative: 34 %
Lymphs Abs: 1.9 10*3/uL (ref 0.7–4.0)
MCH: 33.6 pg (ref 26.0–34.0)
MCHC: 35 g/dL (ref 30.0–36.0)
MCV: 96.2 fL (ref 80.0–100.0)
Monocytes Absolute: 0.5 10*3/uL (ref 0.1–1.0)
Monocytes Relative: 9 %
Neutro Abs: 3.1 10*3/uL (ref 1.7–7.7)
Neutrophils Relative %: 54 %
Platelets: 241 10*3/uL (ref 150–400)
RBC: 4.25 MIL/uL (ref 3.87–5.11)
RDW: 13.3 % (ref 11.5–15.5)
WBC: 5.6 10*3/uL (ref 4.0–10.5)
nRBC: 0 % (ref 0.0–0.2)

## 2021-04-09 LAB — BASIC METABOLIC PANEL
Anion gap: 7 (ref 5–15)
BUN: 7 mg/dL (ref 6–20)
CO2: 20 mmol/L — ABNORMAL LOW (ref 22–32)
Calcium: 9.1 mg/dL (ref 8.9–10.3)
Chloride: 110 mmol/L (ref 98–111)
Creatinine, Ser: 0.77 mg/dL (ref 0.44–1.00)
GFR, Estimated: >60 mL/min (ref >60)
Glucose, Bld: 96 mg/dL (ref 70–99)
Potassium: 3.3 mmol/L — ABNORMAL LOW (ref 3.5–5.1)
Sodium: 137 mmol/L (ref 135–145)

## 2021-04-09 LAB — TROPONIN I (HIGH SENSITIVITY): Troponin I (High Sensitivity): 6 ng/L (ref <18)

## 2021-04-09 NOTE — ED Provider Notes (Signed)
Emergency Medicine Provider Triage Evaluation Note  [name] , a 34 y.o. female  was evaluated in triage.  Pt complains of left-sided chest pain.  Pain began this morning at 0 200 and has been constant since then.  Pain radiates to left arm.  Pain described as a pressure.  Patient endorses associated shortness of breath.  Patient denies any diaphoresis, nausea, vomiting, abdominal pain, back pain, leg swelling, or palpitations.  Edition the patient complains of headache.  Patient states that headache started approximately 0 300.  Pain is located to frontal temporal aspect of her head.  No relief with prescribed migraine medication or Tylenol.  Denies any visual disturbance.  Patient denies any history of DVT/PE, hormone therapy, surgery last 4 weeks, cancer treatment, unilateral leg swelling or tenderness.  Review of Systems  Positive: Chest pain, shortness of breath, headache Negative: diaphoresis, nausea, vomiting, abdominal pain, back pain, leg swelling, or palpitations.  Physical Exam  BP 113/84 (BP Location: Left Arm)   Pulse 83   Temp 98.5 F (36.9 C) (Oral)   Resp 18   SpO2 100%  Gen:   Awake, no distress   Resp:  Normal effort, lungs clear to auscultation bilaterally MSK:   Moves extremities without difficulty, no swelling or tenderness to bilateral lower extremities. Other:  Abdomen soft, nondistended, nontender, no mass, no pulsatile mass, no guarding, no rebound tenderness.  Tenderness to left chest wall upon palpation.  Medical Decision Making  Medically screening exam initiated at 4:29 PM.  Appropriate orders placed.  [name] was informed that the remainder of the evaluation will be completed by another provider, this initial triage assessment does not replace that evaluation, and the importance of remaining in the ED until their evaluation is complete.  The patient appears stable so that the remainder of the work up may be completed by another provider.       Haskel Schroeder, PA-C 04/09/21 1631    Charlynne Pander, MD 04/09/21 [phone]

## 2021-04-09 NOTE — ED Triage Notes (Signed)
Pt c/o chest pressure that radiates to her left arm and back. Pain started this morning at 0200 and has been constant since. Also reports shortness of breath that is unchanged after inhaler use.

## 2021-04-24 ENCOUNTER — Emergency Department (HOSPITAL_COMMUNITY)
Admission: EM | Admit: 2021-04-24 | Discharge: 2021-04-25 | Disposition: A | Discharge status: Home / Self Care | Payer: Medicaid Other | Attending: Emergency Medicine | Admitting: Emergency Medicine

## 2021-04-24 ENCOUNTER — Ambulatory Visit (HOSPITAL_COMMUNITY)
Admission: EM | Admit: 2021-04-24 | Discharge: 2021-04-24 | Disposition: A | Discharge status: Home / Self Care | Payer: Medicaid Other | Attending: Internal Medicine | Admitting: Internal Medicine

## 2021-04-24 ENCOUNTER — Encounter (HOSPITAL_COMMUNITY): Payer: Self-pay | Admitting: Medical Oncology

## 2021-04-24 ENCOUNTER — Other Ambulatory Visit: Payer: Self-pay

## 2021-04-24 ENCOUNTER — Encounter (HOSPITAL_COMMUNITY): Payer: Self-pay | Admitting: Emergency Medicine

## 2021-04-24 DIAGNOSIS — R112 Nausea with vomiting, unspecified: Secondary | ICD-10-CM | POA: Diagnosis not present

## 2021-04-24 DIAGNOSIS — R1031 Right lower quadrant pain: Secondary | ICD-10-CM | POA: Diagnosis not present

## 2021-04-24 DIAGNOSIS — K219 Gastro-esophageal reflux disease without esophagitis: Secondary | ICD-10-CM | POA: Insufficient documentation

## 2021-04-24 DIAGNOSIS — R197 Diarrhea, unspecified: Secondary | ICD-10-CM

## 2021-04-24 DIAGNOSIS — J45909 Unspecified asthma, uncomplicated: Secondary | ICD-10-CM | POA: Diagnosis not present

## 2021-04-24 DIAGNOSIS — R509 Fever, unspecified: Secondary | ICD-10-CM

## 2021-04-24 DIAGNOSIS — F17210 Nicotine dependence, cigarettes, uncomplicated: Secondary | ICD-10-CM | POA: Diagnosis not present

## 2021-04-24 LAB — CBC WITH DIFFERENTIAL/PLATELET
Abs Immature Granulocytes: 0.01 10*3/uL (ref 0.00–0.07)
Basophils Absolute: 0.1 10*3/uL (ref 0.0–0.1)
Basophils Relative: 1 %
Eosinophils Absolute: 0 10*3/uL (ref 0.0–0.5)
Eosinophils Relative: 0 %
HCT: 46.6 % — ABNORMAL HIGH (ref 36.0–46.0)
Hemoglobin: 16 g/dL — ABNORMAL HIGH (ref 12.0–15.0)
Immature Granulocytes: 0 %
Lymphocytes Relative: 34 %
Lymphs Abs: 2.5 10*3/uL (ref 0.7–4.0)
MCH: 32.7 pg (ref 26.0–34.0)
MCHC: 34.3 g/dL (ref 30.0–36.0)
MCV: 95.1 fL (ref 80.0–100.0)
Monocytes Absolute: 0.6 10*3/uL (ref 0.1–1.0)
Monocytes Relative: 8 %
Neutro Abs: 4.3 10*3/uL (ref 1.7–7.7)
Neutrophils Relative %: 57 %
Platelets: 269 10*3/uL (ref 150–400)
RBC: 4.9 MIL/uL (ref 3.87–5.11)
RDW: 13.2 % (ref 11.5–15.5)
WBC: 7.5 10*3/uL (ref 4.0–10.5)
nRBC: 0 % (ref 0.0–0.2)

## 2021-04-24 LAB — COMPREHENSIVE METABOLIC PANEL
ALT: 10 U/L (ref 0–44)
AST: 13 U/L — ABNORMAL LOW (ref 15–41)
Albumin: 4.2 g/dL (ref 3.5–5.0)
Alkaline Phosphatase: 87 U/L (ref 38–126)
Anion gap: 11 (ref 5–15)
BUN: 9 mg/dL (ref 6–20)
CO2: 24 mmol/L (ref 22–32)
Calcium: 9.5 mg/dL (ref 8.9–10.3)
Chloride: 102 mmol/L (ref 98–111)
Creatinine, Ser: 0.89 mg/dL (ref 0.44–1.00)
GFR, Estimated: >60 mL/min (ref >60)
Glucose, Bld: 95 mg/dL (ref 70–99)
Potassium: 3.2 mmol/L — ABNORMAL LOW (ref 3.5–5.1)
Sodium: 137 mmol/L (ref 135–145)
Total Bilirubin: 0.9 mg/dL (ref 0.3–1.2)
Total Protein: 7.1 g/dL (ref 6.5–8.1)

## 2021-04-24 LAB — POCT URINALYSIS DIPSTICK, ED / UC
Glucose, UA: NEGATIVE mg/dL
Hgb urine dipstick: NEGATIVE
Ketones, ur: 15 mg/dL — AB
Leukocytes,Ua: NEGATIVE
Nitrite: NEGATIVE
Protein, ur: NEGATIVE mg/dL
Specific Gravity, Urine: 1.01 (ref 1.005–1.030)
Urobilinogen, UA: 1 mg/dL (ref 0.0–1.0)
pH: 6.5 (ref 5.0–8.0)

## 2021-04-24 LAB — POC URINE PREG, ED: Preg Test, Ur: NEGATIVE

## 2021-04-24 LAB — LIPASE, BLOOD: Lipase: 22 U/L (ref 11–51)

## 2021-04-24 NOTE — ED Provider Notes (Addendum)
[facility]    CSN: [id number] Arrival date & time: 04/24/21  1757      History   Chief Complaint Chief Complaint  Patient presents with   Emesis   Back Pain    HPI [name] is a 35 y.o. female.   HPI  Abdominal Pain: Patient reports that for the past week she has had vomiting, diarrhea, abdominal pain and back pain.  She reports that she has not been able to eat anything and has thrown up everything she is tried to drink for the past week.  She has had fevers with unknown T-max.  No bloody movements, dysuria or vomiting.  She was seen by her PCP and started on Protonix and Zofran without any improvement in symptoms. She denies any changes to meds or substances.   Past Medical History:  Diagnosis Date   Anxiety    Asthma    Bipolar 1 disorder ([facility])    Cataract    left eye   Depression    Endometriosis    GERD (gastroesophageal reflux disease)    Mental disorder    Migraine     Patient Active Problem List   Diagnosis Date Noted   Hiatal hernia 10/10/2019   Bipolar 1 disorder, depressed, severe ([facility]) 02/22/2019   Severe recurrent major depression with psychotic features ([facility]) 07/23/2017   Substance use disorder 07/23/2017   Pelvic pain in female 01/15/2015   MDD (major depressive disorder), recurrent episode, severe ([facility]) 03/11/2014   MDD (major depressive disorder) 03/11/2014   Bipolar disorder with depression ([facility]) 09/14/2013   Major depressive disorder, recurrent episode, severe ([facility]) 12/30/2011    Class: Stage 3   Bipolar affective disorder, depressed, severe ([facility]) 12/30/2011    Class: Acute    Past Surgical History:  Procedure Laterality Date   ABDOMINAL SURGERY     "For endometriosis"   CHOLECYSTECTOMY     OOPHORECTOMY Right 2012   TUBAL LIGATION      OB History     Gravida  4   Para  2   Term  2   Preterm  0   AB  2   Living  3      SAB  2   IAB  0   Ectopic  0   Multiple  0   Live Births  1             Home Medications    Prior to Admission medications   Medication Sig Start Date End Date Taking? Authorizing Provider  brexpiprazole (REXULTI) 2 MG TABS tablet Take by mouth.    [provider]  clonazePAM (KLONOPIN) 1 MG tablet Take 1 mg by mouth 3 (three) times daily as needed for anxiety.  05/21/19   [provider]  Erenumab-aooe (AIMOVIG) 140 MG/ML SOAJ Inject 140 mg into the skin every 30 (thirty) days. Appointment needed for further refills. 01/28/21   Anson Fret, MD  ondansetron (ZOFRAN-ODT) 8 MG disintegrating tablet Take 8 mg by mouth every 8 (eight) hours as needed for nausea or vomiting.    [provider]  pantoprazole (PROTONIX) 40 MG tablet Take 1 tablet (40 mg total) by mouth daily. Patient needs office visit for further refills 01/28/21   Tressia Danas, MD  Rimegepant Sulfate (NURTEC) 75 MG TBDP Take 75 mg by mouth daily as needed. For migraines. Take as close to onset of migraine as possible. One daily maximum. 04/16/20   Shawnie Dapper, NP  SPRINTEC 28 0.25-35  MG-MCG tablet Take 1 tablet by mouth daily. 03/26/19   [provider]  topiramate (TOPAMAX) 100 MG tablet Take 100 mg by mouth 2 (two) times daily.    [provider]  valACYclovir (VALTREX) 500 MG tablet Take 500 mg by mouth daily.     [provider]  Vitamin D, Ergocalciferol, (DRISDOL) 1.25 MG (50000 UNIT) CAPS capsule Take 50,000 Units by mouth every 7 (seven) days.    [provider]    Family History Family History  Problem Relation Age of Onset   Stroke Mother    Hyperlipidemia Other    Colon cancer Neg Hx    Stomach cancer Neg Hx    Pancreatic cancer Neg Hx    Esophageal cancer Neg Hx     Social History Social History   Tobacco Use   Smoking status: Every Day    Packs/day: 0.25    Years: 5.00    Pack years: 1.25    Types: Cigarettes   Smokeless tobacco: Never  Vaping Use   Vaping Use: Never used  Substance Use Topics    Alcohol use: Never   Drug use: Yes    Types: Marijuana, Barbituates    Comment: used weed 12 Jul 2019     Allergies   Bee venom, Coconut flavor, Ibuprofen, Naproxen, and Tramadol   Review of Systems Review of Systems  As stated above HPI Physical Exam Triage Vital Signs ED Triage Vitals  Enc Vitals Group     BP 04/24/21 1909 113/76     Pulse Rate 04/24/21 1909 89     Resp 04/24/21 1909 20     Temp 04/24/21 1909 99 F (37.2 C)     Temp src --      SpO2 04/24/21 1909 98 %     Weight --      Height --      Head Circumference --      Peak Flow --      Pain Score 04/24/21 1904 10     Pain Loc --      Pain Edu? --      Excl. in GC? --    No data found.  Updated Vital Signs BP 113/76   Pulse 89   Temp 99 F (37.2 C)   Resp 20   LMP 03/14/2021 (Approximate)   SpO2 98%   Physical Exam Vitals and nursing note reviewed.  Constitutional:      Comments: Patient curled with a blanket in the fetal position on examination table  HENT:     Head: Normocephalic and atraumatic.  Eyes:     Extraocular Movements: Extraocular movements intact.     Pupils: Pupils are equal, round, and reactive to light.  Cardiovascular:     Rate and Rhythm: Normal rate and regular rhythm.     Heart sounds: Normal heart sounds.  Pulmonary:     Effort: Pulmonary effort is normal.     Breath sounds: Normal breath sounds.  Abdominal:     General: Bowel sounds are normal. There is distension.     Palpations: Abdomen is soft. There is no mass.     Tenderness: There is abdominal tenderness. There is right CVA tenderness, guarding and rebound. There is no left CVA tenderness.     Hernia: No hernia is present.  Musculoskeletal:     Cervical back: Normal range of motion and neck supple.  Lymphadenopathy:     Cervical: No cervical adenopathy.  Skin:    General:  Skin is warm.     Coloration: Skin is not jaundiced or pale.  Neurological:     Mental Status: She is alert and oriented to person,  place, and time.     UC Treatments / Results  Labs (all labs ordered are listed, but only abnormal results are displayed) Labs Reviewed  POCT URINALYSIS DIPSTICK, ED / UC - Abnormal; Notable for the following components:      Result Value   Bilirubin Urine SMALL (*)    Ketones, ur 15 (*)    All other components within normal limits  POC URINE PREG, ED    EKG   Radiology No results found.  Procedures Procedures (including critical care time)  Medications Ordered in UC Medications - No data to display  Initial Impression / Assessment and Plan / UC Course  I have reviewed the triage vital signs and the nursing notes.  Pertinent labs & imaging results that were available during my care of the patient were reviewed by me and considered in my medical decision making (see chart for details).     New.  hCG is negative.  Urinalysis shows dehydration but no sign of a urinary tract infection or kidney stone.  I am concerned about a potential appendicitis versus obstruction versus other.  She will need to go to the hospital for further work-up given her fever, abdominal pain and intractable vomiting.  Patient agreeable.  She elects private transport by a friend. She will be NPO Final Clinical Impressions(s) / UC Diagnoses   Final diagnoses:  None   Discharge Instructions   None    ED Prescriptions   None    PDMP not reviewed this encounter.   Rushie Chestnut, Cordelia Poche 04/24/21 1937    Rushie Chestnut, PA-C 04/24/21 1939

## 2021-04-24 NOTE — ED Triage Notes (Signed)
Pt sent from urgent care, pt reports not being able to keep any food down for a week, right sided lower abdominal pain that goes to her back.  "I feel like I'm dying."

## 2021-04-24 NOTE — ED Triage Notes (Signed)
Pt reports vomiting and unable to eat for over a week.

## 2021-04-24 NOTE — Discharge Instructions (Addendum)
Please do not have anything to eat or drink until you are told that this is okay by ER staff.  Please go directly to the emergency room.

## 2021-04-24 NOTE — ED Provider Notes (Signed)
Emergency Medicine Provider Triage Evaluation Note  [name] , a 35 y.o. female  was evaluated in triage.  Pt complains of abdominal pain that is right-sided and emesis.  She was seen at urgent care prior to her arrival here and referred here for 7 concern of appendicitis, versus nephrolithiasis/pyelonephritis.   Over the past week she has had nausea, vomiting, abdominal pain and back pain.  No new fevers.  Review of Systems  Positive: Abdominal pain, vomiting,  Negative: fevers  Physical Exam  There were no vitals taken for this visit. Gen:   Awake, no distress   Resp:  Normal effort  MSK:   Moves extremities without difficulty  Other:  Patient appears uncomfortable.   Medical Decision Making  Medically screening exam initiated at 9:12 PM.  Appropriate orders placed.  [name] was informed that the remainder of the evaluation will be completed by another provider, this initial triage assessment does not replace that evaluation, and the importance of remaining in the ED until their evaluation is complete.  Note: Portions of this report may have been transcribed using voice recognition software. Every effort was made to ensure accuracy; however, inadvertent computerized transcription errors may be present    [name] 04/24/21 2117    Cheryll Cockayne, MD 04/27/21 [phone]

## 2021-04-25 ENCOUNTER — Emergency Department (HOSPITAL_COMMUNITY): Payer: Medicaid Other

## 2021-04-25 MED ORDER — PROMETHAZINE HCL 25 MG RE SUPP: 25.0000 mg | Freq: Four times a day (QID) | RECTAL | 0 refills | Status: DC | PRN

## 2021-04-25 MED ORDER — FENTANYL CITRATE PF 50 MCG/ML IJ SOSY
50.0000 ug | PREFILLED_SYRINGE | Freq: Once | INTRAMUSCULAR | Status: AC
  Administered 2021-04-25: 50 ug via INTRAVENOUS
  Filled 2021-04-25: qty 1

## 2021-04-25 MED ORDER — IOHEXOL 350 MG/ML SOLN
75.0000 mL | Freq: Once | INTRAVENOUS | Status: AC | PRN
  Administered 2021-04-25: 75 mL via INTRAVENOUS

## 2021-04-25 MED ORDER — PROMETHAZINE HCL 25 MG PO TABS: 25.0000 mg | ORAL_TABLET | Freq: Four times a day (QID) | ORAL | 0 refills | Status: DC | PRN

## 2021-04-25 MED ORDER — SODIUM CHLORIDE 0.9 % IV SOLN
12.5000 mg | Freq: Once | INTRAVENOUS | Status: AC
  Administered 2021-04-25: 12.5 mg via INTRAVENOUS
  Filled 2021-04-25: qty 0.5

## 2021-04-25 MED ORDER — LACTATED RINGERS IV BOLUS
2000.0000 mL | Freq: Once | INTRAVENOUS | Status: AC
  Administered 2021-04-25: 2000 mL via INTRAVENOUS

## 2021-04-25 NOTE — ED Notes (Signed)
Spoke to pt, informed her she is the next pt to get a room. She was satisfied w/ this information.

## 2021-04-25 NOTE — ED Provider Notes (Signed)
[facility] EMERGENCY DEPARTMENT Provider Note   CSN: [id number] Arrival date & time: 04/24/21  1956     History Chief Complaint  Patient presents with   Abdominal Pain   Emesis    [name] is a 35 y.o. female.   Abdominal Pain Pain location:  RLQ Pain quality: aching and pressure   Pain radiates to:  R flank Pain severity:  Mild Onset quality:  Gradual Duration:  5 days Timing:  Constant Progression:  Worsening Chronicity:  New Relieved by:  Nothing Worsened by:  Nothing Ineffective treatments:  None tried Associated symptoms: vomiting   Associated symptoms: no anorexia, no fatigue and no fever   Emesis Associated symptoms: abdominal pain   Associated symptoms: no fever       Past Medical History:  Diagnosis Date   Anxiety    Asthma    Bipolar 1 disorder (HCC)    Cataract    left eye   Depression    Endometriosis    GERD (gastroesophageal reflux disease)    Mental disorder    Migraine     Patient Active Problem List   Diagnosis Date Noted   Hiatal hernia 10/10/2019   Bipolar 1 disorder, depressed, severe (HCC) 02/22/2019   Severe recurrent major depression with psychotic features (HCC) 07/23/2017   Substance use disorder 07/23/2017   Pelvic pain in female 01/15/2015   MDD (major depressive disorder), recurrent episode, severe (HCC) 03/11/2014   MDD (major depressive disorder) 03/11/2014   Bipolar disorder with depression (HCC) 09/14/2013   Major depressive disorder, recurrent episode, severe (HCC) 12/30/2011    Class: Stage 3   Bipolar affective disorder, depressed, severe (HCC) 12/30/2011    Class: Acute    Past Surgical History:  Procedure Laterality Date   ABDOMINAL SURGERY     "For endometriosis"   CHOLECYSTECTOMY     OOPHORECTOMY Right 2012   TUBAL LIGATION       OB History     Gravida  4   Para  2   Term  2   Preterm  0   AB  2   Living  3      SAB  2   IAB  0   Ectopic  0   Multiple   0   Live Births  1           Family History  Problem Relation Age of Onset   Stroke Mother    Hyperlipidemia Other    Colon cancer Neg Hx    Stomach cancer Neg Hx    Pancreatic cancer Neg Hx    Esophageal cancer Neg Hx     Social History   Tobacco Use   Smoking status: Every Day    Packs/day: 0.25    Years: 5.00    Pack years: 1.25    Types: Cigarettes   Smokeless tobacco: Never  Vaping Use   Vaping Use: Never used  Substance Use Topics   Alcohol use: Never   Drug use: Yes    Types: Marijuana, Barbituates    Comment: used weed 12 Jul 2019    Home Medications Prior to Admission medications   Medication Sig Start Date End Date Taking? Authorizing Provider  promethazine (PHENERGAN) 25 MG suppository Place 1 suppository (25 mg total) rectally every 6 (six) hours as needed for nausea or vomiting. 04/25/21  Yes Monserrat Vidaurri, Barbara Cower, MD  promethazine (PHENERGAN) 25 MG tablet Take 1 tablet (25 mg total) by mouth every 6 (six)  hours as needed for nausea or vomiting. 04/25/21  Yes Julius Boniface, Barbara Cower, MD  brexpiprazole (REXULTI) 2 MG TABS tablet Take by mouth.    [provider]  clonazePAM (KLONOPIN) 1 MG tablet Take 1 mg by mouth 3 (three) times daily as needed for anxiety.  05/21/19   [provider]  Erenumab-aooe (AIMOVIG) 140 MG/ML SOAJ Inject 140 mg into the skin every 30 (thirty) days. Appointment needed for further refills. 01/28/21   Anson Fret, MD  ondansetron (ZOFRAN-ODT) 8 MG disintegrating tablet Take 8 mg by mouth every 8 (eight) hours as needed for nausea or vomiting.    [provider]  pantoprazole (PROTONIX) 40 MG tablet Take 1 tablet (40 mg total) by mouth daily. Patient needs office visit for further refills 01/28/21   Tressia Danas, MD  Rimegepant Sulfate (NURTEC) 75 MG TBDP Take 75 mg by mouth daily as needed. For migraines. Take as close to onset of migraine as possible. One daily maximum. 04/16/20   Lomax, Amy, NP  SPRINTEC 28 0.25-35  MG-MCG tablet Take 1 tablet by mouth daily. 03/26/19   [provider]  topiramate (TOPAMAX) 100 MG tablet Take 100 mg by mouth 2 (two) times daily.    [provider]  valACYclovir (VALTREX) 500 MG tablet Take 500 mg by mouth daily.     [provider]  Vitamin D, Ergocalciferol, (DRISDOL) 1.25 MG (50000 UNIT) CAPS capsule Take 50,000 Units by mouth every 7 (seven) days.    [provider]    Allergies    Bee venom, Coconut flavor, Ibuprofen, Naproxen, and Tramadol  Review of Systems   Review of Systems  Constitutional:  Negative for fatigue and fever.  Gastrointestinal:  Positive for abdominal pain and vomiting. Negative for anorexia.  All other systems reviewed and are negative.  Physical Exam Updated Vital Signs BP 117/68 (BP Location: Right Arm)   Pulse 70   Temp 98.7 F (37.1 C) (Oral)   Resp 16   Ht 5\' 2"  (1.575 m)   SpO2 98%   BMI 32.92 kg/m   Physical Exam Vitals and nursing note reviewed.  Constitutional:      Appearance: She is well-developed.  HENT:     Head: Normocephalic and atraumatic.  Eyes:     Pupils: Pupils are equal, round, and reactive to light.  Cardiovascular:     Rate and Rhythm: Normal rate and regular rhythm.  Pulmonary:     Effort: Pulmonary effort is normal. No respiratory distress.     Breath sounds: No stridor.  Abdominal:     General: Abdomen is flat. There is no distension.     Tenderness: There is no abdominal tenderness. There is no guarding or rebound.     Hernia: No hernia is present.  Musculoskeletal:        General: No swelling or tenderness. Normal range of motion.     Cervical back: Normal range of motion.  Skin:    General: Skin is warm and dry.  Neurological:     General: No focal deficit present.     Mental Status: She is alert.    ED Results / Procedures / Treatments   Labs (all labs ordered are listed, but only abnormal results are displayed) Labs Reviewed  CBC WITH  DIFFERENTIAL/PLATELET - Abnormal; Notable for the following components:      Result Value   Hemoglobin 16.0 (*)    HCT 46.6 (*)    All other components within normal limits  COMPREHENSIVE  METABOLIC PANEL - Abnormal; Notable for the following components:   Potassium 3.2 (*)    AST 13 (*)    All other components within normal limits  LIPASE, BLOOD  URINALYSIS, ROUTINE W REFLEX MICROSCOPIC  I-STAT BETA HCG BLOOD, ED ([facility], [facility], [facility] ONLY)    EKG None  Radiology CT ABDOMEN PELVIS W CONTRAST  Result Date: 04/25/2021 CLINICAL DATA:  35 year old female with abdominal pain. EXAM: CT ABDOMEN AND PELVIS WITH CONTRAST TECHNIQUE: Multidetector CT imaging of the abdomen and pelvis was performed using the standard protocol following bolus administration of intravenous contrast. CONTRAST:  75mL OMNIPAQUE IOHEXOL 350 MG/ML SOLN COMPARISON:  CT Abdomen and Pelvis 02/17/2019 and earlier. FINDINGS: Lower chest: Small gastric hiatal hernia, otherwise negative. Hepatobiliary: Chronically absent gallbladder.  Negative liver. Pancreas: Negative. Spleen: Negative; occasional punctate splenic granulomas. Adrenals/Urinary Tract: Normal adrenal glands. Renal enhancement is symmetric and normal. No hydronephrosis or nephrolithiasis. Decompressed ureters. Unremarkable bladder. Chronic pelvic phleboliths. Stomach/Bowel: Decompressed rectosigmoid colon in the pelvis, similar to the 2020 CT. However, the descending and more proximal large bowel segments today appear normal. Normal appendix on series 3, image 54. Terminal ileum is within normal limits. No dilated small bowel. Chronic small gastric hiatal hernia. Stomach and duodenum otherwise within normal limits. No perigastric inflammation identified. No free air, free fluid or mesenteric inflammation. Vascular/Lymphatic: Major arterial structures appear patent and normal. Portal venous system appears to be patent. Reproductive: More capacious appearance of vagina containing some  gas. But otherwise negative. Other: No pelvic free fluid. Musculoskeletal: New or increased lower lumbar disc bulging at L4-L5. There is subchondral sclerosis and lucency of the right femoral head which is new since 2020 in keeping with femoral head avascular necrosis (coronal image 43). No other acute osseous abnormality identified. IMPRESSION: 1. 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 No other acute osseous abnormality. 2. No other acute or inflammatory process identified in the abdomen or pelvis. Small chronic gastric hiatal hernia. Normal appendix. Electronically Signed   By: Odessa FlemingH  Hall M.D.   On: 04/25/2021 04:41    Procedures Procedures   Medications Ordered in ED Medications  lactated ringers bolus 2,000 mL (0 mLs Intravenous Stopped 04/25/21 0619)  promethazine (PHENERGAN) 12.5 mg in sodium chloride 0.9 % 50 mL IVPB (0 mg Intravenous Stopped 04/25/21 0458)  fentaNYL (SUBLIMAZE) injection 50 mcg (50 mcg Intravenous Given 04/25/21 0432)  iohexol (OMNIPAQUE) 350 MG/ML injection 75 mL (75 mLs Intravenous Contrast Given 04/25/21 0413)    ED Course  I have reviewed the triage vital signs and the nursing notes.  Pertinent labs & imaging results that were available during my care of the patient were reviewed by me and considered in my medical decision making (see chart for details).    MDM Rules/Calculators/A&P                         Abdominal pain unclear etiology. Nausea and vomiting improved here with meds, tolerating PO. Labs/ct ok. Appears stable for discharge.   Final Clinical Impression(s) / ED Diagnoses Final diagnoses:  Non-intractable vomiting with nausea, unspecified vomiting type    Rx / DC Orders ED Discharge Orders          Ordered    promethazine (PHENERGAN) 25 MG suppository  Every 6 hours PRN        04/25/21 0638    promethazine (PHENERGAN) 25 MG tablet  Every 6 hours PRN        04/25/21 [id number]  Kynzlie Hilleary, Barbara Cower, MD 04/25/21 [phone]

## 2021-04-27 ENCOUNTER — Emergency Department (HOSPITAL_COMMUNITY)
Admission: EM | Admit: 2021-04-27 | Discharge: 2021-04-27 | Disposition: A | Discharge status: Home / Self Care | Payer: Medicaid Other | Attending: Emergency Medicine | Admitting: Emergency Medicine

## 2021-04-27 ENCOUNTER — Encounter (HOSPITAL_COMMUNITY): Payer: Self-pay | Admitting: Emergency Medicine

## 2021-04-27 ENCOUNTER — Other Ambulatory Visit: Payer: Self-pay

## 2021-04-27 DIAGNOSIS — Z79899 Other long term (current) drug therapy: Secondary | ICD-10-CM | POA: Diagnosis not present

## 2021-04-27 DIAGNOSIS — N9489 Other specified conditions associated with female genital organs and menstrual cycle: Secondary | ICD-10-CM | POA: Insufficient documentation

## 2021-04-27 DIAGNOSIS — R109 Unspecified abdominal pain: Secondary | ICD-10-CM | POA: Insufficient documentation

## 2021-04-27 DIAGNOSIS — R1115 Cyclical vomiting syndrome unrelated to migraine: Secondary | ICD-10-CM

## 2021-04-27 DIAGNOSIS — R1110 Vomiting, unspecified: Secondary | ICD-10-CM | POA: Diagnosis present

## 2021-04-27 DIAGNOSIS — J45909 Unspecified asthma, uncomplicated: Secondary | ICD-10-CM | POA: Diagnosis not present

## 2021-04-27 DIAGNOSIS — F17210 Nicotine dependence, cigarettes, uncomplicated: Secondary | ICD-10-CM | POA: Diagnosis not present

## 2021-04-27 LAB — URINALYSIS, ROUTINE W REFLEX MICROSCOPIC
Bilirubin Urine: NEGATIVE
Glucose, UA: NEGATIVE mg/dL
Hgb urine dipstick: NEGATIVE
Ketones, ur: 5 mg/dL — AB
Leukocytes,Ua: NEGATIVE
Nitrite: NEGATIVE
Protein, ur: NEGATIVE mg/dL
Specific Gravity, Urine: 1.024 (ref 1.005–1.030)
pH: 7 (ref 5.0–8.0)

## 2021-04-27 LAB — I-STAT BETA HCG BLOOD, ED (MC, WL, AP ONLY): I-stat hCG, quantitative: <5 m[IU]/mL (ref <5)

## 2021-04-27 LAB — COMPREHENSIVE METABOLIC PANEL
ALT: 9 U/L (ref 0–44)
AST: 13 U/L — ABNORMAL LOW (ref 15–41)
Albumin: 4.3 g/dL (ref 3.5–5.0)
Alkaline Phosphatase: 83 U/L (ref 38–126)
Anion gap: 11 (ref 5–15)
BUN: 10 mg/dL (ref 6–20)
CO2: 21 mmol/L — ABNORMAL LOW (ref 22–32)
Calcium: 9.5 mg/dL (ref 8.9–10.3)
Chloride: 105 mmol/L (ref 98–111)
Creatinine, Ser: 0.91 mg/dL (ref 0.44–1.00)
GFR, Estimated: >60 mL/min (ref >60)
Glucose, Bld: 127 mg/dL — ABNORMAL HIGH (ref 70–99)
Potassium: 2.9 mmol/L — ABNORMAL LOW (ref 3.5–5.1)
Sodium: 137 mmol/L (ref 135–145)
Total Bilirubin: 1 mg/dL (ref 0.3–1.2)
Total Protein: 7.2 g/dL (ref 6.5–8.1)

## 2021-04-27 LAB — CBC WITH DIFFERENTIAL/PLATELET
Abs Immature Granulocytes: 0.02 10*3/uL (ref 0.00–0.07)
Basophils Absolute: 0.1 10*3/uL (ref 0.0–0.1)
Basophils Relative: 1 %
Eosinophils Absolute: 0 10*3/uL (ref 0.0–0.5)
Eosinophils Relative: 0 %
HCT: 46.7 % — ABNORMAL HIGH (ref 36.0–46.0)
Hemoglobin: 16 g/dL — ABNORMAL HIGH (ref 12.0–15.0)
Immature Granulocytes: 0 %
Lymphocytes Relative: 23 %
Lymphs Abs: 1.9 10*3/uL (ref 0.7–4.0)
MCH: 32.1 pg (ref 26.0–34.0)
MCHC: 34.3 g/dL (ref 30.0–36.0)
MCV: 93.8 fL (ref 80.0–100.0)
Monocytes Absolute: 0.5 10*3/uL (ref 0.1–1.0)
Monocytes Relative: 6 %
Neutro Abs: 6.1 10*3/uL (ref 1.7–7.7)
Neutrophils Relative %: 70 %
Platelets: 249 10*3/uL (ref 150–400)
RBC: 4.98 MIL/uL (ref 3.87–5.11)
RDW: 12.9 % (ref 11.5–15.5)
WBC: 8.6 10*3/uL (ref 4.0–10.5)
nRBC: 0 % (ref 0.0–0.2)

## 2021-04-27 LAB — RAPID URINE DRUG SCREEN, HOSP PERFORMED
Amphetamines: NOT DETECTED
Barbiturates: NOT DETECTED
Benzodiazepines: POSITIVE — AB
Cocaine: POSITIVE — AB
Opiates: NOT DETECTED
Tetrahydrocannabinol: POSITIVE — AB

## 2021-04-27 LAB — LIPASE, BLOOD: Lipase: 25 U/L (ref 11–51)

## 2021-04-27 MED ORDER — MAGNESIUM SULFATE 2 GM/50ML IV SOLN
2.0000 g | Freq: Once | INTRAVENOUS | Status: AC
  Administered 2021-04-27: 2 g via INTRAVENOUS
  Filled 2021-04-27: qty 50

## 2021-04-27 MED ORDER — CAPSAICIN 0.075 % EX CREA
TOPICAL_CREAM | Freq: Once | CUTANEOUS | Status: AC
  Filled 2021-04-27: qty 60

## 2021-04-27 MED ORDER — ONDANSETRON 4 MG PO TBDP
4.0000 mg | ORAL_TABLET | Freq: Once | ORAL | Status: DC
  Filled 2021-04-27: qty 1

## 2021-04-27 MED ORDER — METOCLOPRAMIDE HCL 5 MG/ML IJ SOLN
10.0000 mg | Freq: Once | INTRAMUSCULAR | Status: AC
  Administered 2021-04-27: 10 mg via INTRAVENOUS
  Filled 2021-04-27: qty 2

## 2021-04-27 MED ORDER — POTASSIUM CHLORIDE 20 MEQ PO PACK
40.0000 meq | PACK | Freq: Two times a day (BID) | ORAL | Status: DC
  Administered 2021-04-27: 40 meq via ORAL
  Filled 2021-04-27: qty 2

## 2021-04-27 MED ORDER — POTASSIUM CHLORIDE 10 MEQ/100ML IV SOLN
10.0000 meq | INTRAVENOUS | Status: AC
Start: 2021-04-27 — End: 2021-04-27
  Administered 2021-04-27 (×2): 10 meq via INTRAVENOUS
  Filled 2021-04-27 (×2): qty 100

## 2021-04-27 MED ORDER — ONDANSETRON HCL 4 MG/2ML IJ SOLN
4.0000 mg | Freq: Once | INTRAMUSCULAR | Status: AC
  Administered 2021-04-27: 4 mg via INTRAVENOUS
  Filled 2021-04-27: qty 2

## 2021-04-27 MED ORDER — DROPERIDOL 2.5 MG/ML IJ SOLN
1.2500 mg | Freq: Once | INTRAMUSCULAR | Status: AC
  Administered 2021-04-27: 1.25 mg via INTRAVENOUS
  Filled 2021-04-27: qty 2

## 2021-04-27 MED ORDER — LACTATED RINGERS IV BOLUS
1000.0000 mL | Freq: Once | INTRAVENOUS | Status: AC
  Administered 2021-04-27: 1000 mL via INTRAVENOUS

## 2021-04-27 MED ORDER — DIPHENHYDRAMINE HCL 50 MG/ML IJ SOLN
25.0000 mg | Freq: Once | INTRAMUSCULAR | Status: AC
  Administered 2021-04-27: 25 mg via INTRAVENOUS
  Filled 2021-04-27: qty 1

## 2021-04-27 NOTE — ED Notes (Signed)
Pt ambulated to bathroom well. Gave graham crackers and sprite for PO challenge. Will reassess patient.

## 2021-04-27 NOTE — ED Provider Notes (Signed)
[facility] EMERGENCY DEPARTMENT Provider Note   CSN: [id number] Arrival date & time: 04/27/21  0117     History Chief Complaint  Patient presents with   Emesis    [name] is a 35 y.o. female.   Emesis Associated symptoms: abdominal pain   Associated symptoms: no arthralgias, no chills, no cough, no fever, no myalgias and no sore throat   Patient presents for right-sided abdominal pain and vomiting.  Previously seen at urgent care who sent to the ED for further evaluation.  Patient has had similar symptoms over the past week.  She was seen in the ED 2 days ago.  At that time, she underwent laboratory work-up and CT scan of abdomen and pelvis.  CT scan showed no acute intra-abdominal inflammatory process.  There was an incidental finding of a right femoral head AVN that is new since 2020.  She was treated with IVF, fentanyl, and Phenergan during her most recent previous visit.  She had resolution of symptoms and was discharged with prescription for Phenergan.  She had recurrence of symptoms.  She states that her home antiemetics have not resolved her nausea and vomiting.  For this reason, she presents to the ED today.  She denies any change in the characteristics of her symptoms since her most recent ED visit.  She does endorse marijuana use but states that she has not smoked any in the past 2 days.    Past Medical History:  Diagnosis Date   Anxiety    Asthma    Bipolar 1 disorder (HCC)    Cataract    left eye   Depression    Endometriosis    GERD (gastroesophageal reflux disease)    Mental disorder    Migraine     Patient Active Problem List   Diagnosis Date Noted   Hiatal hernia 10/10/2019   Bipolar 1 disorder, depressed, severe (HCC) 02/22/2019   Severe recurrent major depression with psychotic features (HCC) 07/23/2017   Substance use disorder 07/23/2017   Pelvic pain in female 01/15/2015   MDD (major depressive disorder), recurrent episode,  severe (HCC) 03/11/2014   MDD (major depressive disorder) 03/11/2014   Bipolar disorder with depression (HCC) 09/14/2013   Major depressive disorder, recurrent episode, severe (HCC) 12/30/2011    Class: Stage 3   Bipolar affective disorder, depressed, severe (HCC) 12/30/2011    Class: Acute    Past Surgical History:  Procedure Laterality Date   ABDOMINAL SURGERY     "For endometriosis"   CHOLECYSTECTOMY     OOPHORECTOMY Right 2012   TUBAL LIGATION       OB History     Gravida  4   Para  2   Term  2   Preterm  0   AB  2   Living  3      SAB  2   IAB  0   Ectopic  0   Multiple  0   Live Births  1           Family History  Problem Relation Age of Onset   Stroke Mother    Hyperlipidemia Other    Colon cancer Neg Hx    Stomach cancer Neg Hx    Pancreatic cancer Neg Hx    Esophageal cancer Neg Hx     Social History   Tobacco Use   Smoking status: Every Day    Packs/day: 0.25    Years: 5.00    Pack years:  1.25    Types: Cigarettes   Smokeless tobacco: Never  Vaping Use   Vaping Use: Never used  Substance Use Topics   Alcohol use: Never   Drug use: Yes    Types: Marijuana, Barbituates    Comment: used weed 12 Jul 2019    Home Medications Prior to Admission medications   Medication Sig Start Date End Date Taking? Authorizing Provider  brexpiprazole (REXULTI) 2 MG TABS tablet Take by mouth.    [provider]  clonazePAM (KLONOPIN) 1 MG tablet Take 1 mg by mouth 3 (three) times daily as needed for anxiety.  05/21/19   [provider]  Erenumab-aooe (AIMOVIG) 140 MG/ML SOAJ Inject 140 mg into the skin every 30 (thirty) days. Appointment needed for further refills. 01/28/21   Anson Fret, MD  ondansetron (ZOFRAN-ODT) 8 MG disintegrating tablet Take 8 mg by mouth every 8 (eight) hours as needed for nausea or vomiting.    [provider]  pantoprazole (PROTONIX) 40 MG tablet Take 1 tablet (40 mg total) by mouth daily.  Patient needs office visit for further refills 01/28/21   Tressia Danas, MD  promethazine (PHENERGAN) 25 MG suppository Place 1 suppository (25 mg total) rectally every 6 (six) hours as needed for nausea or vomiting. 04/25/21   Mesner, Barbara Cower, MD  promethazine (PHENERGAN) 25 MG tablet Take 1 tablet (25 mg total) by mouth every 6 (six) hours as needed for nausea or vomiting. 04/25/21   Mesner, Barbara Cower, MD  Rimegepant Sulfate (NURTEC) 75 MG TBDP Take 75 mg by mouth daily as needed. For migraines. Take as close to onset of migraine as possible. One daily maximum. 04/16/20   Lomax, Amy, NP  SPRINTEC 28 0.25-35 MG-MCG tablet Take 1 tablet by mouth daily. 03/26/19   [provider]  topiramate (TOPAMAX) 100 MG tablet Take 100 mg by mouth 2 (two) times daily.    [provider]  valACYclovir (VALTREX) 500 MG tablet Take 500 mg by mouth daily.     [provider]  Vitamin D, Ergocalciferol, (DRISDOL) 1.25 MG (50000 UNIT) CAPS capsule Take 50,000 Units by mouth every 7 (seven) days.    [provider]    Allergies    Bee venom, Coconut flavor, Ibuprofen, Naproxen, and Tramadol  Review of Systems   Review of Systems  Constitutional:  Negative for chills, diaphoresis, fatigue and fever.  HENT:  Negative for ear pain and sore throat.   Eyes:  Negative for pain and visual disturbance.  Respiratory:  Negative for cough and shortness of breath.   Cardiovascular:  Negative for chest pain and palpitations.  Gastrointestinal:  Positive for abdominal pain, nausea and vomiting. Negative for abdominal distention.  Genitourinary:  Negative for dysuria, flank pain, hematuria, pelvic pain, vaginal bleeding and vaginal discharge.  Musculoskeletal:  Negative for arthralgias, back pain, myalgias and neck pain.  Skin:  Negative for color change and rash.  Neurological:  Negative for dizziness, seizures, syncope, weakness, light-headedness and numbness.  All other systems reviewed and are  negative.  Physical Exam Updated Vital Signs BP 115/81   Pulse 86   Temp 98.5 F (36.9 C)   Resp 14   SpO2 100%   Physical Exam Vitals and nursing note reviewed.  Constitutional:      General: She is not in acute distress.    Appearance: Normal appearance. She is well-developed and normal weight. She is not toxic-appearing or diaphoretic.  HENT:     Head: Normocephalic and atraumatic.  Right Ear: External ear normal.     Left Ear: External ear normal.     Nose: Nose normal.     Mouth/Throat:     Mouth: Mucous membranes are moist.     Pharynx: Oropharynx is clear.  Eyes:     Conjunctiva/sclera: Conjunctivae normal.  Cardiovascular:     Rate and Rhythm: Normal rate and regular rhythm.     Heart sounds: No murmur heard. Pulmonary:     Effort: Pulmonary effort is normal. No respiratory distress.     Breath sounds: Normal breath sounds.  Abdominal:     Palpations: Abdomen is soft.     Tenderness: There is abdominal tenderness. There is no right CVA tenderness, left CVA tenderness, guarding or rebound.  Musculoskeletal:     Cervical back: Neck supple.     Right lower leg: No edema.     Left lower leg: No edema.  Skin:    General: Skin is warm and dry.  Neurological:     General: No focal deficit present.     Mental Status: She is alert and oriented to person, place, and time.     Cranial Nerves: No cranial nerve deficit.     Sensory: No sensory deficit.     Motor: No weakness.  Psychiatric:        Mood and Affect: Mood normal.        Behavior: Behavior normal.        Thought Content: Thought content normal.        Judgment: Judgment normal.    ED Results / Procedures / Treatments   Labs (all labs ordered are listed, but only abnormal results are displayed) Labs Reviewed  CBC WITH DIFFERENTIAL/PLATELET - Abnormal; Notable for the following components:      Result Value   Hemoglobin 16.0 (*)    HCT 46.7 (*)    All other components within normal limits   COMPREHENSIVE METABOLIC PANEL - Abnormal; Notable for the following components:   Potassium 2.9 (*)    CO2 21 (*)    Glucose, Bld 127 (*)    AST 13 (*)    All other components within normal limits  URINALYSIS, ROUTINE W REFLEX MICROSCOPIC - Abnormal; Notable for the following components:   Color, Urine AMBER (*)    APPearance CLOUDY (*)    Ketones, ur 5 (*)    All other components within normal limits  RAPID URINE DRUG SCREEN, HOSP PERFORMED - Abnormal; Notable for the following components:   Cocaine POSITIVE (*)    Benzodiazepines POSITIVE (*)    Tetrahydrocannabinol POSITIVE (*)    All other components within normal limits  LIPASE, BLOOD  I-STAT BETA HCG BLOOD, ED ([facility], [facility], [facility] ONLY)    EKG None  Radiology No results found.  Procedures Procedures   Medications Ordered in ED Medications  potassium chloride (KLOR-CON) packet 40 mEq (40 mEq Oral Given 04/27/21 1355)  lactated ringers bolus 1,000 mL (0 mLs Intravenous Stopped 04/27/21 1608)  metoCLOPramide (REGLAN) injection 10 mg (10 mg Intravenous Given 04/27/21 1354)  diphenhydrAMINE (BENADRYL) injection 25 mg (25 mg Intravenous Given 04/27/21 1354)  potassium chloride 10 mEq in 100 mL IVPB (0 mEq Intravenous Stopped 04/27/21 1607)  magnesium sulfate IVPB 2 g 50 mL (0 g Intravenous Stopped 04/27/21 1440)  capsicum (ZOSTRIX) 0.075 % cream ( Topical Given 04/27/21 1613)  ondansetron (ZOFRAN) injection 4 mg (4 mg Intravenous Given 04/27/21 1613)  droperidol (INAPSINE) 2.5 MG/ML injection 1.25 mg (1.25 mg Intravenous Given  04/27/21 1722)    ED Course  I have reviewed the triage vital signs and the nursing notes.  Pertinent labs & imaging results that were available during my care of the patient were reviewed by me and considered in my medical decision making (see chart for details).  Clinical Course as of 04/27/21 2016  Mon Apr 27, 2021  1612 N/v, suspected hyperemsis, pending PO challenge [MK]    Clinical Course User  Index [MK] Kommor, Wyn ForsterMadison, MD   MDM Rules/Calculators/A&P                           Presents for right-sided abdominal pain, nausea, and vomiting.  Previously seen in urgent care and sent to the ED for further evaluation.  Prior to being bedded in the ED, patient was able to undergo lab work-up.  Labs show drug screen positive for cocaine, benzos, and THC.  Patient does not have leukocytosis.  Hemoglobin is elevated consistent with hemoconcentration.  Ketones present in urine consistent with dehydration and decreased p.o. intake.  Potassium is low at 2.9.  On initial assessment, patient resting in right lateral decubitus position.  She is not actively vomiting but does endorse continued nausea.  She endorses abdominal pain that is greater on the right side.  This area also tender to palpation.  Patient underwent thorough work-up in the ED 2 days ago for the same symptoms.  At that time, she had a CT scan that was negative for any acute intra-abdominal process to explain her symptoms.  Given that she has not had any change in the character of her symptoms, I do not feel that she needs repeat imaging today.  Nausea given for symptomatic relief.  IV fluids given for replacement given p.o. intolerance and vomiting.  Replacement potassium and magnesium ordered.  On reassessment, patient reported improved symptoms.  She was encouraged to try p.o. intake, including her oral potassium replacement.  Patient was counseled on possible cannabinoid hyperemesis as the etiology of her symptoms, given her previous work-ups with no other clear etiology.  While in the ED, she did report recurrence of nausea.  Zofran was given.  Care of patient was signed out to oncoming ED provider.  Final Clinical Impression(s) / ED Diagnoses Final diagnoses:  Cyclical vomiting syndrome not associated with migraine    Rx / DC Orders ED Discharge Orders     None        Gloris Manchesterixon, Jaikob Borgwardt, MD 04/27/21 2016

## 2021-04-27 NOTE — ED Notes (Signed)
Wiped off capsaicin per pt request, cool w/c applied.

## 2021-04-27 NOTE — ED Notes (Signed)
Alert, NAD, calm, interactive.  

## 2021-04-27 NOTE — ED Provider Notes (Addendum)
Emergency Medicine Provider Triage Evaluation Note  [name] , a 35 y.o. female  was evaluated in triage.  Pt complains of vomiting.  Seen here for same 04/25/21 with labs and CT, no clear cause identified.  Has been taking nausea meds at home without relief.  Unable to hold anything down for more than a few minutes.  Denies sick contacts, change in diet, travel.  Review of Systems  Positive: Nausea, vomiting Negative: Diarrhea, fever  Physical Exam  BP (!) 141/100 (BP Location: Right Arm)   Pulse 83   Temp 99.1 F (37.3 C) (Oral)   Resp 16   SpO2 100%  Gen:   Awake, no distress   Resp:  Normal effort  MSK:   Moves extremities without difficulty  Other:  Vomiting into trash can during triage  Medical Decision Making  Medically screening exam initiated at 1:32 AM.  Appropriate orders placed.  [name] was informed that the remainder of the evaluation will be completed by another provider, this initial triage assessment does not replace that evaluation, and the importance of remaining in the ED until their evaluation is complete.  Recurrent vomiting.  Reassuring work up with labs and CT from 04/25/21.  Labs sent.  Given zofran in triage.  2:56 AM Attempted to give zofran in triage, patient refused.   Garlon Hatchet, PA-C 04/27/21 0134    Garlon Hatchet, PA-C 04/27/21 0256    Nira Conn, MD 04/27/21 0600

## 2021-04-27 NOTE — ED Triage Notes (Signed)
Pt reports she continues to be unable to keep any food or water down.

## 2021-04-27 NOTE — ED Notes (Signed)
Offered pt Zofran for nausea, pt refused.

## 2021-04-27 NOTE — ED Notes (Addendum)
Alert, NAD, calm, interactive, resting/ reclined. nausea remains. She wanted the capsaicin wiped off. No emesis.

## 2021-04-28 NOTE — ED Provider Notes (Signed)
  Physical Exam  BP 115/81   Pulse 86   Temp 98.5 F (36.9 C)   Resp 14   SpO2 100%   Physical Exam Vitals and nursing note reviewed.  Constitutional:      General: She is not in acute distress.    Appearance: She is well-developed.  HENT:     Head: Normocephalic and atraumatic.  Eyes:     Conjunctiva/sclera: Conjunctivae normal.  Cardiovascular:     Rate and Rhythm: Normal rate and regular rhythm.     Heart sounds: No murmur heard. Pulmonary:     Effort: Pulmonary effort is normal. No respiratory distress.     Breath sounds: Normal breath sounds.  Abdominal:     Palpations: Abdomen is soft.     Tenderness: There is no abdominal tenderness.  Musculoskeletal:     Cervical back: Neck supple.  Skin:    General: Skin is warm and dry.  Neurological:     Mental Status: She is alert.    ED Course/Procedures   Clinical Course as of 04/28/21 0018  Mon Apr 27, 2021  1612 N/v, suspected hyperemsis, pending PO challenge [MK]    Clinical Course User Index [MK] Kealan Buchan, Wyn Forster, MD    Procedures  MDM  Patient received an handoff.  Patient with initial complaints of nausea vomiting concerning for cyclic vomiting.  Patient was given multiple medications prior to my evaluation, patient had persistent nausea on reevaluation.  She was given droperidol which led to resolution of her symptoms.  Patient was able to tolerate p.o. without difficulty here in the emergency department and ambulated without difficulty.  Patient then discharged.       Glendora Score, MD 04/28/21 [id number]

## 2021-05-05 ENCOUNTER — Encounter (HOSPITAL_COMMUNITY): Payer: Self-pay

## 2021-05-05 ENCOUNTER — Emergency Department (HOSPITAL_COMMUNITY): Payer: Medicaid Other

## 2021-05-05 ENCOUNTER — Other Ambulatory Visit: Payer: Self-pay

## 2021-05-05 ENCOUNTER — Emergency Department (HOSPITAL_COMMUNITY)
Admission: EM | Admit: 2021-05-05 | Discharge: 2021-05-05 | Disposition: A | Discharge status: Home / Self Care | Payer: Medicaid Other | Attending: Emergency Medicine | Admitting: Emergency Medicine

## 2021-05-05 DIAGNOSIS — R112 Nausea with vomiting, unspecified: Secondary | ICD-10-CM | POA: Diagnosis not present

## 2021-05-05 DIAGNOSIS — R42 Dizziness and giddiness: Secondary | ICD-10-CM | POA: Diagnosis not present

## 2021-05-05 DIAGNOSIS — R1013 Epigastric pain: Secondary | ICD-10-CM | POA: Diagnosis present

## 2021-05-05 DIAGNOSIS — R5383 Other fatigue: Secondary | ICD-10-CM | POA: Diagnosis not present

## 2021-05-05 DIAGNOSIS — R1011 Right upper quadrant pain: Secondary | ICD-10-CM | POA: Diagnosis not present

## 2021-05-05 DIAGNOSIS — K219 Gastro-esophageal reflux disease without esophagitis: Secondary | ICD-10-CM | POA: Insufficient documentation

## 2021-05-05 DIAGNOSIS — F17210 Nicotine dependence, cigarettes, uncomplicated: Secondary | ICD-10-CM | POA: Insufficient documentation

## 2021-05-05 DIAGNOSIS — J45909 Unspecified asthma, uncomplicated: Secondary | ICD-10-CM | POA: Diagnosis not present

## 2021-05-05 DIAGNOSIS — E876 Hypokalemia: Secondary | ICD-10-CM | POA: Diagnosis not present

## 2021-05-05 DIAGNOSIS — N9489 Other specified conditions associated with female genital organs and menstrual cycle: Secondary | ICD-10-CM | POA: Insufficient documentation

## 2021-05-05 LAB — CBC WITH DIFFERENTIAL/PLATELET
Abs Immature Granulocytes: 0.03 10*3/uL (ref 0.00–0.07)
Basophils Absolute: 0.1 10*3/uL (ref 0.0–0.1)
Basophils Relative: 1 %
Eosinophils Absolute: 0.1 10*3/uL (ref 0.0–0.5)
Eosinophils Relative: 1 %
HCT: 46.8 % — ABNORMAL HIGH (ref 36.0–46.0)
Hemoglobin: 16.6 g/dL — ABNORMAL HIGH (ref 12.0–15.0)
Immature Granulocytes: 0 %
Lymphocytes Relative: 28 %
Lymphs Abs: 2.2 10*3/uL (ref 0.7–4.0)
MCH: 33 pg (ref 26.0–34.0)
MCHC: 35.5 g/dL (ref 30.0–36.0)
MCV: 93 fL (ref 80.0–100.0)
Monocytes Absolute: 0.7 10*3/uL (ref 0.1–1.0)
Monocytes Relative: 9 %
Neutro Abs: 4.8 10*3/uL (ref 1.7–7.7)
Neutrophils Relative %: 61 %
Platelets: 221 10*3/uL (ref 150–400)
RBC: 5.03 MIL/uL (ref 3.87–5.11)
RDW: 13.1 % (ref 11.5–15.5)
WBC: 7.9 10*3/uL (ref 4.0–10.5)
nRBC: 0 % (ref 0.0–0.2)

## 2021-05-05 LAB — COMPREHENSIVE METABOLIC PANEL
ALT: 14 U/L (ref 0–44)
AST: 16 U/L (ref 15–41)
Albumin: 4.8 g/dL (ref 3.5–5.0)
Alkaline Phosphatase: 88 U/L (ref 38–126)
Anion gap: 11 (ref 5–15)
BUN: 11 mg/dL (ref 6–20)
CO2: 19 mmol/L — ABNORMAL LOW (ref 22–32)
Calcium: 9.8 mg/dL (ref 8.9–10.3)
Chloride: 109 mmol/L (ref 98–111)
Creatinine, Ser: 0.81 mg/dL (ref 0.44–1.00)
GFR, Estimated: >60 mL/min (ref >60)
Glucose, Bld: 100 mg/dL — ABNORMAL HIGH (ref 70–99)
Potassium: 3.1 mmol/L — ABNORMAL LOW (ref 3.5–5.1)
Sodium: 139 mmol/L (ref 135–145)
Total Bilirubin: 0.8 mg/dL (ref 0.3–1.2)
Total Protein: 8 g/dL (ref 6.5–8.1)

## 2021-05-05 LAB — I-STAT BETA HCG BLOOD, ED (MC, WL, AP ONLY): I-stat hCG, quantitative: <5 m[IU]/mL (ref <5)

## 2021-05-05 LAB — LIPASE, BLOOD: Lipase: 24 U/L (ref 11–51)

## 2021-05-05 MED ORDER — PANTOPRAZOLE SODIUM 40 MG IV SOLR
40.0000 mg | Freq: Once | INTRAVENOUS | Status: AC
  Administered 2021-05-05: 40 mg via INTRAVENOUS
  Filled 2021-05-05: qty 40

## 2021-05-05 MED ORDER — FENTANYL CITRATE PF 50 MCG/ML IJ SOSY
50.0000 ug | PREFILLED_SYRINGE | Freq: Once | INTRAMUSCULAR | Status: AC
Start: 2021-05-05 — End: 2021-05-05
  Administered 2021-05-05: 50 ug via INTRAVENOUS
  Filled 2021-05-05: qty 1

## 2021-05-05 MED ORDER — METOCLOPRAMIDE HCL 5 MG/ML IJ SOLN
10.0000 mg | Freq: Once | INTRAMUSCULAR | Status: AC
  Administered 2021-05-05: 10 mg via INTRAVENOUS
  Filled 2021-05-05: qty 2

## 2021-05-05 MED ORDER — POTASSIUM CHLORIDE CRYS ER 20 MEQ PO TBCR
20.0000 meq | EXTENDED_RELEASE_TABLET | Freq: Once | ORAL | Status: AC
  Administered 2021-05-05: 20 meq via ORAL
  Filled 2021-05-05: qty 1

## 2021-05-05 MED ORDER — ONDANSETRON HCL 4 MG/2ML IJ SOLN: 4.0000 mg | Freq: Once | INTRAMUSCULAR | Status: DC

## 2021-05-05 MED ORDER — SODIUM CHLORIDE 0.9 % IV BOLUS
1000.0000 mL | Freq: Once | INTRAVENOUS | Status: AC
  Administered 2021-05-05: 1000 mL via INTRAVENOUS

## 2021-05-05 NOTE — Discharge Instructions (Addendum)
You were seen in the emergency department for abdominal pain nausea and vomiting.  Your lab work showed that your potassium was low and you were dehydrated.  You had a right upper quadrant ultrasound that did not show any significant findings.  We put in a referral for you to follow-up with gastroenterology.  Please consider stopping marijuana use as this may be making your symptoms worse.  Follow-up with your primary care doctor.

## 2021-05-05 NOTE — ED Notes (Addendum)
Pt denies substance use recently and states that she is prescribed benzodiazepines

## 2021-05-05 NOTE — ED Provider Notes (Signed)
Emergency Medicine Provider Triage Evaluation Note  [name] , a 35 y.o. female  was evaluated in triage.  Pt complains of abdominal pain for the past 3 weeks.  Reports associated nausea, vomiting, as well as diarrhea.  Also complains of chills as well as lightheadedness.  Physical Exam  BP (!) 126/97 (BP Location: Right Arm)   Pulse (!) 108   Temp 98.8 F (37.1 C) (Oral)   Resp 16   Ht 5\' 2"  (1.575 m)   Wt 68 kg   SpO2 100%   BMI 27.44 kg/m  Gen:   Awake, no distress   Resp:  Normal effort  MSK:   Moves extremities without difficulty  Other:  Mild to moderate tenderness along the right upper quadrant and epigastrium.  Medical Decision Making  Medically screening exam initiated at 6:31 PM.  Appropriate orders placed.  [name] was informed that the remainder of the evaluation will be completed by another provider, this initial triage assessment does not replace that evaluation, and the importance of remaining in the ED until their evaluation is complete.   , PA-C 05/05/21 1832    07/05/21, MD 05/06/21 1019

## 2021-05-05 NOTE — ED Triage Notes (Addendum)
Patient c/o RUQ abdominal pain, N/V/D and states that she has not urinated since yesterday. Patient also c/o dizziness and feels like she is going to pass out.

## 2021-05-06 NOTE — ED Provider Notes (Signed)
[facility]-EMERGENCY DEPT Provider Note   CSN: [id number] Arrival date & time: 05/05/21  1747     History Chief Complaint  Patient presents with   Abdominal Pain   Emesis   Urinary Retention   Dizziness    [name] is a 35 y.o. female.  She is complaining of 3 weeks of upper abdominal pain nausea and vomiting.  Has been seen in the ED twice for similar presentation.  At [place].  No fevers.  States unable to take p.o. due to symptoms and home no urination today.  Feeling dizzy lightheaded.  The history is provided by the patient.  Abdominal Pain Pain location:  Epigastric Pain quality: burning   Pain radiates to:  Does not radiate Pain severity:  Moderate Onset quality:  Gradual Duration:  3 weeks Timing:  Constant Progression:  Unchanged Chronicity:  New Context: not trauma   Relieved by:  Nothing Worsened by:  Nothing Ineffective treatments:  None tried Associated symptoms: fatigue, nausea and vomiting   Associated symptoms: no chest pain, no constipation, no dysuria, no fever, no hematemesis, no shortness of breath, no sore throat and no vaginal bleeding   Emesis Associated symptoms: abdominal pain   Associated symptoms: no fever and no sore throat   Dizziness Associated symptoms: nausea and vomiting   Associated symptoms: no chest pain and no shortness of breath       Past Medical History:  Diagnosis Date   Anxiety    Asthma    Bipolar 1 disorder (HCC)    Cataract    left eye   Depression    Endometriosis    GERD (gastroesophageal reflux disease)    Mental disorder    Migraine     Patient Active Problem List   Diagnosis Date Noted   Hiatal hernia 10/10/2019   Bipolar 1 disorder, depressed, severe (HCC) 02/22/2019   Severe recurrent major depression with psychotic features (HCC) 07/23/2017   Substance use disorder 07/23/2017   Pelvic pain in female 01/15/2015   MDD (major depressive disorder), recurrent episode,  severe (HCC) 03/11/2014   MDD (major depressive disorder) 03/11/2014   Bipolar disorder with depression (HCC) 09/14/2013   Major depressive disorder, recurrent episode, severe (HCC) 12/30/2011    Class: Stage 3   Bipolar affective disorder, depressed, severe (HCC) 12/30/2011    Class: Acute    Past Surgical History:  Procedure Laterality Date   ABDOMINAL SURGERY     "For endometriosis"   CHOLECYSTECTOMY     OOPHORECTOMY Right 2012   TUBAL LIGATION       OB History     Gravida  4   Para  2   Term  2   Preterm  0   AB  2   Living  3      SAB  2   IAB  0   Ectopic  0   Multiple  0   Live Births  1           Family History  Problem Relation Age of Onset   Stroke Mother    Hyperlipidemia Other    Colon cancer Neg Hx    Stomach cancer Neg Hx    Pancreatic cancer Neg Hx    Esophageal cancer Neg Hx     Social History   Tobacco Use   Smoking status: Every Day    Packs/day: 0.25    Years: 5.00    Pack years: 1.25    Types: Cigarettes  Smokeless tobacco: Never  Vaping Use   Vaping Use: Every day   Substances: Nicotine, Flavoring  Substance Use Topics   Alcohol use: Never   Drug use: Yes    Types: Marijuana    Comment: daily use    Home Medications Prior to Admission medications   Medication Sig Start Date End Date Taking? Authorizing Provider  brexpiprazole (REXULTI) 2 MG TABS tablet Take by mouth.    [provider]  clonazePAM (KLONOPIN) 1 MG tablet Take 1 mg by mouth 3 (three) times daily as needed for anxiety.  05/21/19   [provider]  Erenumab-aooe (AIMOVIG) 140 MG/ML SOAJ Inject 140 mg into the skin every 30 (thirty) days. Appointment needed for further refills. 01/28/21   Anson Fret, MD  ondansetron (ZOFRAN-ODT) 8 MG disintegrating tablet Take 8 mg by mouth every 8 (eight) hours as needed for nausea or vomiting.    [provider]  pantoprazole (PROTONIX) 40 MG tablet Take 1 tablet (40 mg total) by  mouth daily. Patient needs office visit for further refills 01/28/21   Tressia Danas, MD  promethazine (PHENERGAN) 25 MG suppository Place 1 suppository (25 mg total) rectally every 6 (six) hours as needed for nausea or vomiting. 04/25/21   Mesner, Barbara Cower, MD  promethazine (PHENERGAN) 25 MG tablet Take 1 tablet (25 mg total) by mouth every 6 (six) hours as needed for nausea or vomiting. 04/25/21   Mesner, Barbara Cower, MD  Rimegepant Sulfate (NURTEC) 75 MG TBDP Take 75 mg by mouth daily as needed. For migraines. Take as close to onset of migraine as possible. One daily maximum. 04/16/20   Lomax, Amy, NP  SPRINTEC 28 0.25-35 MG-MCG tablet Take 1 tablet by mouth daily. 03/26/19   [provider]  topiramate (TOPAMAX) 100 MG tablet Take 100 mg by mouth 2 (two) times daily.    [provider]  valACYclovir (VALTREX) 500 MG tablet Take 500 mg by mouth daily.     [provider]  Vitamin D, Ergocalciferol, (DRISDOL) 1.25 MG (50000 UNIT) CAPS capsule Take 50,000 Units by mouth every 7 (seven) days.    [provider]    Allergies    Bee venom, Coconut flavor, Ibuprofen, Naproxen, and Tramadol  Review of Systems   Review of Systems  Constitutional:  Positive for fatigue. Negative for fever.  HENT:  Negative for sore throat.   Respiratory:  Negative for shortness of breath.   Cardiovascular:  Negative for chest pain.  Gastrointestinal:  Positive for abdominal pain, nausea and vomiting. Negative for constipation and hematemesis.  Genitourinary:  Negative for dysuria and vaginal bleeding.  Musculoskeletal:  Negative for neck pain.  Skin:  Negative for rash.  Neurological:  Positive for dizziness.   Physical Exam Updated Vital Signs BP 108/81   Pulse 86   Temp 98.8 F (37.1 C) (Oral)   Resp 16   Ht 5\' 2"  (1.575 m)   Wt 68 kg   LMP 04/13/2021 (Approximate)   SpO2 100%   BMI 27.44 kg/m   Physical Exam Vitals and nursing note reviewed.  Constitutional:       General: She is not in acute distress.    Appearance: She is well-developed.  HENT:     Head: Normocephalic and atraumatic.  Eyes:     Conjunctiva/sclera: Conjunctivae normal.  Cardiovascular:     Rate and Rhythm: Normal rate and regular rhythm.     Heart sounds: No murmur heard. Pulmonary:     Effort: Pulmonary effort is  normal. No respiratory distress.     Breath sounds: Normal breath sounds.  Abdominal:     Palpations: Abdomen is soft.     Tenderness: There is no abdominal tenderness. There is no guarding or rebound.  Musculoskeletal:        General: No deformity or signs of injury. Normal range of motion.     Cervical back: Neck supple.  Skin:    General: Skin is warm and dry.  Neurological:     General: No focal deficit present.     Mental Status: She is alert.    ED Results / Procedures / Treatments   Labs (all labs ordered are listed, but only abnormal results are displayed) Labs Reviewed  COMPREHENSIVE METABOLIC PANEL - Abnormal; Notable for the following components:      Result Value   Potassium 3.1 (*)    CO2 19 (*)    Glucose, Bld 100 (*)    All other components within normal limits  CBC WITH DIFFERENTIAL/PLATELET - Abnormal; Notable for the following components:   Hemoglobin 16.6 (*)    HCT 46.8 (*)    All other components within normal limits  LIPASE, BLOOD  I-STAT BETA HCG BLOOD, ED ([facility], [facility], [facility] ONLY)    EKG None  Radiology US Abdomen Limited RUQ (LIVER/GB)  Result Date: 05/05/2021 CLINICAL DATA:  Right upper quadrant pain EXAM: ULTRASOUND ABDOMEN LIMITED RIGHT UPPER QUADRANT COMPARISON:  None. FINDINGS: Gallbladder: Prior cholecystectomy Common bile duct: Diameter: Normal caliber, 2 mm Liver: No focal lesion identified. Within normal limits in parenchymal echogenicity. Portal vein is patent on color Doppler imaging with normal direction of blood flow towards the liver. Other: None. IMPRESSION: No acute findings. Prior cholecystectomy. Electronically  Signed   By: Charlett NoseKevin  Dover M.D.   On: 05/05/2021 19:37    Procedures Procedures   Medications Ordered in ED Medications  sodium chloride 0.9 % bolus 1,000 mL (0 mLs Intravenous Stopped 05/05/21 2338)  pantoprazole (PROTONIX) injection 40 mg (40 mg Intravenous Given 05/05/21 2132)  metoCLOPramide (REGLAN) injection 10 mg (10 mg Intravenous Given 05/05/21 2131)  fentaNYL (SUBLIMAZE) injection 50 mcg (50 mcg Intravenous Given 05/05/21 2135)  potassium chloride SA (KLOR-CON) CR tablet 20 mEq (20 mEq Oral Given 05/05/21 2342)    ED Course  I have reviewed the triage vital signs and the nursing notes.  Pertinent labs & imaging results that were available during my care of the patient were reviewed by me and considered in my medical decision making (see chart for details).  Clinical Course as of 05/06/21 1020  Tue May 05, 2021  2218 Patient had a CT 8/27 that did not show any acute intra-abdominal process. [MB]  2257 Patient states she is feeling little better.  Will p.o. challenge. [MB]    Clinical Course User Index [MB] Terrilee FilesButler, Loyola Santino C, MD   MDM Rules/Calculators/A&P                          This patient complains of abdominal pain nausea vomiting poor p.o. intake; this involves an extensive number of treatment Options and is a complaint that carries with it a high risk of complications and Morbidity. The differential includes dehydration, metabolic derangement, gastritis, peptic ulcer disease, cannabis hyperemesis disease, gastroparesis, cholelithiasis, cholecystitis  I ordered, reviewed and interpreted labs, which included CBC with normal white count, hemoglobin elevated possibly reflecting some dehydration, chemistries with low potassium low bicarb. I ordered medication IV fluids IV pain medication nausea  medication, oral potassium I ordered imaging studies which included right upper quadrant ultrasound and I independently    visualized and interpreted imaging which showed no acute  findings Previous records obtained and reviewed in epic including prior ED visits   After the interventions stated above, I reevaluated the patient and found patient be symptomatically improved.  Have placed referral in for GI for her.  Return instructions discussed   Final Clinical Impression(s) / ED Diagnoses Final diagnoses:  RUQ pain  Non-intractable vomiting with nausea, unspecified vomiting type  Hypokalemia    Rx / DC Orders ED Discharge Orders          Ordered    Ambulatory referral to Gastroenterology        05/05/21 2300             Terrilee Files, MD 05/06/21 1024

## 2021-06-05 ENCOUNTER — Ambulatory Visit (INDEPENDENT_AMBULATORY_CARE_PROVIDER_SITE_OTHER): Payer: Medicaid Other | Admitting: Gastroenterology

## 2021-06-05 ENCOUNTER — Encounter: Payer: Self-pay | Admitting: Gastroenterology

## 2021-06-05 ENCOUNTER — Telehealth: Payer: Self-pay | Admitting: Gastroenterology

## 2021-06-05 VITALS — BP 120/80 | HR 87 | Ht 62.0 in | Wt 152.4 lb

## 2021-06-05 DIAGNOSIS — K259 Gastric ulcer, unspecified as acute or chronic, without hemorrhage or perforation: Secondary | ICD-10-CM | POA: Diagnosis not present

## 2021-06-05 DIAGNOSIS — R1084 Generalized abdominal pain: Secondary | ICD-10-CM | POA: Diagnosis not present

## 2021-06-05 MED ORDER — SUCRALFATE 1 GM/10ML PO SUSP
1.0000 g | Freq: Four times a day (QID) | ORAL | 0 refills | Status: DC
Start: 2021-06-05 — End: 2021-07-16

## 2021-06-05 NOTE — Telephone Encounter (Signed)
Dr Orvan Falconer- See patient note... She is taking omeprazole 40. May I send script?

## 2021-06-05 NOTE — Progress Notes (Signed)
Referring Provider: [facility] Primary Care Physician:  [facility]  Chief complaint:  Abdominal pain   IMPRESSION:  Recurrent epigastric pain with associated nausea and vomiting    - not explained by recent ultrasound or CT Gastric ulcer and erosions 07/13/19    - biopsies negative for H pylori     - follow-up EGD to document healing was never performed Bloating    - duodenal biopsies negative for celiac disease Medium sized hiatal hernia Abnormal abdominal CT suggesting colitis versus under distended colon 01/2019    - findings not seen on CT 04/25/2021 Stool burden on abdominal x-rays 03/2019 Patient and family concerns for symptomatic hernia    PLAN: - Increase pantoprazole to 40 mg BID for at least 8 weeks - Carafate 1 gram slurry QID x 2 weeks - EGD - She will contact me with the name of her friend's ulcer medicine - Abstain from marijuana for 2 weeks to see if this ultimately improves her symptoms  Please see the "Patient Instructions" section for addition details about the plan.  HPI: [name] is a 35 y.o. female last seen in the office 08/14/2019 for epigastric abdominal pain with nausea and vomiting.  She presents today in ER follow-up.    At the time of her initial consultation in 2020 she reported the onset of abdominal problems following her cholecystectomy 9 years ago that was performed during pregnancy. Heartburn and epigastric burning developed 5 months after her cholecystectomy.  Symptoms were intermittent and occasionally associated with non-radiating RUQ cramping and aching. Localized to the area where her gallbladder used to be. Associated bloating, distension, nausea and vomiting. Symptoms are similar to those prior to her cholecystectomy.  No change in symptoms with eating, defecation, or movement. Spicy foods, pizza, spaghetti, and lasagna seem to trigger her symptoms.   She had been using sucralfate  QID and prilosec PRN without significant improvement. Ranitidine provided relief of her heartburn.  No benefit from Tums.  Ondansetron provided only temporary benefit.  She was started on pantoprazole in October. She cut back on smoking using a water vapor device and was able to eliminate caffeine and carbonated beverages.  She denies use of any NSAIDs.  A CT performed during an ER evaluation 02/17/2019 showed mild diffuse circumferential colonic wall thickening that could be due to under distention throughout the colon.  No source for her abdominal pain identified.  She was treated with Cipro, Flagyl, ondansetron, and oxycodone.  She was subsequently seen in the urgent care 04/12/2019 for constipation, nausea, and vomiting.  Stool studies including ova and parasites were negative.  EGD 07/13/19 showed a normal esophagus, a medium sized hiatal hernia, gastric erosions in the body and antrum, and a 54mm gastric antral ulcer. Gastric biopsies showed reactive gastropathy. There was no H pylori. Duodenal biopsies were negative.    At the time of her last office visit 08/14/2019 she noted no improvement with pantoprazole or FD guard.  She had previously used BuSpar for anxiety, did not find it helpful with the anxiety, and does not remember her GI symptoms being better tolerated.  Her grandmother accompanied her and was concerned that her symptoms were due to hernia requiring surgical repair.  She has been seen in the ER 3 times for recurrent upper abdominal pain, nausea, and vomiting, most recently 05/05/21. Also having dysphagia to solids particularly bread.   CT of the abdomen pelvis with contrast 04/25/2021 showed no acute intra-abdominal process.  Abdominal ultrasound 05/05/2021 showed evidence for prior cholecystectomy but was otherwise normal.  She uses marijuana some days to try to help with the symptoms.  Symptoms worsened by eating red meat and red sauce. Diet has been reduced to primarily chicken.   She  has been using a friend's "ulcer pills" that have been helping with the pain.   She is under significant stress related to custody of her son. She has to go to court next week to follow-up on a broken restraining order.  She is concerned that her hernia is back. Her grandmother remains concerned about a hernia, as well.    Prior abdominal imaging: - Intraoperative cholangiogram 04/17/2019: Normal - CT of the abdomen and pelvis with contrast 01/16/2015: Tiny umbilical hernia containing fat, post right oophorectomy, no acute abnormality - CT of the abdomen and pelvis with contrast 04/29/2015: Abnormality - CT renal stone study 05/03/2017: Small hiatal hernia, mild hydronephrosis otherwise normal - CT of the abdomen and pelvis with contrast 02/17/2019: Mild apparent diffuse circumferential colonic wall thickening potentially accentuated due to under distention otherwise no explanation for the patient's severe right upper quadrant abdominal pain, moderately sized hiatal hernia, prior cholecystectomy - Abdominal x-ray 04/12/2019: Moderate stool in the colon.  No bowel obstruction. - CT of the angio chest PE with or without contrast 05/29/2019: No PE, small airway disease/asthma, moderate hiatal hernia - CT of the abdomen pelvis with contrast 04/25/2021: no acute intra-abdominal process.   - Abdominal ultrasound 05/05/2021: evidence for prior cholecystectomy but was otherwise normal.  Past Medical History:  Diagnosis Date   Anxiety    Asthma    Bipolar 1 disorder (HCC)    Bipolar 1 disorder (HCC)    Cataract    left eye   Depression    Endometriosis    GERD (gastroesophageal reflux disease)    Mental disorder    Migraine     Past Surgical History:  Procedure Laterality Date   ABDOMINAL SURGERY     "For endometriosis"   CHOLECYSTECTOMY     HERNIA REPAIR     OOPHORECTOMY Right 08/30/2010   TUBAL LIGATION      Current Outpatient Medications  Medication Sig Dispense Refill   ASPIRIN ADULT PO  Take 350 mg by mouth daily.     cariprazine (VRAYLAR) 1.5 MG capsule Take 1.5 mg by mouth daily.     clonazePAM (KLONOPIN) 1 MG tablet Take 1 mg by mouth 3 (three) times daily as needed for anxiety.      fluticasone (FLOVENT HFA) 110 MCG/ACT inhaler Inhale into the lungs.     ondansetron (ZOFRAN-ODT) 8 MG disintegrating tablet Take 8 mg by mouth every 8 (eight) hours as needed for nausea or vomiting.     PROAIR HFA 108 (90 Base) MCG/ACT inhaler SMARTSIG:1-2 Puff(s) Via Inhaler PRN     sucralfate (CARAFATE) 1 GM/10ML suspension Take 10 mLs (1 g total) by mouth 4 (four) times daily. 1200 mL 0   topiramate (TOPAMAX) 100 MG tablet Take 100 mg by mouth 2 (two) times daily.     valACYclovir (VALTREX) 500 MG tablet Take 500 mg by mouth daily.     Vitamin D, Ergocalciferol, (DRISDOL) 1.25 MG (50000 UNIT) CAPS capsule Take 50,000 Units by mouth every 7 (seven) days. (Patient not taking: Reported on 06/10/2021)     omeprazole (PRILOSEC) 40 MG capsule Take 1 capsule (40 mg total) by mouth daily. 90 capsule 1   pantoprazole (PROTONIX) 40 MG tablet Take 1 tablet (40 mg total)  by mouth 2 (two) times daily. 180 tablet 4   rOPINIRole (REQUIP) 3 MG tablet SMARTSIG:1 Tablet(s) By Mouth Every Evening     No current facility-administered medications for this visit.   Facility-Administered Medications Ordered in Other Visits  Medication Dose Route Frequency Provider Last Rate Last Admin   sodium chloride flush (NS) 0.9 % injection 20 mL  20 mL Intravenous PRN Naomie Dean B, MD   20 mL at 02/11/20 1301    Allergies as of 06/05/2021 - Review Complete 06/05/2021  Allergen Reaction Noted   Bee venom Anaphylaxis and Swelling 01/15/2015   Coconut flavor Anaphylaxis 09/23/2014   Ibuprofen Hives 07/22/2017   Naproxen Nausea And Vomiting 05/03/2017   Tramadol Other (See Comments) 11/20/2019    Family History  Problem Relation Age of Onset   Stroke Mother    Hyperlipidemia Other    Colon cancer Neg Hx     Stomach cancer Neg Hx    Pancreatic cancer Neg Hx    Esophageal cancer Neg Hx        Physical Exam: General:   Alert,  well-nourished, pleasant and cooperative in NAD Head:  Normocephalic and atraumatic. Eyes:  Sclera clear, no icterus.   Conjunctiva pink. Abdomen:  Soft, nontender, nondistended, normal bowel sounds, no rebound or guarding. No hepatosplenomegaly.   Msk:  Symmetrical. No obvious boney deformities LAD: No inguinal or umbilical LAD Extremities:  No clubbing or edema. Neurologic:  Alert and  oriented x4;  grossly nonfocal Skin:  Intact without significant lesions or rashes. Psych:  Alert and cooperative. Normal mood and affect.    Manuelito Poage L. Orvan Falconer, MD, MPH 06/14/2021, 7:51 PM

## 2021-06-05 NOTE — Patient Instructions (Addendum)
You have been scheduled for an endoscopy. Please follow written instructions given to you at your visit today. If you use inhalers (even only as needed), please bring them with you on the day of your procedure. ____________________________ We have sent the following medications to your pharmacy for you to pick up at your convenience: Carafate 1 gram (10 ml) four times daily ____________________________ Please send [place] a mychart message letting us know which of your friends medications you have been taking for the stomach. ____________________________ If you are age 30 or older, your body mass index should be between 23-30. Your Body mass index is 27.87 kg/m. If this is out of the aforementioned range listed, please consider follow up with your Primary Care Provider.  If you are age 34 or younger, your body mass index should be between 19-25. Your Body mass index is 27.87 kg/m. If this is out of the aformentioned range listed, please consider follow up with your Primary Care Provider.   ________________________________________________________ The [place] [facility] providers would like to encourage you to use [facility] to communicate with providers for non-urgent requests or questions.  Due to long hold times on the telephone, sending your provider a message by [facility] may be a faster and more efficient way to get a response.  Please allow 48 business hours for a response.  Please remember that this is for non-urgent requests.   Due to recent changes in healthcare laws, you may see the results of your imaging and laboratory studies on MyChart before your provider has had a chance to review them.  We understand that in some cases there may be results that are confusing or concerning to you. Not all laboratory results come back in the same time frame and the provider may be waiting for multiple results in order to interpret others.  Please give [place] 48 hours in order for your provider to thoroughly review all the  results before contacting the office for clarification of your results.

## 2021-06-05 NOTE — Telephone Encounter (Signed)
Patient needs prescription called in for Omeprazole, 40 mg capsules, one daily. When here for appt today, she could not remember the name/dosage of the medication and was asked to call back with that information.  Thank you.

## 2021-06-08 MED ORDER — OMEPRAZOLE 40 MG PO CPDR: 40.0000 mg | DELAYED_RELEASE_CAPSULE | Freq: Every day | ORAL | 1 refills | Status: DC

## 2021-06-08 NOTE — Telephone Encounter (Signed)
Rx sent 

## 2021-06-10 ENCOUNTER — Encounter: Payer: Self-pay | Admitting: Gastroenterology

## 2021-06-10 ENCOUNTER — Ambulatory Visit (AMBULATORY_SURGERY_CENTER): Payer: Medicaid Other | Admitting: Gastroenterology

## 2021-06-10 ENCOUNTER — Other Ambulatory Visit: Payer: Self-pay

## 2021-06-10 VITALS — BP 115/74 | HR 65 | Temp 97.9°F | Resp 16 | Ht 62.0 in | Wt 152.0 lb

## 2021-06-10 DIAGNOSIS — K2100 Gastro-esophageal reflux disease with esophagitis, without bleeding: Secondary | ICD-10-CM

## 2021-06-10 DIAGNOSIS — K2970 Gastritis, unspecified, without bleeding: Secondary | ICD-10-CM

## 2021-06-10 DIAGNOSIS — R1084 Generalized abdominal pain: Secondary | ICD-10-CM

## 2021-06-10 DIAGNOSIS — K449 Diaphragmatic hernia without obstruction or gangrene: Secondary | ICD-10-CM | POA: Diagnosis not present

## 2021-06-10 MED ORDER — SODIUM CHLORIDE 0.9 % IV SOLN: 500.0000 mL | INTRAVENOUS | Status: DC

## 2021-06-10 MED ORDER — PANTOPRAZOLE SODIUM 40 MG PO TBEC: 40.0000 mg | DELAYED_RELEASE_TABLET | Freq: Two times a day (BID) | ORAL | 4 refills | Status: DC

## 2021-06-10 NOTE — Progress Notes (Signed)
Called to room to assist during endoscopic procedure.  Patient ID and intended procedure confirmed with present staff. Received instructions for my participation in the procedure from the performing physician.  

## 2021-06-10 NOTE — Patient Instructions (Signed)
Discharge instructions given. Handout on Hiatal Hernia. Continue current medications. No aspirin,ibuprofen,naproxen,or other non-steroidal anti-inflammatory drugs. Office follow up in 4 weeks earlier if needed office will schedule. YOU HAD AN ENDOSCOPIC PROCEDURE TODAY AT THE [facility]:   Refer to the procedure report that was given to you for any specific questions about what was found during the examination.  If the procedure report does not answer your questions, please call your gastroenterologist to clarify.  If you requested that your care partner not be given the details of your procedure findings, then the procedure report has been included in a sealed envelope for you to review at your convenience later.  YOU SHOULD EXPECT: Some feelings of bloating in the abdomen. Passage of more gas than usual.  Walking can help get rid of the air that was put into your GI tract during the procedure and reduce the bloating. If you had a lower endoscopy (such as a colonoscopy or flexible sigmoidoscopy) you may notice spotting of blood in your stool or on the toilet paper. If you underwent a bowel prep for your procedure, you may not have a normal bowel movement for a few days.  Please Note:  You might notice some irritation and congestion in your nose or some drainage.  This is from the oxygen used during your procedure.  There is no need for concern and it should clear up in a day or so.  SYMPTOMS TO REPORT IMMEDIATELY:   Following upper endoscopy (EGD)  Vomiting of blood or coffee ground material  New chest pain or pain under the shoulder blades  Painful or persistently difficult swallowing  New shortness of breath  Fever of 100F or higher  Black, tarry-looking stools  For urgent or emergent issues, a gastroenterologist can be reached at any hour by calling [phone]. Do not use MyChart messaging for urgent concerns.    DIET:  We do recommend a small meal at first, but then  you may proceed to your regular diet.  Drink plenty of fluids but you should avoid alcoholic beverages for 24 hours.  ACTIVITY:  You should plan to take it easy for the rest of today and you should NOT DRIVE or use heavy machinery until tomorrow (because of the sedation medicines used during the test).    FOLLOW UP: Our staff will call the number listed on your records 48-72 hours following your procedure to check on you and address any questions or concerns that you may have regarding the information given to you following your procedure. If we do not reach you, we will leave a message.  We will attempt to reach you two times.  During this call, we will ask if you have developed any symptoms of COVID 19. If you develop any symptoms (ie: fever, flu-like symptoms, shortness of breath, cough etc.) before then, please call [phone].  If you test positive for Covid 19 in the 2 weeks post procedure, please call and report this information to [place].    If any biopsies were taken you will be contacted by phone or by letter within the next 1-3 weeks.  Please call us at [phone] if you have not heard about the biopsies in 3 weeks.    SIGNATURES/CONFIDENTIALITY: You and/or your care partner have signed paperwork which will be entered into your electronic medical record.  These signatures attest to the fact that that the information above on your After Visit Summary has been reviewed and is understood.  Full  responsibility of the confidentiality of this discharge information lies with you and/or your care-partner.

## 2021-06-10 NOTE — Progress Notes (Signed)
Report to PACU, RN, vss, BBS= Clear.  

## 2021-06-10 NOTE — Op Note (Signed)
[facility] Patient Name: [name] Procedure Date: 06/10/2021 7:57 AM MRN: [id number] Endoscopist: Tressia Danas MD, MD Age: 35 Referring MD:  Date of Birth: [birth year] Gender: Female Account #: [id number] Procedure:                Upper GI endoscopy Indications:              Abdominal pain, Nausea with vomiting                           History of H pylori negative gastric ulcer and                            erosions on EGD 07/13/19 Medicines:                Monitored Anesthesia Care Procedure:                Pre-Anesthesia Assessment:                           - Prior to the procedure, a History and Physical                            was performed, and patient medications and                            allergies were reviewed. The patient's tolerance of                            previous anesthesia was also reviewed. The risks                            and benefits of the procedure and the sedation                            options and risks were discussed with the patient.                            All questions were answered, and informed consent                            was obtained. Prior Anticoagulants: The patient has                            taken no previous anticoagulant or antiplatelet                            agents. ASA Grade Assessment: III - A patient with                            severe systemic disease. After reviewing the risks                            and benefits, the patient was deemed in  satisfactory condition to undergo the procedure.                           After obtaining informed consent, the endoscope was                            passed under direct vision. Throughout the                            procedure, the patient's blood pressure, pulse, and                            oxygen saturations were monitored continuously. The                            GIF [id number] #[id number] was introduced  through the                            mouth, and advanced to the third part of duodenum.                            The upper GI endoscopy was accomplished without                            difficulty. The patient tolerated the procedure                            well. Scope In: Scope Out: Findings:                 LA Grade A (one or more mucosal breaks less than 5                            mm, not extending between tops of 2 mucosal folds)                            esophagitis with no bleeding was found 34 cm from                            the incisors. Biopsies were taken with a cold                            forceps for histology. Estimated blood loss was                            minimal.                           Multiple erosions with no bleeding and no stigmata                            of recent bleeding were found in the gastric  antrum. Biopsies were taken from the antrum, body,                            and fundus with a cold forceps for histology.                            Estimated blood loss was minimal.                           A small hiatal hernia was present.                           The examined duodenum was normal. Complications:            No immediate complications. Estimated blood loss:                            Minimal. Estimated Blood Loss:     Estimated blood loss was minimal. Impression:               - LA Grade A reflux esophagitis with no bleeding.                            Biopsied.                           - Erosive gastropathy with no bleeding and no                            stigmata of recent bleeding. Biopsied.                           - Small hiatal hernia.                           - Normal examined duodenum. Recommendation:           - Patient has a contact number available for                            emergencies. The signs and symptoms of potential                            delayed complications were  discussed with the                            patient. Return to normal activities tomorrow.                            Written discharge instructions were provided to the                            patient.                           - Resume previous diet.                           -  Continue present medications including                            pantoprazole at 40 mg BID                           - Complete Carafate 1 g QID x 2 weeks.                           - No aspirin, ibuprofen, naproxen, or other                            non-steroidal anti-inflammatory drugs.                           - Await pathology results.                           - Office follow-up in 4 weeks, earlier if needed. Tressia Danas MD, MD 06/10/2021 8:13:10 AM This report has been signed electronically.

## 2021-06-10 NOTE — Progress Notes (Signed)
Referring Provider: [facility] Primary Care Physician:  [facility]  Indication for procedure:  Abdominal pain   IMPRESSION:  Recurrent epigastric pain with associated nausea and vomiting Gastric ulcer and erosions 07/13/19    - biopsies negative for H pylori  Bloating    - duodenal biopsies negative for celiac disease Medium sized hiatal hernia Intermittent nausea and vomiting Abnormal abdominal CT suggesting colitis versus under distended colon 01/2019 Stool burden on abdominal x-rays 03/2019 Patient and family concerns for symptomatic hernia Appropriate candidate for monitored anesthesia care in [facility]   PLAN: EGD   HPI: [name] is a 35 y.o. female who presents for endoscopy to evaluate epigastric abdominal pain with nausea and vomiting.  See my office note from 06/05/21. There has been no change in history or physical exam since that time.  Past Medical History:  Diagnosis Date   Anxiety    Asthma    Bipolar 1 disorder (HCC)    Bipolar 1 disorder (HCC)    Cataract    left eye   Depression    Endometriosis    GERD (gastroesophageal reflux disease)    Mental disorder    Migraine     Past Surgical History:  Procedure Laterality Date   ABDOMINAL SURGERY     "For endometriosis"   CHOLECYSTECTOMY     HERNIA REPAIR     OOPHORECTOMY Right 08/30/2010   TUBAL LIGATION      Current Outpatient Medications  Medication Sig Dispense Refill   ASPIRIN ADULT PO Take 350 mg by mouth daily.     clonazePAM (KLONOPIN) 1 MG tablet Take 1 mg by mouth 3 (three) times daily as needed for anxiety.      fluticasone (FLOVENT HFA) 110 MCG/ACT inhaler Inhale into the lungs.     omeprazole (PRILOSEC) 40 MG capsule Take 1 capsule (40 mg total) by mouth daily. 90 capsule 1   ondansetron (ZOFRAN-ODT) 8 MG disintegrating tablet Take 8 mg by mouth every 8 (eight) hours as needed for nausea or vomiting.     topiramate (TOPAMAX) 100 MG  tablet Take 100 mg by mouth 2 (two) times daily.     valACYclovir (VALTREX) 500 MG tablet Take 500 mg by mouth daily.     cariprazine (VRAYLAR) 1.5 MG capsule Take 1.5 mg by mouth daily.     PROAIR HFA 108 (90 Base) MCG/ACT inhaler SMARTSIG:1-2 Puff(s) Via Inhaler PRN     rOPINIRole (REQUIP) 3 MG tablet SMARTSIG:1 Tablet(s) By Mouth Every Evening     sucralfate (CARAFATE) 1 GM/10ML suspension Take 10 mLs (1 g total) by mouth 4 (four) times daily. 1200 mL 0   Vitamin D, Ergocalciferol, (DRISDOL) 1.25 MG (50000 UNIT) CAPS capsule Take 50,000 Units by mouth every 7 (seven) days. (Patient not taking: Reported on 06/10/2021)     Current Facility-Administered Medications  Medication Dose Route Frequency Provider Last Rate Last Admin   0.9 %  sodium chloride infusion  500 mL Intravenous Continuous Tressia Danas, MD       Facility-Administered Medications Ordered in Other Visits  Medication Dose Route Frequency Provider Last Rate Last Admin   sodium chloride flush (NS) 0.9 % injection 20 mL  20 mL Intravenous PRN Naomie Dean B, MD   20 mL at 02/11/20 1301    Allergies as of 06/10/2021 - Review Complete 06/10/2021  Allergen Reaction Noted   Bee venom Anaphylaxis and Swelling 01/15/2015   Coconut flavor Anaphylaxis 09/23/2014   Ibuprofen Hives  07/22/2017   Naproxen Nausea And Vomiting 05/03/2017   Tramadol Other (See Comments) 11/20/2019    Family History  Problem Relation Age of Onset   Stroke Mother    Hyperlipidemia Other    Colon cancer Neg Hx    Stomach cancer Neg Hx    Pancreatic cancer Neg Hx    Esophageal cancer Neg Hx        Physical Exam: General:   Alert,  well-nourished, pleasant and cooperative in NAD Head:  Normocephalic and atraumatic. Eyes:  Sclera clear, no icterus.   Conjunctiva pink. Abdomen:  Soft, nontender, nondistended, normal bowel sounds, no rebound or guarding. No hepatosplenomegaly.   Msk:  Symmetrical. No obvious boney deformities LAD: No inguinal  or umbilical LAD Extremities:  No clubbing or edema. Neurologic:  Alert and  oriented x4;  grossly nonfocal Skin:  Intact without significant lesions or rashes. Psych:  Alert and cooperative. Normal mood and affect.    Anik Wesch L. Orvan Falconer, MD, MPH 06/10/2021, 7:54 AM

## 2021-06-12 ENCOUNTER — Telehealth: Payer: Self-pay | Admitting: *Deleted

## 2021-06-12 ENCOUNTER — Telehealth: Payer: Self-pay

## 2021-06-12 NOTE — Telephone Encounter (Signed)
First post procedure follow up call, no answer 

## 2021-06-12 NOTE — Telephone Encounter (Signed)
  Follow up Call-  Call back number 06/10/2021 07/13/2019  Post procedure Call Back phone  # [phone]  Permission to leave phone message Yes Yes  Some recent data might be hidden     Patient questions:  Do you have a fever, pain , or abdominal swelling? No. Pain Score  0 *  Have you tolerated food without any problems? Yes.    Have you been able to return to your normal activities? Yes.    Do you have any questions about your discharge instructions: Diet   No. Medications  No. Follow up visit  No.  Do you have questions or concerns about your Care? No.  Actions: * If pain score is 4 or above: No action needed, pain <4.  Have you developed a fever since your procedure? no  2.   Have you had an respiratory symptoms (SOB or cough) since your procedure? no  3.   Have you tested positive for COVID 19 since your procedure no  4.   Have you had any family members/close contacts diagnosed with the COVID 19 since your procedure?  no   If yes to any of these questions please route to Laverna Peace, RN and Karlton Lemon, RN

## 2021-06-12 NOTE — Telephone Encounter (Signed)
Received notification from Dr. Orvan Falconer that pt will require 4 week f/u s/p EGD 06/10/21, eval  esophagitis. LVM requesting returned call.

## 2021-06-15 NOTE — Telephone Encounter (Signed)
SECOND ATTEMPT: ° °LVM requesting returned call. °

## 2021-06-17 NOTE — Telephone Encounter (Signed)
FINAL ATTEMPT:  Called pt to schedule f/u appt as below. Pt advised this was not a good time but asked me to go ahead and schedule appt, mail reminder, and if she cannot make appt, will call to reschedule. Appt reminder mailed as requested. Appt as below:  Next Appt With Gastroenterology Tressia Danas, MD)07/16/2021 at  1:50 PM

## 2021-06-19 IMAGING — RF DG UGI W/ HIGH DENSITY W/O KUB
11 of 16 series · 14 of 24 positions shown · non-contrast
Comparison: None.

CLINICAL DATA: 33-year-old female with right upper quadrant
abdominal pain, bloating and distension since pregnancy 9 years ago.

EXAM:
UPPER GI SERIES WITH KUB
TECHNIQUE: After obtaining a scout radiograph a routine upper GI series was
performed using thin and thick barium as well as effervescent
crystals.
FLUOROSCOPY TIME:  Fluoroscopy Time:  2 minutes and 18 seconds
Radiation Exposure Index (if provided by the fluoroscopic device):
34.1 mGy
Number of Acquired Spot Images: 0

[Series 1: t abdomen supine · 0.15mm/px · 1 of 1 slices shown]
[im 1/1]
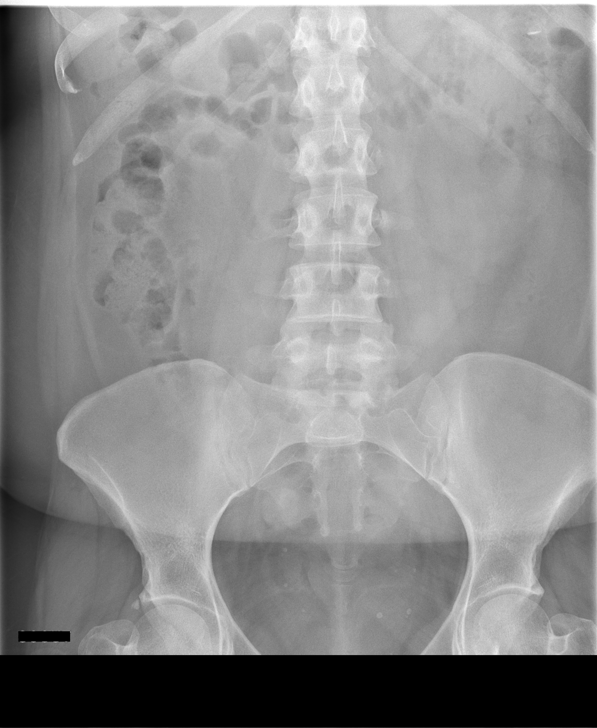

[Series 4: fluoro_barium singleshot_bw · 0.17mm/px · 1 of 1 slices shown (1 of 5)]
[im 1/1]
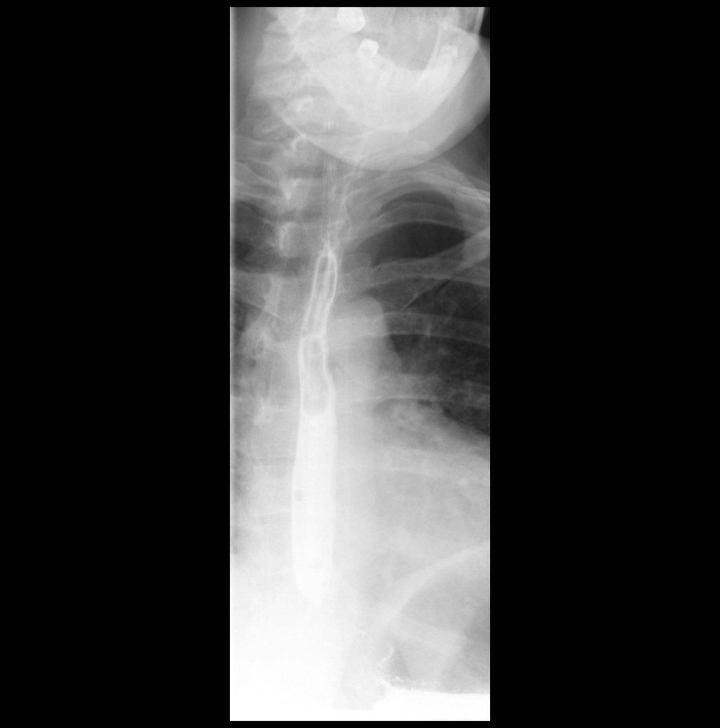

[Series 6: fluoro_barium singleshot_bw · 0.17mm/px · 1 of 1 slices shown (2 of 5)]
[im 1/1]
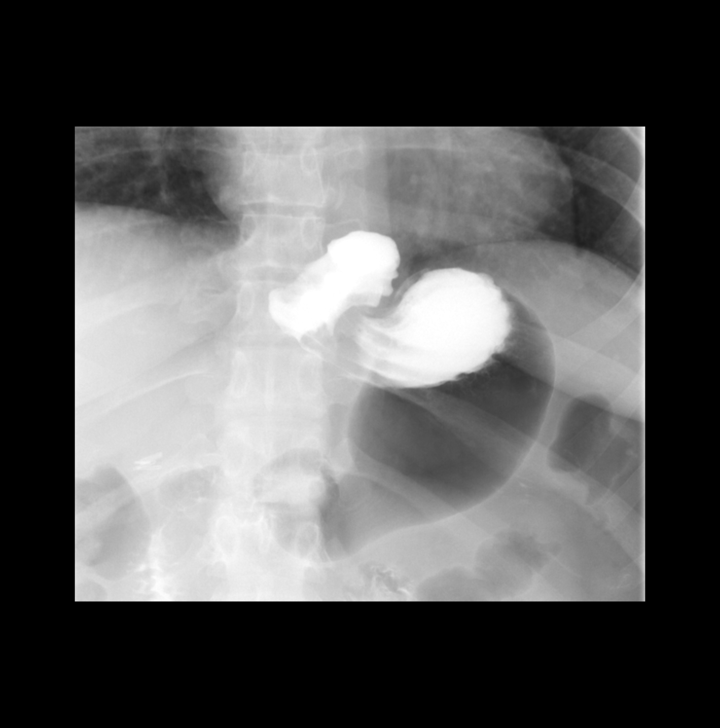

[Series 9: fluoro_barium singleshot_bw · 0.17mm/px · 1 of 1 slices shown (3 of 5)]
[im 1/1]
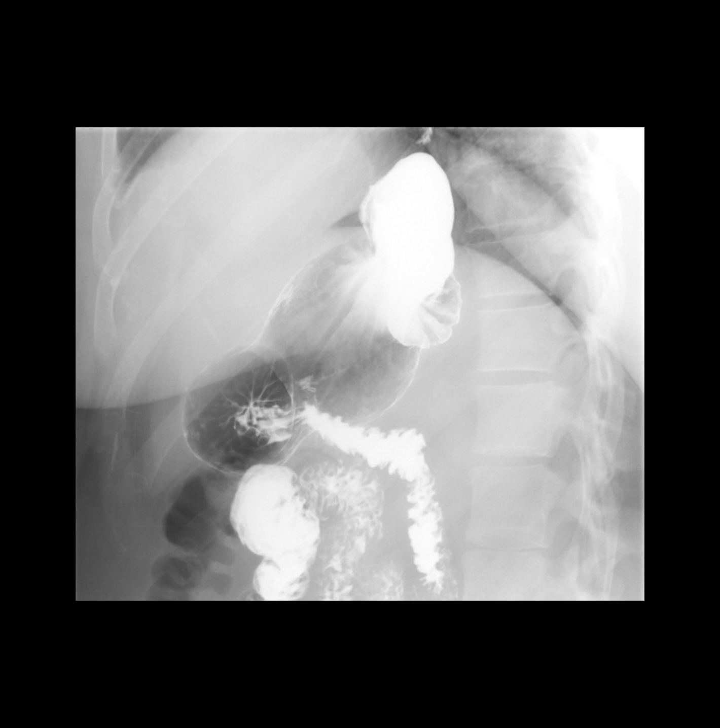

[Series 10: fluoro_barium singleshot_bw · 0.17mm/px · 1 of 1 slices shown (4 of 5)]
[im 1/1]
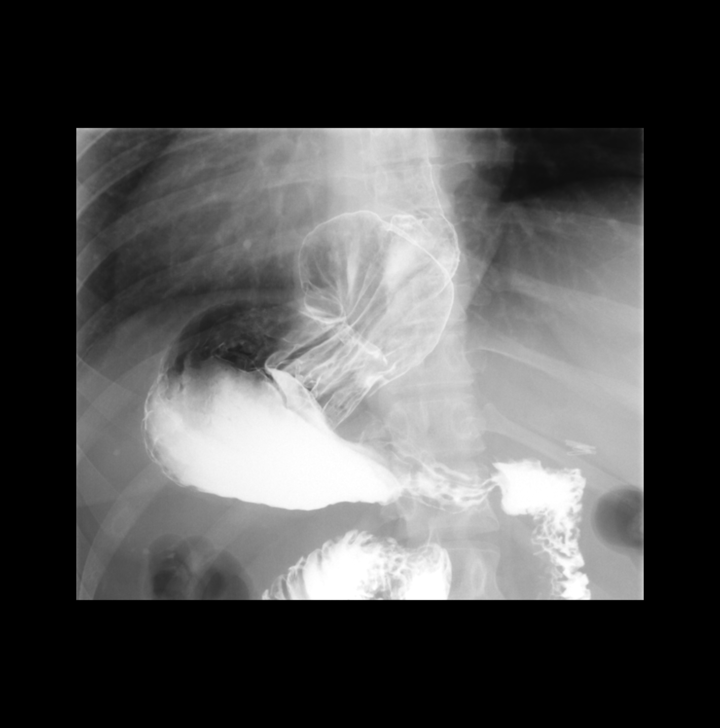

[Series 13: cp_standard · 0.52mm/px · 2 of 32 frames shown (1 of 5)]
[frame 1/32]
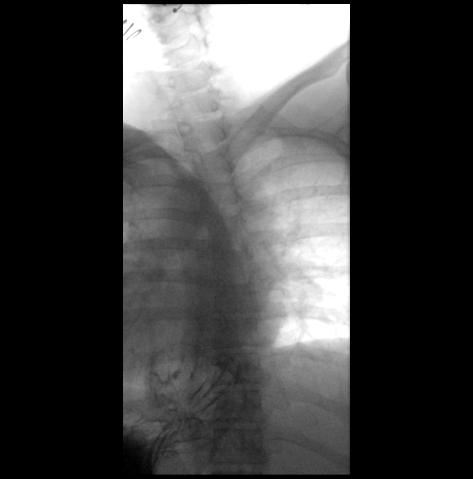
[frame 28/32]
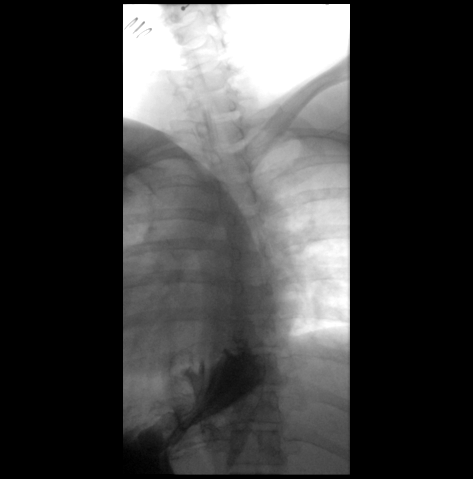

[Series 14: cp_standard · 0.52mm/px · 2 of 48 frames shown (2 of 5)]
[frame 8/48]
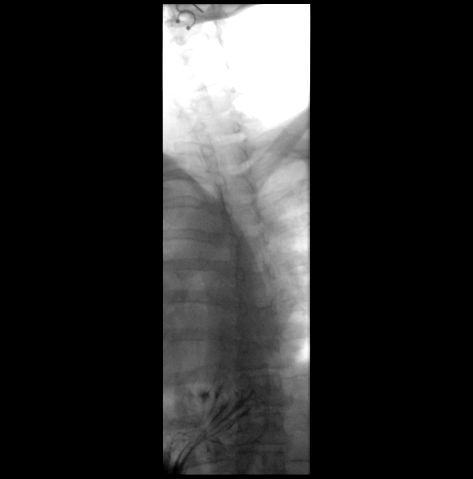
[frame 46/48]
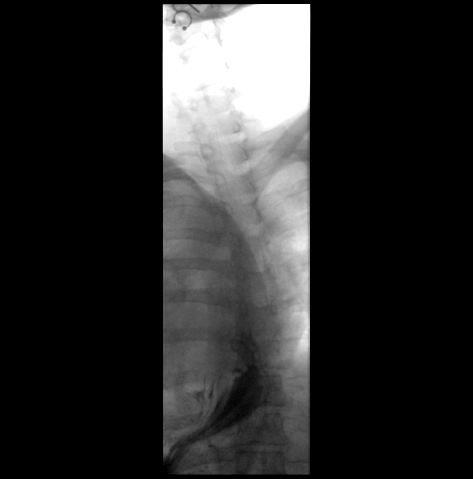

[Series 15: cp_standard · 0.52mm/px · 1 of 29 frames shown (3 of 5)]
[frame 25/29]
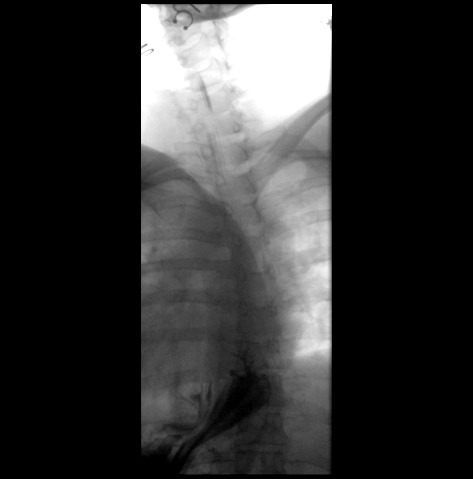

[Series 16: fluoro_barium singleshot_bw · 0.17mm/px · 1 of 1 slices shown (5 of 5)]
[im 1/1]
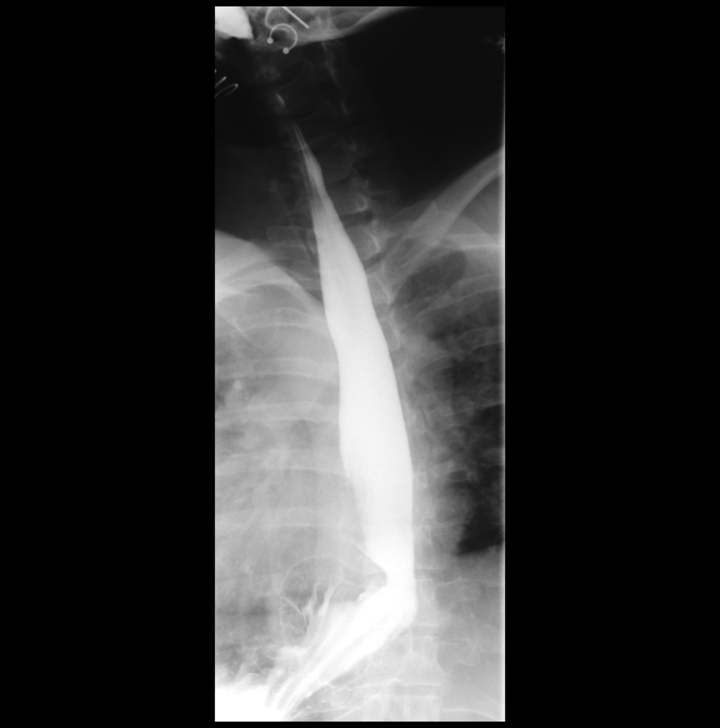

[Series 18: cp_standard · 0.53mm/px · 2 of 46 frames shown (4 of 5)]
[frame 3/46]
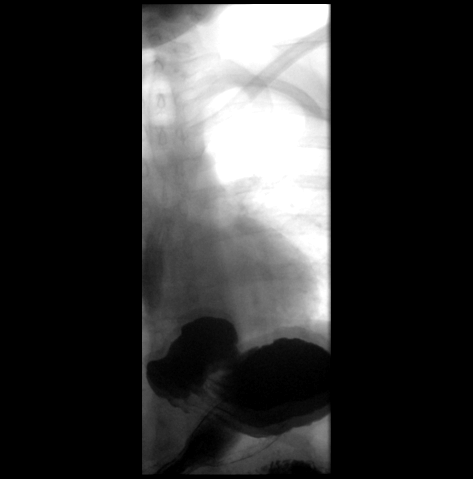
[frame 24/46]
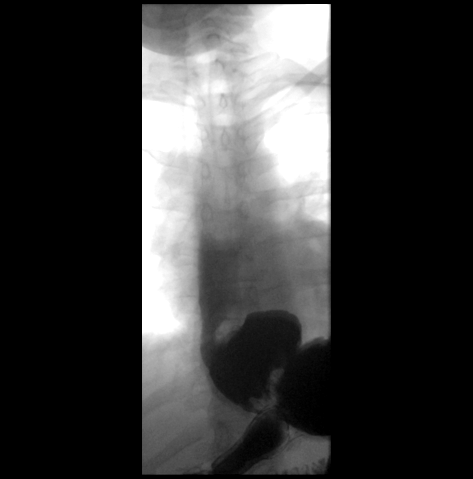

[Series 20: cp_standard · 0.26mm/px · 1 of 1 slices shown (5 of 5)]
[im 1/1]
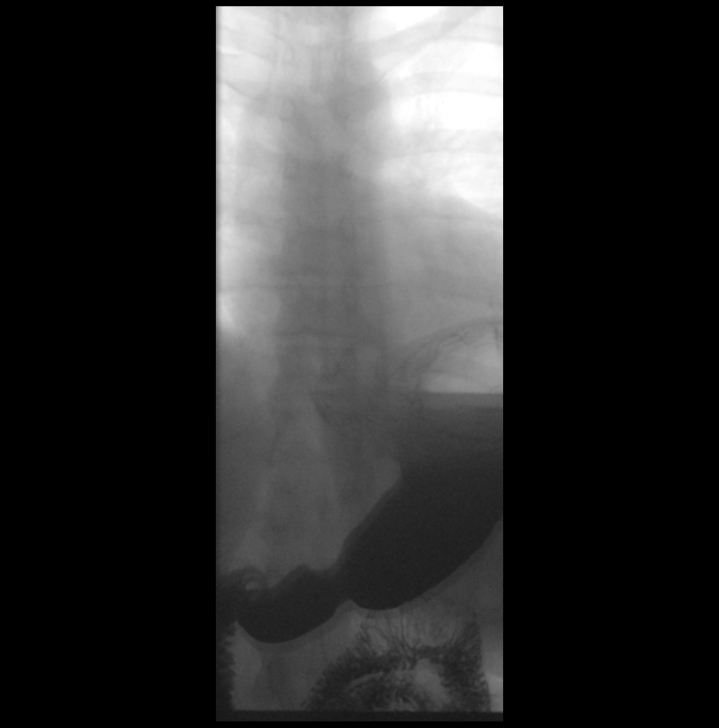

[14 of 24 positions shown; findings below may reference images not displayed]

FINDINGS: Preprocedural KUB demonstrates a nonobstructive bowel gas pattern.
Numerous pelvic phleboliths are incidentally noted.

Initial double contrast images of the esophagus demonstrate normal
appearance of the esophageal mucosa. Moderate-sized hiatal hernia.
No esophageal mass, stricture or esophageal ring. Multiple single
swallow attempts were observed which demonstrated normal propagation
of primary peristaltic waves. No tertiary contractions. Water siphon
test demonstrated extensive gastroesophageal reflux. A barium tablet
was administered, which passed readily into the stomach.

Double contrast images of the stomach demonstrated no mucosal
abnormality or gastric mass. Duodenal bulb was normal in appearance.
IMPRESSION: 1. Moderate sized hiatal hernia.
2. Extensive gastroesophageal reflux witnessed during water siphon
test.
3. Normal esophageal motility.
4. Other than the presence of the hiatal hernia, the appearance of
the stomach was unremarkable.

## 2021-06-21 ENCOUNTER — Encounter: Payer: Self-pay | Admitting: Gastroenterology

## 2021-07-16 ENCOUNTER — Ambulatory Visit (INDEPENDENT_AMBULATORY_CARE_PROVIDER_SITE_OTHER): Payer: Medicaid Other | Admitting: Gastroenterology

## 2021-07-16 ENCOUNTER — Encounter: Payer: Self-pay | Admitting: Gastroenterology

## 2021-07-16 VITALS — BP 110/76 | HR 70 | Ht 62.0 in | Wt 154.2 lb

## 2021-07-16 DIAGNOSIS — K2100 Gastro-esophageal reflux disease with esophagitis, without bleeding: Secondary | ICD-10-CM

## 2021-07-16 DIAGNOSIS — R1012 Left upper quadrant pain: Secondary | ICD-10-CM

## 2021-07-16 MED ORDER — FAMOTIDINE 20 MG PO TABS: 20.0000 mg | ORAL_TABLET | Freq: Two times a day (BID) | ORAL | 3 refills | Status: DC

## 2021-07-16 NOTE — Patient Instructions (Addendum)
If you are age 34 or younger, your body mass index should be between 19-25. Your Body mass index is 28.21 kg/m. If this is out of the aformentioned range listed, please consider follow up with your Primary Care Provider. ________________________________________________________  The [place] [facility] providers would like to encourage you to use [facility] to communicate with providers for non-urgent requests or questions.  Due to long hold times on the telephone, sending your provider a message by [facility] may be a faster and more efficient way to get a response.  Please allow 48 business hours for a response.  Please remember that this is for non-urgent requests.  _______________________________________________________   I am glad that you are feeling better, but, we obviously have some work to do to improve control of your abdominal pain.  Please continue to take pantoprazole 40 mg twice daily. I recommend that you start taking famotidine 20 mg twice daily in addition to the pantoprazole.  We agreed that you would stop the Carafate and not use it again.  I am hoping that the medication change helps with your esophagitis and gastritis. However, modifying diet and lifestyle remains the foundation for treating the symptoms of esophagitis and gastritis.   The following strategies help by avoiding foods that reduce the effectiveness of the bottom of the esophagus from protecting the esophagus from from acid injury and keeping stomach contents where they belong.  Eat smaller meals. A large meal remains in the stomach for several hours, increasing the chances for gastroesophageal reflux. Try distributing your daily food intake over three, four, or five smaller meals.  Relax when you eat. Stress increases the production of stomach acid, so make meals a pleasant, relaxing experience. Sit down. Eat slowly. Chew completely. Play soothing music.  Relax between meals. Relaxation therapies such as deep breathing,  meditation, massage, tai chi, or yoga may help prevent and relieve heartburn.   Remain upright after eating. You should maintain postures that reduce the risk for reflux for at least three hours after eating. For example, don't bend over or strain to lift heavy objects.  Avoid eating within three hours of going to bed. Lying down after eating will increase chances of reflux.  Lose weight. Excess pounds increase pressure on the stomach and can push acid into the esophagus.  Loosen up. Avoid tight belts, waistbands, and other clothing that puts pressure on your stomach.  Avoid foods that burn. Abstain from food or drink that increases gastric acid secretion, decreases the valve at the bottom of the esophagus, or slows the emptying of the stomach. Known offenders include high-fat foods, spicy dishes, tomatoes and tomato products, citrus fruits, garlic, onions, milk, carbonated drinks, coffee (including decaf), tea, chocolate, mints, and alcohol.  Stop smoking. Nicotine stimulates stomach acid.  Avoid medications that can predispose you to reflux including aspirin and other NSAIDs, oral contraceptives, hormone therapy drugs, and certain antidepressants.  Sleep at an angle. If you're bothered by nighttime heartburn, place a wedge (available in medical supply stores or a wedge pillow through [employer]) under your upper body. But don't elevate your head with extra pillows. That makes reflux worse by bending you at the waist and compressing your stomach. You might also try sleeping on your left side, as studies have shown this can help--perhaps because the stomach is on the left side of the body, so lying on your left positions most of the stomach below the bottom of the esophagus.   Your ultrasound, CT scan, and blood work showed  normal pancreas and pancreatic enzymes. I think pancreatitis is an unlikely cause of your pain.   You are scheduled to follow up on 09-02-21 at 11:10am.  Please let me know if you  have any questions or concerns prior to that time.   Thank you for entrusting me with your care and choosing [facility].  Dr Tarri Glenn

## 2021-07-16 NOTE — Progress Notes (Signed)
Referring Provider: [facility] Primary Care Physician:  [facility]  Chief complaint:  Abdominal pain   IMPRESSION:  LA Class A reflux esophagitis, gastritis, and gastric erosions on EGD 06/10/21    - esophageal biopsies confirm reflux, no evidence for eosinophilic esophagitis    - gastric biopsies negative for H pylori Recurrent upper abdominal pain with associated nausea and vomiting    - not explained by recent ultrasound or CT    - likely caused by esophagitis, gastritis, and gastric erosions    - no improvement to 10 days with marijuana    - no additional symptomatic improvement with Carafate History of gastric ulcer and erosions on EGD 07/13/19    - biopsies negative for H pylori  Bloating    - duodenal biopsies negative for celiac disease Medium sized hiatal hernia Abnormal abdominal CT suggesting colitis versus under distended colon 01/2019    - findings not seen on CT 04/25/2021 Stool burden on abdominal x-rays 03/2019 Patient and family concerns for symptomatic hernia Cholecystectomy many years ago    PLAN: - Continue pantoprazole to 40 mg BID for at least 8 weeks - Add famotidine 20 mg BID - Reviewed reflux lifestyle modifications - Provided reassurance about normal pancreatic enzymes and pancreatic enzymes 04/2021 - Avoid all NSAIDs - Consider trial of dicyclomine is symptoms persist - Office follow-up in 4-8 weeks, earlier if needed  Please see the "Patient Instructions" section for addition details about the plan.  HPI: [name] is a 35 y.o. female who returns in follow-up after her last office visit 06/05/21 and subsequent endoscopic evaluation 06/10/21 to evaluate epigastric abdominal pain with nausea and vomiting.    At the time of her initial consultation in 2020 she reported the onset of abdominal problems following her cholecystectomy 9 years ago that was performed during pregnancy. Heartburn and  epigastric burning developed 5 months after her cholecystectomy.  Symptoms were intermittent and occasionally associated with non-radiating RUQ cramping and aching. Localized to the area where her gallbladder used to be. Associated bloating, distension, nausea and vomiting. Symptoms are similar to those prior to her cholecystectomy.  No change in symptoms with eating, defecation, or movement. Spicy foods, pizza, spaghetti, and lasagna seem to trigger her symptoms.   She had been using sucralfate QID and prilosec PRN without significant improvement. Ranitidine provided relief of her heartburn.  No benefit from Tums.  Ondansetron provided only temporary benefit.  She was started on pantoprazole in October. She cut back on smoking using a water vapor device and was able to eliminate caffeine and carbonated beverages.  She denies use of any NSAIDs.  A CT performed during an ER evaluation 02/17/2019 showed mild diffuse circumferential colonic wall thickening that could be due to under distention throughout the colon.  No source for her abdominal pain identified.  She was treated with Cipro, Flagyl, ondansetron, and oxycodone.  She was subsequently seen in the urgent care 04/12/2019 for constipation, nausea, and vomiting.  Stool studies including ova and parasites were negative.  EGD 07/13/19 showed a normal esophagus, a medium sized hiatal hernia, gastric erosions in the body and antrum, and a 59mm gastric antral ulcer. Gastric biopsies showed reactive gastropathy. There was no H pylori. Duodenal biopsies were negative.    At the time of her last office visit 08/14/2019 she noted no improvement with pantoprazole or FD guard.  She had previously used BuSpar for anxiety, did not find it helpful with the anxiety,  and does not remember her GI symptoms being better tolerated.  Her grandmother accompanied her and was concerned that her symptoms were due to hernia requiring surgical repair.  She has been seen in the ER 3  times for recurrent upper abdominal pain, nausea, and vomiting, most recently 05/05/21. Also having dysphagia to solids particularly bread.   CT of the abdomen pelvis with contrast 04/25/2021 showed no acute intra-abdominal process.  Abdominal ultrasound 05/05/2021 showed evidence for prior cholecystectomy but was otherwise normal.  She uses marijuana some days to try to help with the symptoms. Symptoms worsened by eating red meat and red sauce. Diet has been reduced to primarily chicken. She had been using a friend's "ulcer pills" that have been helping with the pain.  Symptom escalation coincided with stresses related to custody of her son and a pending court date to follow-up on a broken restraining order. She was concerned that her hernia is back. Her grandmother remains concerned about a hernia, as well.    Prior abdominal imaging: - Intraoperative cholangiogram 04/17/2019: Normal - CT of the abdomen and pelvis with contrast [id number]: Tiny umbilical hernia containing fat, post right oophorectomy, no acute abnormality - CT of the abdomen and pelvis with contrast 04/29/2015: Abnormality - CT renal stone study 05/03/2017: Small hiatal hernia, mild hydronephrosis otherwise normal - CT of the abdomen and pelvis with contrast 02/17/2019: Mild apparent diffuse circumferential colonic wall thickening potentially accentuated due to under distention otherwise no explanation for the patient's severe right upper quadrant abdominal pain, moderately sized hiatal hernia, prior cholecystectomy - Abdominal x-ray 04/12/2019: Moderate stool in the colon.  No bowel obstruction. - CT of the angio chest PE with or without contrast 05/29/2019: No PE, small airway disease/asthma, moderate hiatal hernia - CT of the abdomen pelvis with contrast 04/25/2021: no acute intra-abdominal process.   - Abdominal ultrasound 05/05/2021: evidence for prior cholecystectomy but was otherwise normal.  Endoscopic evaluation 06/10/21: EGD showed LA  Grade A reflux esophagitis, gastric erosions, and a small hiatal hernia. Gastric biopsies showed mild chronic inflammation. Esophageal biopsies confirmed reflux. There was no eosinophilic esophagitis.   She was advised to avoid all NSAIDs, continue pantoprazole 40 mg BID, and use two weeks of Carafate 1g QID.   She returns today in scheduled follow-up. She continues to have intermittent, sharp and dull, achy LUQ pain that has been worse over the last month. Intensity of symptoms fluctuates. She did not find the Carafate helpful. 10 days off marijuana did not improve her symptoms, either.  Her search on the internet has her concerned about pancreatitis.  No new complaints or concerns today.   Past Medical History:  Diagnosis Date   Anxiety    Asthma    Bipolar 1 disorder ([place])    Bipolar 1 disorder ([place])    Cataract    left eye   Depression    Endometriosis    GERD (gastroesophageal reflux disease)    Mental disorder    Migraine     Physical Exam: General:   Alert,  well-nourished, pleasant and cooperative in NAD Head:  Normocephalic and atraumatic. Eyes:  Sclera clear, no icterus.   Conjunctiva pink. Abdomen:  Soft, nontender, nondistended, normal bowel sounds, no rebound or guarding. No hepatosplenomegaly.   Msk:  Symmetrical. No obvious boney deformities LAD: No inguinal or umbilical LAD Extremities:  No clubbing or edema. Neurologic:  Alert and  oriented x4;  grossly nonfocal Skin:  Intact without significant lesions or rashes. Psych:  Alert and cooperative.  Normal mood and affect.    Jamiesha Victoria L. Orvan Falconer, MD, MPH 07/16/2021, 2:13 PM

## 2021-07-17 ENCOUNTER — Telehealth (HOSPITAL_COMMUNITY): Payer: Self-pay | Admitting: Licensed Clinical Social Worker

## 2021-07-17 ENCOUNTER — Ambulatory Visit (HOSPITAL_COMMUNITY): Payer: Medicaid Other | Admitting: Licensed Clinical Social Worker

## 2021-07-17 NOTE — Telephone Encounter (Signed)
LCSW sent two text links to patients' phone at 1100 and 1105 with no response. LCSW f/u with PC at 1110 with no response.  HIPAA compliant VM left for pt. LCSW waited until 1115 before disconnecting.

## 2021-07-24 ENCOUNTER — Other Ambulatory Visit: Payer: Self-pay

## 2021-07-24 ENCOUNTER — Emergency Department (HOSPITAL_COMMUNITY)
Admission: EM | Admit: 2021-07-24 | Discharge: 2021-07-25 | Disposition: A | Discharge status: Home / Self Care | Payer: Medicaid Other | Attending: Emergency Medicine | Admitting: Emergency Medicine

## 2021-07-24 ENCOUNTER — Emergency Department (HOSPITAL_COMMUNITY): Payer: Medicaid Other

## 2021-07-24 DIAGNOSIS — Z7951 Long term (current) use of inhaled steroids: Secondary | ICD-10-CM | POA: Insufficient documentation

## 2021-07-24 DIAGNOSIS — Y92410 Unspecified street and highway as the place of occurrence of the external cause: Secondary | ICD-10-CM | POA: Insufficient documentation

## 2021-07-24 DIAGNOSIS — M542 Cervicalgia: Secondary | ICD-10-CM | POA: Diagnosis not present

## 2021-07-24 DIAGNOSIS — J45909 Unspecified asthma, uncomplicated: Secondary | ICD-10-CM | POA: Diagnosis not present

## 2021-07-24 DIAGNOSIS — F17210 Nicotine dependence, cigarettes, uncomplicated: Secondary | ICD-10-CM | POA: Insufficient documentation

## 2021-07-24 DIAGNOSIS — M25562 Pain in left knee: Secondary | ICD-10-CM | POA: Insufficient documentation

## 2021-07-24 DIAGNOSIS — S0990XA Unspecified injury of head, initial encounter: Secondary | ICD-10-CM | POA: Diagnosis present

## 2021-07-24 MED ORDER — OXYCODONE-ACETAMINOPHEN 5-325 MG PO TABS
2.0000 | ORAL_TABLET | Freq: Once | ORAL | Status: AC
Start: 2021-07-24 — End: 2021-07-25
  Administered 2021-07-25: 2 via ORAL
  Filled 2021-07-24: qty 2
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
## 2021-07-25 MED ORDER — ACETAMINOPHEN 325 MG PO TABS: 650.0000 mg | ORAL_TABLET | Freq: Four times a day (QID) | ORAL | 0 refills | Status: AC | PRN

## 2021-07-25 MED ORDER — METHOCARBAMOL 500 MG PO TABS: 500.0000 mg | ORAL_TABLET | Freq: Two times a day (BID) | ORAL | 0 refills | Status: DC

## 2021-09-02 ENCOUNTER — Ambulatory Visit: Payer: Medicaid Other | Admitting: Gastroenterology

## 2022-07-05 ENCOUNTER — Other Ambulatory Visit: Payer: Self-pay | Admitting: Gastroenterology

## 2022-07-05 DIAGNOSIS — R1084 Generalized abdominal pain: Secondary | ICD-10-CM

## 2022-07-29 ENCOUNTER — Telehealth: Payer: Self-pay | Admitting: Gastroenterology

## 2022-07-29 DIAGNOSIS — R1084 Generalized abdominal pain: Secondary | ICD-10-CM

## 2022-07-29 NOTE — Telephone Encounter (Signed)
Inbound call from patient needing medication refill for protonix. Patient scheduled for OV jan of 2024. Please advise.

## 2022-07-30 MED ORDER — PANTOPRAZOLE SODIUM 40 MG PO TBEC: 40.0000 mg | DELAYED_RELEASE_TABLET | Freq: Two times a day (BID) | ORAL | 0 refills | Status: DC

## 2022-07-30 NOTE — Telephone Encounter (Signed)
Script sent into pharmacy 

## 2022-09-19 NOTE — Progress Notes (Signed)
Referring Provider: [facility] Primary Care Physician:  Patient, No Pcp Per  Chief complaint:  Medication refill   IMPRESSION:  LA Class A reflux esophagitis, gastritis, and gastric erosions on EGD 06/10/21    - esophageal biopsies confirm reflux, no evidence for eosinophilic esophagitis    - gastric biopsies negative for H pylori Recurrent upper abdominal pain with associated nausea and vomiting, improved    - not explained by recent ultrasound or CT    - likely caused by esophagitis, gastritis, and gastric erosions    - no improvement to 10 days with marijuana    - no additional symptomatic improvement with Carafate History of gastric ulcer and erosions on EGD 07/13/19    - biopsies negative for H pylori  Bloating    - duodenal biopsies negative for celiac disease Medium sized hiatal hernia Abnormal abdominal CT suggesting colitis versus under distended colon 01/2019    - findings not seen on CT 04/25/2021 Stool burden on abdominal x-rays 03/2019 Patient and family concerns for symptomatic hernia Cholecystectomy many years ago    PLAN: - Continue pantoprazole to 40 mg BID (one year refill provided) - Discontinue famotidine 20 mg BID - Reviewed reflux lifestyle modifications - Avoid all NSAIDs - Consider trial of dicyclomine is symptoms return - Office follow-up annually, earlier if needed  Please see the "Patient Instructions" section for addition details about the plan.  HPI: [name] is a 37 y.o. female who returns in follow-up. She was last seen 07/16/21. She is unaccompanied.  At the time of her initial consultation in 2020 she reported the onset of abdominal problems following her cholecystectomy 9 years ago that was performed during pregnancy. Heartburn and epigastric burning developed 5 months after her cholecystectomy.  Symptoms were intermittent and occasionally associated with non-radiating RUQ cramping and aching. Localized to the area  where her gallbladder used to be. Associated bloating, distension, nausea and vomiting. Symptoms are similar to those prior to her cholecystectomy.  No change in symptoms with eating, defecation, or movement. Spicy foods, pizza, spaghetti, and lasagna seem to trigger her symptoms.   She had been using sucralfate QID and prilosec PRN without significant improvement. Ranitidine provided relief of her heartburn.  No benefit from Tums.  Ondansetron provided only temporary benefit.  She was started on pantoprazole in October. She cut back on smoking using a water vapor device and was able to eliminate caffeine and carbonated beverages.  She denies use of any NSAIDs.  EGD 07/13/19 showed a normal esophagus, a medium sized hiatal hernia, gastric erosions in the body and antrum, and a 24mm gastric antral ulcer. Gastric biopsies showed reactive gastropathy. There was no H pylori. Duodenal biopsies were negative.    At the time of her  office visit 08/14/2019 she noted no improvement with pantoprazole or FD guard.  She had previously used BuSpar for anxiety, did not find it helpful with the anxiety, and does not remember her GI symptoms being better tolerated.  Her grandmother accompanied her and was concerned that her symptoms were due to hernia requiring surgical repair.  She has been seen in the ER 3 times for recurrent upper abdominal pain, nausea, and vomiting, most recently 05/05/21. Also having dysphagia to solids particularly bread.   CT of the abdomen pelvis with contrast 04/25/2021 showed no acute intra-abdominal process.  Abdominal ultrasound 05/05/2021 showed evidence for prior cholecystectomy but was otherwise normal.  She uses marijuana some days to try to help with the symptoms.  Symptoms worsened by eating red meat and red sauce. Diet has been reduced to primarily chicken. She had been using a friend's "ulcer pills" that have been helping with the pain.  Symptom escalation coincided with stresses related to  custody of her son and a pending court date to follow-up on a broken restraining order. She was concerned that her hernia is back. Her grandmother remains concerned about a hernia, as well.   She returns today with well-controlled symptoms on pantoprazole 40 mg BID. In fact, she is so pleased with symptom control that she does not want to try a dose taper at this time.    Prior abdominal imaging: - Intraoperative cholangiogram 04/17/2019: Normal - CT of the abdomen and pelvis with contrast 8/85/0277: Tiny umbilical hernia containing fat, post right oophorectomy, no acute abnormality - CT of the abdomen and pelvis with contrast 04/29/2015: Abnormality - CT renal stone study 05/03/2017: Small hiatal hernia, mild hydronephrosis otherwise normal - CT of the abdomen and pelvis with contrast 02/17/2019: Mild apparent diffuse circumferential colonic wall thickening potentially accentuated due to under distention otherwise no explanation for the patient's severe right upper quadrant abdominal pain, moderately sized hiatal hernia, prior cholecystectomy - Abdominal x-ray 04/12/2019: Moderate stool in the colon.  No bowel obstruction. - CT of the angio chest PE with or without contrast 05/29/2019: No PE, small airway disease/asthma, moderate hiatal hernia - CT of the abdomen pelvis with contrast 04/25/2021: no acute intra-abdominal process.   - Abdominal ultrasound 05/05/2021: evidence for prior cholecystectomy but was otherwise normal.  Endoscopic evaluation 06/10/21: EGD showed LA Grade A reflux esophagitis, gastric erosions, and a small hiatal hernia. Gastric biopsies showed mild chronic inflammation. Esophageal biopsies confirmed reflux. There was no eosinophilic esophagitis.    Past Medical History:  Diagnosis Date   Anxiety    Asthma    Bipolar 1 disorder ([place])    Bipolar 1 disorder ([place])    Cataract    left eye   Depression    Endometriosis    GERD (gastroesophageal reflux disease)    Mental  disorder    Migraine     Physical Exam: General:   Alert,  well-nourished, pleasant and cooperative in NAD Head:  Normocephalic and atraumatic. Eyes:  Sclera clear, no icterus.   Conjunctiva pink. Abdomen:  Soft, nontender, nondistended, normal bowel sounds, no rebound or guarding. No hepatosplenomegaly.   Msk:  Symmetrical. No obvious boney deformities LAD: No inguinal or umbilical LAD Extremities:  No clubbing or edema. Neurologic:  Alert and  oriented x4;  grossly nonfocal Skin:  Intact without significant lesions or rashes. Psych:  Alert and cooperative. Normal mood and affect.    Ganesh Deeg L. Tarri Glenn, MD, MPH 09/20/2022, 3:12 PM

## 2022-09-20 ENCOUNTER — Ambulatory Visit: Payer: Medicaid Other | Admitting: Gastroenterology

## 2022-09-20 ENCOUNTER — Encounter: Payer: Self-pay | Admitting: Gastroenterology

## 2022-09-20 VITALS — BP 102/62 | HR 68 | Ht 63.0 in | Wt 157.1 lb

## 2022-09-20 DIAGNOSIS — R1084 Generalized abdominal pain: Secondary | ICD-10-CM | POA: Diagnosis not present

## 2022-09-20 DIAGNOSIS — K2100 Gastro-esophageal reflux disease with esophagitis, without bleeding: Secondary | ICD-10-CM | POA: Diagnosis not present

## 2022-09-20 MED ORDER — PANTOPRAZOLE SODIUM 40 MG PO TBEC: 40.0000 mg | DELAYED_RELEASE_TABLET | Freq: Two times a day (BID) | ORAL | 3 refills | Status: DC

## 2022-09-20 NOTE — Patient Instructions (Signed)
It was a pleasure to see you today.  I did not change your medications. However, ideally we would have you on the lowest dose of pantoprazole needed to control your symptoms. You could try to cut back to one each morning if you are feeling like things are under good control.  I would like for you to return at least annually.

## 2023-04-04 IMAGING — CR DG CHEST 2V
2 series · 2 of 2 positions shown · non-contrast
Comparison: 01/08/2020

CLINICAL DATA: Chest pain

EXAM:
CHEST - 2 VIEW

[chest lat]
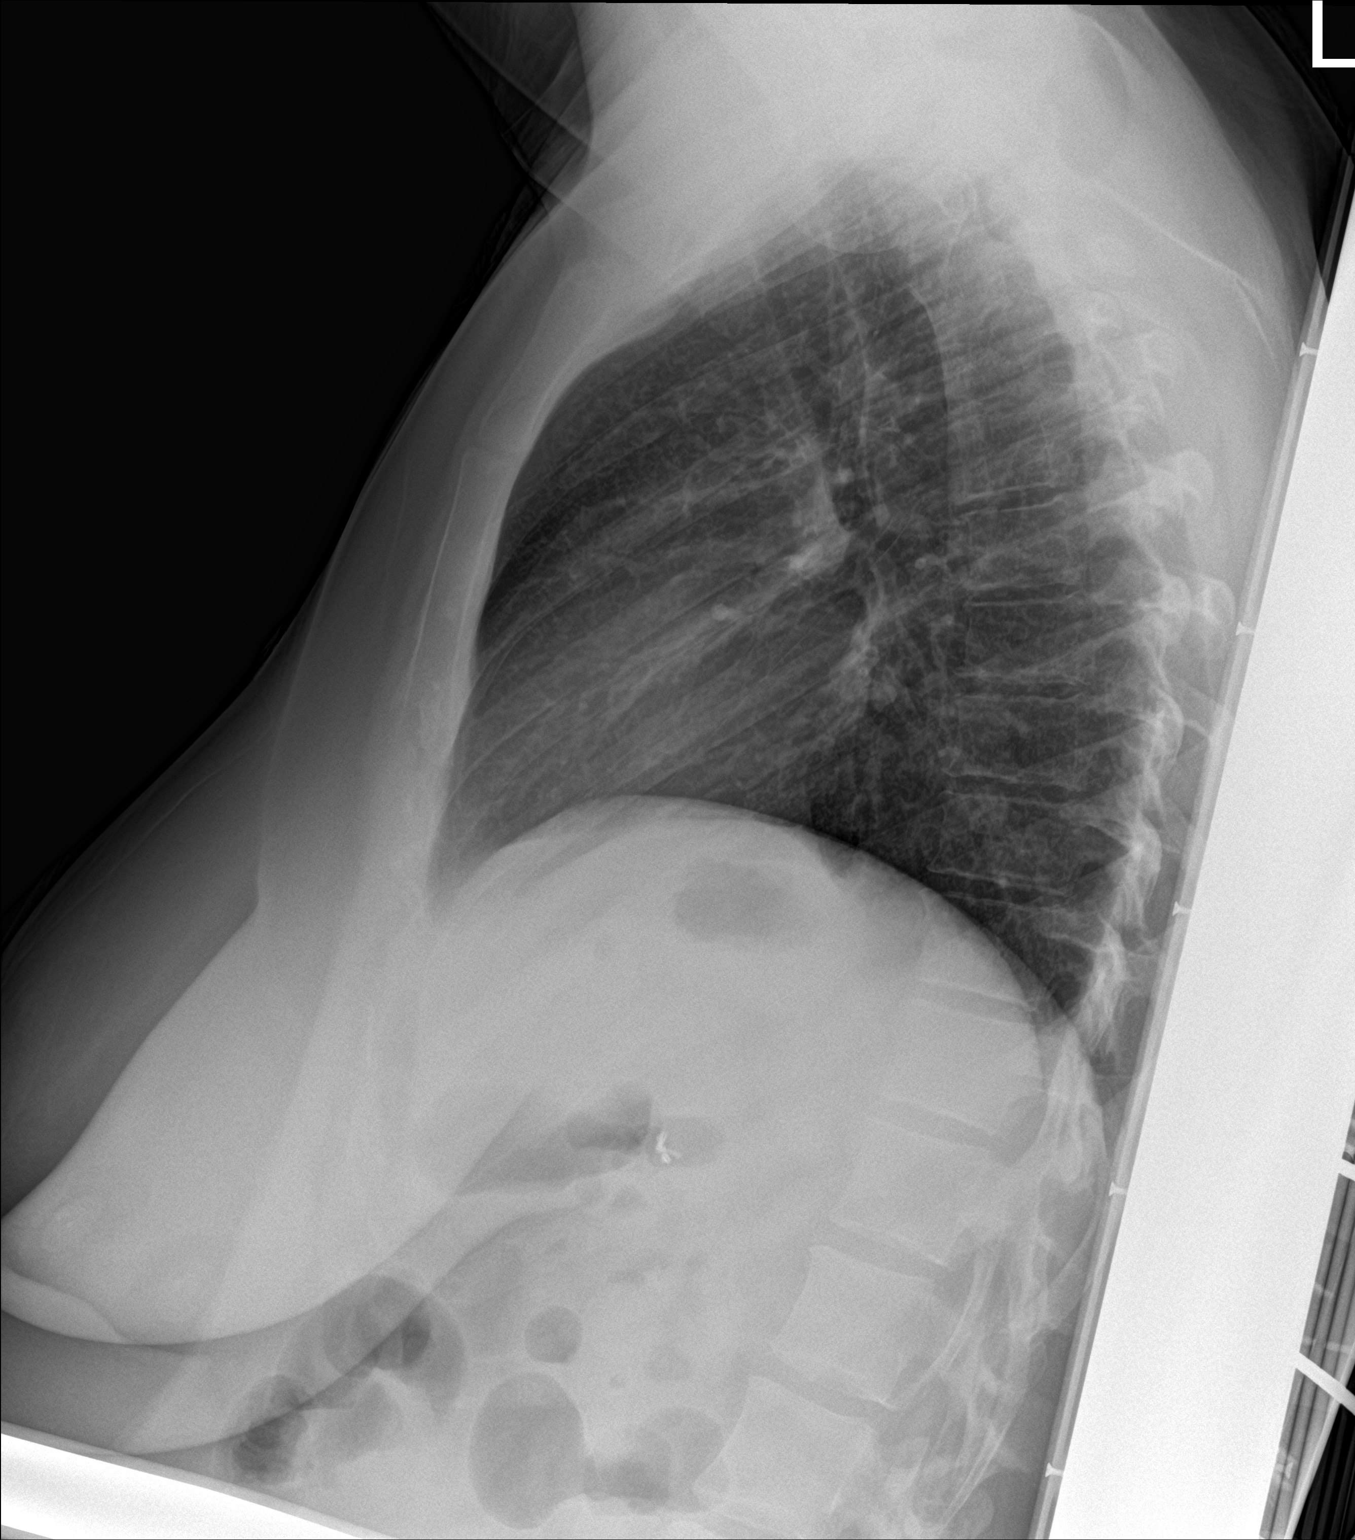

[chest ap]
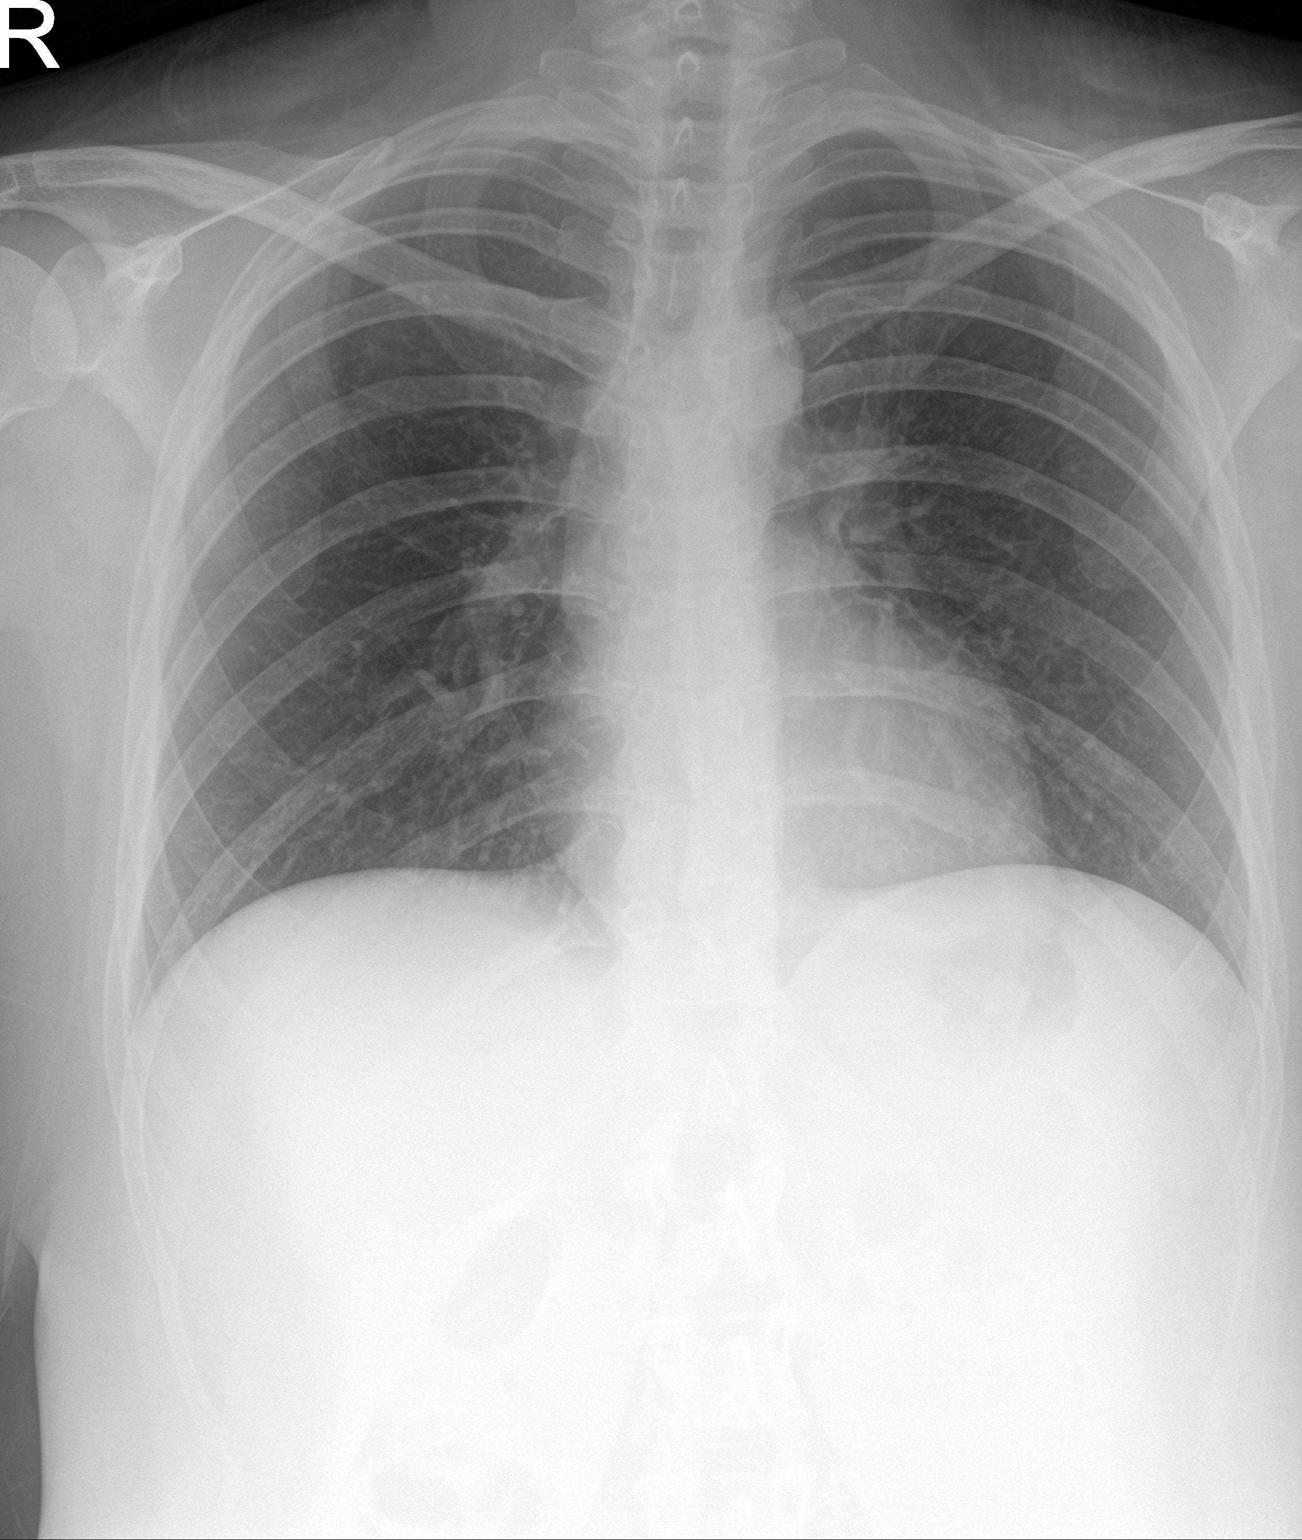

[2 of 2 positions shown; findings below may reference images not displayed]

FINDINGS: The heart size and mediastinal contours are within normal limits.
Both lungs are clear. The visualized skeletal structures are
unremarkable.
IMPRESSION: No active cardiopulmonary disease.

## 2023-05-04 ENCOUNTER — Emergency Department (HOSPITAL_COMMUNITY): Payer: MEDICAID

## 2023-05-04 ENCOUNTER — Other Ambulatory Visit: Payer: Self-pay

## 2023-05-04 ENCOUNTER — Emergency Department (HOSPITAL_COMMUNITY)
Admission: EM | Admit: 2023-05-04 | Discharge: 2023-05-04 | Disposition: A | Discharge status: Home / Self Care | Payer: MEDICAID | Attending: Emergency Medicine | Admitting: Emergency Medicine

## 2023-05-04 ENCOUNTER — Encounter (HOSPITAL_COMMUNITY): Payer: Self-pay

## 2023-05-04 DIAGNOSIS — Z7982 Long term (current) use of aspirin: Secondary | ICD-10-CM | POA: Insufficient documentation

## 2023-05-04 DIAGNOSIS — S060XAA Concussion with loss of consciousness status unknown, initial encounter: Secondary | ICD-10-CM

## 2023-05-04 DIAGNOSIS — S060X0A Concussion without loss of consciousness, initial encounter: Secondary | ICD-10-CM | POA: Diagnosis not present

## 2023-05-04 DIAGNOSIS — W01198A Fall on same level from slipping, tripping and stumbling with subsequent striking against other object, initial encounter: Secondary | ICD-10-CM | POA: Diagnosis not present

## 2023-05-04 DIAGNOSIS — S80212A Abrasion, left knee, initial encounter: Secondary | ICD-10-CM | POA: Insufficient documentation

## 2023-05-04 DIAGNOSIS — S0083XA Contusion of other part of head, initial encounter: Secondary | ICD-10-CM | POA: Diagnosis not present

## 2023-05-04 DIAGNOSIS — S0990XA Unspecified injury of head, initial encounter: Secondary | ICD-10-CM | POA: Diagnosis present

## 2023-05-04 DIAGNOSIS — W19XXXA Unspecified fall, initial encounter: Secondary | ICD-10-CM

## 2023-05-04 LAB — CBC WITH DIFFERENTIAL/PLATELET
Abs Immature Granulocytes: 0.01 10*3/uL (ref 0.00–0.07)
Basophils Absolute: 0.1 10*3/uL (ref 0.0–0.1)
Basophils Relative: 1 %
Eosinophils Absolute: 0.1 10*3/uL (ref 0.0–0.5)
Eosinophils Relative: 1 %
HCT: 44 % (ref 36.0–46.0)
Hemoglobin: 15 g/dL (ref 12.0–15.0)
Immature Granulocytes: 0 %
Lymphocytes Relative: 40 %
Lymphs Abs: 2.3 10*3/uL (ref 0.7–4.0)
MCH: 33.7 pg (ref 26.0–34.0)
MCHC: 34.1 g/dL (ref 30.0–36.0)
MCV: 98.9 fL (ref 80.0–100.0)
Monocytes Absolute: 0.4 10*3/uL (ref 0.1–1.0)
Monocytes Relative: 7 %
Neutro Abs: 2.9 10*3/uL (ref 1.7–7.7)
Neutrophils Relative %: 51 %
Platelets: 227 10*3/uL (ref 150–400)
RBC: 4.45 MIL/uL (ref 3.87–5.11)
RDW: 13.3 % (ref 11.5–15.5)
WBC: 5.7 10*3/uL (ref 4.0–10.5)
nRBC: 0 % (ref 0.0–0.2)

## 2023-05-04 LAB — BASIC METABOLIC PANEL
Anion gap: 9 (ref 5–15)
BUN: 8 mg/dL (ref 6–20)
CO2: 19 mmol/L — ABNORMAL LOW (ref 22–32)
Calcium: 8.9 mg/dL (ref 8.9–10.3)
Chloride: 106 mmol/L (ref 98–111)
Creatinine, Ser: 0.77 mg/dL (ref 0.44–1.00)
GFR, Estimated: >60 mL/min (ref >60)
Glucose, Bld: 103 mg/dL — ABNORMAL HIGH (ref 70–99)
Potassium: 3.5 mmol/L (ref 3.5–5.1)
Sodium: 134 mmol/L — ABNORMAL LOW (ref 135–145)

## 2023-05-04 LAB — HCG, QUANTITATIVE, PREGNANCY: hCG, Beta Chain, Quant, S: <1 m[IU]/mL (ref <5)

## 2023-05-04 LAB — CBG MONITORING, ED: Glucose-Capillary: 97 mg/dL (ref 70–99)

## 2023-05-04 MED ORDER — BACITRACIN ZINC 500 UNIT/GM EX OINT
TOPICAL_OINTMENT | Freq: Two times a day (BID) | CUTANEOUS | Status: DC
  Filled 2023-05-04: qty 1.8

## 2023-05-04 MED ORDER — ACETAMINOPHEN 500 MG PO TABS
1000.0000 mg | ORAL_TABLET | Freq: Once | ORAL | Status: AC
Start: 2023-05-04 — End: 2023-05-04
  Administered 2023-05-04: 1000 mg via ORAL
  Filled 2023-05-04: qty 2
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
## 2023-05-04 NOTE — ED Provider Notes (Cosign Needed Addendum)
[place] EMERGENCY DEPARTMENT AT [facility] Provider Note   CSN: [id number] Arrival date & time: 05/04/23  1114     History  No chief complaint on file.   [name] is a 37 y.o. female history of bipolar 1, anxiety, GERD presented after a fall 2 days ago.  Patient is that she was walking when she felt a feeling of warmth overcome her body and she fell face forward and hit her head.  Patient is unsure if she lost consciousness but denies any blood thinners or bleeding disorders.  Patient states that she has history of small mini strokes according to her primary care provider but states she has not had imaging and is concerned that she had a stroke.  Patient notes that her left knee also hurts from falling that she is able to bear weight however has to limp.  Patient notes that she has abrasions to her left knee as well.  Patient has been taking Tylenol at home which is been slightly helping.  Patient denies chest pain, vision changes, shortness of breath, abdominal pain, nausea/vomiting, changes in medication, tongue biting, urinary incontinence, dysuria, hematuria  Home Medications Prior to Admission medications   Medication Sig Start Date End Date Taking? Authorizing Provider  acetaminophen (TYLENOL) 325 MG tablet Take 2 tablets (650 mg total) by mouth every 6 (six) hours as needed. Patient not taking: Reported on 09/20/2022 07/25/21   Mesner, Barbara Cower, MD  ASPIRIN ADULT PO Take 350 mg by mouth daily. Patient not taking: Reported on 09/20/2022    [provider]  cariprazine (VRAYLAR) 1.5 MG capsule Take 1.5 mg by mouth daily.    [provider]  clonazePAM (KLONOPIN) 1 MG tablet Take 1 mg by mouth 3 (three) times daily as needed for anxiety.  05/21/19   [provider]  famotidine (PEPCID) 20 MG tablet Take 1 tablet (20 mg total) by mouth 2 (two) times daily. Patient not taking: Reported on 09/20/2022 07/16/21   Tressia Danas, MD  fluticasone  [facility] HFA) 110 MCG/ACT inhaler Inhale into the lungs. Patient not taking: Reported on 09/20/2022 03/05/20   [provider]  methocarbamol (ROBAXIN) 500 MG tablet Take 1 tablet (500 mg total) by mouth 2 (two) times daily. Patient not taking: Reported on 09/20/2022 07/25/21   Mesner, Barbara Cower, MD  ondansetron (ZOFRAN-ODT) 8 MG disintegrating tablet Take 8 mg by mouth every 8 (eight) hours as needed for nausea or vomiting.    [provider]  pantoprazole (PROTONIX) 40 MG tablet Take 1 tablet (40 mg total) by mouth 2 (two) times daily. 09/20/22   Tressia Danas, MD  PROAIR HFA 108 [id number] Base) MCG/ACT inhaler SMARTSIG:1-2 Puff(s) Via Inhaler PRN 12/31/20   [provider]  rOPINIRole (REQUIP) 3 MG tablet SMARTSIG:1 Tablet(s) By Mouth Every Evening 05/15/21   [provider]  topiramate (TOPAMAX) 100 MG tablet Take 100 mg by mouth 2 (two) times daily.    [provider]  valACYclovir (VALTREX) 500 MG tablet Take 500 mg by mouth daily.    [provider]  Vitamin D, Ergocalciferol, (DRISDOL) 1.25 MG (50000 UNIT) CAPS capsule Take 50,000 Units by mouth every 7 (seven) days. Patient not taking: Reported on 09/20/2022    [provider]      Allergies    Bee venom, Coconut flavor, Ibuprofen, Naproxen, and Tramadol    Review of Systems   Review of Systems  Physical Exam Updated Vital Signs BP (!) 128/98   Pulse 84  Temp 98 F (36.7 C)   Resp 16   SpO2 100%  Physical Exam Vitals reviewed.  Constitutional:      General: She is not in acute distress. HENT:     Head: Normocephalic and atraumatic.  Eyes:     Extraocular Movements: Extraocular movements intact.     Conjunctiva/sclera: Conjunctivae normal.     Pupils: Pupils are equal, round, and reactive to light.  Cardiovascular:     Rate and Rhythm: Normal rate and regular rhythm.     Pulses: Normal pulses.     Heart sounds: Normal heart sounds.     Comments: 2+ bilateral  radial/dorsalis pedis pulses with regular rate Pulmonary:     Effort: Pulmonary effort is normal. No respiratory distress.     Breath sounds: Normal breath sounds.  Abdominal:     Palpations: Abdomen is soft.     Tenderness: There is no abdominal tenderness. There is no guarding or rebound.  Musculoskeletal:        General: Normal range of motion.     Cervical back: Normal range of motion and neck supple.     Comments: 5 out of 5 bilateral grip/leg extension strength  Skin:    General: Skin is warm and dry.     Capillary Refill: Capillary refill takes less than 2 seconds.     Comments: Abrasions noted to left patellar region Ecchymosis noted under right eye that is slightly tender to palpation without abnormalities palpated  Neurological:     General: No focal deficit present.     Mental Status: She is alert and oriented to person, place, and time.     Sensory: Sensation is intact.     Motor: Motor function is intact.     Coordination: Coordination is intact.     Comments: Sensation intact in all 4 limbs Cranial nerves III through XII intact Vision gross intact Able to bear weight and walk however does favor the left leg  Psychiatric:        Mood and Affect: Mood normal.     ED Results / Procedures / Treatments   Labs (all labs ordered are listed, but only abnormal results are displayed) Labs Reviewed  BASIC METABOLIC PANEL - Abnormal; Notable for the following components:      Result Value   Sodium 134 (*)    CO2 19 (*)    Glucose, Bld 103 (*)    All other components within normal limits  CBC WITH DIFFERENTIAL/PLATELET  HCG, QUANTITATIVE, PREGNANCY  CBG MONITORING, ED    EKG EKG Interpretation Date/Time:  Wednesday May 04 2023 12:55:42 EDT Ventricular Rate:  100 PR Interval:  130 QRS Duration:  82 QT Interval:  342 QTC Calculation: 441 R Axis:   -22  Text Interpretation: Normal sinus rhythm Moderate voltage criteria for LVH, may be normal variant ( R in  aVL , Cornell product ) Nonspecific T wave abnormality Abnormal ECG When compared with ECG of 24-Jul-2021 23:50, No significant change since last tracing Confirmed by Richardean Canal ([id number]) on 05/04/2023 5:46:25 PM  Radiology CT Head Wo Contrast  Result Date: 05/04/2023 CLINICAL DATA:  Fall with headache.  Possible loss of consciousness. EXAM: CT HEAD WITHOUT CONTRAST TECHNIQUE: Contiguous axial images were obtained from the base of the skull through the vertex without intravenous contrast. RADIATION DOSE REDUCTION: This exam was performed according to the departmental dose-optimization program which includes automated exposure control, adjustment of the mA and/or kV according to patient size and/or use of  iterative reconstruction technique. COMPARISON:  Head CT 07/25/2021 FINDINGS: Brain: There is no evidence of an acute infarct, intracranial hemorrhage, mass, midline shift, or extra-axial fluid collection. The ventricles and sulci are unchanged and normal. Vascular: No hyperdense vessel. Skull: No acute fracture or suspicious osseous lesion. Sinuses/Orbits: Visualized paranasal sinuses are clear. Trace left mastoid fluid. Left cataract extraction. Other: None. IMPRESSION: No evidence of acute intracranial abnormality. Electronically Signed   By: Sebastian Ache M.D.   On: 05/04/2023 16:58   CT Maxillofacial WO CM  Result Date: 05/04/2023 CLINICAL DATA:  Provided history: Facial trauma, blunt. Additional history obtained from electronic MEDICAL RECORD NUMBERThe patient reports falling face first onto concrete two days ago, abrasions to right cheek. Headache. EXAM: CT MAXILLOFACIAL WITHOUT CONTRAST TECHNIQUE: Multidetector CT imaging of the maxillofacial structures was performed. Multiplanar CT image reconstructions were also generated. RADIATION DOSE REDUCTION: This exam was performed according to the departmental dose-optimization program which includes automated exposure control, adjustment of the mA and/or kV  according to patient size and/or use of iterative reconstruction technique. COMPARISON:  Maxillofacial CT 07/25/2021. FINDINGS: Osseous: No acute maxillofacial fracture is identified. Chronic fracture deformity of the right orbital floor (with 4 mm of depression), unchanged from the prior maxillofacial CT of 07/25/2021. Orbits: No acute orbital finding. Sinuses: No significant paranasal sinus disease. Soft tissues: No maxillofacial hematoma is appreciable by CT. Limited intracranial: No evidence of an acute intracranial abnormality within the field of view. IMPRESSION: 1. No evidence of an acute maxillofacial fracture. 2. Chronic fracture deformity of the right orbital floor (with 4 mm of depression). Electronically Signed   By: Jackey Loge D.O.   On: 05/04/2023 14:16   DG Knee Complete 4 Views Left  Result Date: 05/04/2023 CLINICAL DATA:  Post fall, now with left knee pain. EXAM: LEFT KNEE - COMPLETE 4+ VIEW COMPARISON:  None Available. FINDINGS: No fracture or dislocation. Left knee joint spaces are preserved. No knee joint effusion. No evidence of chondrocalcinosis. Regional soft tissues appear normal. IMPRESSION: No explanation for patient's left knee pain. Specifically, no fracture or dislocation. Electronically Signed   By: Simonne Come M.D.   On: 05/04/2023 13:40    Procedures Procedures    Medications Ordered in ED Medications  bacitracin ointment (has no administration in time range)  acetaminophen (TYLENOL) tablet 1,000 mg (1,000 mg Oral Given 05/04/23 1612)    ED Course/ Medical Decision Making/ A&P                                 Medical Decision Making Amount and/or Complexity of Data Reviewed Radiology: ordered.  Risk OTC drugs.   Ikeya L Ragen 37 y.o. presented today for fall. Working DDx that I considered at this time includes, but not limited to, vasovagal episode, mechanical fall, ICH, epidural/subdural hematoma, basilar skull fracture, anemia, electrolyte  abnormalities, drug-induced, arrhythmia, UTI, fracture, contusion, soft tissue injury.  R/o DDx: mechanical fall, ICH, epidural/subdural hematoma, basilar skull fracture, anemia, electrolyte abnormalities, drug-induced, arrhythmia, UTI, fracture, contusion, soft tissue injury: These are considered less likely due to history of present illness, physical exam, lab/imaging findings  Review of prior external notes: 01/11/2020 neurology  Unique Tests and My Interpretation:  CT head without contrast: No acute changes CT maxillofacial: Chronic right orbital floor fracture Left knee x-ray: Unremarkable CBC: Unremarkable BMP: Unremarkable EKG: Sinus 100 bpm, no ST elevations or blocks noted CBG: 97 hCG: Negative  Discussion with Independent Historian: None  Discussion of Management of Tests: None  Risk: Medium: prescription drug management  Risk Stratification Score: None  Plan: On exam patient was no acute distress with stable vitals.  Patient did have abrasions noted to her left knee However was neurovascular intact and able to bear weight but does limp slightly on the left side.  I spoke with negative x-rays from triage patient bruised her left knee is her left knee was stable on exam.  Patient's neuroexam was also reassuring the rest of her physical exam was as well.  Patient notes that she has history of small strokes as her primary care providers told her that she has had small strokes without any imaging in the past. Patient does endorse feeling warm beforehand which is suspicious of possible vasovagal.  Tylenol has been helping patient's pain and patient requested Tylenol here today and will be given this.  I had spoke patient's headache is most likely a concussion along with patient's bruise knee but will follow-up on imaging.  Imaging reassuring.  Upon chart review I did find a note from 2021 when patient went to [facility] neurology and had MRI done that did not show any abnormalities.  I  spoke to the patient about patient's MRI from 2021 as she states she has not had any follow-up with it.  Patient notes that after the Tylenol her symptoms do feel better and so I suspect patient has bruised knee along with a concussion from her fall.  I encouraged patient to follow-up with her neurologist at [facility] neurology however in the meantime to rest her brain and take Tylenol every 6 hours as needed for pain.  Patient is needle be wrapped and bacitracin ointment prior to discharge and encouraged patient to use Neosporin ointment daily as well to keep the abrasion clean.  Patient was given return precautions. Patient stable for discharge at this time.  Patient verbalized understanding of plan.         Final Clinical Impression(s) / ED Diagnoses Final diagnoses:  Fall, initial encounter  Concussion with unknown loss of consciousness status, initial encounter    Rx / DC Orders ED Discharge Orders     None         Remi Deter 05/04/23 1759    Netta Corrigan, PA-C 05/04/23 1806    Charlynne Pander, MD 05/05/23 [phone]

## 2023-05-04 NOTE — ED Triage Notes (Signed)
Pt came in via POV d/t falling face first into concrete 2 days ago, does have some abrasions  on her Rt cheek, A/Ox4, pt is concerned she had a mini stroke. Pt reports that she does not recall exactly what happened but does deny LOC & that she just fell forward onto the road & it now having Lt leg pain & intermittent HA. Rates pain 10/10 while in triage.

## 2023-05-04 NOTE — Discharge Instructions (Addendum)
Please follow-up with your neurologist or PCP regarding recent symptoms and ER visit.  Today your labs and imaging are all reassuring and you most likely had a vagal episode causing your symptoms and have bruising of your knee along with a concussion.  You may take Tylenol every 6 hours as needed for pain.  Please rest your brain over the next week or so to allow your head to heal.  Please avoid staring at screens or bright lights.  You may use Neosporin ointment daily on your knee and keep covered.  If symptoms change or worsen please return to ER.

## 2023-06-29 DIAGNOSIS — R87610 Atypical squamous cells of undetermined significance on cytologic smear of cervix (ASC-US): Secondary | ICD-10-CM | POA: Insufficient documentation

## 2023-10-20 ENCOUNTER — Ambulatory Visit: Payer: MEDICAID | Admitting: Neurology

## 2023-11-09 DIAGNOSIS — M47816 Spondylosis without myelopathy or radiculopathy, lumbar region: Secondary | ICD-10-CM | POA: Insufficient documentation

## 2023-11-09 DIAGNOSIS — M533 Sacrococcygeal disorders, not elsewhere classified: Secondary | ICD-10-CM | POA: Insufficient documentation

## 2023-12-23 ENCOUNTER — Emergency Department (HOSPITAL_COMMUNITY)
Admission: EM | Admit: 2023-12-23 | Discharge: 2023-12-23 | Discharge status: Against Medical Advice | Payer: MEDICAID | Source: Home / Self Care

## 2023-12-23 ENCOUNTER — Ambulatory Visit (HOSPITAL_COMMUNITY): Payer: MEDICAID

## 2023-12-23 ENCOUNTER — Ambulatory Visit (HOSPITAL_COMMUNITY)
Admission: EM | Admit: 2023-12-23 | Discharge: 2023-12-23 | Disposition: A | Discharge status: Home / Self Care | Payer: MEDICAID

## 2023-12-23 ENCOUNTER — Encounter (HOSPITAL_COMMUNITY): Payer: Self-pay

## 2023-12-23 DIAGNOSIS — R102 Pelvic and perineal pain: Secondary | ICD-10-CM | POA: Diagnosis not present

## 2023-12-23 DIAGNOSIS — R1032 Left lower quadrant pain: Secondary | ICD-10-CM

## 2023-12-23 DIAGNOSIS — R140 Abdominal distension (gaseous): Secondary | ICD-10-CM | POA: Diagnosis not present

## 2023-12-23 LAB — POCT URINALYSIS DIP (MANUAL ENTRY)
Bilirubin, UA: NEGATIVE
Blood, UA: NEGATIVE
Glucose, UA: NEGATIVE mg/dL
Ketones, POC UA: NEGATIVE mg/dL
Leukocytes, UA: NEGATIVE
Nitrite, UA: NEGATIVE
Protein Ur, POC: NEGATIVE mg/dL
Spec Grav, UA: 1.02
Urobilinogen, UA: 0.2 U/dL
pH, UA: 7

## 2023-12-23 LAB — POCT URINE PREGNANCY: Preg Test, Ur: NEGATIVE

## 2023-12-23 MED ORDER — POLYETHYLENE GLYCOL 3350 17 GM/SCOOP PO POWD: 17.0000 g | Freq: Two times a day (BID) | ORAL | 0 refills | Status: AC | PRN

## 2023-12-23 MED ORDER — DICYCLOMINE HCL 10 MG PO CAPS: 10.0000 mg | ORAL_CAPSULE | Freq: Three times a day (TID) | ORAL | 0 refills | Status: DC | PRN

## 2023-12-23 NOTE — ED Provider Notes (Signed)
 [facility]    CSN: [id number] Arrival date & time: 12/23/23  1615      History   Chief Complaint Chief Complaint  Patient presents with   Abdominal Pain    HPI [name] is a 38 y.o. female.   Patient [name] with lower abdominal pain in the left lower quadrant and suprapubic area.  She has had pain since 11/29/23.  She is having monthly menstrual cycles and her last menstrual cycle started on 11/25/2023 or 11/29/2023, she is not 100% sure.  She denies any specific menstrual problems.  The pains are sharp and severe and have been persisting for the last 24 to 48 hours.  She denies nausea, vomiting, diarrhea, constipation.  She reports she had a medium bowel movement this morning it was formed but not hard.  She denies any recent dietary changes.  She has used Tylenol , and heating pad and neither one was very helpful for the pain.   Abdominal Pain Associated symptoms: no chest pain, no chills, no constipation, no cough, no diarrhea, no dysuria, no fever, no hematuria, no nausea, no shortness of breath, no sore throat and no vomiting     Past Medical History:  Diagnosis Date   Anxiety    Asthma    Bipolar 1 disorder (HCC)    Bipolar 1 disorder (HCC)    Cataract    left eye   Depression    Endometriosis    GERD (gastroesophageal reflux disease)    Mental disorder    Migraine     Patient Active Problem List   Diagnosis Date Noted   Hiatal hernia 10/10/2019   Bipolar 1 disorder, depressed, severe (HCC) 02/22/2019   Severe recurrent major depression with psychotic features (HCC) 07/23/2017   Substance use disorder 07/23/2017   Pelvic pain in female 01/15/2015   MDD (major depressive disorder), recurrent episode, severe (HCC) 03/11/2014   MDD (major depressive disorder) 03/11/2014   Bipolar disorder with depression (HCC) 09/14/2013   Major depressive disorder, recurrent episode, severe (HCC) 12/30/2011    Class: Stage 3   Bipolar affective disorder,  depressed, severe (HCC) 12/30/2011    Class: Acute    Past Surgical History:  Procedure Laterality Date   ABDOMINAL SURGERY     "For endometriosis"   CHOLECYSTECTOMY     HERNIA REPAIR     OOPHORECTOMY Right 08/30/2010   TUBAL LIGATION      OB History     Gravida  4   Para  2   Term  2   Preterm  0   AB  2   Living  3      SAB  2   IAB  0   Ectopic  0   Multiple  0   Live Births  1            Home Medications    Prior to Admission medications   Medication Sig Start Date End Date Taking? Authorizing Provider  acetaminophen  (TYLENOL ) 325 MG tablet Take 2 tablets (650 mg total) by mouth every 6 (six) hours as needed. 07/25/21  Yes Mesner, Reymundo Caulk, MD  amphetamine-dextroamphetamine (ADDERALL) 10 MG tablet Take 10 mg by mouth. 09/22/23  Yes [provider]  beclomethasone (QVAR) 40 MCG/ACT inhaler Inhale 2 puffs into the lungs. 10/24/23  Yes [provider]  Cariprazine  HCl 4.5 MG CAPS Take 4.5 mg by mouth daily. 09/22/23  Yes [provider]  clonazePAM  (KLONOPIN ) 1 MG tablet Take 1 mg by mouth  3 (three) times daily as needed for anxiety.  05/21/19  Yes [provider]  dicyclomine  (BENTYL ) 10 MG capsule Take 1 capsule (10 mg total) by mouth 3 (three) times daily with meals as needed for up to 15 days for spasms. 12/23/23 01/07/24 Yes Guss Legacy, FNP  famotidine  (PEPCID ) 20 MG tablet Take 1 tablet (20 mg total) by mouth 2 (two) times daily. 07/16/21  Yes Lindle Rhea, MD  pantoprazole  (PROTONIX ) 40 MG tablet Take 1 tablet (40 mg total) by mouth 2 (two) times daily. 09/20/22  Yes Lindle Rhea, MD  polyethylene glycol powder (GLYCOLAX /MIRALAX ) 17 GM/SCOOP powder Take 17 g by mouth 2 (two) times daily as needed for moderate constipation. 12/23/23 01/22/24 Yes Guss Legacy, FNP  primidone  (MYSOLINE ) 50 MG tablet Take 100 mg by mouth. 10/03/23  Yes [provider]  PROAIR  HFA 108 (90 Base) MCG/ACT inhaler SMARTSIG:1-2  Puff(s) Via Inhaler PRN 12/31/20  Yes [provider]  rOPINIRole  (REQUIP ) 3 MG tablet SMARTSIG:1 Tablet(s) By Mouth Every Evening 05/15/21  Yes [provider]  topiramate  (TOPAMAX ) 100 MG tablet Take 100 mg by mouth 2 (two) times daily.   Yes [provider]  traZODone  (DESYREL ) 100 MG tablet Take 50-150 mg by mouth. 09/22/23  Yes [provider]  valACYclovir  (VALTREX ) 500 MG tablet Take 500 mg by mouth daily.   Yes [provider]    Family History Family History  Problem Relation Age of Onset   Stroke Mother    Hyperlipidemia Other    Colon cancer Neg Hx    Stomach cancer Neg Hx    Pancreatic cancer Neg Hx    Esophageal cancer Neg Hx     Social History Social History   Tobacco Use   Smoking status: Every Day    Current packs/day: 0.25    Average packs/day: 0.3 packs/day for 5.0 years (1.3 ttl pk-yrs)    Types: Cigarettes   Smokeless tobacco: Never  Vaping Use   Vaping status: Every Day   Substances: Nicotine , Flavoring  Substance Use Topics   Alcohol use: Never   Drug use: Yes    Types: Marijuana    Comment: last use 11 oct 22     Allergies   Bee venom, Coconut flavoring agent (non-screening), Ibuprofen , Naproxen , and Tramadol    Review of Systems Review of Systems  Constitutional:  Negative for chills and fever.  HENT:  Negative for ear pain and sore throat.   Eyes:  Negative for pain and visual disturbance.  Respiratory:  Negative for cough and shortness of breath.   Cardiovascular:  Negative for chest pain and palpitations.  Gastrointestinal:  Positive for abdominal pain. Negative for constipation, diarrhea, nausea and vomiting.  Genitourinary:  Negative for dysuria and hematuria.  Musculoskeletal:  Negative for arthralgias and back pain.  Skin:  Negative for color change and rash.  Neurological:  Negative for seizures and syncope.  All other systems reviewed and are negative.    Physical Exam Triage Vital  Signs ED Triage Vitals  Encounter Vitals Group     BP 12/23/23 1728 114/81     Systolic BP Percentile --      Diastolic BP Percentile --      Pulse Rate 12/23/23 1728 85     Resp 12/23/23 1728 18     Temp 12/23/23 1728 98 F (36.7 C)     Temp Source 12/23/23 1728 Oral     SpO2 12/23/23 1728 97 %     Weight --  Height --      Head Circumference --      Peak Flow --      Pain Score 12/23/23 1723 10     Pain Loc --      Pain Education --      Exclude from Growth Chart --    No data found.  Updated Vital Signs BP 114/81 (BP Location: Left Arm)   Pulse 85   Temp 98 F (36.7 C) (Oral)   Resp 18   LMP 11/29/2023 (Approximate)   SpO2 97%   Visual Acuity Right Eye Distance:   Left Eye Distance:   Bilateral Distance:    Right Eye Near:   Left Eye Near:    Bilateral Near:     Physical Exam Vitals and nursing note reviewed.  Constitutional:      General: She is not in acute distress.    Appearance: She is well-developed. She is morbidly obese. She is not ill-appearing or toxic-appearing.  HENT:     Head: Normocephalic and atraumatic.     Right Ear: Hearing, tympanic membrane, ear canal and external ear normal.     Left Ear: Hearing, tympanic membrane, ear canal and external ear normal.     Nose: No congestion or rhinorrhea.     Right Sinus: No maxillary sinus tenderness or frontal sinus tenderness.     Left Sinus: No maxillary sinus tenderness or frontal sinus tenderness.     Mouth/Throat:     Lips: Pink.     Mouth: Mucous membranes are moist.     Pharynx: Uvula midline. No oropharyngeal exudate or posterior oropharyngeal erythema.     Tonsils: No tonsillar exudate.  Eyes:     Conjunctiva/sclera: Conjunctivae normal.     Pupils: Pupils are equal, round, and reactive to light.  Cardiovascular:     Rate and Rhythm: Normal rate and regular rhythm.     Heart sounds: Normal heart sounds, S1 normal and S2 normal. No murmur heard. Pulmonary:     Effort: Pulmonary  effort is normal. No respiratory distress.     Breath sounds: Normal breath sounds. No decreased breath sounds, wheezing, rhonchi or rales.  Abdominal:     General: Bowel sounds are normal.     Palpations: Abdomen is soft.     Tenderness: There is abdominal tenderness (Moderate to severe.) in the suprapubic area and left lower quadrant. There is no right CVA tenderness, left CVA tenderness, guarding or rebound. Negative signs include Murphy's sign, Rovsing's sign and McBurney's sign.  Musculoskeletal:        General: No swelling.     Cervical back: Neck supple.  Lymphadenopathy:     Head:     Right side of head: No submental, submandibular, tonsillar, preauricular or posterior auricular adenopathy.     Left side of head: No submental, submandibular, tonsillar, preauricular or posterior auricular adenopathy.     Cervical: No cervical adenopathy.     Right cervical: No superficial cervical adenopathy.    Left cervical: No superficial cervical adenopathy.  Skin:    General: Skin is warm and dry.     Capillary Refill: Capillary refill takes less than 2 seconds.     Findings: No rash.  Neurological:     Mental Status: She is alert and oriented to person, place, and time.  Psychiatric:        Mood and Affect: Mood normal.      UC Treatments / Results  Labs (all labs ordered are listed, but  only abnormal results are displayed) Labs Reviewed  POCT URINALYSIS DIP (MANUAL ENTRY) - Abnormal; Notable for the following components:      Result Value   Color, UA straw (*)    Clarity, UA hazy (*)    All other components within normal limits  POCT URINE PREGNANCY - Normal    EKG   Radiology No results found.  Procedures Procedures (including critical care time)  Medications Ordered in UC Medications - No data to display  Initial Impression / Assessment and Plan / UC Course  I have reviewed the triage vital signs and the nursing notes.  Pertinent labs & imaging results that were  available during my care of the patient were reviewed by me and considered in my medical decision making (see chart for details).  Clinical Course as of 12/23/23 1904  Fri Dec 23, 2023  1838 DG Abd 2 Views [SJ]    Clinical Course User Index [SJ] Guss Legacy, FNP    Plan of Care: Abdominal and suprapubic pain with abdominal bloating.  KUB shows a moderate stool burden with lots of bowel gas.  Urinalysis was essentially normal and urine pregnancy test was negative.  Encouraged to get empty of stool and use MiraLAX , 1 capful twice daily for 3 to 4 days.  Encouraged to use clear liquids for 3 to 4 days.  May use dicyclomine  10 mg every 8 hours if needed for abdominal pain.  Follow-up if symptoms do not improve, worsen or new symptoms occur. Final Clinical Impressions(s) / UC Diagnoses   Final diagnoses:  Left lower quadrant abdominal pain  Suprapubic abdominal pain  Abdominal bloating     Discharge Instructions      Urinalysis was essentially normal.  Urine pregnancy test was negative.  Abdominal x-ray shows mild to moderate amount of stool in the colon and quite a bit of gas.    I believe her abdominal pain is related to her bowels.  Encouraged to use MiraLAX , 1 capful up to twice daily for 2 to 5 days to try to get empty of stool.  Get plenty of fluids and rest.  May use Bentyl  (dicyclomine ), 10 mg, every 12 hours as needed for abdominal pain.  Follow-up here if symptoms do not improve, worsen or new symptoms occur.     ED Prescriptions     Medication Sig Dispense Auth. Provider   dicyclomine  (BENTYL ) 10 MG capsule Take 1 capsule (10 mg total) by mouth 3 (three) times daily with meals as needed for up to 15 days for spasms. 45 capsule Guss Legacy, FNP   polyethylene glycol powder (GLYCOLAX /MIRALAX ) 17 GM/SCOOP powder Take 17 g by mouth 2 (two) times daily as needed for moderate constipation. 510 g Guss Legacy, FNP      I have reviewed the PDMP during this encounter.    Guss Legacy, FNP 12/23/23 1904

## 2023-12-23 NOTE — Progress Notes (Signed)
 No change in the plan of care.  Patient was given these results during her visit.

## 2023-12-23 NOTE — Discharge Instructions (Addendum)
 Urinalysis was essentially normal.  Urine pregnancy test was negative.  Abdominal x-ray shows mild to moderate amount of stool in the colon and quite a bit of gas.    I believe her abdominal pain is related to her bowels.  Encouraged to use MiraLAX , 1 capful up to twice daily for 2 to 5 days to try to get empty of stool.  Get plenty of fluids and rest.  May use Bentyl  (dicyclomine ), 10 mg, every 12 hours as needed for abdominal pain.  Follow-up here if symptoms do not improve, worsen or new symptoms occur.

## 2023-12-23 NOTE — ED Triage Notes (Signed)
 Patient presenting with lower abdominal pain and left flank pain onset 1 month ago. States the pains are sharp and severe.  Denies NVD and constipation. No dietary or medication changes.  Prescriptions or OTC medications tried: Yes- tylenol , heating pad    with no relief.

## 2023-12-25 ENCOUNTER — Other Ambulatory Visit: Payer: Self-pay

## 2023-12-25 ENCOUNTER — Emergency Department (HOSPITAL_COMMUNITY): Payer: MEDICAID

## 2023-12-25 ENCOUNTER — Encounter (HOSPITAL_COMMUNITY): Payer: Self-pay

## 2023-12-25 ENCOUNTER — Emergency Department (HOSPITAL_COMMUNITY)
Admission: EM | Admit: 2023-12-25 | Discharge: 2023-12-26 | Disposition: A | Discharge status: Home / Self Care | Payer: MEDICAID | Attending: Emergency Medicine | Admitting: Emergency Medicine

## 2023-12-25 DIAGNOSIS — R1032 Left lower quadrant pain: Secondary | ICD-10-CM | POA: Diagnosis not present

## 2023-12-25 DIAGNOSIS — R197 Diarrhea, unspecified: Secondary | ICD-10-CM | POA: Insufficient documentation

## 2023-12-25 DIAGNOSIS — K5900 Constipation, unspecified: Secondary | ICD-10-CM | POA: Insufficient documentation

## 2023-12-25 DIAGNOSIS — R109 Unspecified abdominal pain: Secondary | ICD-10-CM

## 2023-12-25 LAB — COMPREHENSIVE METABOLIC PANEL WITH GFR
ALT: 16 U/L (ref 0–44)
AST: 13 U/L — ABNORMAL LOW (ref 15–41)
Albumin: 3.6 g/dL (ref 3.5–5.0)
Alkaline Phosphatase: 68 U/L (ref 38–126)
Anion gap: 6 (ref 5–15)
BUN: 10 mg/dL (ref 6–20)
CO2: 21 mmol/L — ABNORMAL LOW (ref 22–32)
Calcium: 8.7 mg/dL — ABNORMAL LOW (ref 8.9–10.3)
Chloride: 111 mmol/L (ref 98–111)
Creatinine, Ser: 0.78 mg/dL (ref 0.44–1.00)
GFR, Estimated: >60 mL/min (ref >60)
Glucose, Bld: 96 mg/dL (ref 70–99)
Potassium: 4.1 mmol/L (ref 3.5–5.1)
Sodium: 138 mmol/L (ref 135–145)
Total Bilirubin: 0.3 mg/dL (ref 0.0–1.2)
Total Protein: 7.1 g/dL (ref 6.5–8.1)

## 2023-12-25 LAB — PREGNANCY, URINE: Preg Test, Ur: NEGATIVE

## 2023-12-25 LAB — CBC WITH DIFFERENTIAL/PLATELET
Abs Immature Granulocytes: 0.01 10*3/uL (ref 0.00–0.07)
Basophils Absolute: 0 10*3/uL (ref 0.0–0.1)
Basophils Relative: 0 %
Eosinophils Absolute: 0.1 10*3/uL (ref 0.0–0.5)
Eosinophils Relative: 2 %
HCT: 40.9 % (ref 36.0–46.0)
Hemoglobin: 14 g/dL (ref 12.0–15.0)
Immature Granulocytes: 0 %
Lymphocytes Relative: 41 %
Lymphs Abs: 3.1 10*3/uL (ref 0.7–4.0)
MCH: 33.1 pg (ref 26.0–34.0)
MCHC: 34.2 g/dL (ref 30.0–36.0)
MCV: 96.7 fL (ref 80.0–100.0)
Monocytes Absolute: 0.6 10*3/uL (ref 0.1–1.0)
Monocytes Relative: 8 %
Neutro Abs: 3.8 10*3/uL (ref 1.7–7.7)
Neutrophils Relative %: 49 %
Platelets: 250 10*3/uL (ref 150–400)
RBC: 4.23 MIL/uL (ref 3.87–5.11)
RDW: 13.9 % (ref 11.5–15.5)
WBC: 7.7 10*3/uL (ref 4.0–10.5)
nRBC: 0 % (ref 0.0–0.2)

## 2023-12-25 LAB — URINALYSIS, ROUTINE W REFLEX MICROSCOPIC
Bilirubin Urine: NEGATIVE
Glucose, UA: NEGATIVE mg/dL
Hgb urine dipstick: NEGATIVE
Ketones, ur: NEGATIVE mg/dL
Leukocytes,Ua: NEGATIVE
Nitrite: NEGATIVE
Protein, ur: NEGATIVE mg/dL
Specific Gravity, Urine: 1.005 (ref 1.005–1.030)
pH: 6 (ref 5.0–8.0)

## 2023-12-25 LAB — LIPASE, BLOOD: Lipase: 27 U/L (ref 11–51)

## 2023-12-25 MED ORDER — IOHEXOL 300 MG/ML  SOLN
100.0000 mL | Freq: Once | INTRAMUSCULAR | Status: AC | PRN
  Administered 2023-12-25: 100 mL via INTRAVENOUS

## 2023-12-25 MED ORDER — FENTANYL CITRATE PF 50 MCG/ML IJ SOSY
50.0000 ug | PREFILLED_SYRINGE | Freq: Once | INTRAMUSCULAR | Status: AC
  Administered 2023-12-25: 50 ug via INTRAVENOUS
  Filled 2023-12-25: qty 1

## 2023-12-25 NOTE — ED Provider Notes (Signed)
 [place] EMERGENCY DEPARTMENT AT [facility] Provider Note   CSN: [id number] Arrival date & time: 12/25/23  2012     History {Add pertinent medical, surgical, social history, OB history to HPI:1} Chief Complaint  Patient presents with   Abdominal Pain    [name] is a 38 y.o. female.  Supra pubic pain since 11/29/23 and sharp LLQ abd pain started Friday No nausea, vomiting, fevers. No vaginal symptoms. No urinary symptoms. Watery diarrhea for past two days since starting miralax  on Friday Has had r ovary and gallbladder removed, hernia removed Tried bentyl , tylneol  wo relief   Abdominal Pain      Home Medications Prior to Admission medications   Medication Sig Start Date End Date Taking? Authorizing Provider  acetaminophen  (TYLENOL ) 325 MG tablet Take 2 tablets (650 mg total) by mouth every 6 (six) hours as needed. 07/25/21   Mesner, Reymundo Caulk, MD  amphetamine-dextroamphetamine (ADDERALL) 10 MG tablet Take 10 mg by mouth. 09/22/23   [provider]  beclomethasone (QVAR) 40 MCG/ACT inhaler Inhale 2 puffs into the lungs. 10/24/23   [provider]  Cariprazine  HCl 4.5 MG CAPS Take 4.5 mg by mouth daily. 09/22/23   [provider]  clonazePAM  (KLONOPIN ) 1 MG tablet Take 1 mg by mouth 3 (three) times daily as needed for anxiety.  05/21/19   [provider]  dicyclomine  (BENTYL ) 10 MG capsule Take 1 capsule (10 mg total) by mouth 3 (three) times daily with meals as needed for up to 15 days for spasms. 12/23/23 01/07/24  Guss Legacy, FNP  famotidine  (PEPCID ) 20 MG tablet Take 1 tablet (20 mg total) by mouth 2 (two) times daily. 07/16/21   Lindle Rhea, MD  pantoprazole  (PROTONIX ) 40 MG tablet Take 1 tablet (40 mg total) by mouth 2 (two) times daily. 09/20/22   Lindle Rhea, MD  polyethylene glycol powder (GLYCOLAX /MIRALAX ) 17 GM/SCOOP powder Take 17 g by mouth 2 (two) times daily as needed for moderate constipation. 12/23/23  01/22/24  Guss Legacy, FNP  primidone  (MYSOLINE ) 50 MG tablet Take 100 mg by mouth. 10/03/23   [provider]  PROAIR  HFA 108 (90 Base) MCG/ACT inhaler SMARTSIG:1-2 Puff(s) Via Inhaler PRN 12/31/20   [provider]  rOPINIRole  (REQUIP ) 3 MG tablet SMARTSIG:1 Tablet(s) By Mouth Every Evening 05/15/21   [provider]  topiramate  (TOPAMAX ) 100 MG tablet Take 100 mg by mouth 2 (two) times daily.    [provider]  traZODone  (DESYREL ) 100 MG tablet Take 50-150 mg by mouth. 09/22/23   [provider]  valACYclovir  (VALTREX ) 500 MG tablet Take 500 mg by mouth daily.    [provider]      Allergies    Bee venom, Coconut flavoring agent (non-screening), Ibuprofen , Naproxen , and Tramadol     Review of Systems   Review of Systems  Gastrointestinal:  Positive for abdominal pain.    Physical Exam Updated Vital Signs BP (!) 119/45 (BP Location: Right Arm)   Pulse 99   Temp 98.4 F (36.9 C) (Oral)   Resp 17   LMP 11/29/2023 (Approximate)   SpO2 99%  Physical Exam  ED Results / Procedures / Treatments   Labs (all labs ordered are listed, but only abnormal results are displayed) Labs Reviewed  COMPREHENSIVE METABOLIC PANEL WITH GFR  LIPASE, BLOOD  CBC WITH DIFFERENTIAL/PLATELET  URINALYSIS, ROUTINE W REFLEX MICROSCOPIC  HCG, SERUM, QUALITATIVE    EKG None  Radiology No results found.  Procedures Procedures  {Document  cardiac monitor, telemetry assessment procedure when appropriate:1}  Medications Ordered in ED Medications - No data to display  ED Course/ Medical Decision Making/ A&P   {   Click here for ABCD2, HEART and other calculatorsREFRESH Note before signing :1}                              Medical Decision Making Amount and/or Complexity of Data Reviewed Labs: ordered.   ***  {Document critical care time when appropriate:1} {Document review of labs and clinical decision tools ie heart score, Chads2Vasc2  etc:1}  {Document your independent review of radiology images, and any outside records:1} {Document your discussion with family members, caretakers, and with consultants:1} {Document social determinants of health affecting pt's care:1} {Document your decision making why or why not admission, treatments were needed:1} Final Clinical Impression(s) / ED Diagnoses Final diagnoses:  None    Rx / DC Orders ED Discharge Orders     None

## 2023-12-25 NOTE — ED Triage Notes (Signed)
 Pt BIB EMS from home for increase LLQ abdominal pain since Friday. Pt went to UC on Friday for moderate build up for stool and gas in her colon She was given Miralax  with no relief.

## 2023-12-25 NOTE — ED Provider Notes (Incomplete)
 [place] EMERGENCY DEPARTMENT AT [facility] Provider Note   CSN: [id number] Arrival date & time: 12/25/23  2012     History {Add pertinent medical, surgical, social history, OB history to HPI:1} Chief Complaint  Patient presents with  . Abdominal Pain    [name] is a 38 y.o. female with past medical history of MDD, bipolar affective disorder presents to emergency department for evaluation of lower abdominal pain that started on 11/29/2023.  She originally saw evaluation at urgent care on 12/23/2023 as she started having sharp LLQ abdominal pain.  She had an x-ray there that was consistent with constipation.  They recommended MiraLAX .  Since then, she has had watery diarrhea for the past 2 days and does not report alleviation of symptoms.  She has tried Bentyl , Tylenol , MiraLAX  without relief.  She denies nausea, vomiting, fevers, vaginal symptoms, and urinary symptoms.   Abdominal Pain     Home Medications Prior to Admission medications   Medication Sig Start Date End Date Taking? Authorizing Provider  acetaminophen  (TYLENOL ) 325 MG tablet Take 2 tablets (650 mg total) by mouth every 6 (six) hours as needed. 07/25/21   Mesner, Reymundo Caulk, MD  amphetamine-dextroamphetamine (ADDERALL) 10 MG tablet Take 10 mg by mouth. 09/22/23   [provider]  beclomethasone (QVAR) 40 MCG/ACT inhaler Inhale 2 puffs into the lungs. 10/24/23   [provider]  Cariprazine  HCl 4.5 MG CAPS Take 4.5 mg by mouth daily. 09/22/23   [provider]  clonazePAM  (KLONOPIN ) 1 MG tablet Take 1 mg by mouth 3 (three) times daily as needed for anxiety.  05/21/19   [provider]  dicyclomine  (BENTYL ) 10 MG capsule Take 1 capsule (10 mg total) by mouth 3 (three) times daily with meals as needed for up to 15 days for spasms. 12/23/23 01/07/24  Guss Legacy, FNP  famotidine  (PEPCID ) 20 MG tablet Take 1 tablet (20 mg total) by mouth 2 (two) times daily. 07/16/21   Lindle Rhea, MD  pantoprazole  (PROTONIX ) 40 MG tablet Take 1 tablet (40 mg total) by mouth 2 (two) times daily. 09/20/22   Lindle Rhea, MD  polyethylene glycol powder (GLYCOLAX /MIRALAX ) 17 GM/SCOOP powder Take 17 g by mouth 2 (two) times daily as needed for moderate constipation. 12/23/23 01/22/24  Guss Legacy, FNP  primidone  (MYSOLINE ) 50 MG tablet Take 100 mg by mouth. 10/03/23   [provider]  PROAIR  HFA 108 (90 Base) MCG/ACT inhaler SMARTSIG:1-2 Puff(s) Via Inhaler PRN 12/31/20   [provider]  rOPINIRole  (REQUIP ) 3 MG tablet SMARTSIG:1 Tablet(s) By Mouth Every Evening 05/15/21   [provider]  topiramate  (TOPAMAX ) 100 MG tablet Take 100 mg by mouth 2 (two) times daily.    [provider]  traZODone  (DESYREL ) 100 MG tablet Take 50-150 mg by mouth. 09/22/23   [provider]  valACYclovir  (VALTREX ) 500 MG tablet Take 500 mg by mouth daily.    [provider]      Allergies    Bee venom, Coconut flavoring agent (non-screening), Ibuprofen , Naproxen , and Tramadol     Review of Systems   Review of Systems  Gastrointestinal:  Positive for abdominal pain.    Physical Exam Updated Vital Signs BP (!) 119/45 (BP Location: Right Arm)   Pulse 99   Temp 98.4 F (36.9 C) (Oral)   Resp 17   LMP 11/29/2023 (Approximate)   SpO2 99%  Physical Exam Vitals and nursing note reviewed.  Constitutional:      General: She is  not in acute distress.    Appearance: Normal appearance.  HENT:     Head: Normocephalic and atraumatic.  Eyes:     Conjunctiva/sclera: Conjunctivae normal.  Cardiovascular:     Rate and Rhythm: Normal rate.  Pulmonary:     Effort: Pulmonary effort is normal. No respiratory distress.     Breath sounds: Normal breath sounds.  Chest:     Chest wall: No tenderness.  Abdominal:     General: Bowel sounds are normal.     Palpations: Abdomen is soft.     Tenderness: There is abdominal tenderness in the right lower quadrant,  suprapubic area and left lower quadrant. There is guarding. There is no right CVA tenderness, left CVA tenderness or rebound.  Musculoskeletal:     Right lower leg: No edema.     Left lower leg: No edema.  Skin:    Coloration: Skin is not jaundiced or pale.  Neurological:     Mental Status: She is alert and oriented to person, place, and time. Mental status is at baseline.     ED Results / Procedures / Treatments   Labs (all labs ordered are listed, but only abnormal results are displayed) Labs Reviewed  COMPREHENSIVE METABOLIC PANEL WITH GFR  LIPASE, BLOOD  CBC WITH DIFFERENTIAL/PLATELET  URINALYSIS, ROUTINE W REFLEX MICROSCOPIC  HCG, SERUM, QUALITATIVE    EKG None  Radiology No results found.  Procedures Procedures  {Document cardiac monitor, telemetry assessment procedure when appropriate:1}  Medications Ordered in ED Medications - No data to display  ED Course/ Medical Decision Making/ A&P   {   Click here for ABCD2, HEART and other calculatorsREFRESH Note before signing :1}                              Medical Decision Making Amount and/or Complexity of Data Reviewed Labs: ordered. Radiology: ordered.  Risk Prescription drug management.     Patient presents to the ED for concern of ***, this involves an extensive number of treatment options, and is a complaint that carries with it a high risk of complications and morbidity.  The differential diagnosis includes ***   Co morbidities that complicate the patient evaluation  ***   Additional history obtained:  Additional history obtained from *** Nursing and Outside Medical Records   External records from outside source obtained and reviewed including triage RN note, UC note and imaging   Lab Tests:  I Ordered, and personally interpreted labs.  The pertinent results include:   Calcium 8.7 UA without infection hCG negative    Imaging Studies ordered:  I ordered imaging studies including CT  abd pelvis  I independently visualized and interpreted imaging which showed no acute intraabdominal findings I agree with the radiologist interpretation     Medicines ordered and prescription drug management:  I ordered medication including fentanyl   for abd pain  Reevaluation of the patient after these medicines showed that the patient improved I have reviewed the patients home medicines and have made adjustments as needed    Problem List / ED Course:  Lower abd pain X-ray urgent care showed nonobstructive bowel gas pattern with low stool burden.  She attempted to take MiraLAX  for the past 2 days without relief    Reevaluation:  After the interventions noted above, I reevaluated the patient and found that they have :stayed the same     Dispostion:  After consideration of the diagnostic results and  the patients response to treatment, I feel that the patent would benefit from ***.    {Document critical care time when appropriate:1} {Document review of labs and clinical decision tools ie heart score, Chads2Vasc2 etc:1}  {Document your independent review of radiology images, and any outside records:1} {Document your discussion with family members, caretakers, and with consultants:1} {Document social determinants of health affecting pt's care:1} {Document your decision making why or why not admission, treatments were needed:1} Final Clinical Impression(s) / ED Diagnoses Final diagnoses:  None    Rx / DC Orders ED Discharge Orders     None

## 2023-12-26 NOTE — ED Provider Notes (Signed)
  Physical Exam  BP (!) 119/45 (BP Location: Right Arm)   Pulse 99   Temp 98.4 F (36.9 C) (Oral)   Resp 17   LMP 11/29/2023 (Approximate)   SpO2 99%   Physical Exam  Procedures  Procedures  ED Course / MDM    Medical Decision Making Amount and/or Complexity of Data Reviewed Labs: ordered. Radiology: ordered.  Risk Prescription drug management.   Patient care assumed at shift handoff from previous provider.  Please see her note for full details.  In short, 38 year old female present to the emergency room complaining of lower abdominal pain that initially began in early April.  She was seen a few days ago at an urgent care and treated for constipation.  She has also taken Bentyl  without relief.  She denies nausea, vomiting, fever, vaginal symptoms, urinary symptoms.  At the time of shift handoff CT scan had been completed with no acute findings in the abdomen or pelvis.  Disposition pending results of pelvic ultrasound.  Pelvic ultrasound was unremarkable with no sign of torsion or other acute findings.  At this time the patient is able to tolerate oral intake, the underlying cause of her abdominal pain is unclear but I see no sign of any life-threatening etiology at this time and she appears stable for discharge home.  Patient has a planned follow-up appointment with primary care this week.  Discharged home with return precautions.       Elisa Guest, PA-C 12/26/23 0053    Ballard Bongo, MD 12/26/23 [phone]

## 2023-12-26 NOTE — Discharge Instructions (Addendum)
 Your workup this evening was reassuring with no acute findings on imaging.  Please keep your follow-up appointment with your primary care provider this week for further evaluation.  If you develop any life-threatening symptoms please return to the emergency department.

## 2024-01-24 NOTE — Progress Notes (Unsigned)
 Brigitte Canard, PA-C [address] [place], [place]  [address] Phone: [phone]   Primary Care Physician: Patient, No Pcp Per  Primary Gastroenterologist:  Brigitte Canard, PA-C / ***  Chief Complaint:  Chronic Abdominal Pain       HPI:   [name] is a 38 y.o. female, previous patient of Dr. Savannah Curlin, presents to evaluate abdominal pain.  She has history of GERD with esophagitis, IBS-C, and chronic abdominal pain.  Patient went to [facility], ED 12/25/23 to evaluate LLQ abdominal pain.  Her abdominal pain initially began in early April.  Located in the left lower abdomen.  She tried Bentyl  with little relief.  Also tried treatment for constipation with little benefit.  She has not had any nausea, vomiting, fever, or urinary symptoms.  12/25/2023 Pelvic ultrasound unremarkable.  12/25/2023 abdominal pelvic CT with contrast: No acute abnormality.  12/23/2023 abdominal x-ray 2 view: No bowel obstruction.  Low stool burden.  12/25/23 labs: Urine pregnancy test negative.  Normal CBC, CMP, and lipase.  Negative UA.  She last saw Dr. Savannah Curlin in our office 08/2022 for follow-up of GERD with esophagitis.  Treated with pantoprazole  40 Mg twice daily and dicyclomine  as needed.  EGD 05/2021:  LA grade a reflux esophagitis, gastritis, and gastric erosions.  Biopsies negative for H. Pylori, celiac and EOE.  07/2019 EGD:  Normal esophagus, a medium sized hiatal hernia, gastric erosions in the body and antrum, and a 3mm gastric antral ulcer. Gastric biopsies showed reactive gastropathy. There was no H pylori.   Previous cholecystectomy around 2011.  Current Outpatient Medications  Medication Sig Dispense Refill   acetaminophen  (TYLENOL ) 325 MG tablet Take 2 tablets (650 mg total) by mouth every 6 (six) hours as needed. 30 tablet 0   amphetamine-dextroamphetamine (ADDERALL) 10 MG tablet Take 10 mg by mouth.     beclomethasone (QVAR) 40 MCG/ACT inhaler Inhale 2 puffs into the lungs.      Cariprazine  HCl 4.5 MG CAPS Take 4.5 mg by mouth daily.     clonazePAM  (KLONOPIN ) 1 MG tablet Take 1 mg by mouth 3 (three) times daily as needed for anxiety.      dicyclomine  (BENTYL ) 10 MG capsule Take 1 capsule (10 mg total) by mouth 3 (three) times daily with meals as needed for up to 15 days for spasms. 45 capsule 0   famotidine  (PEPCID ) 20 MG tablet Take 1 tablet (20 mg total) by mouth 2 (two) times daily. 60 tablet 3   pantoprazole  (PROTONIX ) 40 MG tablet Take 1 tablet (40 mg total) by mouth 2 (two) times daily. 180 tablet 3   primidone  (MYSOLINE ) 50 MG tablet Take 100 mg by mouth.     PROAIR  HFA 108 (90 Base) MCG/ACT inhaler SMARTSIG:1-2 Puff(s) Via Inhaler PRN     rOPINIRole  (REQUIP ) 3 MG tablet SMARTSIG:1 Tablet(s) By Mouth Every Evening     topiramate  (TOPAMAX ) 100 MG tablet Take 100 mg by mouth 2 (two) times daily.     traZODone  (DESYREL ) 100 MG tablet Take 50-150 mg by mouth.     valACYclovir  (VALTREX ) 500 MG tablet Take 500 mg by mouth daily.     No current facility-administered medications for this visit.   Facility-Administered Medications Ordered in Other Visits  Medication Dose Route Frequency Provider Last Rate Last Admin   sodium chloride  flush (NS) 0.9 % injection 20 mL  20 mL Intravenous PRN Ahern, [name] B, MD   20 mL at 02/11/20 1301  Allergies as of 01/25/2024 - Review Complete 12/25/2023  Allergen Reaction Noted   Bee venom Anaphylaxis and Swelling 01/15/2015   Coconut flavoring agent (non-screening) Anaphylaxis 09/23/2014   Ibuprofen  Hives 07/22/2017   Naproxen  Nausea And Vomiting 05/03/2017   Tramadol  Other (See Comments) 11/20/2019    Past Medical History:  Diagnosis Date   Anxiety    Asthma    Bipolar 1 disorder (HCC)    Bipolar 1 disorder (HCC)    Cataract    left eye   Depression    Endometriosis    GERD (gastroesophageal reflux disease)    Mental disorder    Migraine     Past Surgical History:  Procedure Laterality Date   ABDOMINAL  SURGERY     "For endometriosis"   CHOLECYSTECTOMY     HERNIA REPAIR     OOPHORECTOMY Right 08/30/2010   TUBAL LIGATION      Review of Systems:    All systems reviewed and negative except where noted in HPI.    Physical Exam:  There were no vitals taken for this visit. No LMP recorded. (Menstrual status: Bilateral Tubal Ligation).  General: Well-nourished, well-developed in no acute distress.  Lungs: Clear to auscultation bilaterally. Non-labored. Heart: Regular rate and rhythm, no murmurs rubs or gallops.  Abdomen: Bowel sounds are normal; Abdomen is Soft; No hepatosplenomegaly, masses or hernias;  No Abdominal Tenderness; No guarding or rebound tenderness. Neuro: Alert and oriented x 3.  Grossly intact.  Psych: Alert and cooperative, normal mood and affect.   Imaging Studies: US  Transvaginal Non-OB Result Date: 12/26/2023 EXAM: US  Pelvis, Complete Transvaginal and Transabdominal with Doppler 12/26/2023 12:21:10 AM TECHNIQUE: Transabdominal pelvic duplex ultrasound using B-mode/gray scaled imaging with Doppler spectral analysis and color flow was obtained. COMPARISON: None provided CLINICAL HISTORY: R/O torsion. FINDINGS: UTERUS: Uterus demonstrates normal myometrial echotexture. Measures 9.3 x 4.4 x 4.9 cm (calculated volume 104) ENDOMETRIAL STRIPE: Endometrial stripe is within normal limits. Measures 14 mm RIGHT OVARY: Right ovary is within normal limits. Measures 3.0 x 1.6 x 1.6 cm (calculated volume 3.9 ml) Normal arterial and venous Doppler flow. LEFT OVARY: Left ovary is within normal limits. Measures 3.9 x 2.2 x 2.6 cm (calculated volume 8.4 ml) Normal arterial and venous Doppler flow. FREE FLUID: No free fluid. IMPRESSION: 1. Negative pelvic ultrasound. 2. No evidence of torsion. Electronically signed by: Zadie Herter MD 12/26/2023 12:29 AM EDT RP Workstation: [id number]   US  Art/Ven Flow Abd Pelv Doppler Result Date: 12/26/2023 EXAM: US  Pelvis, Complete Transvaginal and  Transabdominal with Doppler 12/26/2023 12:21:10 AM TECHNIQUE: Transabdominal pelvic duplex ultrasound using B-mode/gray scaled imaging with Doppler spectral analysis and color flow was obtained. COMPARISON: None provided CLINICAL HISTORY: R/O torsion. FINDINGS: UTERUS: Uterus demonstrates normal myometrial echotexture. Measures 9.3 x 4.4 x 4.9 cm (calculated volume 104) ENDOMETRIAL STRIPE: Endometrial stripe is within normal limits. Measures 14 mm RIGHT OVARY: Right ovary is within normal limits. Measures 3.0 x 1.6 x 1.6 cm (calculated volume 3.9 ml) Normal arterial and venous Doppler flow. LEFT OVARY: Left ovary is within normal limits. Measures 3.9 x 2.2 x 2.6 cm (calculated volume 8.4 ml) Normal arterial and venous Doppler flow. FREE FLUID: No free fluid. IMPRESSION: 1. Negative pelvic ultrasound. 2. No evidence of torsion. Electronically signed by: Zadie Herter MD 12/26/2023 12:29 AM EDT RP Workstation: [id number]   US  Pelvis Complete Result Date: 12/26/2023 EXAM: US  Pelvis, Complete Transvaginal and Transabdominal with Doppler 12/26/2023 12:21:10 AM TECHNIQUE: Transabdominal pelvic duplex ultrasound using B-mode/gray scaled imaging with Doppler spectral  analysis and color flow was obtained. COMPARISON: None provided CLINICAL HISTORY: R/O torsion. FINDINGS: UTERUS: Uterus demonstrates normal myometrial echotexture. Measures 9.3 x 4.4 x 4.9 cm (calculated volume 104) ENDOMETRIAL STRIPE: Endometrial stripe is within normal limits. Measures 14 mm RIGHT OVARY: Right ovary is within normal limits. Measures 3.0 x 1.6 x 1.6 cm (calculated volume 3.9 ml) Normal arterial and venous Doppler flow. LEFT OVARY: Left ovary is within normal limits. Measures 3.9 x 2.2 x 2.6 cm (calculated volume 8.4 ml) Normal arterial and venous Doppler flow. FREE FLUID: No free fluid. IMPRESSION: 1. Negative pelvic ultrasound. 2. No evidence of torsion. Electronically signed by: Zadie Herter MD 12/26/2023 12:29 AM EDT RP  Workstation: [id number]   CT ABDOMEN PELVIS W CONTRAST Result Date: 12/25/2023 EXAM: CT ABDOMEN AND PELVIS WITH CONTRAST 12/25/2023 10:42:23 PM TECHNIQUE: CT of the abdomen and pelvis was performed with of iohexol  (OMNIPAQUE ) 300 MG/ML solution. Multiplanar reformatted images are provided for review. Automated exposure control, iterative reconstruction, and/or weight based adjustment of the mA/kV was utilized to reduce the radiation dose to as low as reasonably achievable. COMPARISON: 04/25/2021 CLINICAL HISTORY: LLQ abdominal pain. FINDINGS: LOWER CHEST: Small hiatal hernia. HEPATOBILIARY: The liver is unremarkable. Status post cholecystectomy. No biliary ductal dilatation. SPLEEN: No acute abnormality. PANCREAS: No acute abnormality. ADRENAL GLANDS: No acute abnormality. KIDNEYS, URETERS: No stones in the kidneys or ureters. No evidence of hydronephrosis. No evidence of perinephric or periureteral stranding. GI AND BOWEL: Stomach demonstrates no acute abnormality. There is no evidence of bowel obstruction. No evidence of appendicitis. Normal appendix (image 61). PELVIS: Urinary bladder is unremarkable. PERITONEUM AND RETROPERITONEUM: No evidence of free fluid or free air. LYMPH NODES: No evidence of lymphadenopathy. BONES AND SOFT TISSUES: No acute osseous abnormality. No focal soft tissue abnormality. Incidental adrenal and/or renal findings do not require follow up imaging. IMPRESSION: 1. No acute findings. Electronically signed by: Zadie Herter MD 12/25/2023 10:50 PM EDT RP Workstation: [id number]    Labs: CBC    Component Value Date/Time   WBC 7.7 12/25/2023 2155   RBC 4.23 12/25/2023 2155   HGB 14.0 12/25/2023 2155   HCT 40.9 12/25/2023 2155   PLT 250 12/25/2023 2155   MCV 96.7 12/25/2023 2155   MCH 33.1 12/25/2023 2155   MCHC 34.2 12/25/2023 2155   RDW 13.9 12/25/2023 2155   LYMPHSABS 3.1 12/25/2023 2155   MONOABS 0.6 12/25/2023 2155   EOSABS 0.1 12/25/2023 2155   BASOSABS 0.0  12/25/2023 2155    CMP     Component Value Date/Time   NA 138 12/25/2023 2155   K 4.1 12/25/2023 2155   CL 111 12/25/2023 2155   CO2 21 (L) 12/25/2023 2155   GLUCOSE 96 12/25/2023 2155   BUN 10 12/25/2023 2155   CREATININE 0.78 12/25/2023 2155   CALCIUM 8.7 (L) 12/25/2023 2155   PROT 7.1 12/25/2023 2155   ALBUMIN 3.6 12/25/2023 2155   AST 13 (L) 12/25/2023 2155   ALT 16 12/25/2023 2155   ALKPHOS 68 12/25/2023 2155   BILITOT 0.3 12/25/2023 2155   GFRNONAA >60 12/25/2023 2155   GFRAA >60 01/08/2020 1951       Assessment and Plan:   [name] is a 38 y.o. y/o female presents for evaluation of abdominal pain.  She has history of chronic abdominal pain, GERD with esophagitis, erosive gastritis and irritable bowel syndrome with constipation.  Previous EGDs showed biopsies negative for H. pylori, EOE, and celiac.  Recent normal CBC, CMP, lipase labs.  Normal  abdominal pelvic CT, abdominal x-ray, and pelvic ultrasound.  Cholecystectomy in 2011.  1.  Chronic abdominal pain  2.  GERD with esophagitis  3.  History of erosive gastritis (H. pylori negative)  4.  Irritable bowel syndrome, constipation predominant    Brigitte Canard, PA-C  Follow up ***

## 2024-01-25 ENCOUNTER — Encounter: Payer: Self-pay | Admitting: Physician Assistant

## 2024-01-25 ENCOUNTER — Ambulatory Visit (INDEPENDENT_AMBULATORY_CARE_PROVIDER_SITE_OTHER): Payer: MEDICAID | Admitting: Physician Assistant

## 2024-01-25 VITALS — BP 122/64 | HR 94 | Ht 62.0 in | Wt 207.4 lb

## 2024-01-25 DIAGNOSIS — R1030 Lower abdominal pain, unspecified: Secondary | ICD-10-CM | POA: Diagnosis not present

## 2024-01-25 DIAGNOSIS — K581 Irritable bowel syndrome with constipation: Secondary | ICD-10-CM

## 2024-01-25 DIAGNOSIS — K5904 Chronic idiopathic constipation: Secondary | ICD-10-CM

## 2024-01-25 DIAGNOSIS — G8929 Other chronic pain: Secondary | ICD-10-CM | POA: Diagnosis not present

## 2024-01-25 MED ORDER — HYOSCYAMINE SULFATE 0.125 MG PO TABS: 0.1250 mg | ORAL_TABLET | Freq: Three times a day (TID) | ORAL | 2 refills | Status: DC | PRN

## 2024-01-25 MED ORDER — LINACLOTIDE 72 MCG PO CAPS: 72.0000 ug | ORAL_CAPSULE | Freq: Every day | ORAL | 2 refills | Status: DC

## 2024-01-25 NOTE — Progress Notes (Signed)
 Noted

## 2024-01-25 NOTE — Patient Instructions (Addendum)
 We have given you samples of the following medication to take: Linzess 72 mcg daily  Linzess works best when taken once a day every day, on an empty stomach, at least 30 minutes before your first meal of the day.  When Linzess is taken daily as directed:  *Constipation relief is typically felt in about a week *IBS-C patients may begin to experience relief from belly pain and overall abdominal symptoms (pain, discomfort, and bloating) in about 1 week,   with symptoms typically improving over 12 weeks.  Diarrhea may occur in the first 2 weeks -keep taking it.  The diarrhea should go away and you should start having normal, complete, full bowel movements. It may be helpful to start treatment when you can be near the comfort of your own bathroom, such as a weekend.   We have sent the following medications to your pharmacy for you to pick up at your convenience: Hyoscyamine 0.125 mg three times daily, Linzess 72 mcg daily  Thank you for trusting me with your gastrointestinal care!   Brigitte Canard, PA-C  Please follow up sooner if symptoms increase or worsen  Due to recent changes in healthcare laws, you may see the results of your imaging and laboratory studies on MyChart before your provider has had a chance to review them.  We understand that in some cases there may be results that are confusing or concerning to you. Not all laboratory results come back in the same time frame and the provider may be waiting for multiple results in order to interpret others.  Please give us  48 hours in order for your provider to thoroughly review all the results before contacting the office for clarification of your results.   _______________________________________________________  If your blood pressure at your visit was 140/90 or greater, please contact your primary care physician to follow up on this.  _______________________________________________________  If you are age 14 or older, your body mass  index should be between 23-30. Your Body mass index is 37.93 kg/m. If this is out of the aforementioned range listed, please consider follow up with your Primary Care Provider.  If you are age 42 or younger, your body mass index should be between 19-25. Your Body mass index is 37.93 kg/m. If this is out of the aformentioned range listed, please consider follow up with your Primary Care Provider.   ________________________________________________________  The [facility] providers would like to encourage you to use MYCHART to communicate with providers for non-urgent requests or questions.  Due to long hold times on the telephone, sending your provider a message by [facility] may be a faster and more efficient way to get a response.  Please allow 48 business hours for a response.  Please remember that this is for non-urgent requests.  _______________________________________________________

## 2024-02-06 ENCOUNTER — Telehealth: Payer: Self-pay

## 2024-02-06 ENCOUNTER — Other Ambulatory Visit (HOSPITAL_COMMUNITY): Payer: Self-pay

## 2024-02-06 ENCOUNTER — Encounter: Payer: Self-pay | Admitting: Neurology

## 2024-02-06 ENCOUNTER — Ambulatory Visit: Payer: MEDICAID | Admitting: Neurology

## 2024-02-06 ENCOUNTER — Telehealth: Payer: Self-pay | Admitting: Neurology

## 2024-02-06 VITALS — BP 106/72 | HR 93 | Ht 62.75 in | Wt 204.0 lb

## 2024-02-06 DIAGNOSIS — G43709 Chronic migraine without aura, not intractable, without status migrainosus: Secondary | ICD-10-CM | POA: Insufficient documentation

## 2024-02-06 MED ORDER — AJOVY 225 MG/1.5ML ~~LOC~~ SOAJ: 225.0000 mg | SUBCUTANEOUS | 11 refills | Status: DC

## 2024-02-06 MED ORDER — ONDANSETRON 8 MG PO TBDP
8.0000 mg | ORAL_TABLET | Freq: Three times a day (TID) | ORAL | 11 refills | Status: AC | PRN
Start: 2024-02-06 — End: ?

## 2024-02-06 MED ORDER — RIZATRIPTAN BENZOATE 10 MG PO TBDP: 10.0000 mg | ORAL_TABLET | ORAL | 11 refills | Status: AC | PRN

## 2024-02-06 NOTE — Telephone Encounter (Signed)
 Pharmacy Patient Advocate Encounter   Received notification from Physician's Office that prior authorization for AJOVY (fremanezumab-vfrm) injection 225MG /1.5ML auto-injectors is required/requested.   Insurance verification completed.   The patient is insured through [employer] .   Per test claim: PA required; PA submitted to above mentioned insurance via CoverMyMeds Key/confirmation #/EOC [id number] Status is pending

## 2024-02-06 NOTE — Telephone Encounter (Signed)
 Pt called stating that the medication that was sent in for her today is needing a PA. Please advise.

## 2024-02-06 NOTE — Telephone Encounter (Signed)
 Pt called wanting to know the update of the PA. Pt was informed that it may take a couple of days. I informed her that she is more than welcome to call back tomorrow for an update.

## 2024-02-06 NOTE — Telephone Encounter (Signed)
 Pt called to check on PA for Ajovy,  Informed patient PA has been sent to insurance for approval.

## 2024-02-06 NOTE — Progress Notes (Signed)
 [facility]    Provider:  Dr Tresia Fruit Requesting Provider: Aldine Humphreys, PA Primary Care Provider:  Aldine Humphreys, PA  CC:  Migraines  HPI:  [name] is a 38 y.o. female here as requested by Aldine Humphreys, PA for migraines. has Major depressive disorder, recurrent episode, severe (HCC); Bipolar affective disorder, depressed, severe (HCC); Bipolar disorder with depression (HCC); MDD (major depressive disorder), recurrent episode, severe (HCC); MDD (major depressive disorder); Pelvic pain in female; Severe recurrent major depression with psychotic features (HCC); Substance use disorder; Bipolar 1 disorder, depressed, severe (HCC); Hiatal hernia; and Chronic migraine without aura without status migrainosus, not intractable on their problem list.ADHD, HSV-1/HSV-2, asthma, rls.   Here with mother. She is on Topamax  for so long and it isn;t really working. She was on Aimovig  a long time ago and doesn't remember why she stopped. But the shot helped. Her headaches worsening, she has been to the chiropractor for her neck, that isn't helping. Migraines start in the back of her neck at the base of the skull, both sides and radiates up to the forehead. She has light sensitivity, sound sensitivity, she wants to be in a dark room and sleep which helps, nausea, bilateral, no vomiting, throbbing can be moderate to severe. She can have at least Migraines can last 2-3 days and has 4 migraine days a month for an average of 10 migraine days a month. She has some sort of headache every day of the month. Ongoing > 1 year. Symptoms have not changed, not positional , not exertional, no new vision changes. Becoming more frequent in the last year. No aura. No aura.  Mother and grandmother with migraines. No other focal neurologic deficits, associated symptoms, inciting events or modifiable factors.  Reviewed notes, labs and imaging from outside physicians, which showed:  From a thorough  review of records and patient report, Medications tried that can be used in migraine/headache management greater than 3 months include: Lifestyle modification, headache diaries, better sleep hygiene, exercise, management of migraine triggers, OTC and prescribed analgesics/nsaids such as ibuprofen , excedrin, alleve and others, topamax , trazodone , flexeril, propranolol and other beta-blockers contraindicated due to asthma, baclofen, carbamazepine , Celexa , Depakote , doxepin , Cymbalta , Aimovig , Lexapro , Prozac ,Gabapentin , Lamictal , Robaxin , Reglan , however she has tried propranolol as well Seroquel , Nurtec, Risperdal , Zoloft , Effexor , amitriptyline/nortriptyline or other TCAs are contraindicated because patient is currently on trazodone  and the combination is a risk factor for serotonin syndrome.Nurtec helped in the past. Takes tylenol  for migraines but no medication overuse  about 8-10x a month.  CT head, reviewed images and agree:  05/04/2023: FINDINGS: Brain: There is no evidence of an acute infarct, intracranial hemorrhage, mass, midline shift, or extra-axial fluid collection. The ventricles and sulci are unchanged and normal.   Vascular: No hyperdense vessel.   Skull: No acute fracture or suspicious osseous lesion.   Sinuses/Orbits: Visualized paranasal sinuses are clear. Trace left mastoid fluid. Left cataract extraction.   Other: None.   IMPRESSION: No evidence of acute intracranial abnormality.  12/25/2023: CBC nml, CMP unremarkable slightly decr co2,calcium and ast    Review of Systems: Patient complains of symptoms per HPI as well as the following symptoms No SOB, cough, fever. Pertinent negatives and positives per HPI. All others negative.   Social History   Socioeconomic History   Marital status: Legally Separated    Spouse name: Not on file   Number of children: 2   Years of education: Not on file   Highest education level: Not on file  Occupational History   Not on file   Tobacco Use   Smoking status: Every Day    Current packs/day: 0.25    Average packs/day: 0.3 packs/day for 5.0 years (1.3 ttl pk-yrs)    Types: Cigarettes   Smokeless tobacco: Never  Vaping Use   Vaping status: Every Day   Substances: Nicotine , Flavoring  Substance and Sexual Activity   Alcohol use: Never   Drug use: Not Currently    Types: Marijuana    Comment: last use 11 oct 22   Sexual activity: Yes    Partners: Male    Birth control/protection: Surgical  Other Topics Concern   Not on file  Social History Narrative   Right handed   Caffeine: tea 2-3 times a week    Social Drivers of Health   Financial Resource Strain: Low Risk  (09/22/2023)   Received from [facility]   Overall Financial Resource Strain (CARDIA)    Difficulty of Paying Living Expenses: Not hard at all  Food Insecurity: No Food Insecurity (09/22/2023)   Received from [facility]   Hunger Vital Sign    Worried About Running Out of Food in the Last Year: Never true    Ran Out of Food in the Last Year: Never true  Transportation Needs: No Transportation Needs (09/22/2023)   Received from [facility] - Transportation    Lack of Transportation (Medical): No    Lack of Transportation (Non-Medical): No  Physical Activity: Unknown (09/01/2022)   [facility]   Exercise Vital Sign    Days of Exercise per Week: 0 days    Minutes of Exercise per Session: Not on file  Stress: No Stress Concern Present (09/01/2022)   Received from [facility], [facility] - Occupational Stress Questionnaire    Feeling of Stress : Only a little  Social Connections: Socially Integrated (09/01/2022)   [facility] from [facility]   Social Network    How would you rate your social network (family, work, friends)?: Good participation with social networks  Intimate Partner Violence: Not At Risk (09/01/2022)   Received from [facility], [facility]   HITS    Over the last 12 months how often did your partner physically hurt you?: Never    Over the last 12 months how often did your partner insult you or talk down to you?: Never    Over the last 12 months how often did your partner threaten you with physical harm?: Never    Over the last 12 months how often did your partner scream or curse at you?: Never    Family History  Problem Relation Age of Onset   Stroke Mother    Atrial fibrillation Mother    Migraines Maternal Grandmother    Hyperlipidemia Other    Colon cancer Neg Hx    Stomach cancer Neg Hx    Pancreatic cancer Neg Hx    Esophageal cancer Neg Hx     Past Medical History:  Diagnosis Date   Anxiety    Asthma    Bipolar 1 disorder (HCC)    Bipolar 1 disorder (HCC)    Cataract    left eye   Depression    Endometriosis    GERD (gastroesophageal reflux disease)    Mental disorder    Migraine     Patient Active Problem List   Diagnosis Date Noted   Chronic migraine without  aura without status migrainosus, not intractable 02/06/2024   Hiatal hernia 10/10/2019   Bipolar 1 disorder, depressed, severe (HCC) 02/22/2019   Severe recurrent major depression with psychotic features (HCC) 07/23/2017   Substance use disorder 07/23/2017   Pelvic pain in female 01/15/2015   MDD (major depressive disorder), recurrent episode, severe (HCC) 03/11/2014   MDD (major depressive disorder) 03/11/2014   Bipolar disorder with depression (HCC) 09/14/2013   Major depressive disorder, recurrent episode, severe (HCC) 12/30/2011   Bipolar affective disorder, depressed, severe (HCC) 12/30/2011    Past Surgical History:  Procedure Laterality Date   ABDOMINAL SURGERY     "For endometriosis"   CHOLECYSTECTOMY     HERNIA REPAIR     OOPHORECTOMY Right 08/30/2010   TUBAL LIGATION      Current Outpatient Medications  Medication Sig Dispense Refill   acetaminophen  (TYLENOL ) 325 MG tablet Take 2 tablets (650 mg  total) by mouth every 6 (six) hours as needed. 30 tablet 0   amphetamine-dextroamphetamine (ADDERALL) 10 MG tablet Take 20 mg by mouth daily.     beclomethasone (QVAR) 40 MCG/ACT inhaler Inhale 2 puffs into the lungs.     Cariprazine  HCl 4.5 MG CAPS Take 4.5 mg by mouth daily.     clonazePAM  (KLONOPIN ) 1 MG tablet Take 1 mg by mouth 3 (three) times daily as needed for anxiety.      Galcanezumab-gnlm (EMGALITY) 120 MG/ML SOAJ Dispence 2ml first month with 0 refill to inject 2 shots the first month.Every month thereafter dispense72ml with 11 refills to inject once monthly. Copay card: BIN [id number] PCN PDMI GRP [id number] ID [id number] EXP 08/29/2024 1.12 mL 11   hyoscyamine  (LEVSIN) 0.125 MG tablet Take 1 tablet (0.125 mg total) by mouth 3 (three) times daily as needed. 90 tablet 2   linaclotide  (LINZESS ) 72 MCG capsule Take 1 capsule (72 mcg total) by mouth daily before breakfast. 30 capsule 2   ondansetron  (ZOFRAN -ODT) 8 MG disintegrating tablet Take 1 tablet (8 mg total) by mouth every 8 (eight) hours as needed. For nausea. 20 tablet 11   pantoprazole  (PROTONIX ) 40 MG tablet Take 1 tablet (40 mg total) by mouth 2 (two) times daily. 180 tablet 3   primidone  (MYSOLINE ) 50 MG tablet Take 100 mg by mouth daily.     PROAIR  HFA 108 (90 Base) MCG/ACT inhaler SMARTSIG:1-2 Puff(s) Via Inhaler PRN     rizatriptan (MAXALT-MLT) 10 MG disintegrating tablet Take 1 tablet (10 mg total) by mouth as needed for migraine. May repeat in 2 hours if needed 9 tablet 11   rOPINIRole  (REQUIP ) 3 MG tablet SMARTSIG:1 Tablet(s) By Mouth Every Evening     topiramate  (TOPAMAX ) 100 MG tablet Take 100 mg by mouth 2 (two) times daily.     traZODone  (DESYREL ) 100 MG tablet Take 50-150 mg by mouth at bedtime.     valACYclovir  (VALTREX ) 500 MG tablet Take 500 mg by mouth daily.     No current facility-administered medications for this visit.   Facility-Administered Medications Ordered in Other Visits  Medication Dose Route Frequency  Provider Last Rate Last Admin   sodium chloride  flush (NS) 0.9 % injection 20 mL  20 mL Intravenous PRN Malesha Suliman B, MD   20 mL at 02/11/20 1301    Allergies as of 02/06/2024 - Review Complete 02/06/2024  Allergen Reaction Noted   Bee venom Anaphylaxis and Swelling 01/15/2015   Coconut flavoring agent (non-screening) Anaphylaxis 09/23/2014   Ibuprofen  Hives 07/22/2017   Naproxen  Nausea And Vomiting 05/03/2017  Tramadol  Other (See Comments) 11/20/2019    Vitals: BP 106/72 (BP Location: Right Arm, Patient Position: Sitting, Cuff Size: Large)   Pulse 93   Ht 5' 2.75" (1.594 m)   Wt 204 lb (92.5 kg)   LMP 01/28/2024 (Exact Date)   BMI 36.43 kg/m  Last Weight:  Wt Readings from Last 1 Encounters:  02/06/24 204 lb (92.5 kg)   Last Height:   Ht Readings from Last 1 Encounters:  02/06/24 5' 2.75" (1.594 m)     Physical exam: Exam: Gen: NAD, conversant, well nourised, obese, well groomed                     CV: RRR, no MRG. No Carotid Bruits. No peripheral edema, warm, nontender Eyes: Conjunctivae clear without exudates or hemorrhage  Neuro: Detailed Neurologic Exam  Speech:    Speech is normal; fluent and spontaneous with normal comprehension.  Cognition:    The patient is oriented to person, place, and time;     recent and remote memory intact;     language fluent;     normal attention, concentration,     fund of knowledge Cranial Nerves:    The pupils are equal, round, and reactive to light. The fundi are normal and spontaneous venous pulsations are present. Visual fields are full to finger confrontation. Extraocular movements are intact. Trigeminal sensation is intact and the muscles of mastication are normal. The face is symmetric. The palate elevates in the midline. Hearing intact. Voice is normal. Shoulder shrug is normal. The tongue has normal motion without fasciculations.   Coordination: nml  Gait: nml  Motor Observation:    No asymmetry, no atrophy,  and no involuntary movements noted. Tone:    Normal muscle tone.    Posture:    Posture is normal. normal erect    Strength:    Strength is V/V in the upper and lower limbs.      Sensation: intact to LT     Reflex Exam:  DTR's:    Deep tendon reflexes in the upper and lower extremities are symmetrical bilaterally.   Toes:    The toes are equivocal bilaterally.   Clonus:    Clonus is absent.    Assessment/Plan:  Patient with chronic migraines. CT head 2024 normal, no new symptoms with her migraines, neuro exam non focal, no red flags to indicate new imaging at this time but low threshold in the future if needed. Failed multiple medications  Migraine prevention: Prescribe: Ajovy Once a month injection. EMgality is 2 injections the first month then one injection every month after. Aimovig  is a once a month injectio. Try Ajovy first depends on insurance. Migraine prevention. Can sign up for copay card if copay is too high. Also consider Qulipta, Vyepti, Botox At onset of migraine: Try Rizatriptan. Please take one tablet at the onset of your headache. If it does not improve the symptoms please take one additional tablet in 2 hours. Do not take more then 2 tablets in 24hrs. Do not take use more then 2 to 3 times in a week. If improved could consider Nurtec or Ubrelvy. Other triptans in that class as well Ondansetron  for nausea  Meds ordered this encounter  Medications   DISCONTD: Fremanezumab-vfrm (AJOVY) 225 MG/1.5ML SOAJ    Sig: Inject 225 mg into the skin every 30 (thirty) days.    Dispense:  1.5 mL    Refill:  11   rizatriptan (MAXALT-MLT) 10 MG disintegrating tablet  Sig: Take 1 tablet (10 mg total) by mouth as needed for migraine. May repeat in 2 hours if needed    Dispense:  9 tablet    Refill:  11   ondansetron  (ZOFRAN -ODT) 8 MG disintegrating tablet    Sig: Take 1 tablet (8 mg total) by mouth every 8 (eight) hours as needed. For nausea.    Dispense:  20 tablet    Refill:   11   Galcanezumab-gnlm (EMGALITY) 120 MG/ML SOAJ    Sig: Dispence 2ml first month with 0 refill to inject 2 shots the first month.Every month thereafter dispense103ml with 11 refills to inject once monthly. Copay card: BIN [id number] PCN PDMI GRP [id number] ID [id number] EXP 08/29/2024    Dispense:  1.12 mL    Refill:  11    Cc: Aldine Humphreys, PA,  [place], [place], [place]  Aldona Amel, MD  [facility] [address] [place], [place] [address]  Phone [phone] Fax [phone]

## 2024-02-06 NOTE — Patient Instructions (Addendum)
 Migraine prevention: Prescribe: Ajovy Once a month injection. EMgality is 2 injections the first month then one injection every month after. Aimovig  is a once a month injectio. Try Ajovy first depends on insurance. Migraine prevention. Can sign up for copay card if copay is too high.  At onset of migraine: Try Rizatriptan. Please take one tablet at the onset of your headache. If it does not improve the symptoms please take one additional tablet in 2 hours. Do not take more then 2 tablets in 24hrs. Do not take use more then 2 to 3 times in a week. Ondansetron  for nausea   Fremanezumab Injection What is this medication? FREMANEZUMAB (fre ma NEZ ue mab) prevents migraines. It works by blocking a substance in the body that causes migraines. It is a monoclonal antibody. This medicine may be used for other purposes; ask your health care provider or pharmacist if you have questions. COMMON BRAND NAME(S): AJOVY What should I tell my care team before I take this medication? They need to know if you have any of these conditions: An unusual or allergic reaction to fremanezumab, other medications, foods, dyes, or preservatives Pregnant or trying to get pregnant Breast-feeding How should I use this medication? This medication is injected under the skin. You will be taught how to prepare and give it. Take it as directed on the prescription label. Keep taking it unless your care team tells you to stop. It is important that you put your used needles and syringes in a special sharps container. Do not put them in a trash can. If you do not have a sharps container, call your pharmacist or care team to get one. Talk to your care team about the use of this medication in children. Special care may be needed. Overdosage: If you think you have taken too much of this medicine contact a poison control center or emergency room at once. NOTE: This medicine is only for you. Do not share this medicine with others. What if I  miss a dose? If you miss a dose, take it as soon as you can. If it is almost time for your next dose, take only that dose. Do not take double or extra doses. What may interact with this medication? Interactions are not expected. This list may not describe all possible interactions. Give your health care provider a list of all the medicines, herbs, non-prescription drugs, or dietary supplements you use. Also tell them if you smoke, drink alcohol, or use illegal drugs. Some items may interact with your medicine. What should I watch for while using this medication? Tell your care team if your symptoms do not start to get better or if they get worse. What side effects may I notice from receiving this medication? Side effects that you should report to your care team as soon as possible: Allergic reactions or angioedema--skin rash, itching or hives, swelling of the face, eyes, lips, tongue, arms, or legs, trouble swallowing or breathing Side effects that usually do not require medical attention (report to your care team if they continue or are bothersome): Pain, redness, or irritation at injection site This list may not describe all possible side effects. Call your doctor for medical advice about side effects. You may report side effects to FDA at 1-800-FDA-1088. Where should I keep my medication? Keep out of the reach of children and pets. Store in a refrigerator or at room temperature between 20 and 25 degrees C (68 and 77 degrees F). Refrigeration (preferred): Store in  the refrigerator. Do not freeze. Keep in the original container until you are ready to take it. Remove the dose from the carton about 30 minutes before it is time for you to use it. If the dose is not used, it may be stored in the original container at room temperature for 7 days. Get rid of any unused medication after the expiration date. Room Temperature: This medication may be stored at room temperature for up to 7 days. Keep it in  the original container. Protect from light until time of use. If it is stored at room temperature, get rid of any unused medication after 7 days or after it expires, whichever is first. To get rid of medications that are no longer needed or have expired: Take the medication to a medication take-back program. Check with your pharmacy or law enforcement to find a location. If you cannot return the medication, ask your pharmacist or care team how to get rid of this medication safely. NOTE: This sheet is a summary. It may not cover all possible information. If you have questions about this medicine, talk to your doctor, pharmacist, or health care provider.  2024 Elsevier/Gold Standard (2021-10-09 00:00:00)Rizatriptan Disintegrating Tablets What is this medication? RIZATRIPTAN (rye za TRIP tan) treats migraines. It works by blocking pain signals and narrowing blood vessels in the brain. It belongs to a group of medications called triptans. It is not used to prevent migraines. This medicine may be used for other purposes; ask your health care provider or pharmacist if you have questions. COMMON BRAND NAME(S): Maxalt-MLT What should I tell my care team before I take this medication? They need to know if you have any of these conditions: Circulation problems in fingers and toes Diabetes Heart disease High blood pressure High cholesterol History of irregular heartbeat History of stroke Stomach or intestine problems Tobacco use An unusual or allergic reaction to rizatriptan, other medications, foods, dyes, or preservatives Pregnant or trying to get pregnant Breast-feeding How should I use this medication? Take this medication by mouth. Take it as directed on the prescription label. You do not need water to take this medication. Leave the tablet in the sealed pack until you are ready to take it. With dry hands, open the pack and gently remove the tablet. If the tablet breaks or crumbles, throw it  away. Use a new tablet. Place the tablet on the tongue and allow it to dissolve. Then, swallow it. Do not cut, crush, or chew this medication. Do not use it more often than directed. Talk to your care team about the use of this medication in children. While it may be prescribed for children as young as 6 years for selected conditions, precautions do apply. Overdosage: If you think you have taken too much of this medicine contact a poison control center or emergency room at once. NOTE: This medicine is only for you. Do not share this medicine with others. What if I miss a dose? This does not apply. This medication is not for regular use. What may interact with this medication? Do not take this medication with any of the following: Ergot alkaloids, such as dihydroergotamine, ergotamine MAOIs, such as Marplan, Nardil, Parnate Other medications for migraine headache, such as almotriptan, eletriptan, frovatriptan, naratriptan, sumatriptan, zolmitriptan This medication may also interact with the following: Certain medications for depression, anxiety, or other mental health conditions Propranolol This list may not describe all possible interactions. Give your health care provider a list of all the medicines, herbs,  non-prescription drugs, or dietary supplements you use. Also tell them if you smoke, drink alcohol, or use illegal drugs. Some items may interact with your medicine. What should I watch for while using this medication? Visit your care team for regular checks on your progress. Tell your care team if your symptoms do not start to get better or if they get worse. This medication may affect your coordination, reaction time, or judgment. Do not drive or operate machinery until you know how this medication affects you. Sit up or stand slowly to reduce the risk of dizzy or fainting spells. If you take migraine medications for 10 or more days a month, your migraines may get worse. Keep a diary of  headache days and medication use. Contact your care team if your migraine attacks occur more frequently. What side effects may I notice from receiving this medication? Side effects that you should report to your care team as soon as possible: Allergic reactions--skin rash, itching, hives, swelling of the face, lips, tongue, or throat Burning, pain, tingling, or color changes in the hands, arms, legs, or feet Heart attack--pain or tightness in the chest, shoulders, arms, or jaw, nausea, shortness of breath, cold or clammy skin, feeling faint or lightheaded Heart rhythm changes--fast or irregular heartbeat, dizziness, feeling faint or lightheaded, chest pain, trouble breathing Increase in blood pressure Irritability, confusion, fast or irregular heartbeat, muscle stiffness, twitching muscles, sweating, high fever, seizure, chills, vomiting, diarrhea, which may be signs of serotonin syndrome Raynaud syndrome--cool, numb, or painful fingers or toes that may change color from pale, to blue, to red Seizures Stroke--sudden numbness or weakness of the face, arm, or leg, trouble speaking, confusion, trouble walking, loss of balance or coordination, dizziness, severe headache, change in vision Sudden or severe stomach pain, bloody diarrhea, fever, nausea, vomiting Vision loss Side effects that usually do not require medical attention (report to your care team if they continue or are bothersome): Dizziness Unusual weakness or fatigue This list may not describe all possible side effects. Call your doctor for medical advice about side effects. You may report side effects to FDA at 1-800-FDA-1088. Where should I keep my medication? Keep out of the reach of children and pets. Store at room temperature between 15 and 30 degrees C (59 and 86 degrees F). Protect from light and moisture. Get rid of any unused medication after the expiration date. To get rid of medications that are no longer needed or have  expired: Take the medication to a medication take-back program. Check with your pharmacy or law enforcement to find a location. If you cannot return the medication, check the label or package insert to see if the medication should be thrown out in the garbage or flushed down the toilet. If you are not sure, ask your care team. If it is safe to put it in the trash, empty the medication out of the container. Mix the medication with cat litter, dirt, coffee grounds, or other unwanted substance. Seal the mixture in a bag or container. Put it in the trash. NOTE: This sheet is a summary. It may not cover all possible information. If you have questions about this medicine, talk to your doctor, pharmacist, or health care provider.  2024 Elsevier/Gold Standard (2021-12-17 00:00:00)Ondansetron  Dissolving Tablets What is this medication? ONDANSETRON  (on DAN se tron) prevents nausea and vomiting from chemotherapy, radiation, or surgery. It works by blocking substances in the body that may cause nausea or vomiting. It belongs to a group of medications called  antiemetics. This medicine may be used for other purposes; ask your health care provider or pharmacist if you have questions. COMMON BRAND NAME(S): Zofran  ODT What should I tell my care team before I take this medication? They need to know if you have any of these conditions: Heart disease Irregular heartbeat or rhythm Liver disease Low levels of magnesium  or potassium in the blood An unusual or allergic reaction to ondansetron , other medications, foods, dyes, or preservatives Pregnant or trying to get pregnant Breastfeeding How should I use this medication? Take this medication by mouth. Take it as directed on the prescription label at the same time every day. You do not need water to take this medication. Leave the tablet in the sealed pack until you are ready to take it. With dry hands, open the pack and gently remove the tablet. Place the tablet in  the mouth and allow it to dissolve. Then, swallow it. Talk to your care team about the use of this medication in children. Special care may be needed. Overdosage: If you think you have taken too much of this medicine contact a poison control center or emergency room at once. NOTE: This medicine is only for you. Do not share this medicine with others. What if I miss a dose? If you miss a dose, take it as soon as you can. If it is almost time for your next dose, take only that dose. Do not take double or extra doses. What may interact with this medication? Do not take this medication with any of the following: Apomorphine Certain medications for fungal infections, such as fluconazole, ketoconazole, posaconazole Cisapride Dronedarone Levoketoconazole Pimozide Quinidine Thioridazine This medication may also interact with the following: Certain medications for depression, anxiety, or other mental health conditions Certain medications for migraines, such as sumatriptan Linezolid Methylene blue Opioids Other medications that cause heart rhythm changes, such as dofetilide or ziprasidone  St. John's wort Stimulant medications for ADHD, weight loss, or staying awake Tryptophan This list may not describe all possible interactions. Give your health care provider a list of all the medicines, herbs, non-prescription drugs, or dietary supplements you use. Also tell them if you smoke, drink alcohol, or use illegal drugs. Some items may interact with your medicine. What should I watch for while using this medication? Check with your care team as soon as you can if you have any sign of an allergic reaction. What side effects may I notice from receiving this medication? Side effects that you should report to your care team as soon as possible: Allergic reactions--skin rash, itching, hives, swelling of the face, lips, tongue, or throat Bowel blockage--stomach cramping, unable to have a bowel movement or  pass gas, loss of appetite, vomiting Chest pain (angina)--pain, pressure, or tightness in the chest, neck, back, or arms Heart rhythm changes--fast or irregular heartbeat, dizziness, feeling faint or lightheaded, chest pain, trouble breathing Irritability, confusion, fast or irregular heartbeat, muscle stiffness, twitching muscles, sweating, high fever, seizure, chills, vomiting, diarrhea, which may be signs of serotonin syndrome Side effects that usually do not require medical attention (report to your care team if they continue or are bothersome): Constipation Diarrhea General discomfort and fatigue Headache This list may not describe all possible side effects. Call your doctor for medical advice about side effects. You may report side effects to FDA at 1-800-FDA-1088. Where should I keep my medication? Keep out of the reach of children and pets. Store between 2 and 30 degrees C (36 and 86 degrees F). Throw  away any unused medication after the expiration date. NOTE: This sheet is a summary. It may not cover all possible information. If you have questions about this medicine, talk to your doctor, pharmacist, or health care provider.  2024 Elsevier/Gold Standard (2022-10-20 00:00:00)

## 2024-02-07 MED ORDER — EMGALITY 120 MG/ML ~~LOC~~ SOAJ: 240.0000 mg | Freq: Once | SUBCUTANEOUS | 0 refills | Status: AC

## 2024-02-07 MED ORDER — EMGALITY 120 MG/ML ~~LOC~~ SOAJ: SUBCUTANEOUS | 11 refills | Status: DC

## 2024-02-07 NOTE — Telephone Encounter (Signed)
 Yes I will change to emgalit thanks

## 2024-02-07 NOTE — Telephone Encounter (Signed)
 Patient said spoke with insurance company, have not received the PA. Gave me a fax number to fax PA: [phone]. Would like a call back.

## 2024-02-07 NOTE — Telephone Encounter (Signed)
 Dr Tresia Fruit, ok to change to [facility]? Her particular insurance also will not cover Ubrelvy or Nurtec either to treat or prevent migraines if insurance is being asked to cover CGRP injectable.

## 2024-02-07 NOTE — Telephone Encounter (Addendum)
 I called the patient and let her know that since insurance has denied the Ajovy, Dr. Tresia Fruit prescribed Emgality instead.  This may require a prior authorization as well. Patient aware that the loading dose of Emgality is 2 pens followed by 1 pen every days going forward. Patient will wait for notification either from pharmacy or us  once PA completed (if needed). Patient was appreciative for the call back.   I called [facility].  They are saying a PA is needed and they will need a separate prescription for the 240 mg loading dose.  I sent this to the pharmacy and have notified the PA team.

## 2024-02-07 NOTE — Telephone Encounter (Signed)
 Patient said spoke with insurance company and they said have not received PA for Ajovy. Informed patient insurance has denied and nurse is working it with Dr. Tresia Fruit.

## 2024-02-07 NOTE — Telephone Encounter (Signed)
 Pharmacy Patient Advocate Encounter  Received notification from [facility] that Prior Authorization for AJOVY (fremanezumab-vfrm) injection 225MG /1.5ML auto-injectors has been DENIED.  Full denial letter will be uploaded to the media tab. See denial reason below.   PA #/Case ID/Reference #: PA Case ID #: [id number]

## 2024-02-07 NOTE — Telephone Encounter (Signed)
 She may be calling the wrong dept in her insurance. We will see what Dr Tresia Fruit recommends.

## 2024-02-07 NOTE — Addendum Note (Signed)
 Addended by: Burns Carwin on: 02/07/2024 05:26 PM   Modules accepted: Orders

## 2024-02-07 NOTE — Addendum Note (Signed)
 Addended by: Anaika Santillano B on: 02/07/2024 02:36 PM   Modules accepted: Orders

## 2024-02-08 ENCOUNTER — Telehealth: Payer: Self-pay

## 2024-02-08 ENCOUNTER — Other Ambulatory Visit (HOSPITAL_COMMUNITY): Payer: Self-pay

## 2024-02-08 NOTE — Telephone Encounter (Signed)
 Pharmacy Patient Advocate Encounter   Received notification from Physician's Office that prior authorization for Emgality 120MG /ML auto-injectors (migraine) is required/requested.   Insurance verification completed.   The patient is insured through [facility] .   Per test claim: PA required; PA submitted to above mentioned insurance via CoverMyMeds Key/confirmation #/EOC [id number] Status is pending

## 2024-02-10 NOTE — Telephone Encounter (Signed)
 Pharmacy Patient Advocate Encounter  Received notification from [employer] that Prior Authorization for Emgality  120MG /ML auto-injectors (migraine) has been DENIED.  Full denial letter will be uploaded to the media tab. See denial reason below.   PA #/Case ID/Reference #: N/A   This plan will not allow a PT to be on two CGRP's even if one is for preventative and the other I for acute.

## 2024-02-13 ENCOUNTER — Other Ambulatory Visit (HOSPITAL_COMMUNITY): Payer: Self-pay

## 2024-02-13 NOTE — Telephone Encounter (Signed)
 Pharmacy Patient Advocate Encounter   Received notification from Physician's Office that prior authorization for Emgality  120MG /ML auto-injectors (migraine) is required/requested.   Insurance verification completed.   The patient is insured through [facility] .   Per test claim: PA required; PA submitted to above mentioned insurance via CoverMyMeds Key/confirmation #/EOC [id number] Status is pending

## 2024-02-13 NOTE — Telephone Encounter (Signed)
 Pt called in regards to medication  Galcanezumab -gnlm (EMGALITY ) 120 MG/ML SOAJ   Pt states that she has not had her medication this month and is not sure whats going on Pt Pharmacy informed pt to call MD .   [id number] - [place] - [address] (Ph: [phone]

## 2024-02-13 NOTE — Telephone Encounter (Signed)
 Pharmacy Patient Advocate Encounter  Received notification from [employer] that Prior Authorization for Emgality  120MG /ML auto-injectors (migraine) has been APPROVED from 02/13/2024 to 08/14/2024. Ran test claim, Copay is $15.00. This test claim was processed through [facility]- copay amounts may vary at other pharmacies due to pharmacy/plan contracts, or as the patient moves through the different stages of their insurance plan.   PA #/Case ID/Reference #: N/A

## 2024-02-15 ENCOUNTER — Other Ambulatory Visit (HOSPITAL_COMMUNITY): Payer: Self-pay

## 2024-02-15 DIAGNOSIS — M47816 Spondylosis without myelopathy or radiculopathy, lumbar region: Secondary | ICD-10-CM | POA: Insufficient documentation

## 2024-02-16 ENCOUNTER — Other Ambulatory Visit: Payer: Self-pay

## 2024-02-17 ENCOUNTER — Other Ambulatory Visit: Payer: Self-pay

## 2024-02-17 MED ORDER — EMGALITY 120 MG/ML ~~LOC~~ SOAJ
240.0000 mg | Freq: Once | SUBCUTANEOUS | 1 refills | Status: AC
  Filled 2024-02-17: qty 2, 30d supply, fill #0

## 2024-02-20 ENCOUNTER — Other Ambulatory Visit: Payer: Self-pay

## 2024-03-14 ENCOUNTER — Other Ambulatory Visit (HOSPITAL_COMMUNITY): Payer: Self-pay

## 2024-03-14 ENCOUNTER — Telehealth: Payer: Self-pay | Admitting: Neurology

## 2024-03-14 ENCOUNTER — Other Ambulatory Visit: Payer: Self-pay

## 2024-03-14 ENCOUNTER — Other Ambulatory Visit: Payer: Self-pay | Admitting: *Deleted

## 2024-03-14 DIAGNOSIS — G43709 Chronic migraine without aura, not intractable, without status migrainosus: Secondary | ICD-10-CM

## 2024-03-14 MED ORDER — EMGALITY 120 MG/ML ~~LOC~~ SOAJ
SUBCUTANEOUS | 11 refills | Status: DC
  Filled 2024-03-14 – 2024-03-15 (×4): qty 1, 30d supply, fill #0

## 2024-03-14 NOTE — Telephone Encounter (Signed)
 Pt is asking if going forward all future refills for her Galcanezumab -gnlm (EMGALITY ) 120 MG/ML SOAJ be sent to [facility] at [facility]

## 2024-03-14 NOTE — Telephone Encounter (Signed)
 done

## 2024-03-15 ENCOUNTER — Other Ambulatory Visit: Payer: Self-pay

## 2024-03-15 NOTE — Telephone Encounter (Signed)
 Pt called  and states that Pt went to Pharmacy  to get medication . Pt states the medication cost  to much and was wondering if she can change medication from injection to  pill form

## 2024-03-15 NOTE — Telephone Encounter (Signed)
 Pt said that she cannot afford $40 for the emaglity.  She mentioned a tablet.  I said that quilipta but it is daily medication.  We have no once month oral med for migraines.  PAP [employer]cares option as her insurance is [employer].  She will look into and let us  know.  Electronics engineer

## 2024-03-23 ENCOUNTER — Other Ambulatory Visit: Payer: Self-pay

## 2024-03-30 DIAGNOSIS — R251 Tremor, unspecified: Secondary | ICD-10-CM | POA: Insufficient documentation

## 2024-03-30 DIAGNOSIS — K581 Irritable bowel syndrome with constipation: Secondary | ICD-10-CM | POA: Insufficient documentation

## 2024-04-01 NOTE — Progress Notes (Unsigned)
 Ellouise Console, PA-C [address] [place], [place]  [address] Phone: [phone]   Primary Care Physician: Juliane Che, [place]  Primary Gastroenterologist:  Ellouise Console, PA-C / Glendia Holt, MD   Chief Complaint: Follow-up IBS, GERD, abdominal pain, constipation       HPI:   [name] is a 38 y.o. female returns for follow-up of GERD, esophagitis, irritable bowel syndrome, chronic constipation, chronic abdominal pain.  She is here today with her mother.  She took MiraLAX  for constipation which Did not work well.  2 months ago she was started on Linzess  72mcg daily and hyoscyamine  0.125 Mg 3 times daily as needed.  She also takes Protonix  40 Mg once daily.  On this treatment her GI symptoms have improved.  She is having 1 loose bowel movement daily.  Abdominal pain has decreased.  Acid reflux is under control.  She would like to continue these medications because they are working well.  She has no new GI concerns today.  12/25/2023 Pelvic ultrasound unremarkable.   12/25/2023 abdominal pelvic CT with contrast: No acute abnormality.   12/23/2023 abdominal x-ray 2 view: No bowel obstruction.  Low stool burden.   12/25/23 labs: Urine pregnancy test negative.  Normal CBC, CMP, and lipase.  Negative UA.   She last saw Dr. Eda in our office 08/2022 for follow-up of GERD with esophagitis.  Treated with pantoprazole  40 Mg twice daily and dicyclomine  as needed.   EGD 05/2021:  LA grade a reflux esophagitis, gastritis, and gastric erosions.  Biopsies negative for H. Pylori, celiac and EOE.   07/2019 EGD:  Normal esophagus, a medium sized hiatal hernia, gastric erosions in the body and antrum, and a 3mm gastric antral ulcer. Gastric biopsies showed reactive gastropathy. There was no H pylori.    Previous cholecystectomy around 2011.  Current Outpatient Medications  Medication Sig Dispense Refill   acetaminophen  (TYLENOL ) 325 MG tablet Take 2 tablets (650 mg total) by  mouth every 6 (six) hours as needed. 30 tablet 0   amphetamine-dextroamphetamine (ADDERALL) 10 MG tablet Take 20 mg by mouth daily.     beclomethasone (QVAR) 40 MCG/ACT inhaler Inhale 2 puffs into the lungs.     Cariprazine  HCl 4.5 MG CAPS Take 4.5 mg by mouth daily.     clonazePAM  (KLONOPIN ) 1 MG tablet Take 1 mg by mouth 3 (three) times daily as needed for anxiety.      Galcanezumab -gnlm (EMGALITY ) 120 MG/ML SOAJ Inject 120mg  monthly. 1 mL 11   hyoscyamine  (LEVSIN ) 0.125 MG tablet Take 1 tablet (0.125 mg total) by mouth 3 (three) times daily as needed. 90 tablet 2   linaclotide  (LINZESS ) 72 MCG capsule Take 1 capsule (72 mcg total) by mouth daily before breakfast. 30 capsule 2   ondansetron  (ZOFRAN -ODT) 8 MG disintegrating tablet Take 1 tablet (8 mg total) by mouth every 8 (eight) hours as needed. For nausea. 20 tablet 11   pantoprazole  (PROTONIX ) 40 MG tablet Take 1 tablet (40 mg total) by mouth 2 (two) times daily. 180 tablet 3   primidone  (MYSOLINE ) 50 MG tablet Take 100 mg by mouth daily.     PROAIR  HFA 108 (90 Base) MCG/ACT inhaler SMARTSIG:1-2 Puff(s) Via Inhaler PRN     rizatriptan  (MAXALT -MLT) 10 MG disintegrating tablet Take 1 tablet (10 mg total) by mouth as needed for migraine. May repeat in 2 hours if needed 9 tablet 11   rOPINIRole  (REQUIP ) 3 MG tablet SMARTSIG:1 Tablet(s) By Mouth Every Evening  topiramate  (TOPAMAX ) 100 MG tablet Take 100 mg by mouth 2 (two) times daily.     traZODone  (DESYREL ) 100 MG tablet Take 50-150 mg by mouth at bedtime.     valACYclovir  (VALTREX ) 500 MG tablet Take 500 mg by mouth daily.     No current facility-administered medications for this visit.   Facility-Administered Medications Ordered in Other Visits  Medication Dose Route Frequency Provider Last Rate Last Admin   sodium chloride  flush (NS) 0.9 % injection 20 mL  20 mL Intravenous PRN Ines Paul B, MD   20 mL at 02/11/20 1301    Allergies as of 04/02/2024 - Review Complete 04/02/2024   Allergen Reaction Noted   Bee venom Anaphylaxis and Swelling 01/15/2015   Coconut flavoring agent (non-screening) Anaphylaxis 09/23/2014   Ibuprofen  Hives 07/22/2017   Naproxen  Nausea And Vomiting 05/03/2017   Tramadol  Other (See Comments) 11/20/2019    Past Medical History:  Diagnosis Date   Anxiety    Asthma    Bipolar 1 disorder (HCC)    Bipolar 1 disorder (HCC)    Cataract    left eye   Depression    Endometriosis    GERD (gastroesophageal reflux disease)    Mental disorder    Migraine     Past Surgical History:  Procedure Laterality Date   ABDOMINAL SURGERY     For endometriosis   CHOLECYSTECTOMY     HERNIA REPAIR     OOPHORECTOMY Right 08/30/2010   TUBAL LIGATION      Review of Systems:    All systems reviewed and negative except where noted in HPI.    Physical Exam:  BP 118/88   Pulse 97   Ht 5' 2 (1.575 m)   Wt 192 lb (87.1 kg)   BMI 35.12 kg/m  No LMP recorded. (Menstrual status: Bilateral Tubal Ligation).  General: Well-nourished, well-developed in no acute distress.  Lungs: Clear to auscultation bilaterally. Non-labored. Heart: Regular rate and rhythm, no murmurs rubs or gallops.  Abdomen: Bowel sounds are normal; Abdomen is Soft; No hepatosplenomegaly, masses or hernias;  No Abdominal Tenderness; No guarding or rebound tenderness. Neuro: Alert and oriented x 3.  Grossly intact.  Psych: Alert and cooperative, normal mood and affect.   Imaging Studies: No results found.  Labs: CBC    Component Value Date/Time   WBC 7.7 12/25/2023 2155   RBC 4.23 12/25/2023 2155   HGB 14.0 12/25/2023 2155   HCT 40.9 12/25/2023 2155   PLT 250 12/25/2023 2155   MCV 96.7 12/25/2023 2155   MCH 33.1 12/25/2023 2155   MCHC 34.2 12/25/2023 2155   RDW 13.9 12/25/2023 2155   LYMPHSABS 3.1 12/25/2023 2155   MONOABS 0.6 12/25/2023 2155   EOSABS 0.1 12/25/2023 2155   BASOSABS 0.0 12/25/2023 2155    CMP     Component Value Date/Time   NA 138 12/25/2023  2155   K 4.1 12/25/2023 2155   CL 111 12/25/2023 2155   CO2 21 (L) 12/25/2023 2155   GLUCOSE 96 12/25/2023 2155   BUN 10 12/25/2023 2155   CREATININE 0.78 12/25/2023 2155   CALCIUM 8.7 (L) 12/25/2023 2155   PROT 7.1 12/25/2023 2155   ALBUMIN 3.6 12/25/2023 2155   AST 13 (L) 12/25/2023 2155   ALT 16 12/25/2023 2155   ALKPHOS 68 12/25/2023 2155   BILITOT 0.3 12/25/2023 2155   GFRNONAA >60 12/25/2023 2155   GFRAA >60 01/08/2020 1951       Assessment and Plan:   Lyncoln L  Gruner is a 38 y.o. y/o female presents for follow-up of abdominal pain, GERD with esophagitis, erosive gastritis and irritable bowel syndrome with constipation.  Previous EGDs showed biopsies negative for H. pylori, EOE, and celiac.  Recent normal CBC, CMP, lipase labs.  Normal abdominal pelvic CT, abdominal x-ray, and pelvic ultrasound.  Cholecystectomy in 2011.  GI symptoms have improved on current treatment.   1.  Chronic lower abdominal pain - Refill Hyosciamine 0.125 mg 3 times daily as needed, #90, 5 refills.  2.  Irritable bowel syndrome, constipation predominant - Refill Linzess  daily, #90, 3 RF - Continue high-fiber diet and 64 ounces of fluids daily.  3.  Erosive Gastritis (H. pylori negative)  - Rx Pantoprazole  40mg   tablet daily, #90, 3 RF - Continue to avoid NSAIDs, spicy, and acidic foods.   Ellouise Console, PA-C  Follow up in 1 year or sooner if recurrent GI symptoms.

## 2024-04-02 ENCOUNTER — Encounter: Payer: Self-pay | Admitting: Physician Assistant

## 2024-04-02 ENCOUNTER — Ambulatory Visit (INDEPENDENT_AMBULATORY_CARE_PROVIDER_SITE_OTHER): Payer: MEDICAID | Admitting: Physician Assistant

## 2024-04-02 VITALS — BP 118/88 | HR 97 | Ht 62.0 in | Wt 192.0 lb

## 2024-04-02 DIAGNOSIS — K581 Irritable bowel syndrome with constipation: Secondary | ICD-10-CM | POA: Diagnosis not present

## 2024-04-02 DIAGNOSIS — K2100 Gastro-esophageal reflux disease with esophagitis, without bleeding: Secondary | ICD-10-CM

## 2024-04-02 DIAGNOSIS — K5904 Chronic idiopathic constipation: Secondary | ICD-10-CM

## 2024-04-02 DIAGNOSIS — R1030 Lower abdominal pain, unspecified: Secondary | ICD-10-CM

## 2024-04-02 DIAGNOSIS — R1084 Generalized abdominal pain: Secondary | ICD-10-CM

## 2024-04-02 DIAGNOSIS — K2960 Other gastritis without bleeding: Secondary | ICD-10-CM

## 2024-04-02 MED ORDER — HYOSCYAMINE SULFATE 0.125 MG PO TABS: 0.1250 mg | ORAL_TABLET | Freq: Three times a day (TID) | ORAL | 5 refills | Status: AC | PRN

## 2024-04-02 MED ORDER — LINACLOTIDE 72 MCG PO CAPS: 72.0000 ug | ORAL_CAPSULE | Freq: Every day | ORAL | 3 refills | Status: AC

## 2024-04-02 MED ORDER — PANTOPRAZOLE SODIUM 40 MG PO TBEC: 40.0000 mg | DELAYED_RELEASE_TABLET | Freq: Every day | ORAL | 3 refills | Status: AC

## 2024-04-02 NOTE — Patient Instructions (Signed)
 We have sent the following medications to your pharmacy for you to pick up at your convenience: Hyoscyamine  0.125 mg 3 times daily as needed, Linzess  72 mcg daily before breakfast and Pantoprazole  40 mg one daily  Please follow up sooner if symptoms increase or worsen  Due to recent changes in healthcare laws, you may see the results of your imaging and laboratory studies on MyChart before your provider has had a chance to review them.  We understand that in some cases there may be results that are confusing or concerning to you. Not all laboratory results come back in the same time frame and the provider may be waiting for multiple results in order to interpret others.  Please give us  48 hours in order for your provider to thoroughly review all the results before contacting the office for clarification of your results.   Thank you for trusting me with your gastrointestinal care!   Ellouise Console, PA-C _______________________________________________________  If your blood pressure at your visit was 140/90 or greater, please contact your primary care physician to follow up on this.  _______________________________________________________  If you are age 22 or older, your body mass index should be between 23-30. Your Body mass index is 35.12 kg/m. If this is out of the aforementioned range listed, please consider follow up with your Primary Care Provider.  If you are age 54 or younger, your body mass index should be between 19-25. Your Body mass index is 35.12 kg/m. If this is out of the aformentioned range listed, please consider follow up with your Primary Care Provider.   ________________________________________________________  The [facility] providers would like to encourage you to use MYCHART to communicate with providers for non-urgent requests or questions.  Due to long hold times on the telephone, sending your provider a message by [facility] may be a faster and more efficient way to get a  response.  Please allow 48 business hours for a response.  Please remember that this is for non-urgent requests.  _______________________________________________________

## 2024-04-17 NOTE — Progress Notes (Signed)
 Agree with the assessment and plan as outlined by Brigitte Canard, PA-C.

## 2024-04-28 ENCOUNTER — Other Ambulatory Visit: Payer: Self-pay | Admitting: Physician Assistant

## 2024-04-28 DIAGNOSIS — K581 Irritable bowel syndrome with constipation: Secondary | ICD-10-CM

## 2024-09-11 ENCOUNTER — Ambulatory Visit: Payer: MEDICAID | Admitting: Family Medicine

## 2024-09-11 ENCOUNTER — Encounter: Payer: Self-pay | Admitting: Family Medicine

## 2024-09-11 VITALS — BP 110/80 | Ht 63.0 in | Wt 190.5 lb

## 2024-09-11 DIAGNOSIS — G43709 Chronic migraine without aura, not intractable, without status migrainosus: Secondary | ICD-10-CM | POA: Diagnosis not present

## 2024-09-11 NOTE — Patient Instructions (Signed)
 Below is our plan:  We will continue rizatriptan  for abortive therapy. Continue topiramate  100mg  twice daily per your PCP.   Please make sure you are staying well hydrated. I recommend 50-60 ounces daily. Well balanced diet and regular exercise encouraged. Consistent sleep schedule with 6-8 hours recommended.   Please continue follow up with care team as directed.   Follow up with me in 1 year   You may receive a survey regarding today's visit. I encourage you to leave honest feed back as I do use this information to improve patient care. Thank you for seeing me today!   GENERAL HEADACHE INFORMATION:   Natural supplements: Magnesium  Oxide or Magnesium  Glycinate 500 mg at bed (up to 800 mg daily) Coenzyme Q10 300 mg in AM Vitamin B2- 200 mg twice a day   Add 1 supplement at a time since even natural supplements can have undesirable side effects. You can sometimes buy supplements cheaper (especially Coenzyme Q10) at www.webmailguide.co.za or at [facility].  Migraine with aura: There is increased risk for stroke in women with migraine with aura and a contraindication for the combined contraceptive pill for use by women who have migraine with aura. The risk for women with migraine without aura is lower. However other risk factors like smoking are far more likely to increase stroke risk than migraine. There is a recommendation for no smoking and for the use of OCPs without estrogen such as progestogen only pills particularly for women with migraine with aura.SABRA People who have migraine headaches with auras may be 3 times more likely to have a stroke caused by a blood clot, compared to migraine patients who don't see auras. Women who take hormone-replacement therapy may be 30 percent more likely to suffer a clot-based stroke than women not taking medication containing estrogen. Other risk factors like smoking and high blood pressure may be  much more important.    Vitamins and herbs that show potential:    Magnesium : Magnesium  (250 mg twice a day or 500 mg at bed) has a relaxant effect on smooth muscles such as blood vessels. Individuals suffering from frequent or daily headache usually have low magnesium  levels which can be increase with daily supplementation of 400-750 mg. Three trials found 40-90% average headache reduction  when used as a preventative. Magnesium  may help with headaches are aura, the best evidence for magnesium  is for migraine with aura is its thought to stop the cortical spreading depression we believe is the pathophysiology of migraine aura.Magnesium  also demonstrated the benefit in menstrually related migraine.  Magnesium  is part of the messenger system in the serotonin cascade and it is a good muscle relaxant.  It is also useful for constipation which can be a side effect of other medications used to treat migraine. Good sources include nuts, whole grains, and tomatoes. Side Effects: loose stool/diarrhea  Riboflavin (vitamin B 2) 200 mg twice a day. This vitamin assists nerve cells in the production of ATP a principal energy storing molecule.  It is necessary for many chemical reactions in the body.  There have been at least 3 clinical trials of riboflavin using 400 mg per day all of which suggested that migraine frequency can be decreased.  All 3 trials showed significant improvement in over half of migraine sufferers.  The supplement is found in bread, cereal, milk, meat, and poultry.  Most Americans get more riboflavin than the recommended daily allowance, however riboflavin deficiency is not necessary for the supplements to help prevent headache. Side effects:  energizing, green urine   Coenzyme Q10: This is present in almost all cells in the body and is critical component for the conversion of energy.  Recent studies have shown that a nutritional supplement of CoQ10 can reduce the frequency of migraine attacks by improving the energy production of cells as with riboflavin.  Doses of  150 mg twice a day have been shown to be effective.   Melatonin: Increasing evidence shows correlation between melatonin secretion and headache conditions.  Melatonin supplementation has decreased headache intensity and duration.  It is widely used as a sleep aid.  Sleep is natures way of dealing with migraine.  A dose of 3 mg is recommended to start for headaches including cluster headache. Higher doses up to 15 mg has been reviewed for use in Cluster headache and have been used. The rationale behind using melatonin for cluster is that many theories regarding the cause of Cluster headache center around the disruption of the normal circadian rhythm in the brain.  This helps restore the normal circadian rhythm.   HEADACHE DIET: Foods and beverages which may trigger migraine Note that only 20% of headache patients are food sensitive. You will know if you are food sensitive if you get a headache consistently 20 minutes to 2 hours after eating a certain food. Only cut out a food if it causes headaches, otherwise you might remove foods you enjoy! What matters most for diet is to eat a well balanced healthy diet full of vegetables and low fat protein, and to not miss meals.   Chocolate, other sweets ALL cheeses except cottage and cream cheese Dairy products, yogurt, sour cream, ice cream Liver Meat extracts (Bovril, Marmite, meat tenderizers) Meats or fish which have undergone aging, fermenting, pickling or smoking. These include: Hotdogs,salami,Lox,sausage, mortadellas,smoked salmon, pepperoni, Pickled herring Pods of broad bean (English beans, Chinese pea pods, Italian (fava) beans, lima and navy beans Ripe avocado, ripe banana Yeast extracts or active yeast preparations such as Brewer's or Fleishman's (commercial bakes goods are permitted) Tomato based foods, pizza (lasagna, etc.)   MSG (monosodium glutamate) is disguised as many things; look for these common aliases: Monopotassium  glutamate Autolysed yeast Hydrolysed protein Sodium caseinate flavorings all natural preservatives Nutrasweet   Avoid all other foods that convincingly provoke headaches.   Resources: The Dizzy Bluford Aid Your Headache Diet, migrainestrong.com  https://zamora-andrews.com/   Caffeine and Migraine For patients that have migraine, caffeine intake more than 3 days per week can lead to dependency and increased migraine frequency. I would recommend cutting back on your caffeine intake as best you can. The recommended amount of caffeine is 200-300 mg daily, although migraine patients may experience dependency at even lower doses. While you may notice an increase in headache temporarily, cutting back will be helpful for headaches in the long run. For more information on caffeine and migraine, visit: https://americanmigrainefoundation.org/resource-library/caffeine-and-migraine/   Headache Prevention Strategies:   1. Maintain a headache diary; learn to identify and avoid triggers.  - This can be a simple note where you log when you had a headache, associated symptoms, and medications used - There are several smartphone apps developed to help track migraines: Migraine Buddy, Migraine Monitor, Curelator N1-Headache App   Common triggers include: Emotional triggers: Emotional/Upset family or friends Emotional/Upset occupation Business reversal/success Anticipation anxiety Crisis-serious Post-crisis periodNew job/position   Physical triggers: Vacation Day Weekend Strenuous Exercise High Altitude Location New Move Menstrual Day Physical Illness Oversleep/Not enough sleep Weather changes Light: Photophobia or light sesnitivity treatment involves a  balance between desensitization and reduction in overly strong input. Use dark polarized glasses outside, but not inside. Avoid bright or fluorescent light, but do not dim environment to the point  that going into a normally lit room hurts. Consider FL-41 tint lenses, which reduce the most irritating wavelengths without blocking too much light.  These can be obtained at axonoptics.com or theraspecs.com Foods: see list above.   2. Limit use of acute treatments (over-the-counter medications, triptans, etc.) to no more than 2 days per week or 10 days per month to prevent medication overuse headache (rebound headache).     3. Follow a regular schedule (including weekends and holidays): Don't skip meals. Eat a balanced diet. 8 hours of sleep nightly. Minimize stress. Exercise 30 minutes per day. Being overweight is associated with a 5 times increased risk of chronic migraine. Keep well hydrated and drink 6-8 glasses of water per day.   4. Initiate non-pharmacologic measures at the earliest onset of your headache. Rest and quiet environment. Relax and reduce stress. Breathe2Relax is a free app that can instruct you on    some simple relaxtion and breathing techniques. Http://Dawnbuse.com is a    free website that provides teaching videos on relaxation.  Also, there are  many apps that   can be downloaded for mindful relaxation.  An app called YOGA NIDRA will help walk you through mindfulness. Another app called Calm can be downloaded to give you a structured mindfulness guide with daily reminders and skill development. Headspace for guided meditation Mindfulness Based Stress Reduction Online Course: www.palousemindfulness.com Cold compresses.   5. Don't wait!! Take the maximum allowable dosage of prescribed medication at the first sign of migraine.   6. Compliance:  Take prescribed medication regularly as directed and at the first sign of a migraine.   7. Communicate:  Call your physician when problems arise, especially if your headaches change, increase in frequency/severity, or become associated with neurological symptoms (weakness, numbness, slurred speech, etc.). Proceed to emergency room  if you experience new or worsening symptoms or symptoms do not resolve, if you have new neurologic symptoms or if headache is severe, or for any concerning symptom.   8. Headache/pain management therapies: Consider various complementary methods, including medication, behavioral therapy, psychological counselling, biofeedback, massage therapy, acupuncture, dry needling, and other modalities.  Such measures may reduce the need for medications. Counseling for pain management, where patients learn to function and ignore/minimize their pain, seems to work very well.   9. Recommend changing family's attention and focus away from patient's headaches. Instead, emphasize daily activities. If first question of day is 'How are your headaches/Do you have a headache today?', then patient will constantly think about headaches, thus making them worse. Goal is to re-direct attention away from headaches, toward daily activities and other distractions.   10. Helpful Websites: www.AmericanHeadacheSociety.org patenthood.ch www.headaches.org tightmarket.nl www.achenet.org

## 2024-09-11 NOTE — Progress Notes (Signed)
 "   PATIENT: [name] DOB: August 04, 1986  REASON FOR VISIT: follow up HISTORY FROM: patient  Chief Complaint  Patient presents with   RM/MIGRAINES    Pt is here with her Mother. Pt states her migraines have been doing okay. Pt states that her insurance won't cover her Emgality .      HISTORY OF PRESENT ILLNESS:  09/11/2024 ALL: Tiffini returns for follow up for migraines. She was last seen by Dr Ines 01/2024. She was started on Emgality  and rizatriptan . She reports taking one injection but did not continue due to cost. She could not afford the 30 dollar copay. She continues topiramate  100mg  twice daily through PCP. She reports migraines are well managed. She may have 1-2 per month. Rizatriptan  works well for abortive therapy. She sleeps well. She does not drink as much water as she should. She has not eaten well, recently. Unclear why.   02/06/2024 AA: HPI:  [name] is a 39 y.o. female here as requested by Juliane Che, PA for migraines. has Major depressive disorder, recurrent episode, severe (HCC); Bipolar affective disorder, depressed, severe (HCC); Bipolar disorder with depression (HCC); MDD (major depressive disorder), recurrent episode, severe (HCC); MDD (major depressive disorder); Pelvic pain in female; Severe recurrent major depression with psychotic features (HCC); Substance use disorder; Bipolar 1 disorder, depressed, severe (HCC); Hiatal hernia; and Chronic migraine without aura without status migrainosus, not intractable on their problem list.ADHD, HSV-1/HSV-2, asthma, rls.    Here with mother. She is on Topamax  for so long and it isn;t really working. She was on Aimovig  a long time ago and doesn't remember why she stopped. But the shot helped. Her headaches worsening, she has been to the chiropractor for her neck, that isn't helping. Migraines start in the back of her neck at the base of the skull, both sides and radiates up to the forehead. She has light  sensitivity, sound sensitivity, she wants to be in a dark room and sleep which helps, nausea, bilateral, no vomiting, throbbing can be moderate to severe. She can have at least Migraines can last 2-3 days and has 4 migraine days a month for an average of 10 migraine days a month. She has some sort of headache every day of the month. Ongoing > 1 year. Symptoms have not changed, not positional , not exertional, no new vision changes. Becoming more frequent in the last year. No aura. No aura.  Mother and grandmother with migraines. No other focal neurologic deficits, associated symptoms, inciting events or modifiable factors.   Reviewed notes, labs and imaging from outside physicians, which showed:   From a thorough review of records and patient report, Medications tried that can be used in migraine/headache management greater than 3 months include: Lifestyle modification, headache diaries, better sleep hygiene, exercise, management of migraine triggers, OTC and prescribed analgesics/nsaids such as ibuprofen , excedrin, alleve and others, topamax , trazodone , flexeril, propranolol and other beta-blockers contraindicated due to asthma, baclofen, carbamazepine , Celexa , Depakote , doxepin , Cymbalta , Aimovig , Lexapro , Prozac ,Gabapentin , Lamictal , Robaxin , Reglan , however she has tried propranolol as well Seroquel , Nurtec, Risperdal , Zoloft , Effexor , amitriptyline/nortriptyline or other TCAs are contraindicated because patient is currently on trazodone  and the combination is a risk factor for serotonin syndrome.Nurtec helped in the past. Takes tylenol  for migraines but no medication overuse  about 8-10x a month.   CT head, reviewed images and agree:  05/04/2023: FINDINGS: Brain: There is no evidence of an acute infarct, intracranial hemorrhage, mass, midline shift, or extra-axial fluid collection. The ventricles and  sulci are unchanged and normal.   Vascular: No hyperdense vessel.   Skull: No acute fracture or  suspicious osseous lesion.   Sinuses/Orbits: Visualized paranasal sinuses are clear. Trace left mastoid fluid. Left cataract extraction.   Other: None.   IMPRESSION: No evidence of acute intracranial abnormality.   12/25/2023: CBC nml, CMP unremarkable slightly decr co2,calcium and ast  04/16/2020 ALL:  [name] is a 39 y.o. female here today for follow up for headaches. MRI and echo normal. She was started on Amovig monthly and Nurtec for acute management at last visit in 12/2019. She also continues topiramate  100mg  BID (prescribed by PCP). She reports that headaches have improved significantly. She has taken Nurtec about 2-3 times per month. Migraine resolves within 2 hours of taking Nurtec. She denies any unusual events of numbness or confusion. She denies visual disturbances the precede headache.   HISTORY: (copied from Dr Sharion note on 01/11/2020)  HPI:  [name] is a 39 y.o. female here as requested by [facility] for complicated migraine.  Past medical history anxiety, asthma, bipolar 1 disorder, cataract left eye, depression, GERD.  I reviewed Ozell Poisson MD's notes: Patient presented to the emergency room on Jan 08, 2020,  she presented with chest pain and explained that she went to bed at 3 AM and woke up at 5:30 AM and noted the left side of her body felt numb or strange, she also experienced a headache, the numbness resolved after few hours but the remainder of the day she began experiencing sharp chest pain, constant, worse with breathing and continued headache, no neck pain or stiffness, no fever no IV drug use no bladder dysfunction, no abdominal pain, pain moderate to severe and constant.  She is a current every day smoker 1.25 pack years, she denied alcohol use but endorsed drug use including marijuana and barbiturates.  Her laboratory values showed her potassium was slightly decreased at 3.3, her white blood cells were elevated at 11.7 with  slightly increased hemoglobin as well 15.4, anion gap 16, glucose slightly elevated at 131, all else unremarkable.  She was given fluids, Benadryl  injection, Compazine  injection.  The physical and neurological exam were completely normal favoring complex migraine, D-dimer was negative, chest x-ray with no evidence of acute etiology and she felt better after the migraine cocktail.  CT of the head was normal.  She does have a long history of headaches and she was referred to neurology.   She is here alone. She has been having real bad headaches for months. They have been getting worse. She woke up the other night about 530 and she was numb on one side the face, the left arm and leg, her grandmother noticed, she felt weird, her face is drooping, drooping lasted 4 hours, the numbness lasted an hour, she had a headache at the time, not a migraine but a weird headache, this was not a common experience. She has severe migraines, photophobia/phonophobia, unilateral and spread bilaterally behind the eyes and then to the back of her head down through her neck. Pulsating/pounding/throbing. Daily headaches, 15 migraine days a month, they can last 12-24 hours.    Reviewed notes, labs and imaging from outside physicians, which showed:   Meds tried that can be used in migraine prevention/treatment: Propranolol, baclofen, zofran , topiramate , trazodone , maxalt , zofran , gabapentin , tylenol , can't take ibuprofen , tegretol , celexa , lamictal , robaxin , reglan , metoprolol , prednisone , sertraline , tramadol , effexor , imitrex,    01/08/2020:    Ct showed Normal CT head;  No acute intracranial abnormalities including mass lesion or mass effect, hydrocephalus, extra-axial fluid collection, midline shift, hemorrhage, or acute infarction, large ischemic events (personally reviewed images)    REVIEW OF SYSTEMS: Out of a complete 14 system review of symptoms, the patient complains only of the following symptoms, headaches and all  other reviewed systems are negative.   ALLERGIES: Allergies  Allergen Reactions   Bee Venom Anaphylaxis and Swelling   Coconut Flavoring Agent (Non-Screening) Anaphylaxis   Ibuprofen  Hives   Naproxen  Nausea And Vomiting   Tramadol  Other (See Comments)    unk Other reaction(s): Other unk    HOME MEDICATIONS: Outpatient Medications Prior to Visit  Medication Sig Dispense Refill   acetaminophen  (TYLENOL ) 325 MG tablet Take 2 tablets (650 mg total) by mouth every 6 (six) hours as needed. 30 tablet 0   amphetamine-dextroamphetamine (ADDERALL) 10 MG tablet Take 20 mg by mouth daily.     beclomethasone (QVAR) 40 MCG/ACT inhaler Inhale 2 puffs into the lungs.     Cariprazine  HCl 4.5 MG CAPS Take 4.5 mg by mouth daily.     clonazePAM  (KLONOPIN ) 1 MG tablet Take 1 mg by mouth 3 (three) times daily as needed for anxiety.      hyoscyamine  (LEVSIN ) 0.125 MG tablet Take 1 tablet (0.125 mg total) by mouth 3 (three) times daily as needed. 90 tablet 5   linaclotide  (LINZESS ) 72 MCG capsule Take 1 capsule (72 mcg total) by mouth daily before breakfast. 90 capsule 3   pantoprazole  (PROTONIX ) 40 MG tablet Take 1 tablet (40 mg total) by mouth daily. 90 tablet 3   primidone  (MYSOLINE ) 50 MG tablet Take 100 mg by mouth daily.     PROAIR  HFA 108 (90 Base) MCG/ACT inhaler SMARTSIG:1-2 Puff(s) Via Inhaler PRN     rizatriptan  (MAXALT -MLT) 10 MG disintegrating tablet Take 1 tablet (10 mg total) by mouth as needed for migraine. May repeat in 2 hours if needed 9 tablet 11   rOPINIRole  (REQUIP ) 3 MG tablet SMARTSIG:1 Tablet(s) By Mouth Every Evening     topiramate  (TOPAMAX ) 100 MG tablet Take 100 mg by mouth 2 (two) times daily.     valACYclovir  (VALTREX ) 500 MG tablet Take 500 mg by mouth daily.     ondansetron  (ZOFRAN -ODT) 8 MG disintegrating tablet Take 1 tablet (8 mg total) by mouth every 8 (eight) hours as needed. For nausea. (Patient not taking: Reported on 09/11/2024) 20 tablet 11   Galcanezumab -gnlm  (EMGALITY ) 120 MG/ML SOAJ Inject 120mg  monthly. (Patient not taking: Reported on 09/11/2024) 1 mL 11   traZODone  (DESYREL ) 100 MG tablet Take 50-150 mg by mouth at bedtime. (Patient not taking: Reported on 09/11/2024)     Facility-Administered Medications Prior to Visit  Medication Dose Route Frequency Provider Last Rate Last Admin   sodium chloride  flush (NS) 0.9 % injection 20 mL  20 mL Intravenous PRN Ines Onetha NOVAK, MD   20 mL at 02/11/20 1301    PAST MEDICAL HISTORY: Past Medical History:  Diagnosis Date   Anxiety    Asthma    Bipolar 1 disorder (HCC)    Bipolar 1 disorder (HCC)    Cataract    left eye   Depression    Endometriosis    GERD (gastroesophageal reflux disease)    Mental disorder    Migraine     PAST SURGICAL HISTORY: Past Surgical History:  Procedure Laterality Date   ABDOMINAL SURGERY     For endometriosis   CHOLECYSTECTOMY     HERNIA REPAIR  OOPHORECTOMY Right 08/30/2010   TUBAL LIGATION      FAMILY HISTORY: Family History  Problem Relation Age of Onset   Stroke Mother    Atrial fibrillation Mother    Migraines Maternal Grandmother    Hyperlipidemia Other    Colon cancer Neg Hx    Stomach cancer Neg Hx    Pancreatic cancer Neg Hx    Esophageal cancer Neg Hx     SOCIAL HISTORY: Social History   Socioeconomic History   Marital status: Legally Separated    Spouse name: Not on file   Number of children: 2   Years of education: Not on file   Highest education level: Not on file  Occupational History   Not on file  Tobacco Use   Smoking status: Every Day    Current packs/day: 0.25    Average packs/day: 0.3 packs/day for 5.0 years (1.3 ttl pk-yrs)    Types: Cigarettes   Smokeless tobacco: Never  Vaping Use   Vaping status: Every Day   Substances: Nicotine , Flavoring  Substance and Sexual Activity   Alcohol use: Never   Drug use: Not Currently    Types: Marijuana    Comment: last use 11 oct 22   Sexual activity: Yes     Partners: Male    Birth control/protection: Surgical  Other Topics Concern   Not on file  Social History Narrative   Right handed   Caffeine: tea 2-3 times a week    Social Drivers of Health   Tobacco Use: High Risk (09/11/2024)   Patient History    Smoking Tobacco Use: Every Day    Smokeless Tobacco Use: Never    Passive Exposure: Not on file  Financial Resource Strain: Low Risk (07/02/2024)   Received from [facility]   Overall Financial Resource Strain (CARDIA)    How hard is it for you to pay for the very basics like food, housing, medical care, and heating?: Not hard at all  Food Insecurity: No Food Insecurity (07/02/2024)   Received from [facility]   Epic    Within the past 12 months, you worried that your food would run out before you got the money to buy more.: Never true    Within the past 12 months, the food you bought just didn't last and you didn't have money to get more.: Never true  Transportation Needs: No Transportation Needs (07/02/2024)   Received from [facility]    In the past 12 months, has lack of transportation kept you from medical appointments or from getting medications?: No    In the past 12 months, has lack of transportation kept you from meetings, work, or from getting things needed for daily living?: No  Physical Activity: Inactive (07/02/2024)   Received from [facility]   Exercise Vital Sign    On average, how many days per week do you engage in moderate to strenuous exercise (like a brisk walk)?: 0 days    Minutes of Exercise per Session: Not on file  Stress: No Stress Concern Present (07/02/2024)   Received from [facility] - Occupational Stress Questionnaire    Do you feel stress - tense, restless, nervous, or anxious, or unable to sleep at night because your mind is troubled all the time - these days?: Not at all  Social Connections: Socially Integrated (07/02/2024)   Received from [facility]   Social Network    How would you  rate your social network (family, work, friends)?: Good participation with social networks  Intimate Partner Violence: Not At Risk (07/02/2024)   Received from [facility]   HITS    Over the last 12 months how often did your partner physically hurt you?: Never    Over the last 12 months how often did your partner insult you or talk down to you?: Never    Over the last 12 months how often did your partner threaten you with physical harm?: Never    Over the last 12 months how often did your partner scream or curse at you?: Never  Depression (PHQ2-9): Not on file  Alcohol Screen: Not on file  Housing: Low Risk (07/02/2024)   Received from [facility]    In the last 12 months, was there a time when you were not able to pay the mortgage or rent on time?: No    In the past 12 months, how many times have you moved where you were living?: 1    At any time in the past 12 months, were you homeless or living in a shelter (including now)?: No  Utilities: Not At Risk (07/02/2024)   [facility]    In the past 12 months has the electric, gas, oil, or water company threatened to shut off services in your home?: No  Health Literacy: Not on file      PHYSICAL EXAM  Vitals:   09/11/24 1002 09/11/24 1016  BP:  110/80  Weight: 190 lb 8 oz (86.4 kg)   Height: 5' 3 (1.6 m)     Body mass index is 33.75 kg/m.  Generalized: Well developed, in no acute distress  Cardiology: normal rate and rhythm, no murmur noted Respiratory: clear to auscultation bilaterally  Neurological examination  Mentation: Alert oriented to time, place, history taking. Follows all commands speech and language fluent Cranial nerve II-XII: Pupils were equal round reactive to light. Extraocular movements were full, visual field were full on confrontational test. Facial sensation and strength were normal. Uvula tongue midline. Head turning and shoulder shrug   were normal and symmetric. Motor: The motor testing reveals 5 over 5 strength of all 4 extremities. Good symmetric motor tone is noted throughout.  Gait and station: Gait is normal.   DIAGNOSTIC DATA (LABS, IMAGING, TESTING) - I reviewed patient records, labs, notes, testing and imaging myself where available.      No data to display           Lab Results  Component Value Date   WBC 7.7 12/25/2023   HGB 14.0 12/25/2023   HCT 40.9 12/25/2023   MCV 96.7 12/25/2023   PLT 250 12/25/2023      Component Value Date/Time   NA 138 12/25/2023 2155   K 4.1 12/25/2023 2155   CL 111 12/25/2023 2155   CO2 21 (L) 12/25/2023 2155   GLUCOSE 96 12/25/2023 2155   BUN 10 12/25/2023 2155   CREATININE 0.78 12/25/2023 2155   CALCIUM 8.7 (L) 12/25/2023 2155   PROT 7.1 12/25/2023 2155   ALBUMIN 3.6 12/25/2023 2155   AST 13 (L) 12/25/2023 2155   ALT 16 12/25/2023 2155   ALKPHOS 68 12/25/2023 2155   BILITOT 0.3 12/25/2023 2155   GFRNONAA >60 12/25/2023 2155   GFRAA >60 01/08/2020 1951   Lab Results  Component Value Date   CHOL 191 01/11/2020   HDL 67 01/11/2020   LDLCALC 93 01/11/2020   TRIG  184 (H) 01/11/2020   CHOLHDL 2.9 01/11/2020   Lab Results  Component Value Date   HGBA1C 5.3 01/11/2020   No results found for: VITAMINB12 Lab Results  Component Value Date   TSH 2.840 03/13/2014       ASSESSMENT AND PLAN 39 y.o. year old female  has a past medical history of Anxiety, Asthma, Bipolar 1 disorder (HCC), Bipolar 1 disorder (HCC), Cataract, Depression, Endometriosis, GERD (gastroesophageal reflux disease), Mental disorder, and Migraine. here with     ICD-10-CM   1. Chronic migraine without aura without status migrainosus, not intractable  G43.709        Nashya is doing very well. She will continue topiramate  100 mg twice daily as advised by her PCP.  We will continue rizatritpan for abortive therapy.  She was encouraged to focus on healthy lifestyle habits.  She will  follow-up with us  in 1 year.  She verbalizes understanding and agreement with this plan.   No orders of the defined types were placed in this encounter.    No orders of the defined types were placed in this encounter.     Greig Forbes, FNP-C 09/11/2024, 10:21 AM [facility] [address] [place], [place] [address] [phone]    "

## 2024-09-27 ENCOUNTER — Other Ambulatory Visit: Payer: Self-pay

## 2025-09-17 ENCOUNTER — Ambulatory Visit: Payer: Self-pay | Admitting: Family Medicine
